# Patient Record
Sex: Female | Born: 1935
Health system: Southern US, Community
[De-identification: ages and names within clinical notes are randomized; demographics above are authoritative.]

## PROBLEM LIST (undated history)

## (undated) DIAGNOSIS — I739 Peripheral vascular disease, unspecified: Secondary | ICD-10-CM

## (undated) DIAGNOSIS — D869 Sarcoidosis, unspecified: Secondary | ICD-10-CM

## (undated) DIAGNOSIS — I700 Atherosclerosis of aorta: Secondary | ICD-10-CM

## (undated) DIAGNOSIS — M170 Bilateral primary osteoarthritis of knee: Secondary | ICD-10-CM

## (undated) DIAGNOSIS — K219 Gastro-esophageal reflux disease without esophagitis: Secondary | ICD-10-CM

## (undated) DIAGNOSIS — G4733 Obstructive sleep apnea (adult) (pediatric): Secondary | ICD-10-CM

## (undated) DIAGNOSIS — I10 Essential (primary) hypertension: Secondary | ICD-10-CM

## (undated) DIAGNOSIS — I509 Heart failure, unspecified: Secondary | ICD-10-CM

## (undated) DIAGNOSIS — F419 Anxiety disorder, unspecified: Secondary | ICD-10-CM

## (undated) DIAGNOSIS — H9190 Unspecified hearing loss, unspecified ear: Secondary | ICD-10-CM

## (undated) DIAGNOSIS — G629 Polyneuropathy, unspecified: Secondary | ICD-10-CM

## (undated) DIAGNOSIS — E041 Nontoxic single thyroid nodule: Secondary | ICD-10-CM

## (undated) DIAGNOSIS — J301 Allergic rhinitis due to pollen: Secondary | ICD-10-CM

## (undated) DIAGNOSIS — I2510 Atherosclerotic heart disease of native coronary artery without angina pectoris: Secondary | ICD-10-CM

## (undated) DIAGNOSIS — R0600 Dyspnea, unspecified: Secondary | ICD-10-CM

## (undated) HISTORY — DX: Allergic rhinitis due to pollen: J30.1

## (undated) HISTORY — DX: Sarcoidosis, unspecified: D86.9

## (undated) HISTORY — DX: Polyneuropathy, unspecified: G62.9

## (undated) HISTORY — DX: Heart failure, unspecified: I50.9

## (undated) HISTORY — DX: Nontoxic single thyroid nodule: E04.1

## (undated) HISTORY — PX: ABDOMINAL HYSTERECTOMY: SHX81

## (undated) HISTORY — DX: Atherosclerotic heart disease of native coronary artery without angina pectoris: I25.10

## (undated) HISTORY — DX: Gastro-esophageal reflux disease without esophagitis: K21.9

## (undated) HISTORY — DX: Atherosclerosis of aorta: I70.0

## (undated) HISTORY — DX: Bilateral primary osteoarthritis of knee: M17.0

## (undated) HISTORY — DX: Obstructive sleep apnea (adult) (pediatric): G47.33

## (undated) HISTORY — DX: Unspecified hearing loss, unspecified ear: H91.90

## (undated) HISTORY — DX: Essential (primary) hypertension: I10

## (undated) HISTORY — PX: BREAST EXCISIONAL BIOPSY: SUR124

## (undated) HISTORY — DX: Peripheral vascular disease, unspecified: I73.9

---

## 1998-12-30 HISTORY — PX: JOINT REPLACEMENT: SHX530

## 2004-12-30 HISTORY — PX: CORONARY ANGIOPLASTY: SHX604

## 2005-01-27 ENCOUNTER — Emergency Department: Payer: Self-pay | Admitting: Emergency Medicine

## 2005-01-31 ENCOUNTER — Ambulatory Visit: Payer: Self-pay

## 2005-05-02 ENCOUNTER — Ambulatory Visit: Payer: Self-pay

## 2005-06-26 ENCOUNTER — Ambulatory Visit: Payer: Self-pay | Admitting: Gastroenterology

## 2005-12-30 HISTORY — PX: JOINT REPLACEMENT: SHX530

## 2006-03-13 ENCOUNTER — Ambulatory Visit: Payer: Self-pay | Admitting: Ophthalmology

## 2006-03-19 ENCOUNTER — Ambulatory Visit: Payer: Self-pay | Admitting: Ophthalmology

## 2006-05-29 ENCOUNTER — Ambulatory Visit: Payer: Self-pay | Admitting: Ophthalmology

## 2006-06-04 ENCOUNTER — Ambulatory Visit: Payer: Self-pay | Admitting: Ophthalmology

## 2006-06-13 ENCOUNTER — Ambulatory Visit: Payer: Self-pay | Admitting: Internal Medicine

## 2006-08-07 ENCOUNTER — Ambulatory Visit: Payer: Self-pay | Admitting: Specialist

## 2007-02-13 ENCOUNTER — Ambulatory Visit: Payer: Self-pay | Admitting: Ophthalmology

## 2007-06-10 ENCOUNTER — Ambulatory Visit: Payer: Self-pay | Admitting: Gastroenterology

## 2008-03-22 ENCOUNTER — Ambulatory Visit: Payer: Self-pay | Admitting: Internal Medicine

## 2008-05-04 ENCOUNTER — Ambulatory Visit: Payer: Self-pay | Admitting: Internal Medicine

## 2008-10-11 ENCOUNTER — Ambulatory Visit: Payer: Self-pay | Admitting: Specialist

## 2008-11-16 ENCOUNTER — Ambulatory Visit: Payer: Self-pay | Admitting: Specialist

## 2008-11-23 ENCOUNTER — Inpatient Hospital Stay: Payer: Self-pay | Admitting: Specialist

## 2009-01-06 ENCOUNTER — Ambulatory Visit: Payer: Self-pay | Admitting: Internal Medicine

## 2009-01-25 ENCOUNTER — Ambulatory Visit: Payer: Self-pay | Admitting: Specialist

## 2009-04-26 ENCOUNTER — Ambulatory Visit: Payer: Self-pay | Admitting: Internal Medicine

## 2009-12-07 ENCOUNTER — Emergency Department: Payer: Self-pay | Admitting: Emergency Medicine

## 2010-03-01 ENCOUNTER — Ambulatory Visit: Payer: Self-pay | Admitting: Internal Medicine

## 2010-07-12 ENCOUNTER — Ambulatory Visit: Payer: Self-pay | Admitting: Internal Medicine

## 2011-04-17 LAB — PULMONARY FUNCTION TEST

## 2011-11-05 ENCOUNTER — Ambulatory Visit: Payer: Self-pay | Admitting: Internal Medicine

## 2011-11-08 ENCOUNTER — Ambulatory Visit: Payer: Self-pay | Admitting: Otolaryngology

## 2011-11-12 ENCOUNTER — Ambulatory Visit: Payer: Self-pay | Admitting: Internal Medicine

## 2011-12-10 ENCOUNTER — Ambulatory Visit: Payer: Self-pay | Admitting: Otolaryngology

## 2011-12-22 ENCOUNTER — Emergency Department: Payer: Self-pay | Admitting: Internal Medicine

## 2012-02-13 ENCOUNTER — Ambulatory Visit: Payer: Self-pay | Admitting: Gastroenterology

## 2012-02-14 LAB — PATHOLOGY REPORT

## 2012-03-03 LAB — PULMONARY FUNCTION TEST

## 2012-05-12 HISTORY — PX: CARDIOVASCULAR STRESS TEST: SHX262

## 2012-07-21 ENCOUNTER — Emergency Department: Payer: Self-pay | Admitting: Emergency Medicine

## 2012-12-17 ENCOUNTER — Emergency Department: Payer: Self-pay | Admitting: Emergency Medicine

## 2012-12-17 LAB — COMPREHENSIVE METABOLIC PANEL
Albumin: 3.7 g/dL (ref 3.4–5.0)
Alkaline Phosphatase: 93 U/L (ref 50–136)
Anion Gap: 6 — ABNORMAL LOW (ref 7–16)
BUN: 10 mg/dL (ref 7–18)
Bilirubin,Total: 0.5 mg/dL (ref 0.2–1.0)
Calcium, Total: 8.8 mg/dL (ref 8.5–10.1)
Chloride: 110 mmol/L — ABNORMAL HIGH (ref 98–107)
Co2: 25 mmol/L (ref 21–32)
Creatinine: 0.91 mg/dL (ref 0.60–1.30)
EGFR (African American): >60
EGFR (Non-African Amer.): 57 — ABNORMAL LOW
Glucose: 122 mg/dL — ABNORMAL HIGH (ref 65–99)
Osmolality: 282 (ref 275–301)
Potassium: 3.8 mmol/L (ref 3.5–5.1)
SGOT(AST): 20 U/L (ref 15–37)
SGPT (ALT): 16 U/L (ref 12–78)
Sodium: 141 mmol/L (ref 136–145)
Total Protein: 7.2 g/dL (ref 6.4–8.2)

## 2012-12-17 LAB — CBC
HCT: 42.9 % (ref 35.0–47.0)
HGB: 14.5 g/dL (ref 12.0–16.0)
MCH: 33.1 pg (ref 26.0–34.0)
MCHC: 33.9 g/dL (ref 32.0–36.0)
MCV: 98 fL (ref 80–100)
Platelet: 188 10*3/uL (ref 150–440)
RBC: 4.4 10*6/uL (ref 3.80–5.20)
RDW: 13.9 % (ref 11.5–14.5)
WBC: 6.3 10*3/uL (ref 3.6–11.0)

## 2012-12-17 LAB — APTT: Activated PTT: 26.4 secs (ref 23.6–35.9)

## 2012-12-17 LAB — TROPONIN I: Troponin-I: <0.02 ng/mL

## 2012-12-30 HISTORY — PX: BIOPSY THYROID: PRO38

## 2013-03-09 ENCOUNTER — Ambulatory Visit: Payer: Self-pay | Admitting: Internal Medicine

## 2013-05-05 ENCOUNTER — Ambulatory Visit: Payer: Self-pay | Admitting: Gastroenterology

## 2013-05-05 LAB — HM COLONOSCOPY

## 2013-05-06 LAB — PATHOLOGY REPORT

## 2013-07-07 ENCOUNTER — Ambulatory Visit: Payer: Self-pay | Admitting: General Practice

## 2013-10-13 ENCOUNTER — Encounter: Payer: Self-pay | Admitting: *Deleted

## 2013-10-27 ENCOUNTER — Ambulatory Visit (INDEPENDENT_AMBULATORY_CARE_PROVIDER_SITE_OTHER): Payer: Medicare Other | Admitting: General Surgery

## 2013-10-27 ENCOUNTER — Ambulatory Visit: Payer: Self-pay | Admitting: General Surgery

## 2013-10-27 ENCOUNTER — Encounter: Payer: Self-pay | Admitting: General Surgery

## 2013-10-27 VITALS — BP 150/80 | HR 70 | Resp 16 | Ht 64.0 in | Wt 216.0 lb

## 2013-10-27 DIAGNOSIS — L723 Sebaceous cyst: Secondary | ICD-10-CM

## 2013-10-27 DIAGNOSIS — L729 Follicular cyst of the skin and subcutaneous tissue, unspecified: Secondary | ICD-10-CM

## 2013-10-27 NOTE — Progress Notes (Signed)
Patient ID: Sheila Long, female   DOB: 1924-03-01, 79 y.o.   MRN: [id number]  Chief Complaint  Patient presents with  . Cyst    right breast/axilla    HPI Sheila Long is a 79 y.o. female.  who presents for an evaluation of a cyst under her right breast/axilla area referred by Dr. Beverely Risen. States she has noticed it for at least 6 months.  It has drained some about one month ago. States it is smaller than what it was one month ago.  The most recent mammogram was done on April 2014.  Patient does perform regular self breast checks and gets regular mammograms done.    HPI  Past Medical History  Diagnosis Date  . Hypertension   . Hard of hearing     Past Surgical History  Procedure Laterality Date  . Joint replacement Left 2000    hip  . Joint replacement Right 2007    hip  . Biopsy thyroid  2014    Dr Andee Poles    History reviewed. No pertinent family history.  Social History History  Substance Use Topics  . Smoking status: Never Smoker   . Smokeless tobacco: Not on file  . Alcohol Use: No    Allergies  Allergen Reactions  . Sulfa Antibiotics     Current Outpatient Prescriptions  Medication Sig Dispense Refill  . aspirin 81 MG tablet Take 81 mg by mouth daily.      . bisoprolol-hydrochlorothiazide (ZIAC) 2.5-6.25 MG per tablet Take 1 tablet by mouth daily.      . cetirizine (ZYRTEC) 10 MG tablet Take 10 mg by mouth daily.      . cholecalciferol (VITAMIN D) 400 UNITS TABS tablet Take 1,000 Units by mouth daily.      . montelukast (SINGULAIR) 10 MG tablet Take 10 mg by mouth at bedtime.      . naproxen sodium (ANAPROX) 220 MG tablet Take 220 mg by mouth as needed.      . ranitidine (ZANTAC) 150 MG tablet Take 150 mg by mouth as needed for heartburn.      . simvastatin (ZOCOR) 20 MG tablet Take 20 mg by mouth at bedtime.      Marland Kitchen ipratropium (ATROVENT) 0.03 % nasal spray        No current facility-administered medications for this visit.    Review  of Systems Review of Systems  Constitutional: Negative.   Respiratory: Negative.   Cardiovascular: Negative.     Blood pressure 150/80, pulse 70, resp. rate 16, height 5\' 4"  (1.626 m), weight 216 lb (97.977 kg).  Physical Exam Physical Exam  Constitutional: She is oriented to person, place, and time. She appears well-developed and well-nourished.  Eyes: Conjunctivae are normal. No scleral icterus.  Neck: Neck supple. Mass (right lobe) present.  Pulmonary/Chest: Right breast exhibits no inverted nipple, no mass, no nipple discharge, no skin change and no tenderness. Left breast exhibits no inverted nipple, no mass, no nipple discharge, no skin change and no tenderness.  1 cm skin cyst right axilla  Lymphadenopathy:    She has no cervical adenopathy.    She has no axillary adenopathy.  Neurological: She is alert and oriented to person, place, and time.  Skin: Skin is warm and dry.    Data Reviewed none  Assessment    Skin cyst right axilla that is not inflamed or infected. Known thyroid nodule that has recently been biopsied by Dr. Andee Poles.    Plan    Monitor  area of concern and no treatment necessary at this time.       Saber Dickerman G 10/28/2013, 6:02 AM

## 2013-10-27 NOTE — Patient Instructions (Addendum)
The patient is aware to call back for any questions or concerns. To call if the cyst becomes painful and red.

## 2013-10-28 ENCOUNTER — Encounter: Payer: Self-pay | Admitting: General Surgery

## 2013-10-28 DIAGNOSIS — L729 Follicular cyst of the skin and subcutaneous tissue, unspecified: Secondary | ICD-10-CM | POA: Insufficient documentation

## 2013-11-19 DIAGNOSIS — E785 Hyperlipidemia, unspecified: Secondary | ICD-10-CM | POA: Insufficient documentation

## 2013-11-19 DIAGNOSIS — E041 Nontoxic single thyroid nodule: Secondary | ICD-10-CM | POA: Insufficient documentation

## 2014-04-26 ENCOUNTER — Ambulatory Visit: Payer: Self-pay | Admitting: Internal Medicine

## 2014-07-05 ENCOUNTER — Ambulatory Visit: Payer: Self-pay

## 2014-10-21 ENCOUNTER — Ambulatory Visit: Payer: Self-pay | Admitting: Internal Medicine

## 2014-10-31 ENCOUNTER — Encounter: Payer: Self-pay | Admitting: General Surgery

## 2015-03-23 ENCOUNTER — Emergency Department: Payer: Self-pay | Admitting: Emergency Medicine

## 2015-03-23 LAB — CBC
HCT: 46 % (ref 35.0–47.0)
HGB: 15.1 g/dL (ref 12.0–16.0)
MCH: 32.7 pg (ref 26.0–34.0)
MCHC: 32.9 g/dL (ref 32.0–36.0)
MCV: 100 fL (ref 80–100)
Platelet: 157 10*3/uL (ref 150–440)
RBC: 4.62 10*6/uL (ref 3.80–5.20)
RDW: 13.8 % (ref 11.5–14.5)
WBC: 5.8 10*3/uL (ref 3.6–11.0)

## 2015-03-23 LAB — PROTIME-INR
INR: 1
Prothrombin Time: 12.9 secs

## 2015-03-23 LAB — BASIC METABOLIC PANEL
Anion Gap: 8 (ref 7–16)
BUN: 17 mg/dL
Calcium, Total: 9.2 mg/dL
Chloride: 107 mmol/L
Co2: 26 mmol/L
Creatinine: 1.05 mg/dL — ABNORMAL HIGH
EGFR (African American): 55 — ABNORMAL LOW
EGFR (Non-African Amer.): 47 — ABNORMAL LOW
Glucose: 126 mg/dL — ABNORMAL HIGH
Potassium: 4 mmol/L
Sodium: 141 mmol/L

## 2015-03-23 LAB — TROPONIN I
Troponin-I: <0.03 ng/mL
Troponin-I: <0.03 ng/mL

## 2015-03-23 LAB — HEPATIC FUNCTION PANEL A (ARMC)
Albumin: 4.2 g/dL
Alkaline Phosphatase: 78 U/L
Bilirubin, Direct: 0.2 mg/dL
Bilirubin,Total: 1.1 mg/dL
Indirect Bilirubin: 0.9
SGOT(AST): 26 U/L
SGPT (ALT): 18 U/L
Total Protein: 7.1 g/dL

## 2015-03-23 LAB — LIPASE, BLOOD: Lipase: 44 U/L

## 2015-03-28 ENCOUNTER — Encounter: Payer: Self-pay | Admitting: Cardiothoracic Surgery

## 2015-04-04 ENCOUNTER — Institutional Professional Consult (permissible substitution) (INDEPENDENT_AMBULATORY_CARE_PROVIDER_SITE_OTHER): Payer: Medicare Other | Admitting: Surgery

## 2015-04-04 ENCOUNTER — Encounter: Payer: Self-pay | Admitting: Surgery

## 2015-04-04 VITALS — BP 151/87 | HR 82 | Resp 16 | Ht 64.0 in | Wt 210.0 lb

## 2015-04-04 DIAGNOSIS — I712 Thoracic aortic aneurysm, without rupture, unspecified: Secondary | ICD-10-CM

## 2015-04-04 NOTE — Progress Notes (Signed)
Cardiothoracic Surgery Consultation  PCP is Welton FlakesKHAN, Sheila HarperFOZIA M, MD Referring Provider is Governor RooksLord, Rebecca, MD,  [facility] Cardiologist: Jamse MeadAlex Paraschos, MD  Chief Complaint  Patient presents with  . TAA    eval..Marland Kitchen.CTA CHEST [facility] 03/23/15    HPI:  The patient is an [age] year old woman with hypertension and coronary disease s/p PCI with stent about 12 years ago in [place] while she was living there. [place] years ago to care for her mother who has since died at age [id number]. The patient reports waking on 03/23/2015 with burning substernal chest discomfort that she thought was heart burn. She took an antacid without relief and then [facility]. Her work up there included normal cardiac enzymes, a nonspecific ECG, and otherwise normal laboratory studies. Her pain resolved in the ER and she has had no recurrence. She had a CTA of the chest that showed  incidental 4.2 cm fusiform aneurysmal enlargement of the ascending aorta.  Past Medical History  Diagnosis Date  . Hypertension   . Hard of hearing     Past Surgical History  Procedure Laterality Date  . Joint replacement Left 2000    hip  . Joint replacement Right 2007    hip  . Biopsy thyroid  2014    Dr Andee PolesVaught    History reviewed. No pertinent family history.  Social History History  Substance Use Topics  . Smoking status: Never Smoker   . Smokeless tobacco: Not on file  . Alcohol Use: No    Current Outpatient Prescriptions  Medication Sig Dispense Refill  . amLODipine (NORVASC) 2.5 MG tablet Take 2.5 mg by mouth daily.    Marland Kitchen. aspirin 81 MG tablet Take 81 mg by mouth daily.    . cholecalciferol (VITAMIN D) 400 UNITS TABS tablet Take 1,000 Units by mouth daily.    Marland Kitchen. ipratropium (ATROVENT) 0.03 % nasal spray     . naproxen sodium (ANAPROX) 220 MG tablet Take 220 mg by mouth as needed.    . pantoprazole (PROTONIX) 20 MG tablet Take 20 mg by mouth 2 (two) times daily.    . ranitidine (ZANTAC) 150  MG tablet Take 150 mg by mouth 2 (two) times daily.     . montelukast (SINGULAIR) 10 MG tablet Take 10 mg by mouth at bedtime.     No current facility-administered medications for this visit.    Allergies  Allergen Reactions  . Sulfa Antibiotics   . Zocor [Simvastatin] Other (See Comments)    INTOLERANCE-MYALGIAS     Review of Systems  Constitutional: Positive for fatigue.  HENT: Negative.   Eyes:       Frequent blurred vision. Has seen her eye doctor and no abnormality found.  Respiratory: Positive for shortness of breath.        Brings up a lot of mucous  Cardiovascular: Negative for palpitations and leg swelling.       Occasional episodes of mild chest discomfort.  Gastrointestinal: Negative.   Endocrine: Negative.   Genitourinary: Negative.   Musculoskeletal:       Pain in both thighs related to her prior hip replacements.  Skin: Negative.   Allergic/Immunologic: Negative.   Neurological: Positive for dizziness.       Numbness and pain in right calf with ambulation and at rest  Hematological: Negative.   Psychiatric/Behavioral: Negative.     BP 151/87 mmHg  Pulse 82  Resp 16  Ht 5'  4" (1.626 Long)  Wt 210 lb (95.255 kg)  BMI 36.03 kg/m2  SpO2 97% Physical Exam  Constitutional: She is oriented to person, place, and time. She appears well-developed and well-nourished. No distress.  HENT:  Head: Normocephalic and atraumatic.  Mouth/Throat: Oropharynx is clear and moist.  Eyes: EOM are normal. Pupils are equal, round, and reactive to light.  Neck: Normal range of motion. Neck supple. No JVD present. No thyromegaly present.  Cardiovascular: Normal rate, regular rhythm, normal heart sounds and intact distal pulses.   No murmur heard. dp pulse normal bilat  Pulmonary/Chest: Effort normal and breath sounds normal. No respiratory distress. She has no rales. She exhibits no tenderness.  Abdominal: Soft. Bowel sounds are normal. She exhibits no distension. There is no  tenderness.  Musculoskeletal: Normal range of motion. She exhibits no edema.  Lymphadenopathy:    She has no cervical adenopathy.  Neurological: She is alert and oriented to person, place, and time. She has normal strength. No sensory deficit.  Skin: Skin is warm and dry.  Psychiatric: She has a normal mood and affect.     Diagnostic Tests:  CTA of the chest from [facility] dated 03/23/2015 was personally reviewed by me.    Impression:  She has an incidental 4.2 cm fusiform ascending aortic aneurysm of uncertain duration. I doubt that this is responsible for the chest pain that she had on presentation. It does not require any treatment at this time other than good blood pressure control. The generally accepted criteria for recommending surgical treatment is for aneurysms of 5.5 cm or greater or if there is a period of rapid enlargement considered to be 5 mm over a one year period. Given her advanced age I would be very conservative and I doubt that she will ever need anything done for this. I have recommended repeating the scan in one year to determine if this is stable and if so then she may not require any further follow up.   Plan:  I will see her back in one year with a CTA of the chest.   Alleen Borne, MD [facility] [phone]

## 2015-08-15 ENCOUNTER — Encounter: Payer: Self-pay | Admitting: Emergency Medicine

## 2015-08-15 ENCOUNTER — Other Ambulatory Visit: Payer: Self-pay

## 2015-08-15 ENCOUNTER — Emergency Department
Admission: EM | Admit: 2015-08-15 | Discharge: 2015-08-15 | Disposition: A | Discharge status: Home / Self Care | Payer: Medicare Other | Attending: Emergency Medicine | Admitting: Emergency Medicine

## 2015-08-15 DIAGNOSIS — M79661 Pain in right lower leg: Secondary | ICD-10-CM | POA: Insufficient documentation

## 2015-08-15 DIAGNOSIS — Z79899 Other long term (current) drug therapy: Secondary | ICD-10-CM | POA: Diagnosis not present

## 2015-08-15 DIAGNOSIS — I10 Essential (primary) hypertension: Secondary | ICD-10-CM | POA: Diagnosis not present

## 2015-08-15 DIAGNOSIS — M79662 Pain in left lower leg: Secondary | ICD-10-CM | POA: Insufficient documentation

## 2015-08-15 DIAGNOSIS — G8929 Other chronic pain: Secondary | ICD-10-CM | POA: Insufficient documentation

## 2015-08-15 DIAGNOSIS — Z7982 Long term (current) use of aspirin: Secondary | ICD-10-CM | POA: Insufficient documentation

## 2015-08-15 LAB — BASIC METABOLIC PANEL
Anion gap: 7 (ref 5–15)
BUN: 15 mg/dL (ref 6–20)
CO2: 28 mmol/L (ref 22–32)
Calcium: 9.2 mg/dL (ref 8.9–10.3)
Chloride: 106 mmol/L (ref 101–111)
Creatinine, Ser: 0.98 mg/dL (ref 0.44–1.00)
GFR calc Af Amer: 58 mL/min — ABNORMAL LOW (ref >60)
GFR calc non Af Amer: 50 mL/min — ABNORMAL LOW (ref >60)
Glucose, Bld: 104 mg/dL — ABNORMAL HIGH (ref 65–99)
Potassium: 4.3 mmol/L (ref 3.5–5.1)
Sodium: 141 mmol/L (ref 135–145)

## 2015-08-15 LAB — CBC
HCT: 43.1 % (ref 35.0–47.0)
Hemoglobin: 14.3 g/dL (ref 12.0–16.0)
MCH: 32.6 pg (ref 26.0–34.0)
MCHC: 33.2 g/dL (ref 32.0–36.0)
MCV: 98.2 fL (ref 80.0–100.0)
Platelets: 179 10*3/uL (ref 150–440)
RBC: 4.39 MIL/uL (ref 3.80–5.20)
RDW: 14.1 % (ref 11.5–14.5)
WBC: 7.1 10*3/uL (ref 3.6–11.0)

## 2015-08-15 LAB — TROPONIN I: Troponin I: <0.03 ng/mL (ref <0.031)

## 2015-08-15 NOTE — Discharge Instructions (Signed)
As we discussed, though you do have high blood pressure (hypertension), fortunately it is not immediately dangerous at this time and does not need emergency intervention or admission to the hospital.  If we add to or change your regular medications, we may cause more harm than good - it is more appropriate for your primary care doctor to evaluate you in clinic and decide if any medication changes are needed.  Please follow up in clinic as recommended in these papers.  Return to the Emergency Department (ED) if you experience any chest pain/pressure/tightness, difficulty breathing, or sudden sweating, or other symptoms that concern you.   Hypertension Hypertension is another name for high blood pressure. High blood pressure forces your heart to work harder to pump blood. A blood pressure reading has two numbers, which includes a higher number over a lower number (example: 110/72). HOME CARE   Have your blood pressure rechecked by your doctor.  Only take medicine as told by your doctor. Follow the directions carefully. The medicine does not work as well if you skip doses. Skipping doses also puts you at risk for problems.  Do not smoke.  Monitor your blood pressure at home as told by your doctor. GET HELP IF:  You think you are having a reaction to the medicine you are taking.  You have repeat headaches or feel dizzy.  You have puffiness (swelling) in your ankles.  You have trouble with your vision. GET HELP RIGHT AWAY IF:   You get a very bad headache and are confused.  You feel weak, numb, or faint.  You get chest or belly (abdominal) pain.  You throw up (vomit).  You cannot breathe very well. MAKE SURE YOU:   Understand these instructions.  Will watch your condition.  Will get help right away if you are not doing well or get worse. Document Released: 06/03/2008 Document Revised: 12/21/2013 Document Reviewed: 10/08/2013 [facility] Patient Information 2015 [place], [place].  This information is not intended to replace advice given to you by your health care provider. Make sure you discuss any questions you have with your health care provider.  Managing Your High Blood Pressure Blood pressure is a measurement of how forceful your blood is pressing against the walls of the arteries. Arteries are muscular tubes within the circulatory system. Blood pressure does not stay the same. Blood pressure rises when you are active, excited, or nervous; and it lowers during sleep and relaxation. If the numbers measuring your blood pressure stay above normal most of the time, you are at risk for health problems. High blood pressure (hypertension) is a long-term (chronic) condition in which blood pressure is elevated. A blood pressure reading is recorded as two numbers, such as 120 over 80 (or 120/80). The first, higher number is called the systolic pressure. It is a measure of the pressure in your arteries as the heart beats. The second, lower number is called the diastolic pressure. It is a measure of the pressure in your arteries as the heart relaxes between beats.  Keeping your blood pressure in a normal range is important to your overall health and prevention of health problems, such as heart disease and stroke. When your blood pressure is uncontrolled, your heart has to work harder than normal. High blood pressure is a very common condition in adults because blood pressure tends to rise with age. Men and women are equally likely to have hypertension but at different times in life. Before age 22, men are more likely to  have hypertension. After 79 years of age, women are more likely to have it. Hypertension is especially common in African Americans. This condition often has no signs or symptoms. The cause of the condition is usually not known. Your caregiver can help you come up with a plan to keep your blood pressure in a normal, healthy range. BLOOD PRESSURE STAGES Blood pressure is  classified into four stages: normal, prehypertension, stage 1, and stage 2. Your blood pressure reading will be used to determine what type of treatment, if any, is necessary. Appropriate treatment options are tied to these four stages:  Normal  Systolic pressure (mm Hg): below 120.  Diastolic pressure (mm Hg): below 80. Prehypertension  Systolic pressure (mm Hg): 120 to 139.  Diastolic pressure (mm Hg): 80 to 89. Stage1  Systolic pressure (mm Hg): 140 to 159.  Diastolic pressure (mm Hg): 90 to 99. Stage2  Systolic pressure (mm Hg): 160 or above.  Diastolic pressure (mm Hg): 100 or above. RISKS RELATED TO HIGH BLOOD PRESSURE Managing your blood pressure is an important responsibility. Uncontrolled high blood pressure can lead to:  A heart attack.  A stroke.  A weakened blood vessel (aneurysm).  Heart failure.  Kidney damage.  Eye damage.  Metabolic syndrome.  Memory and concentration problems. HOW TO MANAGE YOUR BLOOD PRESSURE Blood pressure can be managed effectively with lifestyle changes and medicines (if needed). Your caregiver will help you come up with a plan to bring your blood pressure within a normal range. Your plan should include the following: Education  Read all information provided by your caregivers about how to control blood pressure.  Educate yourself on the latest guidelines and treatment recommendations. New research is always being done to further define the risks and treatments for high blood pressure. Lifestylechanges  Control your weight.  Avoid smoking.  Stay physically active.  Reduce the amount of salt in your diet.  Reduce stress.  Control any chronic conditions, such as high cholesterol or diabetes.  Reduce your alcohol intake. Medicines  Several medicines (antihypertensive medicines) are available, if needed, to bring blood pressure within a normal range. Communication  Review all the medicines you take with your  caregiver because there may be side effects or interactions.  Talk with your caregiver about your diet, exercise habits, and other lifestyle factors that may be contributing to high blood pressure.  See your caregiver regularly. Your caregiver can help you create and adjust your plan for managing high blood pressure. RECOMMENDATIONS FOR TREATMENT AND FOLLOW-UP  The following recommendations are based on current guidelines for managing high blood pressure in nonpregnant adults. Use these recommendations to identify the proper follow-up period or treatment option based on your blood pressure reading. You can discuss these options with your caregiver.  Systolic pressure of 120 to 139 or diastolic pressure of 80 to 89: Follow up with your caregiver as directed.  Systolic pressure of 140 to 160 or diastolic pressure of 90 to 100: Follow up with your caregiver within 2 months.  Systolic pressure above 160 or diastolic pressure above 100: Follow up with your caregiver within 1 month.  Systolic pressure above 180 or diastolic pressure above 110: Consider antihypertensive therapy; follow up with your caregiver within 1 week.  Systolic pressure above 200 or diastolic pressure above 120: Begin antihypertensive therapy; follow up with your caregiver within 1 week. Document Released: 09/09/2012 Document Reviewed: 09/09/2012 [facility] Patient Information 2015 [place], [place]. This information is not intended to replace advice given to you  by your health care provider. Make sure you discuss any questions you have with your health care provider. ° °

## 2015-08-15 NOTE — ED Notes (Signed)
MD at bedside to talk to pt about importance of primary care follow-up and treatment. of HTN. Pt informed of dangers of dropping BP too quickly.

## 2015-08-15 NOTE — ED Provider Notes (Signed)
[facility] Emergency Department Provider Note  ____________________________________________  Time seen: Approximately 8:30 PM  I have reviewed the triage vital signs and the nursing notes.   HISTORY  Chief Complaint Hypertension    HPI Sheila Long is a 81 y.o. female with a history of vascular disease and hypertension who presents from vascular clinic where she says that Dr. Wyn Quaker sent her to the Emergency Department because of her high blood pressure.  She has been working with Dr. Juel Burrow on her medications and recently increased her metoprolol.  She states that Dr. Wyn Quaker ordered an ultrasound for her legs because he is worried that she has a blood clot in her leg and that he sent her to the emergency department because of her high blood pressure today.  She denies any complaints associated with a high blood pressure.  Reportedly she told the triage nurse about blurred vision headaches but she mentioned none of this to me.  She is not having any pain in her legs at this time.  This history was confusing so I called Dr. dew and spoke with him by phone about the situation.  He knows the patient well and did see her today in clinic.  However, he reports that the patient was quite concerned about her blood pressure and he encouraged her to follow up with her primary care doctor and explained that her blood pressure is not dangerous at this time.  He explained to her that this is a long-term medical management issue and that her primary care doctor's the best one to do this.  He advised against her going to the emergency department when she brought this up but did point out that she can certainly do that she feels is necessary.  Reportedly she then went to the urgent care who sent her to the emergency department for further evaluation of her hypertension.   Past Medical History  Diagnosis Date  . Hypertension   . Hard of hearing     Patient Active Problem List    Diagnosis Date Noted  . Skin cyst right axilla 10/28/2013    Past Surgical History  Procedure Laterality Date  . Joint replacement Left 2000    hip  . Joint replacement Right 2007    hip  . Biopsy thyroid  2014    Dr Andee Poles    Current Outpatient Rx  Name  Route  Sig  Dispense  Refill  . amLODipine (NORVASC) 2.5 MG tablet   Oral   Take 2.5 mg by mouth daily.         Marland Kitchen aspirin 81 MG tablet   Oral   Take 81 mg by mouth daily.         . cholecalciferol (VITAMIN D) 400 UNITS TABS tablet   Oral   Take 1,000 Units by mouth daily.         Marland Kitchen ipratropium (ATROVENT) 0.03 % nasal spray               . montelukast (SINGULAIR) 10 MG tablet   Oral   Take 10 mg by mouth at bedtime.         . naproxen sodium (ANAPROX) 220 MG tablet   Oral   Take 220 mg by mouth as needed.         . pantoprazole (PROTONIX) 20 MG tablet   Oral   Take 20 mg by mouth 2 (two) times daily.         . ranitidine (ZANTAC)  150 MG tablet   Oral   Take 150 mg by mouth 2 (two) times daily.            Allergies Sulfa antibiotics and Zocor  No family history on file.  Social History Social History  Substance Use Topics  . Smoking status: Never Smoker   . Smokeless tobacco: None  . Alcohol Use: No    Review of Systems Constitutional: No fever/chills Eyes: No visual changes. ENT: No sore throat. Cardiovascular: Denies chest pain. Respiratory: Denies shortness of breath. Gastrointestinal: No abdominal pain.  No nausea, no vomiting.  No diarrhea.  No constipation. Genitourinary: Negative for dysuria. Musculoskeletal: Negative for back pain.  Chronic pain in bilateral lower extremities, worse on the right.  No edema. Skin: Negative for rash. Neurological: Negative for headaches, focal weakness or numbness.  10-point ROS otherwise negative.  ____________________________________________   PHYSICAL EXAM:  VITAL SIGNS: ED Triage Vitals  Enc Vitals Group     BP 08/15/15  1718 162/70 mmHg     Pulse Rate 08/15/15 1718 63     Resp 08/15/15 1718 18     Temp 08/15/15 1718 97.8 F (36.6 C)     Temp Source 08/15/15 1718 Oral     SpO2 08/15/15 1718 98 %     Weight 08/15/15 1718 219 lb (99.338 kg)     Height 08/15/15 1718 5\' 4"  (1.626 m)     Head Cir --      Peak Flow --      Pain Score --      Pain Loc --      Pain Edu? --      Excl. in GC? --     Constitutional: Alert and oriented. Well appearing and in no acute distress. Eyes: Conjunctivae are normal. PERRL. EOMI. Head: Atraumatic. Nose: No congestion/rhinnorhea. Mouth/Throat: Mucous membranes are moist.  Oropharynx non-erythematous. Neck: No stridor.   Cardiovascular: Normal rate, regular rhythm. Grossly normal heart sounds.  Good peripheral circulation. Respiratory: Normal respiratory effort.  No retractions. Lungs CTAB. Gastrointestinal: Soft and nontender. No distention. No abdominal bruits. No CVA tenderness. Musculoskeletal: No lower extremity tenderness nor edema.  She has no popliteal tenderness on the right and normal capillary refill in both of her lower extremities.  Both her lower extremities are warm down to the foot.  No joint effusions. Neurologic:  Normal speech and language. No gross focal neurologic deficits are appreciated.  Skin:  Skin is warm, dry and intact. No rash noted. Psychiatric: Mood and affect are normal. Speech and behavior are normal.  ____________________________________________   LABS (all labs ordered are listed, but only abnormal results are displayed)  Labs Reviewed  BASIC METABOLIC PANEL - Abnormal; Notable for the following:    Glucose, Bld 104 (*)    GFR calc non Af Amer 50 (*)    GFR calc Af Amer 58 (*)    All other components within normal limits  CBC  TROPONIN I   ____________________________________________  EKG  ED ECG REPORT I, Lincy Belles, the attending physician, personally viewed and interpreted this ECG.  Date: 08/15/2015 EKG Time:  17:33 Rate: 62 Rhythm: normal sinus rhythm QRS Axis: normal Intervals: normal ST/T Wave abnormalities: Inverted T-wave in lead 3, otherwise no concerning EKG findings or evidence of acute ischemia Conduction Disutrbances: none Narrative Interpretation: unremarkable  ____________________________________________  RADIOLOGY Not indicated ____________________________________________   PROCEDURES  Procedure(s) performed: None  Critical Care performed: No ____________________________________________   INITIAL IMPRESSION / ASSESSMENT AND PLAN / ED  COURSE  Pertinent labs & imaging results that were available during my care of the patient were reviewed by me and considered in my medical decision making (see chart for details).  Prior to speaking with Dr. Wyn Quaker, I had already explained to the patient that her lab work was unremarkable with no evidence of end organ dysfunction.  I had further explained that I would be worried about dramatically and rapidly lowering asymptomatic high blood pressure because I could actually cause a CVA.  When I spoke with Dr. dew by phone and he agreed with my concerns and reiterated his recommendation that she follow up as an outpatient with her primary care doctor.  I returned to the room and went over all of these expirations and recommendations with the patient again.  Her blood pressures consistently elevated but not dangerously so and I believe it is most appropriate that this be managed as an outpatient.  She is confused about her medications at this time so I am reluctant to make any changes and I explained why her doctor should be "quarterback" that manages all of her changes.  She understands and agrees and knows that she should follow-up with Dr. Juel Burrow at the next available opportunity and with Dr. dew as scheduled.  ____________________________________________  FINAL CLINICAL IMPRESSION(S) / ED DIAGNOSES  Final diagnoses:  Chronic hypertension       NEW MEDICATIONS STARTED DURING THIS VISIT:  New Prescriptions   No medications on file     Loleta Rose, MD 08/15/15 2105

## 2015-08-15 NOTE — ED Notes (Signed)
Pt presents to ed with c/o blurred vision and headaches. Pt with hx of HTN and takes meds for same. Pt denies any chest pain.

## 2015-08-23 ENCOUNTER — Ambulatory Visit
Admission: RE | Admit: 2015-08-23 | Discharge: 2015-08-23 | Disposition: A | Discharge status: Home / Self Care | Payer: Medicare Other | Source: Ambulatory Visit | Attending: Internal Medicine | Admitting: Internal Medicine

## 2015-08-23 ENCOUNTER — Other Ambulatory Visit: Payer: Self-pay | Admitting: Internal Medicine

## 2015-08-23 DIAGNOSIS — M545 Low back pain: Secondary | ICD-10-CM | POA: Diagnosis present

## 2015-08-23 DIAGNOSIS — D869 Sarcoidosis, unspecified: Secondary | ICD-10-CM

## 2015-08-23 DIAGNOSIS — M5136 Other intervertebral disc degeneration, lumbar region: Secondary | ICD-10-CM | POA: Insufficient documentation

## 2015-08-23 DIAGNOSIS — M5440 Lumbago with sciatica, unspecified side: Secondary | ICD-10-CM

## 2015-08-23 DIAGNOSIS — M8580 Other specified disorders of bone density and structure, unspecified site: Secondary | ICD-10-CM | POA: Diagnosis not present

## 2015-09-19 ENCOUNTER — Ambulatory Visit: Payer: Medicare Other | Attending: Internal Medicine

## 2015-09-19 DIAGNOSIS — R2681 Unsteadiness on feet: Secondary | ICD-10-CM | POA: Insufficient documentation

## 2015-09-19 DIAGNOSIS — R531 Weakness: Secondary | ICD-10-CM | POA: Insufficient documentation

## 2015-09-20 ENCOUNTER — Other Ambulatory Visit: Payer: Self-pay | Admitting: Internal Medicine

## 2015-09-20 DIAGNOSIS — I712 Thoracic aortic aneurysm, without rupture, unspecified: Secondary | ICD-10-CM | POA: Insufficient documentation

## 2015-09-20 DIAGNOSIS — Z1231 Encounter for screening mammogram for malignant neoplasm of breast: Secondary | ICD-10-CM

## 2015-09-20 NOTE — Therapy (Signed)
[facility] MAIN [facility] SERVICES [address] [place], [place], [address] Phone: [phone]   Fax:  [phone]  Physical Therapy Evaluation  Patient Details  Name: Sheila Long MRN: [id number] Date of Birth: [birth year] Referring Provider:  Lyndon Code, MD  Encounter Date: 09/19/2015    Past Medical History  Diagnosis Date  . Hypertension   . Hard of hearing     Past Surgical History  Procedure Laterality Date  . Joint replacement Left 2000    hip  . Joint replacement Right 2007    hip  . Biopsy thyroid  2014    Dr Andee Poles    There were no vitals filed for this visit.  Visit Diagnosis:  Unsteadiness on feet - Plan: PT plan of care cert/re-cert  Weakness - Plan: PT plan of care cert/re-cert      Subjective Assessment - 09/21/15 1834    Subjective Pt is here because her doctor sent to PT for her "legs."  pt also is concerned about her balance and shortness of breath.  She has pain in her mid-thigh and attributes this to the hardware from the bilateral THA she had in the past.  Pt only has pain in her thighs when she walks and sitting helps relieve the pain quickly.  She also has occasional lower leg pain and thinks it is due to her decreased blood flow to her extremities.  Pt denies any falls in the past 6 months and started using single point less than 1 year ago due to decreased balance.  On Monday 8/19 pt had imaging on her LEs to check for blood clots but came back negative and only has poor circulation   Limitations Walking   Patient Stated Goals pt wants to walk with improved balance            [facility] PT Assessment - 09/21/15 0001    Assessment   Medical Diagnosis OA/Difficulty walking   Hand Dominance Right   Prior Therapy none   Precautions   Precautions Fall   Precaution Comments thoracic aortic aneurysum   Restrictions   Weight Bearing Restrictions No   Home Environment   Living Environment Private  residence   Living Arrangements Alone   Type of Home House   Home Access Stairs to enter   Entrance Stairs-Number of Steps 3   Entrance Stairs-Rails Right   Home Layout Laundry or work area in basement   Home Equipment [place] - single point;Grab bars - toilet;Grab bars - tub/shower   Prior Function   Level of Independence Independent   Vocation Retired   Leisure dance   Cognition   Overall Cognitive Status Within Functional Limits for tasks assessed   Sensation   Light Touch Appears Intact   Coordination   Gross Motor Movements are Fluid and Coordinated Yes         AROM: Bilateral LEs WFL   STRENGTH:  Graded on a 0-5 scale Muscle Group Left Right  Hip Flex 4+ 4+  Hip Abd    Hip Add    Hip Ext    Knee ext 5 5  Knee Flex 4+ 4+  Ankle in/ev 5 5  Ankle DF 5 5  Ankle PF     SENSATION: LEs WNL  Special Test: Homan's= negative bilaterally   BALANCE: Pt displays decreased dynamic balance, especially during single leg phase    GAIT: Pt ambulates with single point cane in R UE, has short stride length, minimal trunk  rotation and arm swing  OUTCOME MEASURES: TEST Outcome Interpretation  5 times sit<>stand 18.5 sec without UE assist  >79 yo, >15 sec indicates increased risk for falls  10 meter walk test              0.7   m/s <1.0 m/s indicates increased risk for falls; limited community ambulator  Timed up and Go  22. Sec with cane 21.82 sec without cane <14 sec indicates increased risk for falls  6 minute walk test                Feet 1000 feet is community Financial controller               44/56 <36/56 (100% risk for falls), 37-45 (80% risk for falls); 46-51 (>50% risk for falls); 52-55 (lower risk <25% of falls)                              PT Long Term Goals - 09/20/15 0824    PT LONG TERM GOAL #1   Title pt's Berg balance score will improve by at least 8 points indicating decreased fall risk    Baseline 44/56 on eval     Time 4   Period Weeks   Status New   PT LONG TERM GOAL #2   Title pt's 5x sit to stand will be less than 11 sec for average time for her age indicating improved functional LE strength    Baseline 18.5 sec on eval    Time 4   Period Weeks   Status New   PT LONG TERM GOAL #3   Title pt's gait speed will be greater than 0.8 m/s with LRAD for safe community ambulation    Baseline 0.7 m/s on eval    Time 4   Period Weeks   Status New               Plan - 07-Oct-2015 1832    Clinical Impression Statement pt is a pleasant 81 year old female with chief complaint of pain in bilateral mid-thigh, decreased balance and endurance.  Based on her evaluation today, she is at risk for falls and has decreased LE functional strength.  At the end of the session, pt noted decreased pain in her LEs compared to when she first arrived.  Pt displays decreased dynamic balance versus static especially during single leg stance.  Pt would benefit from skilled PT services to improve her impairments, decrease her risk of falls and improve functional activity tolerance.        Pt will benefit from skilled therapeutic intervention in order to improve on the following deficits Decreased strength;Decreased balance;Pain;Decreased activity tolerance;Decreased endurance;Difficulty walking   Rehab Potential Good   Clinical Impairments Affecting Rehab Potential thoracic aortic aneurysum, HTN   PT Frequency 2x / week   PT Duration 4 weeks   PT Treatment/Interventions Aquatic Therapy;Moist Heat;Therapeutic exercise;Cryotherapy;Therapeutic activities;Functional mobility training;Stair training;Gait training;Balance training;Neuromuscular re-education;Patient/family education;Manual techniques   PT Next Visit Plan 6 min walk test           G-Codes - Oct 07, 2015 1828    Functional Limitation Mobility: Walking and moving around   Mobility: Walking and Moving Around Current Status (J1914) At least 20 percent but less than 40  percent impaired, limited or restricted   Mobility: Walking and Moving Around Goal Status (N8295) At least 1 percent but less than 20 percent impaired,  limited or restricted       Problem List Patient Active Problem List   Diagnosis Date Noted  . TAA (Thoracic aortic aneurysm) without rupture 09/20/2015  . Skin cyst right axilla 10/28/2013   Janus Molder, SPT This entire session was performed under direct supervision and direction of a licensed therapist/therapist assistant . I have personally read, edited and approve of the note as written. Carlyon Shadow. Tortorici, PT, DPT [phone]  Tortorici,Ashley 09/21/2015, 6:34 PM  [place] [facility] MAIN [facility] SERVICES [address] [place], [place], [address] Phone: [phone]   Fax:  [phone]

## 2015-09-26 ENCOUNTER — Ambulatory Visit: Payer: Medicare Other

## 2015-09-26 DIAGNOSIS — R531 Weakness: Secondary | ICD-10-CM

## 2015-09-26 DIAGNOSIS — R2681 Unsteadiness on feet: Secondary | ICD-10-CM

## 2015-09-26 NOTE — Therapy (Signed)
[facility] MAIN [facility] SERVICES [address] [place], [place], [address] Phone: [phone]   Fax:  [phone]  Physical Therapy Treatment  Patient Details  Name: Unity Luepke MRN: [id number] Date of Birth: 1924/05/20 Referring Provider:  Lyndon Code, MD  Encounter Date: 09/26/2015      PT End of Session - 09/27/15 0820    Visit Number 2   Number of Visits 9   Date for PT Re-Evaluation 10/17/15   Authorization Type 2/10 g codes   PT Start Time 1625   PT Stop Time 1710   PT Time Calculation (min) 45 min   Equipment Utilized During Treatment Gait belt   Activity Tolerance Patient tolerated treatment well   Behavior During Therapy [facility] for tasks assessed/performed      Past Medical History  Diagnosis Date  . Hypertension   . Hard of hearing     Past Surgical History  Procedure Laterality Date  . Joint replacement Left 2000    hip  . Joint replacement Right 2007    hip  . Biopsy thyroid  2014    Dr Andee Poles    There were no vitals filed for this visit.  Visit Diagnosis:  Unsteadiness on feet  Weakness      Subjective Assessment - 09/27/15 0820    Subjective pt says she felt "pretty good" for the first two days after her evaluation. Pt denies any pain currently.     Limitations Walking   Patient Stated Goals pt wants to walk with improved balance   Currently in Pain? No/denies         there ex: nustep x 4 min (no charge) level 1 Bilateral leg press with 60# 2x10 Heel raises on leg press machine with 60# x10 Sit to stand x10 without UE assist Bridges x10, pt required verbal cueing to push through heels for gluteal activation  Supine clamshells with red band x10  Side stepping with yellow band above knees x 4 laps in //bars Standing hip extension with yellow band x10 each LE Squats in //bars x10 Squats on AIREX in //bars x10 Side step up on 4 inch step x10 each LE Heel raises in //bars 2x10   Neuro  re-ed: Marching in place with occasional UE assist in //bars Static standing with one foot on 6 inch step 3x 10 sec each LE Marching in //bars in instruction to maintain SLS as long as possible, pt required min UE assist to maintain balance x 1 lap  Pt required verbal and visual cues for correct exercise technique.   Pt required close supervision-CGA throughout session                         PT Education - 09/27/15 0820    Education provided Yes   Education Details plan of care   Person(s) Educated Patient   Methods Explanation   Comprehension Verbalized understanding             PT Long Term Goals - 09/20/15 0824    PT LONG TERM GOAL #1   Title pt's Berg balance score will improve by at least 8 points indicating decreased fall risk    Baseline 44/56 on eval    Time 4   Period Weeks   Status New   PT LONG TERM GOAL #2   Title pt's 5x sit to stand will be less than 11 sec for average time for her age indicating improved  functional LE strength    Baseline 18.5 sec on eval    Time 4   Period Weeks   Status New   PT LONG TERM GOAL #3   Title pt's gait speed will be greater than 0.8 m/s with LRAD for safe community ambulation    Baseline 0.7 m/s on eval    Time 4   Period Weeks   Status New               Plan - 09/27/15 1610    Clinical Impression Statement pt did really well today and did not experience an increase in pain in her mid-thighs throughout the session.  pt was able to ambulate in the gym without use of an assistive device and without any LOB.  pt noted relief in her legs at the end of the session compared to when she first arrived to the facility   Pt will benefit from skilled therapeutic intervention in order to improve on the following deficits Decreased strength;Decreased balance;Pain;Decreased activity tolerance;Decreased endurance;Difficulty walking   Rehab Potential Good   Clinical Impairments Affecting Rehab Potential thoracic  aortic aneurysum, HTN   PT Frequency 2x / week   PT Duration 4 weeks   PT Treatment/Interventions Aquatic Therapy;Moist Heat;Therapeutic exercise;Cryotherapy;Therapeutic activities;Functional mobility training;Stair training;Gait training;Balance training;Neuromuscular re-education;Patient/family education;Manual techniques   PT Next Visit Plan 6 min walk test         Problem List Patient Active Problem List   Diagnosis Date Noted  . TAA (Thoracic aortic aneurysm) without rupture 09/20/2015  . Skin cyst right axilla 10/28/2013   Janus Molder, SPT This entire session was performed under direct supervision and direction of a licensed therapist/therapist assistant . I have personally read, edited and approve of the note as written. Carlyon Shadow. Tortorici, PT, DPT [phone] Marland Kitchen Tortorici,Ashley 09/27/2015, 2:08 PM  [place] [facility] MAIN [facility] SERVICES [address] [place], [place], [address] Phone: [phone]   Fax:  [phone]

## 2015-09-28 ENCOUNTER — Ambulatory Visit: Payer: Medicare Other

## 2015-09-28 DIAGNOSIS — R2681 Unsteadiness on feet: Secondary | ICD-10-CM

## 2015-09-28 DIAGNOSIS — R531 Weakness: Secondary | ICD-10-CM

## 2015-09-28 NOTE — Therapy (Signed)
[facility] MAIN [facility] SERVICES [address] [place], [place], [address] Phone: [phone]   Fax:  [phone]  Physical Therapy Treatment  Patient Details  Name: Sheila Long MRN: [id number] Date of Birth: 1924/11/15 Referring Provider:  Lyndon Code, MD  Encounter Date: 09/28/2015      PT End of Session - 09/28/15 1756    Visit Number 3   Number of Visits 9   Date for PT Re-Evaluation 10/17/15   Authorization Type 3/10 g codes   PT Start Time 1630   PT Stop Time 1715   PT Time Calculation (min) 45 min   Equipment Utilized During Treatment Gait belt   Activity Tolerance Patient tolerated treatment well   Behavior During Therapy [facility] for tasks assessed/performed      Past Medical History  Diagnosis Date  . Hypertension   . Hard of hearing     Past Surgical History  Procedure Laterality Date  . Joint replacement Left 2000    hip  . Joint replacement Right 2007    hip  . Biopsy thyroid  2014    Dr Andee Poles    There were no vitals filed for this visit.  Visit Diagnosis:  Unsteadiness on feet  Weakness      Subjective Assessment - 09/28/15 1755    Subjective pt relates she has been feeling well since her last visit and notes less pain in her mid-thighs when walking.  Pt currently has 1-2/10 pain in her LEs.    Limitations Walking   Patient Stated Goals pt wants to walk with improved balance   Currently in Pain? Yes   Pain Score 2    Pain Location Leg   Pain Orientation Left;Right      there ex: nustep x 4 min (no charge) level 1 Bilateral leg press with 75# 2x10 Bilateral leg press with 90# x10 Sit to stand x10 without UE assist Side stepping with red band above knees x 4 laps lengths of //bars Squats on AIREX in //bars 2 x10 Heel raises in //bars 2x10   Neuro re-ed: Marching in place with occasional UE assist in //bars Marching in //bars with instruction to maintain SLS as long as possible, pt required  min UE assist to maintain balance x 1 lap Side stepping on AIREX beam x 3 laps in //bars, pt required close supervision and no UE assist One leg in front on AIREX and the other leg on AIREX with head movements and UE movement x2 min  step on AIREX followed by 4 inch step and then step down onto AIREX x2, pt was unsteady and required mod UE assist  Pt required verbal and visual cues for correct exercise technique.  Pt required close supervision-CGA throughout session                                  PT Education - 09/28/15 1755    Education provided Yes   Education Details plan of care and new exercises for HEP   Person(s) Educated Patient   Methods Explanation   Comprehension Verbalized understanding             PT Long Term Goals - 09/20/15 0824    PT LONG TERM GOAL #1   Title pt's Berg balance score will improve by at least 8 points indicating decreased fall risk    Baseline 44/56 on eval    Time  4   Period Weeks   Status New   PT LONG TERM GOAL #2   Title pt's 5x sit to stand will be less than 11 sec for average time for her age indicating improved functional LE strength    Baseline 18.5 sec on eval    Time 4   Period Weeks   Status New   PT LONG TERM GOAL #3   Title pt's gait speed will be greater than 0.8 m/s with LRAD for safe community ambulation    Baseline 0.7 m/s on eval    Time 4   Period Weeks   Status New               Plan - 09/28/15 1759    Clinical Impression Statement pt did well with strength and balance progression today and noted no pain at the end of the session.  pt required 2-3 rest breaks during strength exercises and did not experience any LOB while ambulating around the gym   Pt will benefit from skilled therapeutic intervention in order to improve on the following deficits Decreased strength;Decreased balance;Pain;Decreased activity tolerance;Decreased endurance;Difficulty walking   Rehab Potential Good    Clinical Impairments Affecting Rehab Potential thoracic aortic aneurysum, HTN   PT Frequency 2x / week   PT Duration 4 weeks   PT Treatment/Interventions Aquatic Therapy;Moist Heat;Therapeutic exercise;Cryotherapy;Therapeutic activities;Functional mobility training;Stair training;Gait training;Balance training;Neuromuscular re-education;Patient/family education;Manual techniques   PT Next Visit Plan 6 min walk test         Problem List Patient Active Problem List   Diagnosis Date Noted  . TAA (Thoracic aortic aneurysm) without rupture 09/20/2015  . Skin cyst right axilla 10/28/2013   Janus Molder, SPT This entire session was performed under direct supervision and direction of a licensed therapist/therapist assistant . I have personally read, edited and approve of the note as written. Carlyon Shadow. Tortorici, PT, DPT [phone]  Tortorici,Ashley 09/29/2015, 3:28 PM  [facility] MAIN [facility] SERVICES [address] [place], [place], [address] Phone: [phone]   Fax:  [phone]

## 2015-10-03 ENCOUNTER — Ambulatory Visit: Payer: Medicare Other | Attending: Internal Medicine

## 2015-10-03 DIAGNOSIS — R2681 Unsteadiness on feet: Secondary | ICD-10-CM | POA: Insufficient documentation

## 2015-10-03 DIAGNOSIS — R531 Weakness: Secondary | ICD-10-CM | POA: Diagnosis present

## 2015-10-04 ENCOUNTER — Ambulatory Visit
Admission: RE | Admit: 2015-10-04 | Discharge: 2015-10-04 | Disposition: A | Discharge status: Home / Self Care | Payer: Medicare Other | Source: Ambulatory Visit | Attending: Internal Medicine | Admitting: Internal Medicine

## 2015-10-04 DIAGNOSIS — Z1231 Encounter for screening mammogram for malignant neoplasm of breast: Secondary | ICD-10-CM | POA: Diagnosis present

## 2015-10-04 NOTE — Therapy (Signed)
[facility] MAIN [facility] SERVICES [address] [place], [place], [address] Phone: [phone]   Fax:  [phone]  Physical Therapy Treatment  Patient Details  Name: Sheila Long MRN: [id number] Date of Birth: 1924-10-11 Referring Provider:  Lyndon Code, MD  Encounter Date: 10/03/2015    Past Medical History  Diagnosis Date  . Hypertension   . Hard of hearing     Past Surgical History  Procedure Laterality Date  . Joint replacement Left 2000    hip  . Joint replacement Right 2007    hip  . Biopsy thyroid  2014    Dr Andee Poles  . Breast biopsy Left     surgical bx neg    There were no vitals filed for this visit.  Visit Diagnosis:  Unsteadiness on feet  Weakness      Subjective Assessment - 10/04/15 0924    Subjective Pt relates she has been doing well and denies any pain currently.  Pt started experiencing lower leg pain with 3/10 on Sunday which prevented her from going to church.  Pt has started using her NewStep at home for exercise.     Limitations Walking   Patient Stated Goals pt wants to walk with improved balance   Currently in Pain? No/denies   Pain Score 0-No pain        Gait training: Pt ambulated from ~300 ft from gym to outside chairs before requiring a rest break.   Pt was able to ascend and descend incline x 2 each with no noted unsteadiness or LOB.  pt does note increased left lower pain when ascending  incline.   Pt was able to ambulate safely from hard surface (sidewalk) to soft surface (grass) without any LOB.  pt's cadence decreases when ambulating on grass surface. Pt ambulated on grass while performing horizontal and vertical head turns for ~20 ft without any LOB.  Pt ambulated with single point cane on R Pt required ~4 min rest break after gait training  Pt requited CGA for safety and min cues for foot clearance and to attend to enviornment  There ex: Nustep level 4 x 4 min no charge Squats  on AIREX 2X10 in //bars Side walking with red band above knees in //bars x 4 laps Hip extension x10 in //bars each LE Left calf stretch 3x30 sec Pt required verbal, visual, and tactile cues for correct exercise technique                          PT Education - 10/04/15 0924    Education provided Yes   Education Details plan of care and new exercises for HEP   Person(s) Educated Patient   Methods Explanation   Comprehension Verbalized understanding             PT Long Term Goals - 09/20/15 0824    PT LONG TERM GOAL #1   Title pt's Berg balance score will improve by at least 8 points indicating decreased fall risk    Baseline 44/56 on eval    Time 4   Period Weeks   Status New   PT LONG TERM GOAL #2   Title pt's 5x sit to stand will be less than 11 sec for average time for her age indicating improved functional LE strength    Baseline 18.5 sec on eval    Time 4   Period Weeks   Status New   PT LONG  TERM GOAL #3   Title pt's gait speed will be greater than 0.8 m/s with LRAD for safe community ambulation    Baseline 0.7 m/s on eval    Time 4   Period Weeks   Status New               Plan - 10/04/15 0925    Clinical Impression Statement Pt really well today and was able to ambulate in the gym and outside without any LOB.  pt did complain of left lower leg pain (6/`10) after gait training and decreased pain at the end of the session (0/10) and noted calf stretch helped decrease her pain.     Pt will benefit from skilled therapeutic intervention in order to improve on the following deficits Decreased strength;Decreased balance;Pain;Decreased activity tolerance;Decreased endurance;Difficulty walking   Rehab Potential Good   Clinical Impairments Affecting Rehab Potential thoracic aortic aneurysum, HTN   PT Frequency 2x / week   PT Duration 4 weeks   PT Treatment/Interventions Aquatic Therapy;Moist Heat;Therapeutic exercise;Cryotherapy;Therapeutic  activities;Functional mobility training;Stair training;Gait training;Balance training;Neuromuscular re-education;Patient/family education;Manual techniques   PT Next Visit Plan progress HEP        Problem List Patient Active Problem List   Diagnosis Date Noted  . TAA (Thoracic aortic aneurysm) without rupture 09/20/2015  . Skin cyst right axilla 10/28/2013   Janus Molder, SPT This entire session was performed under direct supervision and direction of a licensed therapist/therapist assistant . I have personally read, edited and approve of the note as written. Carlyon Shadow. Tortorici, PT, DPT [phone]  Tortorici,Ashley 10/05/2015, 1:32 PM  [facility] MAIN [facility] SERVICES [address] [place], [place], [address] Phone: [phone]   Fax:  [phone]

## 2015-10-05 ENCOUNTER — Ambulatory Visit: Payer: Medicare Other

## 2015-10-05 DIAGNOSIS — R2681 Unsteadiness on feet: Secondary | ICD-10-CM

## 2015-10-05 DIAGNOSIS — R531 Weakness: Secondary | ICD-10-CM

## 2015-10-05 NOTE — Therapy (Signed)
[place] [facility] MAIN [facility] SERVICES [address] [place], [place], [address] Phone: [phone]   Fax:  [phone]  Physical Therapy Treatment  Patient Details  Name: Sheila Long MRN: [id number] Date of Birth: 04-05-1924 Referring Provider:  Lyndon Code, MD  Encounter Date: 10/05/2015      PT End of Session - 10/05/15 1732    Visit Number 5   Number of Visits 9   Date for PT Re-Evaluation 10/17/15   Authorization Type 5/10 g codes   PT Start Time 1345   PT Stop Time 1430   PT Time Calculation (min) 45 min   Equipment Utilized During Treatment Gait belt   Activity Tolerance Patient tolerated treatment well   Behavior During Therapy [facility] for tasks assessed/performed      Past Medical History  Diagnosis Date  . Hypertension   . Hard of hearing     Past Surgical History  Procedure Laterality Date  . Joint replacement Left 2000    hip  . Joint replacement Right 2007    hip  . Biopsy thyroid  2014    Dr Andee Poles  . Breast biopsy Left     surgical bx neg    There were no vitals filed for this visit.  Visit Diagnosis:  Unsteadiness on feet  Weakness      Subjective Assessment - 10/05/15 1731    Subjective pt relates she is doing well and is ready to exercise today.     Limitations Walking   Patient Stated Goals pt wants to walk with improved balance   Currently in Pain? No/denies   Pain Score 0-No pain      Recumbent bike x 4 min no charge Squats on AIREX 2X10 in //bars Side walking with blue band above knees in //bars x 1 lap Side stepping with yellow band around toes 2x15 ft Left calf stretch 3x30 sec Bilateral leg press with 4, 5, 6 plates# V42 Heel raises in //bars 2x10  Pt required verbal, visual, and tactile cues for correct exercise technique  Neuro re-ed: Marching in place with occasional UE assist in //bars Side stepping on AIREX beam x 3 laps in //bars, pt required close supervision and no UE  assist Forward walking on AIREX beam x 3 laps in //bars , pt required close supervision and occasional min UE assist.                             PT Education - 10/05/15 1732    Education provided Yes   Education Details plan of care and reviewed gastroc stretch    Person(s) Educated Patient   Methods Explanation   Comprehension Verbalized understanding             PT Long Term Goals - 09/20/15 0824    PT LONG TERM GOAL #1   Title pt's Berg balance score will improve by at least 8 points indicating decreased fall risk    Baseline 44/56 on eval    Time 4   Period Weeks   Status New   PT LONG TERM GOAL #2   Title pt's 5x sit to stand will be less than 11 sec for average time for her age indicating improved functional LE strength    Baseline 18.5 sec on eval    Time 4   Period Weeks   Status New   PT LONG TERM GOAL #3   Title pt's gait  speed will be greater than 0.8 m/s with LRAD for safe community ambulation    Baseline 0.7 m/s on eval    Time 4   Period Weeks   Status New               Plan - 10/05/15 1732    Clinical Impression Statement pt did well with strength progression and did not report an increase in mid thigh pain.  pt did note left lower leg pain which decresaed with gastroc stretch.  pt was able to ambualte safely in the gym without an assistive device.   Pt will benefit from skilled therapeutic intervention in order to improve on the following deficits Decreased strength;Decreased balance;Pain;Decreased activity tolerance;Decreased endurance;Difficulty walking   Rehab Potential Good   Clinical Impairments Affecting Rehab Potential thoracic aortic aneurysum, HTN   PT Frequency 2x / week   PT Duration 4 weeks   PT Treatment/Interventions Aquatic Therapy;Moist Heat;Therapeutic exercise;Cryotherapy;Therapeutic activities;Functional mobility training;Stair training;Gait training;Balance training;Neuromuscular  re-education;Patient/family education;Manual techniques   PT Next Visit Plan progress HEP        Problem List Patient Active Problem List   Diagnosis Date Noted  . TAA (Thoracic aortic aneurysm) without rupture 09/20/2015  . Skin cyst right axilla 10/28/2013   Luanna Salk Donald Pore Anthany Thornhill 10/05/2015, 5:39 PM This entire session was performed under direct supervision and direction of a licensed therapist/therapist assistant . I have personally read, edited and approve of the note as written. Carlyon Shadow. Tortorici, PT, [place] #[id number] [phone] 10/07/15  [place] [facility] MAIN [facility] SERVICES [address] [place], [place], [address] Phone: [phone]   Fax:  [phone]

## 2015-10-09 ENCOUNTER — Ambulatory Visit: Payer: Medicare Other

## 2015-10-09 DIAGNOSIS — R2681 Unsteadiness on feet: Secondary | ICD-10-CM

## 2015-10-09 DIAGNOSIS — R531 Weakness: Secondary | ICD-10-CM

## 2015-10-10 NOTE — Therapy (Addendum)
[place] [facility] [facility] MAIN [facility] SERVICES [address] [place], [place], [address] Phone: [phone]   Fax:  [phone]  Physical Therapy Treatment  Patient Details  Name: Sheila Long MRN: [id number] Date of Birth: 1924/11/05 Referring Provider:  Lyndon Code, MD  Encounter Date: 10/09/2015      PT End of Session - 10/10/15 1108    Visit Number 6   Number of Visits 9   Date for PT Re-Evaluation 10/17/15   Authorization Type 6/10 g codes   PT Start Time 1645   PT Stop Time 1730   PT Time Calculation (min) 45 min   Equipment Utilized During Treatment Gait belt   Activity Tolerance Patient tolerated treatment well   Behavior During Therapy [facility] for tasks assessed/performed      Past Medical History  Diagnosis Date  . Hypertension   . Hard of hearing     Past Surgical History  Procedure Laterality Date  . Joint replacement Left 2000    hip  . Joint replacement Right 2007    hip  . Biopsy thyroid  2014    Dr Andee Poles  . Breast biopsy Left     surgical bx neg    There were no vitals filed for this visit.  Visit Diagnosis:  Unsteadiness on feet  Weakness      Subjective Assessment - 10/10/15 1107    Subjective Pt denies any pain currently.  pt relates she has been doing well since her last session and has been performing her HEP.     Limitations Walking   Patient Stated Goals pt wants to walk with improved balance   Currently in Pain? No/denies   Pain Score 0-No pain        Recumbent bike x 4 min no charge Side walking red band around ankles 2x 3 laps in //bars Hip extension with red band around ankles x5 Forward lunge on BOSU x10 each LE in //bars and required min UE assist Forward lunge on 6 inch step 2x10 each LE in //bars and required occasional UE assist Side lunge on 6 inch step x10 each LE Pt required verbal, visual, and tactile cues for correct exercise technique  Neuro re-ed: Side stepping with squat on  AIREX beam 2x 3 laps in //bars, pt required close supervision and occasional UE assist  Pt required 3 rest breaks each 2-3 minutes                           PT Education - 10/10/15 1108    Education provided Yes   Education Details plan of care and strength progression    Person(s) Educated Patient   Methods Explanation   Comprehension Verbalized understanding             PT Long Term Goals - 09/20/15 0824    PT LONG TERM GOAL #1   Title pt's Berg balance score will improve by at least 8 points indicating decreased fall risk    Baseline 44/56 on eval    Time 4   Period Weeks   Status New   PT LONG TERM GOAL #2   Title pt's 5x sit to stand will be less than 11 sec for average time for her age indicating improved functional LE strength    Baseline 18.5 sec on eval    Time 4   Period Weeks   Status New   PT LONG TERM GOAL #3  Title pt's gait speed will be greater than 0.8 m/s with LRAD for safe community ambulation    Baseline 0.7 m/s on eval    Time 4   Period Weeks   Status New               Plan - 10/10/15 1110    Clinical Impression Statement Pt is progressing well with PT and did not experience an increase in pain in her mid-thigh with progression of strength and balance exercises.  Pt required increased rest breaks with exercise progression and reported being fatigued at the end of the session. pt's dynamic and static balance performance improved throughout out the session with each repetition.     Pt will benefit from skilled therapeutic intervention in order to improve on the following deficits Decreased strength;Decreased balance;Pain;Decreased activity tolerance;Decreased endurance;Difficulty walking   Rehab Potential Good   Clinical Impairments Affecting Rehab Potential thoracic aortic aneurysum, HTN   PT Frequency 2x / week   PT Duration 4 weeks   PT Treatment/Interventions Aquatic Therapy;Moist Heat;Therapeutic  exercise;Cryotherapy;Therapeutic activities;Functional mobility training;Stair training;Gait training;Balance training;Neuromuscular re-education;Patient/family education;Manual techniques   PT Next Visit Plan progress HEP        Problem List Patient Active Problem List   Diagnosis Date Noted  . TAA (Thoracic aortic aneurysm) without rupture 09/20/2015  . Skin cyst right axilla 10/28/2013   Janus Molder, SPT  This entire session was performed under direct supervision and direction of a licensed therapist/therapist assistant . I have personally read, edited and approve of the note as written.  Carlyon Shadow. Tortorici, PT, DPT [phone]  Tortorici,Ashley 10/10/2015, 1:27 PM  [place] [facility] MAIN [facility] SERVICES [address] [place], [place], [address] Phone: [phone]   Fax:  [phone]

## 2015-10-16 ENCOUNTER — Ambulatory Visit: Payer: Medicare Other

## 2015-10-16 DIAGNOSIS — R2681 Unsteadiness on feet: Secondary | ICD-10-CM | POA: Diagnosis not present

## 2015-10-16 DIAGNOSIS — R531 Weakness: Secondary | ICD-10-CM

## 2015-10-17 NOTE — Therapy (Signed)
[place] MAIN [facility] SERVICES [address] [place], [place], [address] Phone: [phone]   Fax:  [phone]  Physical Therapy Treatment/Discharge Note  9/21-10/17/16  Patient Details  Name: Sheila Long MRN: [id number] Date of Birth: July 18, 1924 Referring Provider: Clayborn Bigness   Encounter Date: 10/16/2015      PT End of Session - 10/17/15 0842    Visit Number 7   Number of Visits 9   Date for PT Re-Evaluation 10/17/15   Authorization Type 7/10 g codes   PT Start Time 9532   PT Stop Time 1430   PT Time Calculation (min) 45 min   Equipment Utilized During Treatment Gait belt   Activity Tolerance Patient tolerated treatment well   Behavior During Therapy [facility] for tasks assessed/performed      Past Medical History  Diagnosis Date  . Hypertension   . Hard of hearing     Past Surgical History  Procedure Laterality Date  . Joint replacement Left 2000    hip  . Joint replacement Right 2007    hip  . Biopsy thyroid  2014    Dr Pryor Ochoa  . Breast biopsy Left     surgical bx neg    There were no vitals filed for this visit.  Visit Diagnosis:  Unsteadiness on feet  Weakness      Subjective Assessment - 10/17/15 0841    Subjective Pt experienced two episodes when her left hand felt numb which resolved within a few minutes.  Pt denies any pain and numbness currently.  Pt feels she has improved since the start of PT and is now able to ambulate short distances without using her cane.     Limitations Walking   Patient Stated Goals pt wants to walk with improved balance   Currently in Pain? No/denies   Pain Score 0-No pain            [facility] PT Assessment - 10/17/15 0843    Assessment   Referring Provider Clayborn Bigness        There ex: Outcome measures were reassessed to assess progress towards PT gaols. TUG without cane: 10 sec TUG with cane: 11 sec Gait speed without cane: 0.9 m/s Gait speed with cane: 0.92 m/s 5x  sit to stand: 10.7 sec Berg balance test: 53/56 DGI: 17/24                       PT Education - 10/17/15 0841    Education provided Yes   Education Details plan of care, outcome measure values and meaning   Person(s) Educated Patient   Methods Explanation   Comprehension Verbalized understanding             PT Long Term Goals - 10/17/15 0846    PT LONG TERM GOAL #1   Title pt's Berg balance score will improve by at least 8 points indicating decreased fall risk    Baseline 44/56 on eval; 53/56 on 10/17   Time 4   Period Weeks   Status Achieved   PT LONG TERM GOAL #2   Title pt's 5x sit to stand will be less than 11 sec for average time for her age indicating improved functional LE strength    Baseline 18.5 sec on eval'; 10.7 sec on 10/17   Time 4   Period Weeks   Status Achieved   PT LONG TERM GOAL #3   Title pt's gait speed will be greater than  0.8 m/s with LRAD for safe community ambulation    Baseline 0.7 m/s on eval; 0.9 m/s on 10/17   Time 4   Period Weeks   Status Achieved               Plan - Nov 05, 2015 0844    Clinical Impression Statement Pt has progressed well with PT, has made significant improvements and has met her goals for PT.  pt notes she has minimal to no pain now in her mid-thigh when she ambulates and feels steadier.  Pt demonstrates improved LE functional strength, has achieved gait speed to be full community ambulator, and has decreased risk of falls.  Pt will be discharged from PT at this time with an HEP to maintain and improve gains made in PT and referred to [employer] to continue exercising.     Pt will benefit from skilled therapeutic intervention in order to improve on the following deficits Decreased strength;Decreased balance;Pain;Decreased activity tolerance;Decreased endurance;Difficulty walking   Rehab Potential Good   Clinical Impairments Affecting Rehab Potential thoracic aortic aneurysum, HTN   PT  Frequency 2x / week   PT Duration 4 weeks   PT Treatment/Interventions Aquatic Therapy;Moist Heat;Therapeutic exercise;Cryotherapy;Therapeutic activities;Functional mobility training;Stair training;Gait training;Balance training;Neuromuscular re-education;Patient/family education;Manual techniques          G-Codes - 05-Nov-2015 0847    Functional Limitation Mobility: Walking and moving around   Mobility: Walking and Moving Around Current Status [phone]) At least 1 percent but less than 20 percent impaired, limited or restricted   Mobility: Walking and Moving Around Goal Status [phone]) At least 1 percent but less than 20 percent impaired, limited or restricted   Mobility: Walking and Moving Around Discharge Status [phone]) At least 1 percent but less than 20 percent impaired, limited or restricted      Problem List Patient Active Problem List   Diagnosis Date Noted  . TAA (Thoracic aortic aneurysm) without rupture 09/20/2015  . Skin cyst right axilla 10/28/2013   Renford Dills, SPT This entire session was performed under direct supervision and direction of a licensed therapist/therapist assistant . I have personally read, edited and approve of the note as written. Gorden Harms. Tortorici, PT, DPT [phone]  Tortorici,Ashley 05-Nov-2015, 1:27 PM  [place] MAIN [facility] SERVICES [address] [place], [place], [address] Phone: [phone]   Fax:  [phone]  Name: Sheila Long MRN: [id number] Date of Birth: 04-19-1924

## 2016-01-16 DIAGNOSIS — I10 Essential (primary) hypertension: Secondary | ICD-10-CM | POA: Diagnosis not present

## 2016-01-16 DIAGNOSIS — G980 Neurogenic arthritis, not elsewhere classified: Secondary | ICD-10-CM | POA: Diagnosis not present

## 2016-01-16 DIAGNOSIS — J45991 Cough variant asthma: Secondary | ICD-10-CM | POA: Diagnosis not present

## 2016-01-16 DIAGNOSIS — M150 Primary generalized (osteo)arthritis: Secondary | ICD-10-CM | POA: Diagnosis not present

## 2016-01-16 DIAGNOSIS — R7301 Impaired fasting glucose: Secondary | ICD-10-CM | POA: Diagnosis not present

## 2016-01-16 DIAGNOSIS — E785 Hyperlipidemia, unspecified: Secondary | ICD-10-CM | POA: Diagnosis not present

## 2016-01-16 DIAGNOSIS — N393 Stress incontinence (female) (male): Secondary | ICD-10-CM | POA: Diagnosis not present

## 2016-01-23 ENCOUNTER — Ambulatory Visit
Admission: RE | Admit: 2016-01-23 | Discharge: 2016-01-23 | Disposition: A | Discharge status: Home / Self Care | Payer: PPO | Source: Ambulatory Visit | Attending: Orthopedic Surgery | Admitting: Orthopedic Surgery

## 2016-01-23 ENCOUNTER — Other Ambulatory Visit: Payer: Self-pay | Admitting: Orthopedic Surgery

## 2016-01-23 DIAGNOSIS — M25561 Pain in right knee: Secondary | ICD-10-CM | POA: Insufficient documentation

## 2016-01-23 DIAGNOSIS — M172 Bilateral post-traumatic osteoarthritis of knee: Secondary | ICD-10-CM | POA: Diagnosis not present

## 2016-01-23 DIAGNOSIS — M25562 Pain in left knee: Secondary | ICD-10-CM | POA: Diagnosis not present

## 2016-01-23 DIAGNOSIS — M170 Bilateral primary osteoarthritis of knee: Secondary | ICD-10-CM | POA: Diagnosis not present

## 2016-01-23 DIAGNOSIS — M179 Osteoarthritis of knee, unspecified: Secondary | ICD-10-CM | POA: Diagnosis not present

## 2016-01-24 DIAGNOSIS — M1711 Unilateral primary osteoarthritis, right knee: Secondary | ICD-10-CM | POA: Diagnosis not present

## 2016-01-26 DIAGNOSIS — M25461 Effusion, right knee: Secondary | ICD-10-CM | POA: Diagnosis not present

## 2016-01-26 DIAGNOSIS — E8881 Metabolic syndrome: Secondary | ICD-10-CM | POA: Diagnosis not present

## 2016-01-26 DIAGNOSIS — M179 Osteoarthritis of knee, unspecified: Secondary | ICD-10-CM | POA: Diagnosis not present

## 2016-01-26 DIAGNOSIS — E119 Type 2 diabetes mellitus without complications: Secondary | ICD-10-CM | POA: Diagnosis not present

## 2016-02-02 DIAGNOSIS — H40003 Preglaucoma, unspecified, bilateral: Secondary | ICD-10-CM | POA: Diagnosis not present

## 2016-03-12 DIAGNOSIS — H40003 Preglaucoma, unspecified, bilateral: Secondary | ICD-10-CM | POA: Diagnosis not present

## 2016-03-25 ENCOUNTER — Other Ambulatory Visit: Payer: Self-pay | Admitting: *Deleted

## 2016-03-25 DIAGNOSIS — I712 Thoracic aortic aneurysm, without rupture, unspecified: Secondary | ICD-10-CM

## 2016-03-28 DIAGNOSIS — M25569 Pain in unspecified knee: Secondary | ICD-10-CM | POA: Diagnosis not present

## 2016-03-28 DIAGNOSIS — D869 Sarcoidosis, unspecified: Secondary | ICD-10-CM | POA: Diagnosis not present

## 2016-03-28 DIAGNOSIS — M179 Osteoarthritis of knee, unspecified: Secondary | ICD-10-CM | POA: Diagnosis not present

## 2016-03-28 DIAGNOSIS — M130 Polyarthritis, unspecified: Secondary | ICD-10-CM | POA: Diagnosis not present

## 2016-04-16 DIAGNOSIS — I70213 Atherosclerosis of native arteries of extremities with intermittent claudication, bilateral legs: Secondary | ICD-10-CM | POA: Diagnosis not present

## 2016-04-16 DIAGNOSIS — E785 Hyperlipidemia, unspecified: Secondary | ICD-10-CM | POA: Diagnosis not present

## 2016-04-16 DIAGNOSIS — I739 Peripheral vascular disease, unspecified: Secondary | ICD-10-CM | POA: Diagnosis not present

## 2016-04-16 DIAGNOSIS — I10 Essential (primary) hypertension: Secondary | ICD-10-CM | POA: Diagnosis not present

## 2016-04-16 DIAGNOSIS — M7989 Other specified soft tissue disorders: Secondary | ICD-10-CM | POA: Diagnosis not present

## 2016-04-16 DIAGNOSIS — M79609 Pain in unspecified limb: Secondary | ICD-10-CM | POA: Diagnosis not present

## 2016-04-24 ENCOUNTER — Ambulatory Visit
Admission: RE | Admit: 2016-04-24 | Discharge: 2016-04-24 | Disposition: A | Discharge status: Home / Self Care | Payer: PPO | Source: Ambulatory Visit | Attending: Surgery | Admitting: Surgery

## 2016-04-24 ENCOUNTER — Ambulatory Visit (INDEPENDENT_AMBULATORY_CARE_PROVIDER_SITE_OTHER): Payer: PPO | Admitting: Surgery

## 2016-04-24 ENCOUNTER — Encounter: Payer: Self-pay | Admitting: Surgery

## 2016-04-24 VITALS — BP 137/80 | HR 74 | Resp 16 | Ht 64.0 in | Wt 213.0 lb

## 2016-04-24 DIAGNOSIS — I712 Thoracic aortic aneurysm, without rupture, unspecified: Secondary | ICD-10-CM

## 2016-04-24 MED ORDER — IOPAMIDOL (ISOVUE-370) INJECTION 76%
75.0000 mL | Freq: Once | INTRAVENOUS | Status: AC | PRN
  Administered 2016-04-24: 75 mL via INTRAVENOUS

## 2016-04-24 NOTE — Progress Notes (Signed)
HPI:  The patient returns for follow up of a 4.2 cm fusiform ascending aortic aneurysm noted on a chest CTA done in 02/2015 when she presented to [facility] with chest pain. Since I last saw her she says that she has developed progressive exertional shortness of breath and fatigue as well as some substernal chest tightness. She seemed particularly concerned by that today although she says it has been going on for many months.   Current Outpatient Prescriptions  Medication Sig Dispense Refill  . acetaminophen (TYLENOL) 325 MG tablet Take 650 mg by mouth every 6 (six) hours as needed.    Marland Kitchen. amLODipine (NORVASC) 2.5 MG tablet Take 2.5 mg by mouth daily.    Marland Kitchen. aspirin 81 MG tablet Take 81 mg by mouth daily.    Marland Kitchen. BISOPROLOL FUMARATE PO Take 5 mg by mouth.    . cholecalciferol (VITAMIN D) 400 UNITS TABS tablet Take 1,000 Units by mouth daily.    Marland Kitchen. ipratropium (ATROVENT) 0.03 % nasal spray     . metoprolol succinate (TOPROL-XL) 50 MG 24 hr tablet Take 50 mg by mouth daily. Take with or immediately following a meal.    . ranitidine (ZANTAC) 150 MG tablet Take 150 mg by mouth 2 (two) times daily.     . montelukast (SINGULAIR) 10 MG tablet Take 10 mg by mouth daily as needed.      No current facility-administered medications for this visit.     Physical Exam: BP 137/80 mmHg  Pulse 74  Resp 16  Ht 5\' 4"  (1.626 m)  Wt 213 lb (96.616 kg)  BMI 36.54 kg/m2  SpO2 97% She looks well Cardiac exam shows a regular rate and rhythm with normal heart sounds and no murmur. Lungs are clear There is no peripheral edema  Diagnostic Tests:  CLINICAL DATA: Follow up thoracic aortic aneurysm. History of coronary artery stent. Creat[facility] at [address]Results: Creatinine 1.0 mg/dL.  EXAM: CT ANGIOGRAPHY CHEST WITH CONTRAST  TECHNIQUE: Multidetector CT imaging of the chest was performed using the standard protocol during bolus administration of  intravenous contrast. Multiplanar CT image reconstructions and MIPs were obtained to evaluate the vascular anatomy.  CONTRAST: 75 ml Isovue 370.  COMPARISON: Chest CTA 03/23/2015  FINDINGS: Mediastinum: The pulmonary arteries are well opacified with contrast. There is no evidence of acute pulmonary embolism. Diffuse atherosclerosis of the aortic arch and descending aorta is again noted with irregular mural thrombus and ulcerated plaque. The ascending aorta has a diameter of 4.0 cm on image 58 of series 4. This diameter is 3.5 cm as measured on coronal image 55. No focal aneurysm or large vessel occlusion. There are no enlarged mediastinal, hilar or axillary lymph nodes. There is a stable moderate size hiatal hernia.  Lungs/Pleura: There are trace bilateral pleural effusions. The lungs appear stable without suspicious findings. There is scattered minimal subpleural nodularity in the right lung which is unchanged.  Upper abdomen: The visualized upper abdomen has a stable appearance without suspicious findings.  Musculoskeletal/Chest wall: No chest wall lesion or acute osseous findings.  Review of the MIP images confirms the above findings.  IMPRESSION: 1. Stable appearance of the thoracic aorta with mild dilatation of the ascending aorta and diffuse ulcerated plaque and irregular mural thrombus in the descending aorta. No evidence of focal aneurysm or dissection. 2. No acute chest findings. 3. Moderate size hiatal hernia.   Electronically Signed  By: Carey BullocksWilliam Veazey M.D.  On:  04/24/2016 11:54   Impression:  She has a stable small fusiform ascending aortic aneurysm that measures 4 cm on today's CT scan. This is slightly smaller than the 4.2 cm measured a year ago so I think it is probably stable. Given her age of 28 I don't think it is necessary to continue doing yearly CT scans to follow this small stable aneurysm. I think the likelihood that this will  enlarge to surgical size in her lifetime is very low and by that time she will not be an operative candidate. Blood pressure control is the most important thing for her. Her exertional dyspnea and fatigue with chest pressure sound significant and she is going to discuss that with Dr. Darrold Junker.  Plan:  She will continue to follow up with her PCP and cardiologist. I will be happy to see her back if the need arises.   Alleen Borne, MD [facility] [phone]

## 2016-05-03 DIAGNOSIS — R05 Cough: Secondary | ICD-10-CM | POA: Diagnosis not present

## 2016-05-03 DIAGNOSIS — R0602 Shortness of breath: Secondary | ICD-10-CM | POA: Diagnosis not present

## 2016-05-03 DIAGNOSIS — J310 Chronic rhinitis: Secondary | ICD-10-CM | POA: Diagnosis not present

## 2016-05-03 DIAGNOSIS — D860 Sarcoidosis of lung: Secondary | ICD-10-CM | POA: Diagnosis not present

## 2016-06-03 DIAGNOSIS — M25561 Pain in right knee: Secondary | ICD-10-CM | POA: Diagnosis not present

## 2016-06-03 DIAGNOSIS — M1711 Unilateral primary osteoarthritis, right knee: Secondary | ICD-10-CM | POA: Diagnosis not present

## 2016-06-03 DIAGNOSIS — D860 Sarcoidosis of lung: Secondary | ICD-10-CM | POA: Diagnosis not present

## 2016-06-03 DIAGNOSIS — R05 Cough: Secondary | ICD-10-CM | POA: Diagnosis not present

## 2016-06-03 DIAGNOSIS — R0602 Shortness of breath: Secondary | ICD-10-CM | POA: Diagnosis not present

## 2016-06-11 DIAGNOSIS — J45991 Cough variant asthma: Secondary | ICD-10-CM | POA: Diagnosis not present

## 2016-06-11 DIAGNOSIS — M150 Primary generalized (osteo)arthritis: Secondary | ICD-10-CM | POA: Diagnosis not present

## 2016-06-11 DIAGNOSIS — R7301 Impaired fasting glucose: Secondary | ICD-10-CM | POA: Diagnosis not present

## 2016-06-11 DIAGNOSIS — F411 Generalized anxiety disorder: Secondary | ICD-10-CM | POA: Diagnosis not present

## 2016-06-11 DIAGNOSIS — I10 Essential (primary) hypertension: Secondary | ICD-10-CM | POA: Diagnosis not present

## 2016-06-11 DIAGNOSIS — D860 Sarcoidosis of lung: Secondary | ICD-10-CM | POA: Diagnosis not present

## 2016-07-31 DIAGNOSIS — E119 Type 2 diabetes mellitus without complications: Secondary | ICD-10-CM | POA: Diagnosis not present

## 2016-07-31 DIAGNOSIS — R4589 Other symptoms and signs involving emotional state: Secondary | ICD-10-CM | POA: Diagnosis not present

## 2016-08-02 DIAGNOSIS — I10 Essential (primary) hypertension: Secondary | ICD-10-CM | POA: Diagnosis not present

## 2016-08-02 DIAGNOSIS — R5381 Other malaise: Secondary | ICD-10-CM | POA: Diagnosis not present

## 2016-08-02 DIAGNOSIS — E784 Other hyperlipidemia: Secondary | ICD-10-CM | POA: Diagnosis not present

## 2016-08-08 DIAGNOSIS — N3281 Overactive bladder: Secondary | ICD-10-CM | POA: Diagnosis not present

## 2016-08-08 DIAGNOSIS — N3941 Urge incontinence: Secondary | ICD-10-CM | POA: Diagnosis not present

## 2016-08-14 DIAGNOSIS — E8881 Metabolic syndrome: Secondary | ICD-10-CM | POA: Diagnosis not present

## 2016-08-14 DIAGNOSIS — M179 Osteoarthritis of knee, unspecified: Secondary | ICD-10-CM | POA: Diagnosis not present

## 2016-08-14 DIAGNOSIS — M25461 Effusion, right knee: Secondary | ICD-10-CM | POA: Diagnosis not present

## 2016-08-14 DIAGNOSIS — S76119A Strain of unspecified quadriceps muscle, fascia and tendon, initial encounter: Secondary | ICD-10-CM | POA: Diagnosis not present

## 2016-08-28 DIAGNOSIS — E8881 Metabolic syndrome: Secondary | ICD-10-CM | POA: Diagnosis not present

## 2016-08-28 DIAGNOSIS — I739 Peripheral vascular disease, unspecified: Secondary | ICD-10-CM | POA: Diagnosis not present

## 2016-08-28 DIAGNOSIS — M25461 Effusion, right knee: Secondary | ICD-10-CM | POA: Diagnosis not present

## 2016-09-06 ENCOUNTER — Emergency Department
Admission: EM | Admit: 2016-09-06 | Discharge: 2016-09-06 | Disposition: A | Discharge status: Home / Self Care | Payer: PPO | Attending: Emergency Medicine | Admitting: Emergency Medicine

## 2016-09-06 ENCOUNTER — Emergency Department: Payer: PPO

## 2016-09-06 DIAGNOSIS — J209 Acute bronchitis, unspecified: Secondary | ICD-10-CM

## 2016-09-06 DIAGNOSIS — J069 Acute upper respiratory infection, unspecified: Secondary | ICD-10-CM

## 2016-09-06 DIAGNOSIS — I10 Essential (primary) hypertension: Secondary | ICD-10-CM | POA: Insufficient documentation

## 2016-09-06 DIAGNOSIS — R079 Chest pain, unspecified: Secondary | ICD-10-CM | POA: Diagnosis not present

## 2016-09-06 DIAGNOSIS — Z96643 Presence of artificial hip joint, bilateral: Secondary | ICD-10-CM | POA: Diagnosis not present

## 2016-09-06 DIAGNOSIS — R05 Cough: Secondary | ICD-10-CM | POA: Diagnosis not present

## 2016-09-06 DIAGNOSIS — Z79899 Other long term (current) drug therapy: Secondary | ICD-10-CM | POA: Insufficient documentation

## 2016-09-06 DIAGNOSIS — Z7982 Long term (current) use of aspirin: Secondary | ICD-10-CM | POA: Insufficient documentation

## 2016-09-06 LAB — URINALYSIS COMPLETE WITH MICROSCOPIC (ARMC ONLY)
Bilirubin Urine: NEGATIVE
Glucose, UA: NEGATIVE mg/dL
Hgb urine dipstick: NEGATIVE
Ketones, ur: NEGATIVE mg/dL
Leukocytes, UA: NEGATIVE
Nitrite: NEGATIVE
Protein, ur: NEGATIVE mg/dL
Specific Gravity, Urine: 1.005 (ref 1.005–1.030)
pH: 6 (ref 5.0–8.0)

## 2016-09-06 LAB — BASIC METABOLIC PANEL
Anion gap: 7 (ref 5–15)
BUN: 13 mg/dL (ref 6–20)
CO2: 24 mmol/L (ref 22–32)
Calcium: 8.6 mg/dL — ABNORMAL LOW (ref 8.9–10.3)
Chloride: 105 mmol/L (ref 101–111)
Creatinine, Ser: 1.01 mg/dL — ABNORMAL HIGH (ref 0.44–1.00)
GFR calc Af Amer: 55 mL/min — ABNORMAL LOW (ref >60)
GFR calc non Af Amer: 48 mL/min — ABNORMAL LOW (ref >60)
Glucose, Bld: 227 mg/dL — ABNORMAL HIGH (ref 65–99)
Potassium: 4.4 mmol/L (ref 3.5–5.1)
Sodium: 136 mmol/L (ref 135–145)

## 2016-09-06 LAB — CBC
HCT: 42.6 % (ref 35.0–47.0)
Hemoglobin: 14.5 g/dL (ref 12.0–16.0)
MCH: 33.9 pg (ref 26.0–34.0)
MCHC: 34.1 g/dL (ref 32.0–36.0)
MCV: 99.5 fL (ref 80.0–100.0)
Platelets: 165 10*3/uL (ref 150–440)
RBC: 4.29 MIL/uL (ref 3.80–5.20)
RDW: 14.2 % (ref 11.5–14.5)
WBC: 7.8 10*3/uL (ref 3.6–11.0)

## 2016-09-06 LAB — TROPONIN I: Troponin I: <0.03 ng/mL (ref <0.03)

## 2016-09-06 MED ORDER — ALBUTEROL SULFATE HFA 108 (90 BASE) MCG/ACT IN AERS: 2.0000 | INHALATION_SPRAY | Freq: Four times a day (QID) | RESPIRATORY_TRACT | 2 refills | Status: DC | PRN

## 2016-09-06 MED ORDER — PREDNISONE 20 MG PO TABS
60.0000 mg | ORAL_TABLET | Freq: Every day | ORAL | 0 refills | Status: AC
Start: 2016-09-06 — End: 2016-09-10

## 2016-09-06 MED ORDER — PREDNISONE 20 MG PO TABS
60.0000 mg | ORAL_TABLET | Freq: Once | ORAL | Status: AC
  Administered 2016-09-06: 60 mg via ORAL
  Filled 2016-09-06: qty 3

## 2016-09-06 MED ORDER — AZITHROMYCIN 500 MG PO TABS
500.0000 mg | ORAL_TABLET | Freq: Once | ORAL | Status: AC
  Administered 2016-09-06: 500 mg via ORAL
  Filled 2016-09-06: qty 1

## 2016-09-06 MED ORDER — SODIUM CHLORIDE 0.9 % IV BOLUS (SEPSIS)
1000.0000 mL | Freq: Once | INTRAVENOUS | Status: AC
Start: 2016-09-06 — End: 2016-09-06
  Administered 2016-09-06: 1000 mL via INTRAVENOUS

## 2016-09-06 MED ORDER — IPRATROPIUM-ALBUTEROL 0.5-2.5 (3) MG/3ML IN SOLN
3.0000 mL | Freq: Once | RESPIRATORY_TRACT | Status: AC
Start: 2016-09-06 — End: 2016-09-06
  Administered 2016-09-06: 3 mL via RESPIRATORY_TRACT

## 2016-09-06 MED ORDER — AZITHROMYCIN 250 MG PO TABS: 250.0000 mg | ORAL_TABLET | Freq: Every day | ORAL | 0 refills | Status: AC

## 2016-09-06 MED ORDER — IPRATROPIUM-ALBUTEROL 0.5-2.5 (3) MG/3ML IN SOLN
RESPIRATORY_TRACT | Status: AC
  Filled 2016-09-06: qty 3

## 2016-09-06 MED ORDER — IPRATROPIUM-ALBUTEROL 0.5-2.5 (3) MG/3ML IN SOLN
3.0000 mL | Freq: Once | RESPIRATORY_TRACT | Status: AC
  Administered 2016-09-06: 3 mL via RESPIRATORY_TRACT
  Filled 2016-09-06: qty 3

## 2016-09-06 NOTE — ED Notes (Signed)
Pt transported to xray 

## 2016-09-06 NOTE — ED Triage Notes (Signed)
Pt arrives to ER via POV c/o cough, congestion and body aches since last Sunday. Pt reports that her sx began with sore throat. Pt alert and oriented X4, active, cooperative, pt in NAD. RR even and unlabored, color WNL.

## 2016-09-06 NOTE — Discharge Instructions (Signed)
Take antibiotics once a day starting tomorrow. Take prednisone once a day starting tomorrow. Use your albuterol inhaler every 4-6 hours to cough as needed for shortness of breath. Follow up with her primary care doctor or your pulmonologist on Monday. Return to the emergency room if you have chest pain, worsening shortness of breath, or any other symptoms concerning to you.

## 2016-09-06 NOTE — ED Provider Notes (Signed)
[facility] Emergency Department Provider Note  ____________________________________________  Time seen: Approximately 12:44 PM  I have reviewed the triage vital signs and the nursing notes.   HISTORY  Chief Complaint Cough and Generalized Body Aches   HPI Sheila Long is a 82 y.o. female history of CAD, hypertension, thoracic aortic aneurysm, sarcoidosis, hyperlipidemia who presents for evaluation of sore throat, cough and body aches. Patient reports that she's been having sore throat for 5 days and 4 days of congestion, dry cough, body aches and shortness of breath. She has no history of COPD, asthma, or wheezing the past. No fever or chills, no chest pain, no nausea, no vomiting, no diarrhea, no dysuria, no hematuria, no rash. She reports that her shortness of breath is worse with exertion. She lives at home. No recent hospitalizations. No known sick contact exposures.  Past Medical History:  Diagnosis Date  . Hard of hearing   . Hypertension     Patient Active Problem List   Diagnosis Date Noted  . TAA (Thoracic aortic aneurysm) without rupture 09/20/2015  . Skin cyst right axilla 10/28/2013    Past Surgical History:  Procedure Laterality Date  . BIOPSY THYROID  2014   Dr Andee Poles  . BREAST BIOPSY Left    surgical bx neg  . JOINT REPLACEMENT Left 2000   hip  . JOINT REPLACEMENT Right 2007   hip    Prior to Admission medications   Medication Sig Start Date End Date Taking? Authorizing Provider  acetaminophen (TYLENOL) 325 MG tablet Take 650 mg by mouth every 6 (six) hours as needed.   Yes Historical Provider, MD  amLODipine (NORVASC) 2.5 MG tablet Take 2.5 mg by mouth daily.   Yes Historical Provider, MD  aspirin 81 MG tablet Take 81 mg by mouth daily.   Yes Historical Provider, MD  metoprolol succinate (TOPROL-XL) 50 MG 24 hr tablet Take 50 mg by mouth 2 (two) times daily. Take with or immediately following a meal.    Yes Historical  Provider, MD  pantoprazole (PROTONIX) 40 MG tablet Take 40 mg by mouth daily.   Yes Historical Provider, MD  albuterol (PROVENTIL HFA;VENTOLIN HFA) 108 (90 Base) MCG/ACT inhaler Inhale 2 puffs into the lungs every 6 (six) hours as needed for wheezing or shortness of breath. 09/06/16   Nita Sickle, MD  azithromycin (ZITHROMAX) 250 MG tablet Take 1 tablet (250 mg total) by mouth daily. Take 1 tablet daily for 3 days. 09/06/16 09/10/16  Nita Sickle, MD  BISOPROLOL FUMARATE PO Take 5 mg by mouth.    Historical Provider, MD  cholecalciferol (VITAMIN D) 400 UNITS TABS tablet Take 1,000 Units by mouth daily.    Historical Provider, MD  ipratropium (ATROVENT) 0.03 % nasal spray  08/19/13   Historical Provider, MD  montelukast (SINGULAIR) 10 MG tablet Take 10 mg by mouth daily as needed.     Historical Provider, MD  predniSONE (DELTASONE) 20 MG tablet Take 3 tablets (60 mg total) by mouth daily. 09/06/16 09/10/16  Nita Sickle, MD  ranitidine (ZANTAC) 150 MG tablet Take 150 mg by mouth 2 (two) times daily.     Historical Provider, MD    Allergies Tramadol; Sulfa antibiotics; and Zocor [simvastatin]  No family history on file.  Social History Social History  Substance Use Topics  . Smoking status: Never Smoker  . Smokeless tobacco: Not on file  . Alcohol use No    Review of Systems  Constitutional: Negative for fever. + body aches  Eyes: Negative for visual changes. ENT: + sore throat. Cardiovascular: Negative for chest pain. Respiratory: + shortness of breath, cough and congestion Gastrointestinal: Negative for abdominal pain, vomiting or diarrhea. Genitourinary: Negative for dysuria. Musculoskeletal: Negative for back pain. Skin: Negative for rash. Neurological: Negative for headaches, weakness or numbness.  ____________________________________________   PHYSICAL EXAM:  VITAL SIGNS: ED Triage Vitals  Enc Vitals Group     BP 09/06/16 1135 138/64     Pulse Rate 09/06/16  1135 87     Resp 09/06/16 1135 16     Temp 09/06/16 1135 98.7 F (37.1 C)     Temp Source 09/06/16 1158 Oral     SpO2 09/06/16 1135 98 %     Weight 09/06/16 1136 218 lb (98.9 kg)     Height 09/06/16 1136 5\' 4"  (1.626 m)     Head Circumference --      Peak Flow --      Pain Score 09/06/16 1159 8     Pain Loc --      Pain Edu? --      Excl. in GC? --     Constitutional: Alert and oriented. Well appearing and in no apparent distress. HEENT:      Head: Normocephalic and atraumatic.         Eyes: Conjunctivae are normal. Sclera is non-icteric. EOMI. PERRL      Mouth/Throat: Mucous membranes are moist.       Neck: Supple with no signs of meningismus. Cardiovascular: Regular rate and rhythm. No murmurs, gallops, or rubs. 2+ symmetrical distal pulses are present in all extremities. No JVD. Respiratory: Normal respiratory effort. Moving good air, diffuse wheezing bilaterally. No crakles Gastrointestinal: Soft, non tender, and non distended with positive bowel sounds. No rebound or guarding. Genitourinary: No CVA tenderness. Musculoskeletal: Nontender with normal range of motion in all extremities. No edema, cyanosis, or erythema of extremities. Neurologic: Normal speech and language. Face is symmetric. Moving all extremities. No gross focal neurologic deficits are appreciated. Skin: Skin is warm, dry and intact. No rash noted. Psychiatric: Mood and affect are normal. Speech and behavior are normal.  ____________________________________________   LABS (all labs ordered are listed, but only abnormal results are displayed)  Labs Reviewed  BASIC METABOLIC PANEL - Abnormal; Notable for the following:       Result Value   Glucose, Bld 227 (*)    Creatinine, Ser 1.01 (*)    Calcium 8.6 (*)    GFR calc non Af Amer 48 (*)    GFR calc Af Amer 55 (*)    All other components within normal limits  URINALYSIS COMPLETEWITH MICROSCOPIC ([facility] ONLY) - Abnormal; Notable for the following:     Color, Urine STRAW (*)    APPearance CLEAR (*)    Bacteria, UA RARE (*)    Squamous Epithelial / LPF 0-5 (*)    All other components within normal limits  CBC  TROPONIN I   ____________________________________________  EKG  ED ECG REPORT I, Nita Sickle, the attending physician, personally viewed and interpreted this ECG. Normal sinus rhythm, rate of 87, normal intervals, normal axis, no ST elevations or depressions. Unchanged from prior. ____________________________________________  RADIOLOGY  CXR: Negative  ____________________________________________   PROCEDURES  Procedure(s) performed: None Procedures Critical Care performed:  None ____________________________________________   INITIAL IMPRESSION / ASSESSMENT AND PLAN / ED COURSE  82 y.o. female history of CAD, hypertension, thoracic aortic aneurysm, sarcoidosis, hyperlipidemia who presents for evaluation of sore throat, cough congestion and  body aches. Patient is well-appearing, in no distress, has normal vital signs, she has diffuse expiratory wheezes throughout, no crackles, normal work of breathing and normal sats on room air. Presentation concerning for community acquired pneumonia. We'll give a DuoNeb treatment, start patient on azithromycin. We'll check labs, UA, EKG. Oropharynx with no evidence of infection.  Clinical Course  Comment By Time  Patient feels markedly improved after 2 duoneb treatments. Started on azithromycin and prednisone for bronchitis/community-acquired pneumonia. Patient received IV fluids. UA negative for urinary tract infection. Troponin is negative. We'll discharge home on 4 days of azithromycin, 4 days of prednisone, albuterol as needed every 4-6 hours and close follow-up with her pulmonologist on Monday. Patient is comfortable with this plan. Nita Sicklearolina Evelene Roussin, MD 09/08 1601    Pertinent labs & imaging results that were available during my care of the patient were reviewed by me and  considered in my medical decision making (see chart for details).    ____________________________________________   FINAL CLINICAL IMPRESSION(S) / ED DIAGNOSES  Final diagnoses:  URI (upper respiratory infection)  Acute bronchitis, unspecified organism      NEW MEDICATIONS STARTED DURING THIS VISIT:  New Prescriptions   ALBUTEROL (PROVENTIL HFA;VENTOLIN HFA) 108 (90 BASE) MCG/ACT INHALER    Inhale 2 puffs into the lungs every 6 (six) hours as needed for wheezing or shortness of breath.   AZITHROMYCIN (ZITHROMAX) 250 MG TABLET    Take 1 tablet (250 mg total) by mouth daily. Take 1 tablet daily for 3 days.   PREDNISONE (DELTASONE) 20 MG TABLET    Take 3 tablets (60 mg total) by mouth daily.     Note:  This document was prepared using Dragon voice recognition software and may include unintentional dictation errors.    Nita Sicklearolina Gabriell Casimir, MD 09/06/16 [phone]

## 2016-09-09 ENCOUNTER — Telehealth: Payer: Self-pay | Admitting: Internal Medicine

## 2016-09-09 LAB — URINE CULTURE: Culture: >=100000 — AB

## 2016-09-09 NOTE — Telephone Encounter (Signed)
Patient had a new patient appointment with Dr.Letvak on 09/10/16.  Patient went [facility] ER on 09/06/16 for pneumonia. Patient cancelled appointment for tomorrow because she can't leave the house. Can patient be rescheduled sooner than the next new patient appointment.  She's having a hard time breathing and needs to be referred to pulmonary?

## 2016-09-09 NOTE — Telephone Encounter (Signed)
Yes---see what you can do

## 2016-09-09 NOTE — Telephone Encounter (Signed)
I spoke with patient and offered her 09/12/16 or 09/24/16.  Patient chose 09/24/16 at 12:00.

## 2016-09-10 ENCOUNTER — Ambulatory Visit (INDEPENDENT_AMBULATORY_CARE_PROVIDER_SITE_OTHER): Payer: PPO | Admitting: Internal Medicine

## 2016-09-10 DIAGNOSIS — G4733 Obstructive sleep apnea (adult) (pediatric): Secondary | ICD-10-CM

## 2016-09-10 NOTE — Progress Notes (Addendum)
ED CONSULT NOTE - INITIAL   Pharmacy Consult for ED Culture Results   Assessment: 82 yo female seen in ED on 09/06/16 for sore throat, cough, congestion, and SOB. Patient diagnosed with URI and acute bronchitis and D/cd home on azithromycin. Urine culture results from visit showing >100,000 colonies E.coli.   Plan:  9/12:Discussed with Dr. Pershing ProudSchaevitz about patients asymptomatic bacteruria results. He wishes to treat patient since >100,000 colonies.Culture shows pansensitive E.Coli. Plan to prescribe Amoxicillin 500mg  BID x 7 days.   Attempted to call patient 2 times on 9/12 at 1430 and 1500. Message was left of patients voice mail instructed her to call pharmacy back concerning resent ED visit. No call back from patient as of 1918 on 9/12 . Will attempt to contact patient again on 9/13.    9/13: Spoke with patient regarding urine culture results. Explained that prescription for Amoxicillin 500mg  BID x7 days would be called in and answered questions. Patient expressed understanding. [place] [facility] [place] [phone]), patient's pharmacy of choice.   Cher NakaiSheema Jasneet Schobert, PharmD Clinical Pharmacist 09/10/2016 7:18 PM   Allergies  Allergen Reactions  . Tramadol Other (See Comments)    CONFUSION  . Sulfa Antibiotics   . Zocor [Simvastatin] Other (See Comments)    INTOLERANCE-MYALGIAS     Microbiology: Recent Results (from the past 720 hour(s))  Urine culture     Status: Abnormal   Collection Time: 09/06/16  3:00 PM  Result Value Ref Range Status   Specimen Description URINE, RANDOM  Final   Special Requests NONE  Final   Culture >=100,000 COLONIES/mL ESCHERICHIA COLI (A)  Final   Report Status 09/09/2016 FINAL  Final   Organism ID, Bacteria ESCHERICHIA COLI (A)  Final      Susceptibility   Escherichia coli - MIC*    AMPICILLIN 8 SENSITIVE Sensitive     CEFAZOLIN <=4 SENSITIVE Sensitive     CEFTRIAXONE <=1 SENSITIVE Sensitive     CIPROFLOXACIN <=0.25 SENSITIVE Sensitive      GENTAMICIN <=1 SENSITIVE Sensitive     IMIPENEM <=0.25 SENSITIVE Sensitive     NITROFURANTOIN <=16 SENSITIVE Sensitive     TRIMETH/SULFA <=20 SENSITIVE Sensitive     AMPICILLIN/SULBACTAM 4 SENSITIVE Sensitive     PIP/TAZO <=4 SENSITIVE Sensitive     Extended ESBL NEGATIVE Sensitive     * >=100,000 COLONIES/mL ESCHERICHIA COLI

## 2016-09-14 ENCOUNTER — Emergency Department: Payer: PPO

## 2016-09-14 ENCOUNTER — Emergency Department
Admission: EM | Admit: 2016-09-14 | Discharge: 2016-09-14 | Disposition: A | Discharge status: Home / Self Care | Payer: PPO | Attending: Emergency Medicine | Admitting: Emergency Medicine

## 2016-09-14 DIAGNOSIS — I10 Essential (primary) hypertension: Secondary | ICD-10-CM | POA: Diagnosis not present

## 2016-09-14 DIAGNOSIS — R0602 Shortness of breath: Secondary | ICD-10-CM

## 2016-09-14 DIAGNOSIS — J40 Bronchitis, not specified as acute or chronic: Secondary | ICD-10-CM | POA: Diagnosis not present

## 2016-09-14 DIAGNOSIS — Z7982 Long term (current) use of aspirin: Secondary | ICD-10-CM | POA: Diagnosis not present

## 2016-09-14 DIAGNOSIS — Z79899 Other long term (current) drug therapy: Secondary | ICD-10-CM | POA: Insufficient documentation

## 2016-09-14 DIAGNOSIS — R05 Cough: Secondary | ICD-10-CM | POA: Diagnosis not present

## 2016-09-14 LAB — COMPREHENSIVE METABOLIC PANEL
ALT: 14 U/L (ref 14–54)
AST: 20 U/L (ref 15–41)
Albumin: 3.9 g/dL (ref 3.5–5.0)
Alkaline Phosphatase: 78 U/L (ref 38–126)
Anion gap: 6 (ref 5–15)
BUN: 14 mg/dL (ref 6–20)
CO2: 26 mmol/L (ref 22–32)
Calcium: 8.8 mg/dL — ABNORMAL LOW (ref 8.9–10.3)
Chloride: 107 mmol/L (ref 101–111)
Creatinine, Ser: 1.13 mg/dL — ABNORMAL HIGH (ref 0.44–1.00)
GFR calc Af Amer: 48 mL/min — ABNORMAL LOW (ref >60)
GFR calc non Af Amer: 41 mL/min — ABNORMAL LOW (ref >60)
Glucose, Bld: 201 mg/dL — ABNORMAL HIGH (ref 65–99)
Potassium: 3.6 mmol/L (ref 3.5–5.1)
Sodium: 139 mmol/L (ref 135–145)
Total Bilirubin: 1.3 mg/dL — ABNORMAL HIGH (ref 0.3–1.2)
Total Protein: 6.8 g/dL (ref 6.5–8.1)

## 2016-09-14 LAB — CBC
HCT: 44.7 % (ref 35.0–47.0)
Hemoglobin: 15.4 g/dL (ref 12.0–16.0)
MCH: 34 pg (ref 26.0–34.0)
MCHC: 34.5 g/dL (ref 32.0–36.0)
MCV: 98.6 fL (ref 80.0–100.0)
Platelets: 191 10*3/uL (ref 150–440)
RBC: 4.53 MIL/uL (ref 3.80–5.20)
RDW: 14.1 % (ref 11.5–14.5)
WBC: 8.1 10*3/uL (ref 3.6–11.0)

## 2016-09-14 LAB — TROPONIN I: Troponin I: <0.03 ng/mL (ref <0.03)

## 2016-09-14 MED ORDER — PREDNISONE 20 MG PO TABS: 40.0000 mg | ORAL_TABLET | Freq: Every day | ORAL | 0 refills | Status: DC

## 2016-09-14 MED ORDER — PREDNISONE 20 MG PO TABS
40.0000 mg | ORAL_TABLET | ORAL | Status: AC
  Administered 2016-09-14: 40 mg via ORAL
  Filled 2016-09-14: qty 2

## 2016-09-14 MED ORDER — IPRATROPIUM-ALBUTEROL 0.5-2.5 (3) MG/3ML IN SOLN
3.0000 mL | Freq: Once | RESPIRATORY_TRACT | Status: AC
  Administered 2016-09-14: 3 mL via RESPIRATORY_TRACT
  Filled 2016-09-14: qty 3

## 2016-09-14 MED ORDER — ALBUTEROL SULFATE HFA 108 (90 BASE) MCG/ACT IN AERS: 2.0000 | INHALATION_SPRAY | RESPIRATORY_TRACT | 0 refills | Status: DC | PRN

## 2016-09-14 MED ORDER — ALBUTEROL SULFATE (2.5 MG/3ML) 0.083% IN NEBU
5.0000 mg | INHALATION_SOLUTION | Freq: Once | RESPIRATORY_TRACT | Status: AC
Start: 2016-09-14 — End: 2016-09-14
  Administered 2016-09-14: 5 mg via RESPIRATORY_TRACT
  Filled 2016-09-14: qty 6

## 2016-09-14 NOTE — ED Provider Notes (Signed)
[facility] Emergency Department Provider Note  ____________________________________________  Time seen: Approximately 2:52 PM  I have reviewed the triage vital signs and the nursing notes.   HISTORY  Chief Complaint [name]    HPI Sheila Long is a 82 y.o. female who complains of shortness of breath and nonproductive cough for the past 2 days. No chest pain. No fevers chills or sweats. She was recently in the ED with some URI symptoms, given azithromycin, prednisone, and albuterol, and discharged home. She completed her course of antibiotics and actually then was started on ampicillin forUTI on urine culture follow-up.  She felt better, except for ongoing intermittent shortness of breath. Not exertional, not pleuritic. No dizziness or syncope. Tolerating oral intake just fine     Past Medical History:  Diagnosis Date  . Hard of hearing   . Hypertension      Patient Active Problem List   Diagnosis Date Noted  . TAA (Thoracic aortic aneurysm) without rupture 09/20/2015  . Skin cyst right axilla 10/28/2013     Past Surgical History:  Procedure Laterality Date  . BIOPSY THYROID  2014   Dr Andee PolesVaught  . BREAST BIOPSY Left    surgical bx neg  . JOINT REPLACEMENT Left 2000   hip  . JOINT REPLACEMENT Right 2007   hip     Prior to Admission medications   Medication Sig Start Date End Date Taking? Authorizing Provider  acetaminophen (TYLENOL) 325 MG tablet Take 650 mg by mouth every 6 (six) hours as needed.    Historical Provider, MD  albuterol (PROVENTIL HFA) 108 (90 Base) MCG/ACT inhaler Inhale 2 puffs into the lungs every 4 (four) hours as needed for wheezing or shortness of breath. 09/14/16   Sharman CheekPhillip Gricel Copen, MD  amLODipine (NORVASC) 2.5 MG tablet Take 2.5 mg by mouth daily.    Historical Provider, MD  aspirin 81 MG tablet Take 81 mg by mouth daily.    Historical Provider, MD  BISOPROLOL FUMARATE PO Take 5 mg by mouth.     Historical Provider, MD  cholecalciferol (VITAMIN D) 400 UNITS TABS tablet Take 1,000 Units by mouth daily.    Historical Provider, MD  ipratropium (ATROVENT) 0.03 % nasal spray  08/19/13   Historical Provider, MD  metoprolol succinate (TOPROL-XL) 50 MG 24 hr tablet Take 50 mg by mouth 2 (two) times daily. Take with or immediately following a meal.     Historical Provider, MD  montelukast (SINGULAIR) 10 MG tablet Take 10 mg by mouth daily as needed.     Historical Provider, MD  pantoprazole (PROTONIX) 40 MG tablet Take 40 mg by mouth daily.    Historical Provider, MD  predniSONE (DELTASONE) 20 MG tablet Take 2 tablets (40 mg total) by mouth daily. 09/14/16   Sharman CheekPhillip Kendrea Cerritos, MD  ranitidine (ZANTAC) 150 MG tablet Take 150 mg by mouth 2 (two) times daily.     Historical Provider, MD     Allergies Tramadol; Sulfa antibiotics; and Zocor [simvastatin]   No family history on file.  Social History Social History  Substance Use Topics  . Smoking status: Never Smoker  . Smokeless tobacco: Not on file  . Alcohol use No    Review of Systems  Constitutional:   No fever or chills.  ENT:   Recent sore throat which is now resolved. Cardiovascular:   No chest pain. Respiratory:   Positive shortness of breath and nonproductive cough. Genitourinary:   Negative for dysuria or difficulty urinating. Musculoskeletal:  Negative for focal pain or swelling  10-point ROS otherwise negative.  ____________________________________________   PHYSICAL EXAM:  VITAL SIGNS: ED Triage Vitals  Enc Vitals Group     BP 09/14/16 1239 127/68     Pulse Rate 09/14/16 1239 78     Resp 09/14/16 1239 20     Temp 09/14/16 1239 97.9 F (36.6 C)     Temp Source 09/14/16 1239 Oral     SpO2 09/14/16 1239 96 %     Weight 09/14/16 1239 218 lb (98.9 kg)     Height 09/14/16 1239 5\' 4"  (1.626 m)     Head Circumference --      Peak Flow --      Pain Score 09/14/16 1242 8     Pain Loc --      Pain Edu? --       Excl. in GC? --     Vital signs reviewed, nursing assessments reviewed.   Constitutional:   Alert and oriented. Well appearing and in no distress. Eyes:   No scleral icterus. No conjunctival pallor. PERRL. EOMI.  No nystagmus. ENT   Head:   Normocephalic and atraumatic.   Nose:   No congestion/rhinnorhea. No septal hematoma   Mouth/Throat:   MMM, no pharyngeal erythema. No peritonsillar mass.    Neck:   No stridor. No SubQ emphysema. No meningismus. Hematological/Lymphatic/Immunilogical:   No cervical lymphadenopathy. Cardiovascular:   RRR. Symmetric bilateral radial and DP pulses.  No murmurs.  Respiratory:   Normal respiratory effort without tachypnea nor retractions. Mild respiratory wheezing, coughing provoked with forceful expirations and deep breathing. No focal crackles Gastrointestinal:   Soft and nontender. Non distended. There is no CVA tenderness.  No rebound, rigidity, or guarding. Genitourinary:   deferred Musculoskeletal:   Nontender with normal range of motion in all extremities. No joint effusions.  No lower extremity tenderness.  No edema. Neurologic:   Normal speech and language.  CN 2-10 normal. Motor grossly intact. No gross focal neurologic deficits are appreciated.  Skin:    Skin is warm, dry and intact. No rash noted.  No petechiae, purpura, or bullae.  ____________________________________________    LABS (pertinent positives/negatives) (all labs ordered are listed, but only abnormal results are displayed) Labs Reviewed  COMPREHENSIVE METABOLIC PANEL - Abnormal; Notable for the following:       Result Value   Glucose, Bld 201 (*)    Creatinine, Ser 1.13 (*)    Calcium 8.8 (*)    Total Bilirubin 1.3 (*)    GFR calc non Af Amer 41 (*)    GFR calc Af Amer 48 (*)    All other components within normal limits  CBC  TROPONIN I   ____________________________________________   EKG  Interpreted by me Normal sinus rhythm rate of 76, normal  axis and intervals. Normal QRS ST segments and T waves.  ____________________________________________    RADIOLOGY  Chest x-ray unremarkable  ____________________________________________   PROCEDURES Procedures  ____________________________________________   INITIAL IMPRESSION / ASSESSMENT AND PLAN / ED COURSE  Pertinent labs & imaging results that were available during my care of the patient were reviewed by me and considered in my medical decision making (see chart for details).  Patient well appearing no acute distress. Presents with wheezing and evidence of reactive airway disease and bronchitis. No evidence of pneumonia, vital signs unremarkable, labs at baseline. We'll continue on albuterol and prednisone, follow up with primary care. Has already completed 2 courses of antibiotics and would not  benefit from further antibiotics at this point. No evidence of healthcare associated pneumonia or sepsis.Considering the patient's symptoms, medical history, and physical examination today, I have low suspicion for ACS, PE, TAD, pneumothorax, carditis, mediastinitis, pneumonia, CHF, or sepsis.       Clinical Course   ____________________________________________   FINAL CLINICAL IMPRESSION(S) / ED DIAGNOSES  Final diagnoses:  Bronchitis  SOB (shortness of breath)       Portions of this note were generated with dragon dictation software. Dictation errors may occur despite best attempts at proofreading.    Sharman Cheek, MD 09/14/16 [phone]

## 2016-09-14 NOTE — ED Notes (Addendum)
Pt back from xr - states she was dx with pneumonia last week - still coughing and "broke out into a sweat" and was worried that it isn't getting better. Otherwise healthy per family with no complaints of pain.

## 2016-09-14 NOTE — ED Triage Notes (Addendum)
Pt reports diagnosed with Pnuemonia last Friday. Completed Abx treatment. Pt reports worsening symptoms SOB, wheezing and pain in lower back pain worse with taking a deep breathe. Pt also states she also has pain in lower back worsen with movement. Reports productive cough clear thick sputum.

## 2016-09-24 ENCOUNTER — Ambulatory Visit (INDEPENDENT_AMBULATORY_CARE_PROVIDER_SITE_OTHER): Payer: PPO | Admitting: Internal Medicine

## 2016-09-24 ENCOUNTER — Encounter: Payer: Self-pay | Admitting: Internal Medicine

## 2016-09-24 VITALS — BP 140/74 | HR 66 | Temp 97.6°F | Resp 20 | Ht 64.0 in | Wt 219.0 lb

## 2016-09-24 DIAGNOSIS — J301 Allergic rhinitis due to pollen: Secondary | ICD-10-CM

## 2016-09-24 DIAGNOSIS — Z23 Encounter for immunization: Secondary | ICD-10-CM | POA: Diagnosis not present

## 2016-09-24 DIAGNOSIS — I739 Peripheral vascular disease, unspecified: Secondary | ICD-10-CM

## 2016-09-24 DIAGNOSIS — I2510 Atherosclerotic heart disease of native coronary artery without angina pectoris: Secondary | ICD-10-CM

## 2016-09-24 DIAGNOSIS — I712 Thoracic aortic aneurysm, without rupture, unspecified: Secondary | ICD-10-CM

## 2016-09-24 DIAGNOSIS — M170 Bilateral primary osteoarthritis of knee: Secondary | ICD-10-CM

## 2016-09-24 DIAGNOSIS — K219 Gastro-esophageal reflux disease without esophagitis: Secondary | ICD-10-CM

## 2016-09-24 DIAGNOSIS — G629 Polyneuropathy, unspecified: Secondary | ICD-10-CM

## 2016-09-24 DIAGNOSIS — D869 Sarcoidosis, unspecified: Secondary | ICD-10-CM

## 2016-09-24 DIAGNOSIS — G250 Essential tremor: Secondary | ICD-10-CM

## 2016-09-24 DIAGNOSIS — I10 Essential (primary) hypertension: Secondary | ICD-10-CM | POA: Diagnosis not present

## 2016-09-24 DIAGNOSIS — G4733 Obstructive sleep apnea (adult) (pediatric): Secondary | ICD-10-CM

## 2016-09-24 DIAGNOSIS — I700 Atherosclerosis of aorta: Secondary | ICD-10-CM

## 2016-09-24 DIAGNOSIS — G4730 Sleep apnea, unspecified: Secondary | ICD-10-CM | POA: Insufficient documentation

## 2016-09-24 MED ORDER — MONTELUKAST SODIUM 10 MG PO TABS: 10.0000 mg | ORAL_TABLET | Freq: Every day | ORAL | 11 refills | Status: DC | PRN

## 2016-09-24 NOTE — Assessment & Plan Note (Signed)
BP Readings from Last 3 Encounters:  09/24/16 140/74  09/14/16 136/72  09/06/16 113/72   Okay now No change for now

## 2016-09-24 NOTE — Addendum Note (Signed)
Addended by: Eual FinesBRIDGES, SHANNON P on: 09/24/2016 03:18 PM   Modules accepted: Orders

## 2016-09-24 NOTE — Assessment & Plan Note (Signed)
Seen on x-ray No other action for now Would not rush to give statin at her age ASA 81mg  daily

## 2016-09-24 NOTE — Assessment & Plan Note (Signed)
Vague sensory changes in legs Will check B12 with labs next time

## 2016-09-24 NOTE — Progress Notes (Signed)
Pre visit review using our clinic review tool, if applicable. No additional management support is needed unless otherwise documented below in the visit note. 

## 2016-09-24 NOTE — Patient Instructions (Signed)
Please restart the singulair (montelukast) Also start the flonase (fluticasone) every day --hopefully over time these 2 should reduce all the mucus you have.

## 2016-09-24 NOTE — Assessment & Plan Note (Signed)
Diagnosis from years ago May be related to her chronic mucus--- but no CXR abnormalities Not sure this is an issue now

## 2016-09-24 NOTE — Assessment & Plan Note (Signed)
Could be related to her mucus Will continue meds for now

## 2016-09-24 NOTE — Assessment & Plan Note (Signed)
Just rechecked in April Reviewed CT angio

## 2016-09-24 NOTE — Assessment & Plan Note (Signed)
Has stable claudication--especially right leg

## 2016-09-24 NOTE — Progress Notes (Signed)
Subjective:    Patient ID: Sheila Long, female    DOB: [birth year], 82 y.o.   MRN: [id number]  HPI Here to establish and ER follow up Had been going to Dr Tora Kindred Khan--but having some issues Then back to Dr Juel Burrow Then was discharged from Dr Milta Deiters practice  She thought she was diagnosed with pneumonia Reviewed that CXR negative--other than aortic atherosclerosis Has had prednisone and inhalers No history of asthma  Never was a smoker Has acute cough with sputum-- goes back the past few weeks Mostly at  Diagnosed with sarcoidosis in [place]---but not clear cut Now feels better--still with mucus but not really coughing  Has GERD  protonix in AM and zantac at night Mostly controls symptoms--- no dysphagia but will have enough mucus that it can affect her  Does have some allergies--- ?PND  Has tried nasal sprays and antihistamines--Neti pot and saline most help  Long standing HTN Variable meds No heart trouble  Has CAD--stent in past  Has some tremor--mostly with eating Not clear that the sinemet helps at all No balance issues Handwriting is normal Mom did have tremor  Has circulation problems with feet Was seen by Dr Wyn Quaker in the past--but no intervention planned Has pain with walking  Arthritis in right knee Both hips are replaced Sees Dr Ernest Pine  Chronic OSA Sleeps with CPAP  Current Outpatient Prescriptions on File Prior to Visit  Medication Sig Dispense Refill  . acetaminophen (TYLENOL) 325 MG tablet Take 650 mg by mouth every 6 (six) hours as needed.    Marland Kitchen albuterol (PROVENTIL HFA) 108 (90 Base) MCG/ACT inhaler Inhale 2 puffs into the lungs every 4 (four) hours as needed for wheezing or shortness of breath. 1 Inhaler 0  . amLODipine (NORVASC) 2.5 MG tablet Take 2.5 mg by mouth daily.    Marland Kitchen aspirin 81 MG tablet Take 81 mg by mouth daily.    . metoprolol succinate (TOPROL-XL) 50 MG 24 hr tablet Take 50 mg by mouth 2 (two) times daily. Take with or immediately  following a meal.     . montelukast (SINGULAIR) 10 MG tablet Take 10 mg by mouth daily as needed.     . pantoprazole (PROTONIX) 40 MG tablet Take 40 mg by mouth daily.    . ranitidine (ZANTAC) 150 MG tablet Take 150 mg by mouth 2 (two) times daily.      No current facility-administered medications on file prior to visit.     Allergies  Allergen Reactions  . Tramadol Other (See Comments)    CONFUSION  . Sulfa Antibiotics   . Zocor [Simvastatin] Other (See Comments)    INTOLERANCE-MYALGIAS     Past Medical History:  Diagnosis Date  . Allergic rhinitis due to pollen   . Aortic atherosclerosis (HCC)   . Coronary artery disease    Stent in 2000's ([place])  . GERD (gastroesophageal reflux disease)   . Hard of hearing   . Hypertension   . Obstructive sleep apnea   . Osteoarthritis of both knees   . Peripheral vascular disease (HCC)   . Sarcoidosis [facility])     Past Surgical History:  Procedure Laterality Date  . BIOPSY THYROID  2014   Dr Andee Poles  . BREAST BIOPSY Left    surgical bx neg  . JOINT REPLACEMENT Left 2000   hip  . JOINT REPLACEMENT Right 2007   hip    No family history on file.  Social History   Social History  . Marital status:  Widowed    Spouse name: N/A  . Number of children: 0  . Years of education: N/A   Occupational History  . 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 Smoking status: Never Smoker  . Smokeless tobacco: Never Used  . Alcohol use No  . Drug use: No  . Sexual activity: Not on file   Other Topics Concern  . Not on file   Social History Narrative   Past living will   Needs to redo health care POA   Requests sister [employer] as decision makers   Would accept resuscitation   Not sure about tube feeds   Review of Systems  Constitutional: Negative for fatigue and unexpected weight change.       Wears seat belt Still drives and does all instrumental ADLs  HENT: Positive for hearing loss. Negative  for trouble swallowing.        Needs new hearing aides Own teeth--keeps up with dentist  Eyes: Negative for visual disturbance.       No vision loss  Respiratory: Positive for cough and shortness of breath.   Cardiovascular: Negative for chest pain, palpitations and leg swelling.  Gastrointestinal: Positive for constipation. Negative for blood in stool.       Uses tea or prune juice to help bowels  Endocrine: Negative for polydipsia and polyuria.  Genitourinary: Positive for urgency. Negative for hematuria.       Bladder weak at night--- urgency and mild incontinence at night  Musculoskeletal: Positive for arthralgias. Negative for joint swelling.  Skin: Negative for rash.  Allergic/Immunologic: Positive for environmental allergies. Negative for immunocompromised state.  Neurological: Positive for tremors. Negative for dizziness, syncope and light-headedness.       Rare dull headache Numbness in feet at times  Hematological: Negative for adenopathy. Does not bruise/bleed easily.  Psychiatric/Behavioral: Negative for sleep disturbance. The patient is not nervous/anxious.        No major mood issues---just affected by recent mood problems Has had some lorazepam in past       Objective:   Physical Exam  Constitutional: She appears well-developed and well-nourished. No distress.  HENT:  Mouth/Throat: Oropharynx is clear and moist. No oropharyngeal exudate.  Mild nasal congestion   Neck: Normal range of motion. Neck supple. No thyromegaly present.  Cardiovascular: Normal rate, regular rhythm and normal heart sounds.  Exam reveals no gallop.   No murmur heard. Faint pulse on right, absent on left  Pulmonary/Chest: Effort normal and breath sounds normal. No respiratory distress. She has no wheezes. She has no rales.  Abdominal: Soft. There is no tenderness.  Musculoskeletal: She exhibits no tenderness.  At most trace edema  Lymphadenopathy:    She has no cervical adenopathy.  Skin:  No rash noted.  No ulcers  Psychiatric: She has a normal mood and affect. Her behavior is normal.          Assessment & Plan:

## 2016-09-24 NOTE — Assessment & Plan Note (Signed)
I think this may be the cause of the secretions Will have her restart the flonase Trial montelukast

## 2016-09-24 NOTE — Assessment & Plan Note (Signed)
Mild Told her to stop the sinemet

## 2016-10-03 DIAGNOSIS — D869 Sarcoidosis, unspecified: Secondary | ICD-10-CM | POA: Diagnosis not present

## 2016-10-03 DIAGNOSIS — M25461 Effusion, right knee: Secondary | ICD-10-CM | POA: Diagnosis not present

## 2016-10-03 DIAGNOSIS — M179 Osteoarthritis of knee, unspecified: Secondary | ICD-10-CM | POA: Diagnosis not present

## 2016-10-03 DIAGNOSIS — E8881 Metabolic syndrome: Secondary | ICD-10-CM | POA: Diagnosis not present

## 2016-10-04 ENCOUNTER — Encounter: Payer: Self-pay | Admitting: Internal Medicine

## 2016-10-04 NOTE — Progress Notes (Signed)
   Subjective:    Patient ID: Sheila Long, female    DOB: [birth year], 82 y.o.   MRN: [id number]  HPI Added info from prior chart review   Review of Systems     Objective:   Physical Exam        Assessment & Plan:

## 2016-10-04 NOTE — Assessment & Plan Note (Signed)
See added values

## 2016-10-14 ENCOUNTER — Other Ambulatory Visit: Payer: Self-pay | Admitting: Internal Medicine

## 2016-10-14 ENCOUNTER — Other Ambulatory Visit: Payer: Self-pay | Admitting: Family Medicine

## 2016-10-14 DIAGNOSIS — H40003 Preglaucoma, unspecified, bilateral: Secondary | ICD-10-CM | POA: Diagnosis not present

## 2016-10-15 ENCOUNTER — Encounter: Payer: Self-pay | Admitting: Internal Medicine

## 2016-10-15 ENCOUNTER — Ambulatory Visit (INDEPENDENT_AMBULATORY_CARE_PROVIDER_SITE_OTHER): Payer: PPO | Admitting: Internal Medicine

## 2016-10-15 VITALS — BP 126/78 | HR 73 | Temp 97.4°F | Wt 217.0 lb

## 2016-10-15 DIAGNOSIS — M79652 Pain in left thigh: Secondary | ICD-10-CM

## 2016-10-15 DIAGNOSIS — M79659 Pain in unspecified thigh: Secondary | ICD-10-CM | POA: Insufficient documentation

## 2016-10-15 DIAGNOSIS — N644 Mastodynia: Secondary | ICD-10-CM | POA: Insufficient documentation

## 2016-10-15 DIAGNOSIS — M79651 Pain in right thigh: Secondary | ICD-10-CM

## 2016-10-15 NOTE — Progress Notes (Signed)
Subjective:    Patient ID: Sheila Long, female    DOB: 1924-03-26, 82 y.o.   MRN: [id number]  HPI Here due to breast pain Got a recall letter for her mammogram--- told them she had 1 day of sharp pain in left breast 3 days ago. Then again last night Lasts a few seconds---fleeting sharp pain (several of a few seconds)  No falls No injury Chronic issues with that breast--- past negative biopsy No nipple discharge  Current Outpatient Prescriptions on File Prior to Visit  Medication Sig Dispense Refill  . acetaminophen (TYLENOL) 325 MG tablet Take 650 mg by mouth every 6 (six) hours as needed.    Marland Kitchen albuterol (PROVENTIL HFA) 108 (90 Base) MCG/ACT inhaler Inhale 2 puffs into the lungs every 4 (four) hours as needed for wheezing or shortness of breath. 1 Inhaler 0  . amLODipine (NORVASC) 2.5 MG tablet Take 2.5 mg by mouth daily.    Marland Kitchen aspirin 81 MG tablet Take 81 mg by mouth daily.    . metoprolol succinate (TOPROL-XL) 50 MG 24 hr tablet Take 50 mg by mouth 2 (two) times daily. Take with or immediately following a meal.     . montelukast (SINGULAIR) 10 MG tablet Take 1 tablet (10 mg total) by mouth daily as needed. 30 tablet 11  . pantoprazole (PROTONIX) 40 MG tablet Take 40 mg by mouth daily.    . ranitidine (ZANTAC) 150 MG tablet Take 150 mg by mouth 2 (two) times daily.      No current facility-administered medications on file prior to visit.     Allergies  Allergen Reactions  . Tramadol Other (See Comments)    CONFUSION  . Sulfa Antibiotics   . Zocor [Simvastatin] Other (See Comments)    INTOLERANCE-MYALGIAS     Past Medical History:  Diagnosis Date  . Allergic rhinitis due to pollen   . Aortic atherosclerosis (HCC)   . Coronary artery disease    Stent in 2000's ([place])  . GERD (gastroesophageal reflux disease)   . Hard of hearing   . Hypertension   . Neuropathy (HCC)   . Obstructive sleep apnea   . Osteoarthritis of both knees   . Peripheral vascular disease  (HCC)   . Sarcoidosis (HCC)   . Thyroid nodule     Past Surgical History:  Procedure Laterality Date  . BIOPSY THYROID  2014   Dr Andee Poles  . BREAST BIOPSY Left    surgical bx neg  . CARDIOVASCULAR STRESS TEST  05/12/2012   with dobutamine---negative. Normal LV function  . CORONARY ANGIOPLASTY  2006  . JOINT REPLACEMENT Left 2000   hip  . JOINT REPLACEMENT Right 2007   hip    No family history on file.  Social History   Social History  . Marital status: Widowed    Spouse name: N/A  . Number of children: 0  . Years of education: N/A   Occupational History  . 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 Smoking status: Never Smoker  . Smokeless tobacco: Never Used  . Alcohol use No  . Drug use: No  . Sexual activity: Not on file   Other Topics Concern  . Not on file   Social History Narrative   Past living will   Needs to redo health care POA   Requests sister [employer] as decision makers   Would accept resuscitation   Not sure about tube feeds  Review of Systems Still gets some pain in thighs when walking or just getting up Has had follow up with ortho---all good with the THR Tylenol does not seem to help Steps are the worst thing Aleve helps--but doesn't take it now    Objective:   Physical Exam  Genitourinary:  Genitourinary Comments: Cystic changes bilateral breasts---more noticeable on left No worrisome lesions  Musculoskeletal:  Fair ROM in both hips Some upper thigh pain with full internal rotation (but not necessarily the same pain she feels)  Lymphadenopathy:    She has no axillary adenopathy.          Assessment & Plan:

## 2016-10-15 NOTE — Progress Notes (Signed)
Pre visit review using our clinic review tool, if applicable. No additional management support is needed unless otherwise documented below in the visit note. 

## 2016-10-15 NOTE — Assessment & Plan Note (Addendum)
I suspect this is mostly arthritic--- ?knees vs hips THRs seem okay still Didn't do well with tramadol and used codeine in past (but needs it most when she is going out making narcotics a not very good choice) Not clearly vascular--but does have vascular evaluation upcoming with Dr Wyn Quakerew  Discussed OTC analgesics

## 2016-10-15 NOTE — Assessment & Plan Note (Signed)
No worrisome findings Strongly advised against mammograms

## 2016-10-15 NOTE — Patient Instructions (Signed)
Please don't get mammograms anymore. Start tylenol arthritis 650mg  three times every day. If you are going out, like shopping, you can try 1 aleve (naproxen 220mg ) before you go. You should limit aleve to no more than 3-4 per week though. If you need something stronger for pain, we can consider hydrocodone (norco).

## 2016-10-18 ENCOUNTER — Encounter (INDEPENDENT_AMBULATORY_CARE_PROVIDER_SITE_OTHER): Payer: Self-pay | Admitting: Vascular Surgery

## 2016-10-18 ENCOUNTER — Ambulatory Visit (INDEPENDENT_AMBULATORY_CARE_PROVIDER_SITE_OTHER): Payer: PPO | Admitting: Vascular Surgery

## 2016-10-18 ENCOUNTER — Ambulatory Visit (INDEPENDENT_AMBULATORY_CARE_PROVIDER_SITE_OTHER): Payer: PPO

## 2016-10-18 ENCOUNTER — Other Ambulatory Visit (INDEPENDENT_AMBULATORY_CARE_PROVIDER_SITE_OTHER): Payer: Self-pay | Admitting: Vascular Surgery

## 2016-10-18 VITALS — BP 165/83 | HR 65 | Resp 16 | Ht 62.0 in | Wt 215.0 lb

## 2016-10-18 DIAGNOSIS — I10 Essential (primary) hypertension: Secondary | ICD-10-CM | POA: Diagnosis not present

## 2016-10-18 DIAGNOSIS — M79652 Pain in left thigh: Secondary | ICD-10-CM

## 2016-10-18 DIAGNOSIS — I739 Peripheral vascular disease, unspecified: Secondary | ICD-10-CM

## 2016-10-18 DIAGNOSIS — I70213 Atherosclerosis of native arteries of extremities with intermittent claudication, bilateral legs: Secondary | ICD-10-CM

## 2016-10-18 DIAGNOSIS — M79609 Pain in unspecified limb: Secondary | ICD-10-CM | POA: Diagnosis not present

## 2016-10-18 DIAGNOSIS — M79651 Pain in right thigh: Secondary | ICD-10-CM | POA: Diagnosis not present

## 2016-10-18 DIAGNOSIS — M79605 Pain in left leg: Secondary | ICD-10-CM

## 2016-10-18 DIAGNOSIS — I2510 Atherosclerotic heart disease of native coronary artery without angina pectoris: Secondary | ICD-10-CM | POA: Diagnosis not present

## 2016-10-18 DIAGNOSIS — M79604 Pain in right leg: Secondary | ICD-10-CM

## 2016-10-18 NOTE — Assessment & Plan Note (Signed)
The patient complains and the pain is worse on the right than the left. Her right leg has normal perfusion in her left leg is only mild to moderately reduced. I do not think her symptoms are from poor circulation and with much more strongly suspect arthritis and neuropathy.

## 2016-10-18 NOTE — Assessment & Plan Note (Signed)
blood pressure control important in reducing the progression of atherosclerotic disease. On appropriate oral medications.  

## 2016-10-18 NOTE — Assessment & Plan Note (Signed)
Patient was studied with ABIs today which are normal on the right at 1.06 with great waveforms and mild to moderately reduced on the left 0.71 with decent waveforms. Her right leg is fairly painful leg, and this lady has normal blood flow so I do not think that the pain is coming from poor circulation. I suspect this is arthritis and may be some neuropathy. We will recheck her perfusion in about one year since she is somewhat reduced on the left this should be followed. Continue taking aspirin daily. Contact our office with any problems in the interim.

## 2016-10-18 NOTE — Patient Instructions (Signed)
Peripheral Vascular Disease Peripheral vascular disease (PVD) is a disease of the blood vessels that are not part of your heart and brain. A simple term for PVD is poor circulation. In most cases, PVD narrows the blood vessels that carry blood from your heart to the rest of your body. This can result in a decreased supply of blood to your arms, legs, and internal organs, like your stomach or kidneys. However, it most often affects a person's lower legs and feet. There are two types of PVD.  Organic PVD. This is the more common type. It is caused by damage to the structure of blood vessels.  Functional PVD. This is caused by conditions that make blood vessels contract and tighten (spasm). Without treatment, PVD tends to get worse over time. PVD can also lead to acute ischemic limb. This is when an arm or limb suddenly has trouble getting enough blood. This is a medical emergency. CAUSES Each type of PVD has many different causes. The most common cause of PVD is buildup of a fatty material (plaque) inside of your arteries (atherosclerosis). Small amounts of plaque can break off from the walls of the blood vessels and become lodged in a smaller artery. This blocks blood flow and can cause acute ischemic limb. Other common causes of PVD include:  Blood clots that form inside of blood vessels.  Injuries to blood vessels.  Diseases that cause inflammation of blood vessels or cause blood vessel spasms.  Health behaviors and health history that increase your risk of developing PVD. RISK FACTORS  You may have a greater risk of PVD if you:  Have a family history of PVD.  Have certain medical conditions, including:  High cholesterol.  Diabetes.  High blood pressure (hypertension).  Coronary heart disease.  Past problems with blood clots.  Past injury, such as burns or a broken bone. These may have damaged blood vessels in your limbs.  Buerger disease. This is caused by inflamed blood  vessels in your hands and feet.  Some forms of arthritis.  Rare birth defects that affect the arteries in your legs.  Use tobacco.  Do not get enough exercise.  Are obese.  Are age 50 or older. SIGNS AND SYMPTOMS  PVD may cause many different symptoms. Your symptoms depend on what part of your body is not getting enough blood. Some common signs and symptoms include:  Cramps in your lower legs. This may be a symptom of poor leg circulation (claudication).  Pain and weakness in your legs while you are physically active that goes away when you rest (intermittent claudication).  Leg pain when at rest.  Leg numbness, tingling, or weakness.  Coldness in a leg or foot, especially when compared with the other leg.  Skin or hair changes. These can include:  Hair loss.  Shiny skin.  Pale or bluish skin.  Thick toenails.  Inability to get or maintain an erection (erectile dysfunction). People with PVD are more prone to developing ulcers and sores on their toes, feet, or legs. These may take longer than normal to heal. DIAGNOSIS Your health care provider may diagnose PVD from your signs and symptoms. The health care provider will also do a physical exam. You may have tests to find out what is causing your PVD and determine its severity. Tests may include:  Blood pressure recordings from your arms and legs and measurements of the strength of your pulses (pulse volume recordings).  Imaging studies using sound waves to take pictures of   the blood flow through your blood vessels (Doppler ultrasound).  Injecting a dye into your blood vessels before having imaging studies using:  X-rays (angiogram or arteriogram).  Computer-generated X-rays (CT angiogram).  A powerful electromagnetic field and a computer (magnetic resonance angiogram or MRA). TREATMENT Treatment for PVD depends on the cause of your condition and the severity of your symptoms. It also depends on your age. Underlying  causes need to be treated and controlled. These include long-lasting (chronic) conditions, such as diabetes, high cholesterol, and high blood pressure. You may need to first try making lifestyle changes and taking medicines. Surgery may be needed if these do not work. Lifestyle changes may include:  Quitting smoking.  Exercising regularly.  Following a low-fat, low-cholesterol diet. Medicines may include:  Blood thinners to prevent blood clots.  Medicines to improve blood flow.  Medicines to improve your blood cholesterol levels. Surgical procedures may include:  A procedure that uses an inflated balloon to open a blocked artery and improve blood flow (angioplasty).  A procedure to put in a tube (stent) to keep a blocked artery open (stent implant).  Surgery to reroute blood flow around a blocked artery (peripheral bypass surgery).  Surgery to remove dead tissue from an infected wound on the affected limb.  Amputation. This is surgical removal of the affected limb. This may be necessary in cases of acute ischemic limb that are not improved through medical or surgical treatments. HOME CARE INSTRUCTIONS  Take medicines only as directed by your health care provider.  Do not use any tobacco products, including cigarettes, chewing tobacco, or electronic cigarettes. If you need help quitting, ask your health care provider.  Lose weight if you are overweight, and maintain a healthy weight as directed by your health care provider.  Eat a diet that is low in fat and cholesterol. If you need help, ask your health care provider.  Exercise regularly. Ask your health care provider to suggest some good activities for you.  Use compression stockings or other mechanical devices as directed by your health care provider.  Take good care of your feet.  Wear comfortable shoes that fit well.  Check your feet often for any cuts or sores. SEEK MEDICAL CARE IF:  You have cramps in your legs  while walking.  You have leg pain when you are at rest.  You have coldness in a leg or foot.  Your skin changes.  You have erectile dysfunction.  You have cuts or sores on your feet that are not healing. SEEK IMMEDIATE MEDICAL CARE IF:  Your arm or leg turns cold and blue.  Your arms or legs become red, warm, swollen, painful, or numb.  You have chest pain or trouble breathing.  You suddenly have weakness in your face, arm, or leg.  You become very confused or lose the ability to speak.  You suddenly have a very bad headache or lose your vision.   This information is not intended to replace advice given to you by your health care provider. Make sure you discuss any questions you have with your health care provider.   Document Released: 01/23/2005 Document Revised: 01/06/2015 Document Reviewed: 05/26/2014 [employer] Interactive Patient Education 2016 [employer]  

## 2016-10-18 NOTE — Progress Notes (Signed)
MRN : [id number]  Sheila Long is a 82 y.o. (January 12, 1924) female who presents with chief complaint of  Chief Complaint  Patient presents with  . Follow-up  .  History of Present Illness: Patient returns today in follow up of Her peripheral vascular disease. She continues to complain of right knee and calf pain as well as pain in her thighs. This is about the same as it was when she was seen earlier this year. She does not have ulceration or infection. Patient was studied with ABIs today which are normal on the right at 1.06 with great waveforms and mild to moderately reduced on the left 0.71 with decent waveforms.   Current Outpatient Prescriptions  Medication Sig Dispense Refill  . acetaminophen (TYLENOL) 650 MG CR tablet Take 650 mg by mouth 3 (three) times daily.    Marland Kitchen albuterol (PROVENTIL HFA) 108 (90 Base) MCG/ACT inhaler Inhale 2 puffs into the lungs every 4 (four) hours as needed for wheezing or shortness of breath. 1 Inhaler 0  . amLODipine (NORVASC) 2.5 MG tablet Take 2.5 mg by mouth daily.    Marland Kitchen aspirin 81 MG tablet Take 81 mg by mouth daily.    Marland Kitchen loratadine (CLARITIN) 10 MG tablet Take 10 mg by mouth daily.    . metoprolol succinate (TOPROL-XL) 50 MG 24 hr tablet Take 50 mg by mouth 2 (two) times daily. Take with or immediately following a meal.     . montelukast (SINGULAIR) 10 MG tablet Take 1 tablet (10 mg total) by mouth daily as needed. 30 tablet 11  . pantoprazole (PROTONIX) 40 MG tablet Take 40 mg by mouth daily.    . ranitidine (ZANTAC) 150 MG tablet Take 150 mg by mouth 2 (two) times daily.      No current facility-administered medications for this visit.     Past Medical History:  Diagnosis Date  . Allergic rhinitis due to pollen   . Aortic atherosclerosis ([place])   . Coronary artery disease    Stent in 2000's ([place])  . GERD (gastroesophageal reflux disease)   . Hard of hearing   . Hypertension   . Neuropathy ([place])   . Obstructive sleep apnea   .  Osteoarthritis of both knees   . Peripheral vascular disease ([place])   . Sarcoidosis ([place])   . Thyroid nodule     Past Surgical History:  Procedure Laterality Date  . BIOPSY THYROID  2014   Dr Pryor Ochoa  . BREAST BIOPSY Left    surgical bx neg  . CARDIOVASCULAR STRESS TEST  05/12/2012   with dobutamine---negative. Normal LV function  . CORONARY ANGIOPLASTY  2006  . JOINT REPLACEMENT Left 2000   hip  . JOINT REPLACEMENT Right 2007   hip    Social History Social History  Substance Use Topics  . Smoking status: Never Smoker  . Smokeless tobacco: Never Used  . Alcohol use No      Family History No bleeding or clotting disorders  Allergies  Allergen Reactions  . Tramadol Other (See Comments)    CONFUSION  . Sulfa Antibiotics   . Zocor [Simvastatin] Other (See Comments)    INTOLERANCE-MYALGIAS      REVIEW OF SYSTEMS (Negative unless checked)  Constitutional: '[]' Weight loss  '[]' Fever  '[]' Chills Cardiac: '[]' Chest pain   '[]' Chest pressure   '[]' Palpitations   '[]' [name] when laying flat   '[]' [name] at rest   '[]' [name] with exertion. Vascular:  '[x]' Pain in legs with walking   [  x]Pain in legs at rest   '[]' Pain in legs when laying flat   '[]' Claudication   '[]' Pain in feet when walking  '[]' Pain in feet at rest  '[]' Pain in feet when laying flat   '[]' History of DVT   '[]' Phlebitis   '[]' Swelling in legs   '[]' Varicose veins   '[]' Non-healing ulcers Pulmonary:   '[]' Uses home oxygen   '[]' Productive cough   '[]' Hemoptysis   '[]' Wheeze  '[]' COPD   '[]' Asthma Neurologic:  '[]' Dizziness  '[]' Blackouts   '[]' Seizures   '[]' History of stroke   '[]' History of TIA  '[]' Aphasia   '[]' Temporary blindness   '[]' Dysphagia   '[]' Weakness or numbness in arms   '[]' Weakness or numbness in legs Musculoskeletal:  '[x]' Arthritis   '[x]' Joint swelling   '[x]' Joint pain   '[]' Low back pain Hematologic:  '[]' Easy bruising  '[]' Easy bleeding   '[]' Hypercoagulable state   '[]' Anemic   Gastrointestinal:  '[]' Blood in stool   '[]' Vomiting blood   '[]' Gastroesophageal reflux/heartburn   '[]' Abdominal pain Genitourinary:  '[]' Chronic kidney disease   '[]' Difficult urination  '[]' Frequent urination  '[]' Burning with urination   '[]' Hematuria Skin:  '[]' Rashes   '[]' Ulcers   '[]' Wounds Psychological:  '[]' History of anxiety   '[]'  History of major depression.  Physical Examination  BP (!) 165/83   Pulse 65   Resp 16   Ht '5\' 2"'  (1.575 m)   Wt 215 lb (97.5 kg)   BMI 39.32 kg/m  Gen:  WD/WN, NAD. Appears younger than stated age  Head: [place]/AT, No temporalis wasting. Ear/Nose/Throat: Hearing grossly intact, trachea midline Eyes: PERRLA. Sclera non-icteric Neck: Supple, no nuchal rigidity.  No JVD.  Pulmonary:  Good air movement, no use of accessory muscles.  Cardiac: RRR, normal S1, S2 Vascular:  Vessel Right Left  Radial Palpable Palpable  Ulnar Palpable Palpable  Brachial Palpable Palpable  Carotid Palpable, without bruit Palpable, without bruit  Aorta Not palpable N/A  Femoral Palpable Palpable  Popliteal Palpable Palpable  PT Palpable 1+ Palpable  DP 1+ Palpable 1+ Palpable   Gastrointestinal: soft, non-tender/non-distended. No guarding/reflex.  Musculoskeletal: M/S 5/5 throughout.  No deformity or atrophy. Trace bilateral lower extremity edema. Walks with a cane Neurologic: CN 2-12 intact. Pain and light touch intact in extremities.  Symmetrical.  Speech is fluent.  Psychiatric: Judgment intact, Mood & affect appropriate for pt's clinical situation. Dermatologic: No rashes or ulcers noted.  No cellulitis or open wounds. Lymph : No Cervical, Axillary, or Inguinal lymphadenopathy.      Labs Recent Results (from the past 2160 hour(s))  Basic metabolic panel     Status: Abnormal   Collection Time: 09/06/16 11:43 AM  Result Value Ref Range   Sodium 136 135 - 145 mmol/L   Potassium 4.4 3.5 - 5.1 mmol/L   Chloride 105 101 - 111 mmol/L   CO2 24 22 - 32 mmol/L   Glucose, Bld 227 (H) 65 - 99 mg/dL   BUN 13 6 - 20 mg/dL   Creatinine, Ser 1.01  (H) 0.44 - 1.00 mg/dL   Calcium 8.6 (L) 8.9 - 10.3 mg/dL   GFR calc non Af Amer 48 (L) >60 mL/min   GFR calc Af Amer 55 (L) >60 mL/min    Comment: (NOTE) The eGFR has been calculated using the CKD EPI equation. This calculation has not been validated in all clinical situations. eGFR's persistently <60 mL/min signify possible Chronic Kidney Disease.    Anion gap 7 5 - 15  CBC     Status: None   Collection Time: 09/06/16 11:43  AM  Result Value Ref Range   WBC 7.8 3.6 - 11.0 K/uL   RBC 4.29 3.80 - 5.20 MIL/uL   Hemoglobin 14.5 12.0 - 16.0 g/dL   HCT 42.6 35.0 - 47.0 %   MCV 99.5 80.0 - 100.0 fL   MCH 33.9 26.0 - 34.0 pg   MCHC 34.1 32.0 - 36.0 g/dL   RDW 14.2 11.5 - 14.5 %   Platelets 165 150 - 440 K/uL  Troponin I     Status: None   Collection Time: 09/06/16 11:43 AM  Result Value Ref Range   Troponin I <0.03 <0.03 ng/mL  Urinalysis complete, with microscopic ([facility] only)     Status: Abnormal   Collection Time: 09/06/16  3:00 PM  Result Value Ref Range   Color, Urine STRAW (A) YELLOW   APPearance CLEAR (A) CLEAR   Glucose, UA NEGATIVE NEGATIVE mg/dL   Bilirubin Urine NEGATIVE NEGATIVE   Ketones, ur NEGATIVE NEGATIVE mg/dL   Specific Gravity, Urine 1.005 1.005 - 1.030   Hgb urine dipstick NEGATIVE NEGATIVE   pH 6.0 5.0 - 8.0   Protein, ur NEGATIVE NEGATIVE mg/dL   Nitrite NEGATIVE NEGATIVE   Leukocytes, UA NEGATIVE NEGATIVE   RBC / HPF 0-5 0 - 5 RBC/hpf   WBC, UA 0-5 0 - 5 WBC/hpf   Bacteria, UA RARE (A) NONE SEEN   Squamous Epithelial / LPF 0-5 (A) NONE SEEN  Urine culture     Status: Abnormal   Collection Time: 09/06/16  3:00 PM  Result Value Ref Range   Specimen Description URINE, RANDOM    Special Requests NONE    Culture >=100,000 COLONIES/mL ESCHERICHIA COLI (A)    Report Status 09/09/2016 FINAL    Organism ID, Bacteria ESCHERICHIA COLI (A)       Susceptibility   Escherichia coli - MIC*    AMPICILLIN 8 SENSITIVE Sensitive     CEFAZOLIN <=4 SENSITIVE Sensitive      CEFTRIAXONE <=1 SENSITIVE Sensitive     CIPROFLOXACIN <=0.25 SENSITIVE Sensitive     GENTAMICIN <=1 SENSITIVE Sensitive     IMIPENEM <=0.25 SENSITIVE Sensitive     NITROFURANTOIN <=16 SENSITIVE Sensitive     TRIMETH/SULFA <=20 SENSITIVE Sensitive     AMPICILLIN/SULBACTAM 4 SENSITIVE Sensitive     PIP/TAZO <=4 SENSITIVE Sensitive     Extended ESBL NEGATIVE Sensitive     * >=100,000 COLONIES/mL ESCHERICHIA COLI  Comprehensive metabolic panel     Status: Abnormal   Collection Time: 09/14/16 12:59 PM  Result Value Ref Range   Sodium 139 135 - 145 mmol/L   Potassium 3.6 3.5 - 5.1 mmol/L   Chloride 107 101 - 111 mmol/L   CO2 26 22 - 32 mmol/L   Glucose, Bld 201 (H) 65 - 99 mg/dL   BUN 14 6 - 20 mg/dL   Creatinine, Ser 1.13 (H) 0.44 - 1.00 mg/dL   Calcium 8.8 (L) 8.9 - 10.3 mg/dL   Total Protein 6.8 6.5 - 8.1 g/dL   Albumin 3.9 3.5 - 5.0 g/dL   AST 20 15 - 41 U/L   ALT 14 14 - 54 U/L   Alkaline Phosphatase 78 38 - 126 U/L   Total Bilirubin 1.3 (H) 0.3 - 1.2 mg/dL   GFR calc non Af Amer 41 (L) >60 mL/min   GFR calc Af Amer 48 (L) >60 mL/min    Comment: (NOTE) The eGFR has been calculated using the CKD EPI equation. This calculation has not been validated in all clinical  situations. eGFR's persistently <60 mL/min signify possible Chronic Kidney Disease.    Anion gap 6 5 - 15  CBC     Status: None   Collection Time: 09/14/16 12:59 PM  Result Value Ref Range   WBC 8.1 3.6 - 11.0 K/uL   RBC 4.53 3.80 - 5.20 MIL/uL   Hemoglobin 15.4 12.0 - 16.0 g/dL   HCT 44.7 35.0 - 47.0 %   MCV 98.6 80.0 - 100.0 fL   MCH 34.0 26.0 - 34.0 pg   MCHC 34.5 32.0 - 36.0 g/dL   RDW 14.1 11.5 - 14.5 %   Platelets 191 150 - 440 K/uL  Troponin I     Status: None   Collection Time: 09/14/16 12:59 PM  Result Value Ref Range   Troponin I <0.03 <0.03 ng/mL    Radiology No results found.   Assessment/Plan  Peripheral vascular disease ([place]) Patient was studied with ABIs today which are normal  on the right at 1.06 with great waveforms and mild to moderately reduced on the left 0.71 with decent waveforms. Her right leg is fairly painful leg, and this lady has normal blood flow so I do not think that the pain is coming from poor circulation. I suspect this is arthritis and may be some neuropathy. We will recheck her perfusion in about one year since she is somewhat reduced on the left this should be followed. Continue taking aspirin daily. Contact our office with any problems in the interim.  Hypertension blood pressure control important in reducing the progression of atherosclerotic disease. On appropriate oral medications.   Thigh pain The patient complains and the pain is worse on the right than the left. Her right leg has normal perfusion in her left leg is only mild to moderately reduced. I do not think her symptoms are from poor circulation and with much more strongly suspect arthritis and neuropathy.    Leotis Pain, MD  10/18/2016 3:31 PM    This note was created with Dragon medical transcription system.  Any errors from dictation are purely unintentional

## 2016-10-23 ENCOUNTER — Ambulatory Visit: Payer: PPO | Admitting: Internal Medicine

## 2016-10-30 ENCOUNTER — Telehealth: Payer: Self-pay

## 2016-10-30 NOTE — Telephone Encounter (Signed)
Pt left v/m; pt was seen on 10/15/16; pt cannot find her mucinex; pt said meds were checked at 10/15/16 appt and pt wants to know if her mucinex was found at Dr Karle StarchLetvak's office.

## 2016-10-31 NOTE — Telephone Encounter (Signed)
Spoke to pt. Advised her I did not find her medication. She will get some OTC

## 2016-11-07 DIAGNOSIS — Z96643 Presence of artificial hip joint, bilateral: Secondary | ICD-10-CM | POA: Diagnosis not present

## 2016-11-07 DIAGNOSIS — M4696 Unspecified inflammatory spondylopathy, lumbar region: Secondary | ICD-10-CM | POA: Diagnosis not present

## 2016-11-27 ENCOUNTER — Encounter: Payer: Self-pay | Admitting: Primary Care

## 2016-11-27 ENCOUNTER — Ambulatory Visit (INDEPENDENT_AMBULATORY_CARE_PROVIDER_SITE_OTHER): Payer: PPO | Admitting: Primary Care

## 2016-11-27 ENCOUNTER — Other Ambulatory Visit: Payer: Self-pay | Admitting: Internal Medicine

## 2016-11-27 VITALS — BP 146/76 | HR 79 | Temp 97.3°F | Wt 221.0 lb

## 2016-11-27 DIAGNOSIS — R0609 Other forms of dyspnea: Secondary | ICD-10-CM | POA: Diagnosis not present

## 2016-11-27 DIAGNOSIS — R0600 Dyspnea, unspecified: Secondary | ICD-10-CM

## 2016-11-27 DIAGNOSIS — R600 Localized edema: Secondary | ICD-10-CM

## 2016-11-27 DIAGNOSIS — Z1231 Encounter for screening mammogram for malignant neoplasm of breast: Secondary | ICD-10-CM

## 2016-11-27 LAB — BASIC METABOLIC PANEL
BUN: 13 mg/dL (ref 6–23)
CO2: 28 mEq/L (ref 19–32)
Calcium: 9 mg/dL (ref 8.4–10.5)
Chloride: 107 mEq/L (ref 96–112)
Creatinine, Ser: 1.02 mg/dL (ref 0.40–1.20)
GFR: 65.37 mL/min (ref >60.00)
Glucose, Bld: 164 mg/dL — ABNORMAL HIGH (ref 70–99)
Potassium: 3.8 mEq/L (ref 3.5–5.1)
Sodium: 141 mEq/L (ref 135–145)

## 2016-11-27 LAB — BRAIN NATRIURETIC PEPTIDE: Pro B Natriuretic peptide (BNP): 187 pg/mL — ABNORMAL HIGH (ref 0.0–100.0)

## 2016-11-27 NOTE — Progress Notes (Signed)
Subjective:    Patient ID: Sheila Long, female    DOB: [birth year], 82 y.o.   MRN: [id number]  HPI  Sheila Long is a 82 year old female with a history of obesity, PVD, neuropathy, GERD, CAD, Hypertension, Thoracic aortic aneurysm who presents today with multiple complaints.  1) Lower Extremity Swelling: History of bilateral lower extremity edema. Her recent episode began to the left lower extremity on Friday last week. She denies calf swelling and pain, long travel, fevers, chills, erythema. She's been elevated her feet every evening with improvement. She was riding in a car for 5.5 hours yesterday, but stopped frequently to walk. She does also report shortness of breath with chest discomfort  2) Upper Extremity Pain: Located to left upper extremity that starts around the mid deltoid with pain down to her left wrist. She denies injury, weakness, neck pain, shoulder pain. She did notice tingling in her fingers Friday last week. She's not taken anything for her pain.   3) Chest Discomfort: Cannot describe her discomfort. She denies her pain as sharp/shooting, pressure, and also denies abdominal pain, nausea, diaphroesis. Also with shortness of breath. The shortness of breath has been present for the past several months, but worse over the past several days. She will be out of breath when walking short distances. She denies cough, fevers. She's had several ECG's in 2017, all of which in NSR with t-wave abnormality,. LVH. She underwent echocardiogram in 2014 with normal LV function with mild LVH, valvular regurgitation which was trivial to mild. She was never a smoker and denies history of asthma. She denies cough, fevers, chills, esophageal reflux.  Review of Systems  Constitutional: Negative for chills, fatigue and fever.  HENT: Negative for congestion.   Respiratory: Positive for shortness of breath. Negative for chest tightness and wheezing.   Cardiovascular: Negative for chest pain.    Musculoskeletal: Positive for myalgias.       Past Medical History:  Diagnosis Date  . Allergic rhinitis due to pollen   . Aortic atherosclerosis ([facility])   . Coronary artery disease    Stent in 2000's ([place])  . GERD (gastroesophageal reflux disease)   . Hard of hearing   . Hypertension   . Neuropathy ([facility])   . Obstructive sleep apnea   . Osteoarthritis of both knees   . Peripheral vascular disease ([facility])   . Sarcoidosis ([facility])   . Thyroid nodule      Social History   Social History  . Marital status: Widowed    Spouse name: N/A  . Number of children: 0  . Years of education: N/A   Occupational History  . 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 Smoking status: Never Smoker  . Smokeless tobacco: Never Used  . Alcohol use No  . Drug use: No  . Sexual activity: Not on file   Other Topics Concern  . Not on file   Social History Narrative   Past living will   Needs to redo health care POA   Requests sister [employer] as decision makers   Would accept resuscitation   Not sure about tube feeds    Past Surgical History:  Procedure Laterality Date  . BIOPSY THYROID  2014   Dr Andee PolesVaught  . BREAST BIOPSY Left    surgical bx neg  . CARDIOVASCULAR STRESS TEST  05/12/2012   with dobutamine---negative. Normal LV function  . CORONARY ANGIOPLASTY  2006  .  JOINT REPLACEMENT Left 2000   hip  . JOINT REPLACEMENT Right 2007   hip    No family history on file.  Allergies  Allergen Reactions  . Tramadol Other (See Comments)    CONFUSION  . Sulfa Antibiotics   . Zocor [Simvastatin] Other (See Comments)    INTOLERANCE-MYALGIAS     Current Outpatient Prescriptions on File Prior to Visit  Medication Sig Dispense Refill  . acetaminophen (TYLENOL) 650 MG CR tablet Take 650 mg by mouth 3 (three) times daily.    Marland Kitchen. albuterol (PROVENTIL HFA) 108 (90 Base) MCG/ACT inhaler Inhale 2 puffs into the lungs every 4 (four) hours as needed for  wheezing or shortness of breath. 1 Inhaler 0  . amLODipine (NORVASC) 2.5 MG tablet Take 2.5 mg by mouth daily.    Marland Kitchen. aspirin 81 MG tablet Take 81 mg by mouth daily.    Marland Kitchen. loratadine (CLARITIN) 10 MG tablet Take 10 mg by mouth daily.    . metoprolol succinate (TOPROL-XL) 50 MG 24 hr tablet Take 50 mg by mouth 2 (two) times daily. Take with or immediately following a meal.     . montelukast (SINGULAIR) 10 MG tablet Take 1 tablet (10 mg total) by mouth daily as needed. 30 tablet 11  . pantoprazole (PROTONIX) 40 MG tablet Take 40 mg by mouth daily.    . ranitidine (ZANTAC) 150 MG tablet Take 150 mg by mouth 2 (two) times daily.      No current facility-administered medications on file prior to visit.     BP (!) 146/76   Pulse 79   Temp 97.3 F (36.3 C) (Oral)   Wt 221 lb (100.2 kg)   SpO2 98%   BMI 40.42 kg/m    Objective:   Physical Exam  Constitutional: She appears well-nourished. She does not appear ill.  Neck: Neck supple.  Cardiovascular: Normal rate and regular rhythm.   Pulses:      Dorsalis pedis pulses are 2+ on the right side, and 2+ on the left side.       Posterior tibial pulses are 2+ on the right side, and 2+ on the left side.  Mild left lower extremity edema, trace pitting.  Pulmonary/Chest: Effort normal and breath sounds normal. She has no wheezes.  No crackles  Musculoskeletal:       Left shoulder: She exhibits normal range of motion, no tenderness, no swelling, no deformity and no pain.       Cervical back: She exhibits normal range of motion, no tenderness and no pain.  Skin: Skin is warm and dry.          Assessment & Plan:  Chest Discomfort:  Denies pain, but does feel "sensation". Unsure of etiology. Numerous ECG's within 2017 stable. Lungs clear. No GERD symptoms.  Will await labs and echo results. Do not suspect ACS, PE.  Upper extremity pain:  History of neuropathy. Pain doesn't seem to be originating from c-spine.  Could be neuropathy given  the numbness. Good strength bilaterally, good ROM. Non tender. She's more concerned about her swelling and will notify if pain persists.  Morrie Sheldonlark,Katherine Kendal, NP

## 2016-11-27 NOTE — Progress Notes (Signed)
Pre visit review using our clinic review tool, if applicable. No additional management support is needed unless otherwise documented below in the visit note. 

## 2016-11-27 NOTE — Patient Instructions (Signed)
Complete lab work prior to leaving today. I will notify you of your results once received.   Stop by the front desk and speak with either Shirlee LimerickMarion or Revonda StandardAllison regarding your Echocardiogram.  Remember to elevate your legs at home. Consider wearing compression hose to help pull fluid upward.  It was a pleasure meeting you!

## 2016-11-28 ENCOUNTER — Ambulatory Visit (INDEPENDENT_AMBULATORY_CARE_PROVIDER_SITE_OTHER): Payer: PPO

## 2016-11-28 ENCOUNTER — Other Ambulatory Visit: Payer: Self-pay

## 2016-11-28 DIAGNOSIS — R0609 Other forms of dyspnea: Secondary | ICD-10-CM | POA: Diagnosis not present

## 2016-11-28 DIAGNOSIS — R0600 Dyspnea, unspecified: Secondary | ICD-10-CM | POA: Insufficient documentation

## 2016-11-28 DIAGNOSIS — R600 Localized edema: Secondary | ICD-10-CM | POA: Insufficient documentation

## 2016-11-28 NOTE — Assessment & Plan Note (Signed)
Long history of edema. Does have PVD. Trace pitting to left lower extremity. Given DOE coupled with lower extremity edema, will need to repeat echocardiogram. Check BNP and renal function. May require low dose lasix.

## 2016-11-28 NOTE — Assessment & Plan Note (Signed)
Present for the past several months, progressive since. Mild lower extremity edema to bilateral lower extremities, trace pitting to left. Now having problems with walking short distances. Reviewed echocardiogram from 2014, will need to repeat given symptoms with presence of edema. Ordered. Also check renal function today. Pulmonary exam unremarkable. May require low dose lasix.

## 2016-12-03 ENCOUNTER — Telehealth: Payer: Self-pay | Admitting: Internal Medicine

## 2016-12-03 NOTE — Telephone Encounter (Signed)
Spoken and notified patient of Kate's comments. Patient verbalized understanding. 

## 2016-12-03 NOTE — Telephone Encounter (Signed)
Message left for patient to return my call.  Please see result note.

## 2016-12-03 NOTE — Telephone Encounter (Signed)
Pt called back regarding echo  cb number is [phone]

## 2016-12-03 NOTE — Telephone Encounter (Signed)
Error

## 2016-12-03 NOTE — Telephone Encounter (Signed)
Pt returned call

## 2016-12-06 ENCOUNTER — Ambulatory Visit: Payer: PPO | Admitting: Internal Medicine

## 2016-12-20 ENCOUNTER — Emergency Department: Payer: PPO

## 2016-12-20 ENCOUNTER — Encounter: Payer: Self-pay | Admitting: Emergency Medicine

## 2016-12-20 ENCOUNTER — Emergency Department
Admission: EM | Admit: 2016-12-20 | Discharge: 2016-12-20 | Disposition: A | Discharge status: Home / Self Care | Payer: PPO | Attending: Student in an Organized Health Care Education/Training Program | Admitting: Student in an Organized Health Care Education/Training Program

## 2016-12-20 DIAGNOSIS — I2510 Atherosclerotic heart disease of native coronary artery without angina pectoris: Secondary | ICD-10-CM | POA: Diagnosis not present

## 2016-12-20 DIAGNOSIS — R0600 Dyspnea, unspecified: Secondary | ICD-10-CM

## 2016-12-20 DIAGNOSIS — M549 Dorsalgia, unspecified: Secondary | ICD-10-CM | POA: Insufficient documentation

## 2016-12-20 DIAGNOSIS — R0789 Other chest pain: Secondary | ICD-10-CM | POA: Insufficient documentation

## 2016-12-20 DIAGNOSIS — R0601 Orthopnea: Secondary | ICD-10-CM | POA: Insufficient documentation

## 2016-12-20 DIAGNOSIS — R059 Cough, unspecified: Secondary | ICD-10-CM

## 2016-12-20 DIAGNOSIS — I10 Essential (primary) hypertension: Secondary | ICD-10-CM | POA: Diagnosis not present

## 2016-12-20 DIAGNOSIS — R05 Cough: Secondary | ICD-10-CM

## 2016-12-20 DIAGNOSIS — R0609 Other forms of dyspnea: Secondary | ICD-10-CM | POA: Diagnosis not present

## 2016-12-20 LAB — BASIC METABOLIC PANEL
Anion gap: 4 — ABNORMAL LOW (ref 5–15)
BUN: 13 mg/dL (ref 6–20)
CO2: 26 mmol/L (ref 22–32)
Calcium: 9 mg/dL (ref 8.9–10.3)
Chloride: 110 mmol/L (ref 101–111)
Creatinine, Ser: 1.1 mg/dL — ABNORMAL HIGH (ref 0.44–1.00)
GFR calc Af Amer: 50 mL/min — ABNORMAL LOW (ref >60)
GFR calc non Af Amer: 43 mL/min — ABNORMAL LOW (ref >60)
Glucose, Bld: 129 mg/dL — ABNORMAL HIGH (ref 65–99)
Potassium: 3.9 mmol/L (ref 3.5–5.1)
Sodium: 140 mmol/L (ref 135–145)

## 2016-12-20 LAB — CBC
HCT: 42.1 % (ref 35.0–47.0)
Hemoglobin: 14.5 g/dL (ref 12.0–16.0)
MCH: 33.8 pg (ref 26.0–34.0)
MCHC: 34.4 g/dL (ref 32.0–36.0)
MCV: 98.1 fL (ref 80.0–100.0)
Platelets: 198 10*3/uL (ref 150–440)
RBC: 4.29 MIL/uL (ref 3.80–5.20)
RDW: 14.1 % (ref 11.5–14.5)
WBC: 8.9 10*3/uL (ref 3.6–11.0)

## 2016-12-20 LAB — INFLUENZA PANEL BY PCR (TYPE A & B)
Influenza A By PCR: NEGATIVE
Influenza B By PCR: NEGATIVE

## 2016-12-20 LAB — BRAIN NATRIURETIC PEPTIDE: B Natriuretic Peptide: 236 pg/mL — ABNORMAL HIGH (ref 0.0–100.0)

## 2016-12-20 LAB — TROPONIN I: Troponin I: <0.03 ng/mL (ref <0.03)

## 2016-12-20 MED ORDER — IPRATROPIUM-ALBUTEROL 0.5-2.5 (3) MG/3ML IN SOLN
3.0000 mL | Freq: Once | RESPIRATORY_TRACT | Status: AC
  Administered 2016-12-20: 3 mL via RESPIRATORY_TRACT

## 2016-12-20 MED ORDER — BENZONATATE 100 MG PO CAPS
100.0000 mg | ORAL_CAPSULE | Freq: Three times a day (TID) | ORAL | 0 refills | Status: DC | PRN
Start: 2016-12-20 — End: 2017-02-03

## 2016-12-20 MED ORDER — IPRATROPIUM-ALBUTEROL 0.5-2.5 (3) MG/3ML IN SOLN
3.0000 mL | Freq: Once | RESPIRATORY_TRACT | Status: DC
  Filled 2016-12-20: qty 3

## 2016-12-20 MED ORDER — FUROSEMIDE 40 MG PO TABS: 40.0000 mg | ORAL_TABLET | Freq: Every day | ORAL | 0 refills | Status: DC

## 2016-12-20 MED ORDER — FUROSEMIDE 10 MG/ML IJ SOLN
40.0000 mg | Freq: Once | INTRAMUSCULAR | Status: AC
  Administered 2016-12-20: 40 mg via INTRAVENOUS
  Filled 2016-12-20: qty 4

## 2016-12-20 MED ORDER — IPRATROPIUM-ALBUTEROL 0.5-2.5 (3) MG/3ML IN SOLN
3.0000 mL | Freq: Once | RESPIRATORY_TRACT | Status: AC
  Administered 2016-12-20: 3 mL via RESPIRATORY_TRACT
  Filled 2016-12-20: qty 3

## 2016-12-20 NOTE — ED Notes (Addendum)
Pt reports feeling "SOB", but O2 sats maintained between 97-100% while ambulating.

## 2016-12-20 NOTE — ED Provider Notes (Signed)
[facility] Emergency Department Provider Note    First MD Initiated Contact with Patient 12/20/16 [phone]     (approximate)  I have reviewed the triage vital signs and the nursing notes.   HISTORY  Chief Complaint Cough and Chest Pain    HPI Sheila Long is a 82 y.o. female  with a history of CAD as well as OSA presents with complaint of chest discomfort back pain shortness of breath and cough. Patient states that she is having flulike illness for the past week after visiting family in [place]. Was watching local news this morning and so reports of influenza therefore she became concerned that she had the flu. States that she has been having worsening orthopnea and lower extremity swelling. Has been taking her medications as directed. No recent fevers but does admit to chills. States she has had a nonproductive cough. Does have a history of bronchitis. Denies any abdominal pain nausea or vomiting.   Past Medical History:  Diagnosis Date  . Allergic rhinitis due to pollen   . Aortic atherosclerosis (HCC)   . Coronary artery disease    Stent in 2000's (DC)  . GERD (gastroesophageal reflux disease)   . Hard of hearing   . Hypertension   . Neuropathy (HCC)   . Obstructive sleep apnea   . Osteoarthritis of both knees   . Peripheral vascular disease (HCC)   . Sarcoidosis (HCC)   . Thyroid nodule    No family history on file. Past Surgical History:  Procedure Laterality Date  . BIOPSY THYROID  2014   Dr Andee PolesVaught  . BREAST BIOPSY Left    surgical bx neg  . CARDIOVASCULAR STRESS TEST  05/12/2012   with dobutamine---negative. Normal LV function  . CORONARY ANGIOPLASTY  2006  . JOINT REPLACEMENT Left 2000   hip  . JOINT REPLACEMENT Right 2007   hip   Patient Active Problem List   Diagnosis Date Noted  . Dyspnea on exertion 11/28/2016  . Lower extremity edema 11/28/2016  . Breast pain, left 10/15/2016  . Thigh pain 10/15/2016  . Familial  tremor 09/24/2016  . Sarcoidosis (HCC)   . Hypertension   . Allergic rhinitis due to pollen   . Coronary artery disease   . Aortic atherosclerosis (HCC)   . GERD (gastroesophageal reflux disease)   . Peripheral vascular disease (HCC)   . Osteoarthritis of both knees   . Obstructive sleep apnea   . Neuropathy (HCC)   . Aneurysm of thoracic aorta (HCC) 09/20/2015      Prior to Admission medications   Medication Sig Start Date End Date Taking? Authorizing Provider  acetaminophen (TYLENOL) 650 MG CR tablet Take 650 mg by mouth 3 (three) times daily.   Yes Historical Provider, MD  albuterol (PROVENTIL HFA) 108 (90 Base) MCG/ACT inhaler Inhale 2 puffs into the lungs every 4 (four) hours as needed for wheezing or shortness of breath. 09/14/16  Yes Sharman CheekPhillip Stafford, MD  amLODipine (NORVASC) 2.5 MG tablet Take 2.5 mg by mouth daily.   Yes Historical Provider, MD  aspirin 81 MG tablet Take 81 mg by mouth daily.   Yes Historical Provider, MD  loratadine (CLARITIN) 10 MG tablet Take 10 mg by mouth daily.   Yes Historical Provider, MD  metoprolol succinate (TOPROL-XL) 50 MG 24 hr tablet Take 50 mg by mouth 2 (two) times daily. Take with or immediately following a meal.    Yes Historical Provider, MD  montelukast (SINGULAIR) 10 MG tablet Take  1 tablet (10 mg total) by mouth daily as needed. 09/24/16  Yes Karie Schwalbeichard I Letvak, MD  pantoprazole (PROTONIX) 40 MG tablet Take 40 mg by mouth daily.   Yes Historical Provider, MD  ranitidine (ZANTAC) 150 MG tablet Take 150 mg by mouth 2 (two) times daily as needed for heartburn.    Yes Historical Provider, MD  benzonatate (TESSALON PERLES) 100 MG capsule Take 1 capsule (100 mg total) by mouth 3 (three) times daily as needed for cough. 12/20/16   Willy EddyPatrick Lua Feng, MD  furosemide (LASIX) 40 MG tablet Take 1 tablet (40 mg total) by mouth daily. 12/20/16 12/20/17  Willy EddyPatrick Sybella Harnish, MD    Allergies Tramadol; Sulfa antibiotics; and Zocor [simvastatin]    Social  History Social History  Substance Use Topics  . Smoking status: Never Smoker  . Smokeless tobacco: Never Used  . Alcohol use No    Review of Systems Patient denies headaches, rhinorrhea, blurry vision, numbness, shortness of breath, chest pain, edema, cough, abdominal pain, nausea, vomiting, diarrhea, dysuria, fevers, rashes or hallucinations unless otherwise stated above in HPI. ____________________________________________   PHYSICAL EXAM:  VITAL SIGNS: Vitals:   12/20/16 1325 12/20/16 1326  BP:  133/73  Pulse:  79  Resp:    Temp: 97.5 F (36.4 C)     Constitutional: Alert and oriented. Well appearing and in no acute distress. Eyes: Conjunctivae are normal. PERRL. EOMI. Head: Atraumatic. Nose: No congestion/rhinnorhea. Mouth/Throat: Mucous membranes are moist.  Oropharynx non-erythematous. Neck: No stridor. Painless ROM. No cervical spine tenderness to palpation Hematological/Lymphatic/Immunilogical: No cervical lymphadenopathy. Cardiovascular: Normal rate, regular rhythm. Soft holosystolic murmur.  Good peripheral circulation. Respiratory: Normal respiratory effort.  No retractions. Lungs with coarse bibasilar breath sounds with good air movement throughout.   Gastrointestinal: Soft and nontender. No distention. No abdominal bruits. No CVA tenderness. Genitourinary:  Musculoskeletal: No lower extremity tenderness nor edema.  No joint effusions. Neurologic:  Normal speech and language. No gross focal neurologic deficits are appreciated. No gait instability. Skin:  Skin is warm, dry and intact. No rash noted. Psychiatric: Mood and affect are normal. Speech and behavior are normal.  ____________________________________________   LABS (all labs ordered are listed, but only abnormal results are displayed)  Results for orders placed or performed during the hospital encounter of 12/20/16 (from the past 24 hour(s))  Basic metabolic panel     Status: Abnormal   Collection  Time: 12/20/16  9:27 AM  Result Value Ref Range   Sodium 140 135 - 145 mmol/L   Potassium 3.9 3.5 - 5.1 mmol/L   Chloride 110 101 - 111 mmol/L   CO2 26 22 - 32 mmol/L   Glucose, Bld 129 (H) 65 - 99 mg/dL   BUN 13 6 - 20 mg/dL   Creatinine, Ser 4.091.10 (H) 0.44 - 1.00 mg/dL   Calcium 9.0 8.9 - 81.110.3 mg/dL   GFR calc non Af Amer 43 (L) >60 mL/min   GFR calc Af Amer 50 (L) >60 mL/min   Anion gap 4 (L) 5 - 15  CBC     Status: None   Collection Time: 12/20/16  9:27 AM  Result Value Ref Range   WBC 8.9 3.6 - 11.0 K/uL   RBC 4.29 3.80 - 5.20 MIL/uL   Hemoglobin 14.5 12.0 - 16.0 g/dL   HCT 91.442.1 78.235.0 - 95.647.0 %   MCV 98.1 80.0 - 100.0 fL   MCH 33.8 26.0 - 34.0 pg   MCHC 34.4 32.0 - 36.0 g/dL   RDW 21.314.1  11.5 - 14.5 %   Platelets 198 150 - 440 K/uL  Troponin I     Status: None   Collection Time: 12/20/16  9:27 AM  Result Value Ref Range   Troponin I <0.03 <0.03 ng/mL  Brain natriuretic peptide     Status: Abnormal   Collection Time: 12/20/16  9:27 AM  Result Value Ref Range   B Natriuretic Peptide 236.0 (H) 0.0 - 100.0 pg/mL  Influenza panel by PCR (type A & B, H1N1)     Status: None   Collection Time: 12/20/16 11:26 AM  Result Value Ref Range   Influenza A By PCR NEGATIVE NEGATIVE   Influenza B By PCR NEGATIVE NEGATIVE   ____________________________________________  EKG My review and personal interpretation at Time: 9:12   Indication: sob  Rate: 75  Rhythm: nsr Axis: normal Other: normal intervals, no acute ischemia ____________________________________________  RADIOLOGY  I personally reviewed all radiographic images ordered to evaluate for the above acute complaints and reviewed radiology reports and findings.  These findings were personally discussed with the patient.  Please see medical record for radiology report. ____________________________________________   PROCEDURES  Procedure(s) performed:  Procedures    Critical Care performed:  no ____________________________________________   INITIAL IMPRESSION / ASSESSMENT AND PLAN / ED COURSE  Pertinent labs & imaging results that were available during my care of the patient were reviewed by me and considered in my medical decision making (see chart for details).  DDX: Asthma, copd, CHF, pna, ptx, malignancy, Pe, anemia   Sheila Long is a 82 y.o. who presents to the ED with concern for flu with c/o sob and orthopnea.  Patient is AFVSS in ED. Exam as above. Given current presentation have considered the above differential.  EKG without evidence of acute ischemia. CXR with evidence of mild chf.  Will dose IV lasix.  Will provide nebulizer for cough.  No evidence of pna.  Will check BNP and reassess.  The patient will be placed on continuous pulse oximetry and telemetry for monitoring.  Laboratory evaluation will be sent to evaluate for the above complaints.     Clinical Course as of Dec 20 1946  Fri Dec 20, 2016  1232 Flu negative. Has mild elevation of BNP. She remains he mechanically stable. Will ambulate with pulse ox symmetry to evaluate for any acute hypoxia.  [PR]  1252 I spoke with Dr. Gwen Pounds who agrees with dose of IV lasix.  Recommends starting patient on 40 mg lasix daily with close follow up in their clinic.  [PR]  1326 Patient reassessed.  BP stable.  Has urinated several times and states she is feeling much better.  Requesting DC home.  Will DC with lasix and antitussive.  [PR]    Clinical Course User Index [PR] [name], MD     ____________________________________________   FINAL CLINICAL IMPRESSION(S) / ED DIAGNOSES  Final diagnoses:  Cough  Dyspnea on exertion  Orthopnea      NEW MEDICATIONS STARTED DURING THIS VISIT:  Discharge Medication List as of 12/20/2016  1:30 PM    START taking these medications   Details  benzonatate (TESSALON PERLES) 100 MG capsule Take 1 capsule (100 mg total) by mouth 3 (three) times daily as  needed for cough., Starting Fri 12/20/2016, Print    furosemide (LASIX) 40 MG tablet Take 1 tablet (40 mg total) by mouth daily., Starting Fri 12/20/2016, Until Sat 12/20/2017, Print         Note:  This document was prepared  using Conservation officer, historic buildings and may include unintentional dictation errors.    [name], MD 12/20/16 [phone]

## 2016-12-20 NOTE — ED Triage Notes (Signed)
Pt to ed with c/o sharp chest pain and back pain, sob, cough, this am.

## 2016-12-24 ENCOUNTER — Ambulatory Visit (INDEPENDENT_AMBULATORY_CARE_PROVIDER_SITE_OTHER): Payer: PPO | Admitting: Family Medicine

## 2016-12-24 ENCOUNTER — Encounter: Payer: Self-pay | Admitting: Family Medicine

## 2016-12-24 ENCOUNTER — Telehealth: Payer: Self-pay | Admitting: Internal Medicine

## 2016-12-24 VITALS — BP 140/70 | HR 83 | Temp 98.2°F | Ht 62.0 in | Wt 215.5 lb

## 2016-12-24 DIAGNOSIS — I10 Essential (primary) hypertension: Secondary | ICD-10-CM

## 2016-12-24 DIAGNOSIS — R600 Localized edema: Secondary | ICD-10-CM

## 2016-12-24 MED ORDER — AMLODIPINE BESYLATE 5 MG PO TABS: 5.0000 mg | ORAL_TABLET | Freq: Every day | ORAL | 3 refills | Status: DC

## 2016-12-24 NOTE — Assessment & Plan Note (Signed)
In pt with hx of vascular dz and aortic aneurysm  Improved with inc of amlodipine today to 5 mg  Will continue 5 mg -see AVS -pt voiced understanding Disc imp of low sodium diet Newly on lasix since ED visit -will check bmp today  Has f/u with cardiology in a week for her sob/edema (has had echo) Enc her also to f/u with PCP  inst to alert us of any side effects with new dose of medication

## 2016-12-24 NOTE — Assessment & Plan Note (Addendum)
Pt states this is improved since her ED visit recently when she was started on lasix 40 mg  Feels better and less sob  For cardiology f/u in 1 wk  Lab today for bmp  We are inc her amlodipine today -hopefully will not inc her edema again

## 2016-12-24 NOTE — Telephone Encounter (Signed)
At the time of the call it sounds like she had not taken her bp med - please call her and see how her bp is now that she has taken it  Please let me know Will cc to her PCP as well

## 2016-12-24 NOTE — Telephone Encounter (Signed)
Spoke with pt's niece and pt took med about 45 min ago and then laid down she has taken pt's bp and it's gone up some it's 197/78, pt and niece are concerned about waiting until tomorrow at 6:00pm to be seen, please advise   Routed to PCP too

## 2016-12-24 NOTE — Telephone Encounter (Signed)
Please instruct her to take an extra 2.5 mg of amlodipine now  Put her in my 4:15 slot if it is not taken thanks

## 2016-12-24 NOTE — Progress Notes (Signed)
Pre visit review using our clinic review tool, if applicable. No additional management support is needed unless otherwise documented below in the visit note. 

## 2016-12-24 NOTE — Telephone Encounter (Signed)
Niece will take pt's BP and call us back and let us know the results

## 2016-12-24 NOTE — Telephone Encounter (Signed)
[facility] - Client TELEPHONE ADVICE RECORD [facility] Patient Name: Thereasa DistanceKATHLEEN WILLIAMS ON DOB: 06-27-1924 Initial Comment Caller has HBP 192/90 and wants to to seen by dr Nurse Assessment Nurse: Debera Latalston, RN, Tinnie GensJeffrey Date/Time (Eastern Time): 12/24/2016 11:15:19 AM Confirm and document reason for call. If symptomatic, describe symptoms. ---Caller has high B/P 192/90 and wants to to seen by dr. B/P reading from this morning. Does the patient have any new or worsening symptoms? ---Yes Will a triage be completed? ---Yes Related visit to physician within the last 2 weeks? ---Yes Does the PT have any chronic conditions? (i.e. diabetes, asthma, etc.) ---Yes List chronic conditions. ---HTN Is this a behavioral health or substance abuse call? ---No Guidelines Guideline Title Affirmed Question Affirmed Notes High Blood Pressure BP # 180/110 Final Disposition User See Physician within 24 Hours EphesusRalston, RN, [employer] CorporationJeffrey Comments Caller had not taken B/P meds yet this morning. Referrals REFERRED TO PCP OFFICE Disagree/Comply: Comply

## 2016-12-24 NOTE — Patient Instructions (Addendum)
Increase your amlodipine from 2.5 mg daily to 5 mg daily  The pills you have left (amlodipine 2.5) take 2 pills once daily  When you run out - get the px from the pharmacy for 5 mg pills and then you will just take one a day  Continue other medicines the same  Labs today for the lasix to make sure potassium is not low   Follow up with your cardiologist as planned next week   Try to avoid salt / sodium - it makes you swell and makes your blood pressure go up   Take care of yourself  If you have any problems or side effects please let us know   We will schedule a follow up visit with Dr Alphonsus SiasLetvak in 2-3 weeks

## 2016-12-24 NOTE — Telephone Encounter (Signed)
Pt notified to take an extra dose of amlodipine and appt changed to 4:15 today

## 2016-12-24 NOTE — Progress Notes (Signed)
Subjective:    Patient ID: Sheila Long, female    DOB: 03-12-1924, 82 y.o.   MRN: [id number]  HPI Here for elevated bp   Per pt bp was 192/90 before medication this am  Then 197/78 after medication  Started yesterday - felt funny / and vision seemed a bit blurry  Had not missed any medication   Wt Readings from Last 3 Encounters:  12/24/16 215 lb 8 oz (97.8 kg)  12/20/16 222 lb (100.7 kg)  11/27/16 221 lb (100.2 kg)   Lasix took off some fluid    Was in ED recently - for edema and sob  Is always sob on exertion  Was given lasix  Had echo- stable   Will see cardiologist next week    She usually takes amlodipine 2.5 mg daily  Metoprolol xl 50 mg bid   She was inst to take an extra 2.5 of amlodipine mid day today  BP Readings from Last 3 Encounters:  12/24/16 (!) 154/78  12/20/16 133/73  11/27/16 (!) 146/76   Improved now  Re check BP: 140/70  Even better   Pulse Readings from Last 3 Encounters:  12/24/16 83  12/20/16 79  11/27/16 79    Of note-per chart pt has hx of CAD and thoracic aneurysm - being watched  Also PVD She takes a daily asa and sees Sheila Long     Chemistry      Component Value Date/Time   NA 140 12/20/2016 0927   NA 141 03/23/2015 0842   K 3.9 12/20/2016 0927   K 4.0 03/23/2015 0842   CL 110 12/20/2016 0927   CL 107 03/23/2015 0842   CO2 26 12/20/2016 0927   CO2 26 03/23/2015 0842   BUN 13 12/20/2016 0927   BUN 17 03/23/2015 0842   CREATININE 1.10 (H) 12/20/2016 0927   CREATININE 1.05 (H) 03/23/2015 0842      Component Value Date/Time   CALCIUM 9.0 12/20/2016 0927   CALCIUM 9.2 03/23/2015 0842   ALKPHOS 78 09/14/2016 1259   ALKPHOS 78 03/23/2015 0842   AST 20 09/14/2016 1259   AST 26 03/23/2015 0842   ALT 14 09/14/2016 1259   ALT 18 03/23/2015 0842   BILITOT 1.3 (H) 09/14/2016 1259   BILITOT 1.1 03/23/2015 0842      No longer takes any nsaids   Patient Active Problem List   Diagnosis Date Noted  . Dyspnea on  exertion 11/28/2016  . Lower extremity edema 11/28/2016  . Breast pain, left 10/15/2016  . Thigh pain 10/15/2016  . Familial tremor 09/24/2016  . Sarcoidosis (HCC)   . Hypertension   . Allergic rhinitis due to pollen   . Coronary artery disease   . Aortic atherosclerosis (HCC)   . GERD (gastroesophageal reflux disease)   . Peripheral vascular disease (HCC)   . Osteoarthritis of both knees   . Obstructive sleep apnea   . Neuropathy (HCC)   . Aneurysm of thoracic aorta (HCC) 09/20/2015   Past Medical History:  Diagnosis Date  . Allergic rhinitis due to pollen   . Aortic atherosclerosis (HCC)   . Coronary artery disease    Stent in 2000's (DC)  . GERD (gastroesophageal reflux disease)   . Hard of hearing   . Hypertension   . Neuropathy (HCC)   . Obstructive sleep apnea   . Osteoarthritis of both knees   . Peripheral vascular disease (HCC)   . Sarcoidosis (HCC)   . Thyroid nodule  Past Surgical History:  Procedure Laterality Date  . BIOPSY THYROID  2014   Sheila Andee Poles  . BREAST BIOPSY Left    surgical bx neg  . CARDIOVASCULAR STRESS TEST  05/12/2012   with dobutamine---negative. Normal LV function  . CORONARY ANGIOPLASTY  2006  . JOINT REPLACEMENT Left 2000   hip  . JOINT REPLACEMENT Right 2007   hip   Social History  Substance Use Topics  . Smoking status: Never Smoker  . Smokeless tobacco: Never Used  . Alcohol use No   No family history on file. Allergies  Allergen Reactions  . Tramadol Other (See Comments)    CONFUSION  . Sulfa Antibiotics     Pt dont remember reaction  . Zocor [Simvastatin] Other (See Comments)    INTOLERANCE-MYALGIAS    Current Outpatient Prescriptions on File Prior to Visit  Medication Sig Dispense Refill  . acetaminophen (TYLENOL) 650 MG CR tablet Take 650 mg by mouth 3 (three) times daily.    Marland Kitchen albuterol (PROVENTIL HFA) 108 (90 Base) MCG/ACT inhaler Inhale 2 puffs into the lungs every 4 (four) hours as needed for wheezing or  shortness of breath. 1 Inhaler 0  . aspirin 81 MG tablet Take 81 mg by mouth daily.    . benzonatate (TESSALON PERLES) 100 MG capsule Take 1 capsule (100 mg total) by mouth 3 (three) times daily as needed for cough. 20 capsule 0  . furosemide (LASIX) 40 MG tablet Take 1 tablet (40 mg total) by mouth daily. 14 tablet 0  . loratadine (CLARITIN) 10 MG tablet Take 10 mg by mouth daily.    . metoprolol succinate (TOPROL-XL) 50 MG 24 hr tablet Take 50 mg by mouth 2 (two) times daily. Take with or immediately following a meal.     . montelukast (SINGULAIR) 10 MG tablet Take 1 tablet (10 mg total) by mouth daily as needed. 30 tablet 11  . pantoprazole (PROTONIX) 40 MG tablet Take 40 mg by mouth daily.    . ranitidine (ZANTAC) 150 MG tablet Take 150 mg by mouth 2 (two) times daily as needed for heartburn.      No current facility-administered medications on file prior to visit.     Review of Systems    Review of Systems  Constitutional: Negative for fever, appetite change,  and unexpected weight change.  Eyes: Negative for pain and visual disturbance. (improved) Respiratory: Negative for cough and pos for baseline exertional shortness of breath.   Cardiovascular: Negative for cp or palpitations   neg for pnd or orthopnea, pos for recent pedal edema that is resolved  Gastrointestinal: Negative for nausea, diarrhea and constipation.  Genitourinary: Negative for urgency and frequency.  Skin: Negative for pallor or rash   Neurological: Negative for weakness, light-headedness, numbness and headaches. (headache has improved) Hematological: Negative for adenopathy. Does not bruise/bleed easily.  Psychiatric/Behavioral: Negative for dysphoric mood. The patient is not nervous/anxious.     m Objective:   Physical Exam  Constitutional: She appears well-developed and well-nourished. No distress.  Well appearing elderly female  HENT:  Head: Normocephalic and atraumatic.  Mouth/Throat: Oropharynx is clear  and moist.  Eyes: Conjunctivae and EOM are normal. Pupils are equal, round, and reactive to light.  Neck: Normal range of motion. Neck supple. No JVD present. Carotid bruit is not present. No thyromegaly present.  Cardiovascular: Normal rate, regular rhythm, normal heart sounds and intact distal pulses.  Exam reveals no gallop.   Pulmonary/Chest: Effort normal and breath sounds normal. No  respiratory distress. She has no wheezes. She has no rales.  No crackles Good air exch  Abdominal: Soft. Bowel sounds are normal. She exhibits no distension, no abdominal bruit and no mass. There is no tenderness.  Musculoskeletal: She exhibits no edema or tenderness.  Poor rom of hips  Lymphadenopathy:    She has no cervical adenopathy.  Neurological: She is alert. She has normal reflexes. No cranial nerve deficit. She exhibits normal muscle tone. Coordination normal.  Skin: Skin is warm and dry. No rash noted. No pallor.  Psychiatric: Her speech is normal and behavior is normal. Her mood appears anxious. Her affect is not blunt, not labile and not inappropriate.  Mildly anxious Pleasant   Instructions were repeated several times and printed out for her , she seems to have some cognitive slowing -states she will review everything with a family member as well             Assessment & Plan:   Problem List Items Addressed This Visit      Cardiovascular and Mediastinum   Hypertension - Primary    In pt with hx of vascular dz and aortic aneurysm  Improved with inc of amlodipine today to 5 mg  Will continue 5 mg -see AVS -pt voiced understanding Disc imp of low sodium diet Newly on lasix since ED visit -will check bmp today  Has f/u with cardiology in a week for her sob/edema (has had echo) Enc her also to f/u with PCP  inst to alert us of any side effects with new dose of medication       Relevant Medications   amLODipine (NORVASC) 5 MG tablet   Other Relevant Orders   Basic metabolic panel      Other   Lower extremity edema    Pt states this is improved since her ED visit recently when she was started on lasix 40 mg  Feels better and less sob  For cardiology f/u in 1 wk  Lab today for bmp  We are inc her amlodipine today -hopefully will not inc her edema again

## 2016-12-24 NOTE — Telephone Encounter (Signed)
Pt has appt 12/25/16 at 6 pm with Dr Milinda Antisower.

## 2016-12-25 ENCOUNTER — Encounter: Payer: Self-pay | Admitting: *Deleted

## 2016-12-25 ENCOUNTER — Ambulatory Visit: Payer: Self-pay | Admitting: Family Medicine

## 2016-12-25 LAB — BASIC METABOLIC PANEL
BUN: 14 mg/dL (ref 6–23)
CO2: 29 mEq/L (ref 19–32)
Calcium: 9.4 mg/dL (ref 8.4–10.5)
Chloride: 105 mEq/L (ref 96–112)
Creatinine, Ser: 1.07 mg/dL (ref 0.40–1.20)
GFR: 61.85 mL/min (ref >60.00)
Glucose, Bld: 102 mg/dL — ABNORMAL HIGH (ref 70–99)
Potassium: 4 mEq/L (ref 3.5–5.1)
Sodium: 143 mEq/L (ref 135–145)

## 2016-12-26 ENCOUNTER — Telehealth: Payer: Self-pay | Admitting: Internal Medicine

## 2016-12-26 NOTE — Telephone Encounter (Signed)
No, it may take some time to respond completely to the med changes that were just done Tell her to come in earlier if she is consistently over 170/100

## 2016-12-26 NOTE — Telephone Encounter (Signed)
I spoke to patient and she scheduled appointment on 02/06/17.  Patient said her blood pressure has been up.  She said yesterday it was 167/84.  Do you want to see her sooner?

## 2016-12-26 NOTE — Telephone Encounter (Signed)
I spoke to patient and she said she would watch her blood pressure and let us know if it's consistently over 170/100.

## 2016-12-27 ENCOUNTER — Ambulatory Visit: Payer: PPO

## 2016-12-31 DIAGNOSIS — I10 Essential (primary) hypertension: Secondary | ICD-10-CM | POA: Diagnosis not present

## 2016-12-31 DIAGNOSIS — R0609 Other forms of dyspnea: Secondary | ICD-10-CM | POA: Diagnosis not present

## 2016-12-31 DIAGNOSIS — E785 Hyperlipidemia, unspecified: Secondary | ICD-10-CM | POA: Diagnosis not present

## 2016-12-31 DIAGNOSIS — I2510 Atherosclerotic heart disease of native coronary artery without angina pectoris: Secondary | ICD-10-CM | POA: Diagnosis not present

## 2016-12-31 DIAGNOSIS — R0602 Shortness of breath: Secondary | ICD-10-CM | POA: Diagnosis not present

## 2016-12-31 DIAGNOSIS — R600 Localized edema: Secondary | ICD-10-CM | POA: Diagnosis not present

## 2017-01-03 ENCOUNTER — Ambulatory Visit: Payer: PPO

## 2017-01-27 ENCOUNTER — Ambulatory Visit: Payer: PPO | Admitting: Internal Medicine

## 2017-01-29 DIAGNOSIS — R0609 Other forms of dyspnea: Secondary | ICD-10-CM | POA: Diagnosis not present

## 2017-01-29 DIAGNOSIS — E785 Hyperlipidemia, unspecified: Secondary | ICD-10-CM | POA: Diagnosis not present

## 2017-01-29 DIAGNOSIS — R600 Localized edema: Secondary | ICD-10-CM | POA: Diagnosis not present

## 2017-01-29 DIAGNOSIS — I10 Essential (primary) hypertension: Secondary | ICD-10-CM | POA: Diagnosis not present

## 2017-01-29 DIAGNOSIS — D860 Sarcoidosis of lung: Secondary | ICD-10-CM | POA: Diagnosis not present

## 2017-01-29 DIAGNOSIS — Z79899 Other long term (current) drug therapy: Secondary | ICD-10-CM | POA: Diagnosis not present

## 2017-01-29 DIAGNOSIS — I2510 Atherosclerotic heart disease of native coronary artery without angina pectoris: Secondary | ICD-10-CM | POA: Diagnosis not present

## 2017-01-29 DIAGNOSIS — R0602 Shortness of breath: Secondary | ICD-10-CM | POA: Diagnosis not present

## 2017-02-03 ENCOUNTER — Ambulatory Visit (INDEPENDENT_AMBULATORY_CARE_PROVIDER_SITE_OTHER): Payer: PPO | Admitting: Internal Medicine

## 2017-02-03 ENCOUNTER — Encounter: Payer: Self-pay | Admitting: Internal Medicine

## 2017-02-03 VITALS — BP 130/72 | HR 71 | Temp 97.4°F | Wt 219.0 lb

## 2017-02-03 DIAGNOSIS — K219 Gastro-esophageal reflux disease without esophagitis: Secondary | ICD-10-CM | POA: Diagnosis not present

## 2017-02-03 DIAGNOSIS — D869 Sarcoidosis, unspecified: Secondary | ICD-10-CM

## 2017-02-03 DIAGNOSIS — I739 Peripheral vascular disease, unspecified: Secondary | ICD-10-CM

## 2017-02-03 DIAGNOSIS — I10 Essential (primary) hypertension: Secondary | ICD-10-CM

## 2017-02-03 DIAGNOSIS — I2729 Other secondary pulmonary hypertension: Secondary | ICD-10-CM

## 2017-02-03 DIAGNOSIS — M170 Bilateral primary osteoarthritis of knee: Secondary | ICD-10-CM

## 2017-02-03 DIAGNOSIS — I700 Atherosclerosis of aorta: Secondary | ICD-10-CM

## 2017-02-03 MED ORDER — ACETAMINOPHEN-CODEINE #3 300-30 MG PO TABS: 1.0000 | ORAL_TABLET | Freq: Three times a day (TID) | ORAL | 0 refills | Status: DC | PRN

## 2017-02-03 MED ORDER — LORAZEPAM 0.5 MG PO TABS: 0.5000 mg | ORAL_TABLET | Freq: Every evening | ORAL | 0 refills | Status: DC | PRN

## 2017-02-03 NOTE — Patient Instructions (Signed)
Please try the tylenol #3 when your knee pain is very bad. You can call for an appointment for a cortisone shot if it is not getting any better--or it worsens.

## 2017-02-03 NOTE — Assessment & Plan Note (Signed)
Will give her tylenol #3--- consider repeat joint injection if worse

## 2017-02-03 NOTE — Progress Notes (Signed)
Subjective:    Patient ID: Sheila Long, female    DOB: 10-02-1924, 83 y.o.   MRN: [id number]  HPI Here for follow up of chronic health conditions  She feels she is doing okay Has her usual aches and pains--especially in hips and knees Right knee is the worst--wonders about cortisone shots (last about 5 months ago) Ortho wasn't doing this Has tylenol #3 and it helps--but has run out  Was put on furosemide 40mg  daily by Dr Darrold JunkerParaschos Was then told to cut it in half due to renal function change She stopped it altogether Edema not a problem--- stable DOE with walking, etc  BP had been elevated for a week or so Hard to control May have been related to worsened pain at that time  No regular heartburn Just has to be careful with what she eats No significant dysphagia  Leg pain persist May be mostly in knee but elsewhere in legs as well--with walking  Some cough and mucus Stable DOE but not really worse  Current Outpatient Prescriptions on File Prior to Visit  Medication Sig Dispense Refill  . acetaminophen (TYLENOL) 650 MG CR tablet Take 650 mg by mouth 3 (three) times daily.    Marland Kitchen. albuterol (PROVENTIL HFA) 108 (90 Base) MCG/ACT inhaler Inhale 2 puffs into the lungs every 4 (four) hours as needed for wheezing or shortness of breath. 1 Inhaler 0  . amLODipine (NORVASC) 5 MG tablet Take 1 tablet (5 mg total) by mouth daily. 30 tablet 3  . aspirin 81 MG tablet Take 81 mg by mouth daily.    . metoprolol succinate (TOPROL-XL) 50 MG 24 hr tablet Take 50 mg by mouth 2 (two) times daily. Take with or immediately following a meal.     . pantoprazole (PROTONIX) 40 MG tablet Take 40 mg by mouth daily.    . ranitidine (ZANTAC) 150 MG tablet Take 150 mg by mouth 2 (two) times daily as needed for heartburn.      No current facility-administered medications on file prior to visit.     Allergies  Allergen Reactions  . Tramadol Other (See Comments)    CONFUSION  . Sulfa Antibiotics      Pt dont remember reaction  . Zocor [Simvastatin] Other (See Comments)    INTOLERANCE-MYALGIAS     Past Medical History:  Diagnosis Date  . Allergic rhinitis due to pollen   . Aortic atherosclerosis (HCC)   . Coronary artery disease    Stent in 2000's (DC)  . GERD (gastroesophageal reflux disease)   . Hard of hearing   . Hypertension   . Neuropathy (HCC)   . Obstructive sleep apnea   . Osteoarthritis of both knees   . Peripheral vascular disease (HCC)   . Sarcoidosis (HCC)   . Thyroid nodule     Past Surgical History:  Procedure Laterality Date  . BIOPSY THYROID  2014   Dr Andee PolesVaught  . BREAST BIOPSY Left    surgical bx neg  . CARDIOVASCULAR STRESS TEST  05/12/2012   with dobutamine---negative. Normal LV function  . CORONARY ANGIOPLASTY  2006  . JOINT REPLACEMENT Left 2000   hip  . JOINT REPLACEMENT Right 2007   hip    No family history on file.  Social History   Social History  . Marital status: Widowed    Spouse name: N/A  . Number of children: 0  . Years of education: N/A   Occupational History  . 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 Smoking status: Never Smoker  . Smokeless tobacco: Never Used  . Alcohol use No  . Drug use: No  . Sexual activity: Not on file   Other Topics Concern  . Not on file   Social History Narrative   Past living will   Needs to redo health care POA   Requests sister [employer] as decision makers   Would accept resuscitation   Not sure about tube feeds   Review of Systems Has some constipation--does well with Smooth move tea Usually sleeps okay but not good sometimes. Uses the lorazepam rarely--- not for mood but for sleep (several times a year)    Objective:   Physical Exam  Constitutional: She appears well-nourished. No distress.  Neck: No thyromegaly present.  Cardiovascular: Normal rate and regular rhythm.  Exam reveals no gallop.   No murmur heard. Feet warm Faint  pedal pulses  Pulmonary/Chest: Effort normal. No respiratory distress. She has no wheezes. She has no rales.  Musculoskeletal: She exhibits no edema.  Lymphadenopathy:    She has no cervical adenopathy.  Psychiatric: She has a normal mood and affect. Her behavior is normal.          Assessment & Plan:

## 2017-02-03 NOTE — Assessment & Plan Note (Signed)
Seems to be doing okay with Rx

## 2017-02-03 NOTE — Assessment & Plan Note (Signed)
Has exertional leg pain---hard to tell if arthritic or vascular

## 2017-02-03 NOTE — Assessment & Plan Note (Signed)
Has disease and thrombus Just on ASA daily BP control is better

## 2017-02-03 NOTE — Progress Notes (Signed)
Pre visit review using our clinic review tool, if applicable. No additional management support is needed unless otherwise documented below in the visit note. 

## 2017-02-03 NOTE — Assessment & Plan Note (Signed)
Mild Had furosemide but stopped due to slight bump in creatinine Edema is not bad Will have her keep it only for prn use if edema worsens

## 2017-02-03 NOTE — Assessment & Plan Note (Signed)
Was high briefly--but better now ?related to pain  no changes needed now  BP Readings from Last 3 Encounters:  02/03/17 130/72  12/24/16 140/70  12/20/16 133/73

## 2017-02-03 NOTE — Assessment & Plan Note (Signed)
Some cough but otherwise seems to be okay without Rx

## 2017-02-06 ENCOUNTER — Ambulatory Visit: Payer: PPO | Admitting: Internal Medicine

## 2017-02-14 DIAGNOSIS — M179 Osteoarthritis of knee, unspecified: Secondary | ICD-10-CM | POA: Diagnosis not present

## 2017-02-14 DIAGNOSIS — M130 Polyarthritis, unspecified: Secondary | ICD-10-CM | POA: Diagnosis not present

## 2017-02-14 DIAGNOSIS — E8881 Metabolic syndrome: Secondary | ICD-10-CM | POA: Diagnosis not present

## 2017-02-14 DIAGNOSIS — D869 Sarcoidosis, unspecified: Secondary | ICD-10-CM | POA: Diagnosis not present

## 2017-02-14 DIAGNOSIS — M25569 Pain in unspecified knee: Secondary | ICD-10-CM | POA: Diagnosis not present

## 2017-03-17 ENCOUNTER — Ambulatory Visit
Admission: RE | Admit: 2017-03-17 | Discharge: 2017-03-17 | Disposition: A | Discharge status: Home / Self Care | Payer: PPO | Source: Ambulatory Visit | Attending: Internal Medicine | Admitting: Internal Medicine

## 2017-03-17 DIAGNOSIS — Z1231 Encounter for screening mammogram for malignant neoplasm of breast: Secondary | ICD-10-CM

## 2017-03-17 DIAGNOSIS — Z79899 Other long term (current) drug therapy: Secondary | ICD-10-CM | POA: Diagnosis not present

## 2017-03-24 DIAGNOSIS — H04123 Dry eye syndrome of bilateral lacrimal glands: Secondary | ICD-10-CM | POA: Diagnosis not present

## 2017-04-03 ENCOUNTER — Emergency Department: Payer: PPO

## 2017-04-03 ENCOUNTER — Encounter: Payer: Self-pay | Admitting: *Deleted

## 2017-04-03 ENCOUNTER — Emergency Department
Admission: EM | Admit: 2017-04-03 | Discharge: 2017-04-03 | Disposition: A | Discharge status: Home / Self Care | Payer: PPO | Attending: Emergency Medicine | Admitting: Emergency Medicine

## 2017-04-03 DIAGNOSIS — I10 Essential (primary) hypertension: Secondary | ICD-10-CM | POA: Insufficient documentation

## 2017-04-03 DIAGNOSIS — Z7982 Long term (current) use of aspirin: Secondary | ICD-10-CM | POA: Insufficient documentation

## 2017-04-03 DIAGNOSIS — Z79899 Other long term (current) drug therapy: Secondary | ICD-10-CM | POA: Diagnosis not present

## 2017-04-03 DIAGNOSIS — I2510 Atherosclerotic heart disease of native coronary artery without angina pectoris: Secondary | ICD-10-CM | POA: Insufficient documentation

## 2017-04-03 DIAGNOSIS — H538 Other visual disturbances: Secondary | ICD-10-CM | POA: Diagnosis not present

## 2017-04-03 DIAGNOSIS — R42 Dizziness and giddiness: Secondary | ICD-10-CM

## 2017-04-03 DIAGNOSIS — M7989 Other specified soft tissue disorders: Secondary | ICD-10-CM

## 2017-04-03 DIAGNOSIS — R079 Chest pain, unspecified: Secondary | ICD-10-CM | POA: Diagnosis not present

## 2017-04-03 LAB — BASIC METABOLIC PANEL
Anion gap: 5 (ref 5–15)
BUN: 17 mg/dL (ref 6–20)
CO2: 27 mmol/L (ref 22–32)
Calcium: 9 mg/dL (ref 8.9–10.3)
Chloride: 109 mmol/L (ref 101–111)
Creatinine, Ser: 1.04 mg/dL — ABNORMAL HIGH (ref 0.44–1.00)
GFR calc Af Amer: 53 mL/min — ABNORMAL LOW (ref >60)
GFR calc non Af Amer: 46 mL/min — ABNORMAL LOW (ref >60)
Glucose, Bld: 90 mg/dL (ref 65–99)
Potassium: 4 mmol/L (ref 3.5–5.1)
Sodium: 141 mmol/L (ref 135–145)

## 2017-04-03 LAB — TROPONIN I: Troponin I: <0.03 ng/mL (ref <0.03)

## 2017-04-03 LAB — CBC
HCT: 43.3 % (ref 35.0–47.0)
Hemoglobin: 14.9 g/dL (ref 12.0–16.0)
MCH: 34.1 pg — ABNORMAL HIGH (ref 26.0–34.0)
MCHC: 34.3 g/dL (ref 32.0–36.0)
MCV: 99.2 fL (ref 80.0–100.0)
Platelets: 189 10*3/uL (ref 150–440)
RBC: 4.36 MIL/uL (ref 3.80–5.20)
RDW: 14 % (ref 11.5–14.5)
WBC: 8.2 10*3/uL (ref 3.6–11.0)

## 2017-04-03 LAB — BRAIN NATRIURETIC PEPTIDE: B Natriuretic Peptide: 137 pg/mL — ABNORMAL HIGH (ref 0.0–100.0)

## 2017-04-03 NOTE — ED Triage Notes (Signed)
States blurred vision and dizziness for 1 week, states today she noticed burning and swelling in her feet today, awake and alert, denies any use of blood thinners or any falls, face symmetrical, grips equal, no neuro deficits noticed

## 2017-04-03 NOTE — ED Provider Notes (Signed)
[facility] Emergency Department Provider Note  ____________________________________________  Time seen: Approximately 6:48 PM  I have reviewed the triage vital signs and the nursing notes.   HISTORY  Chief Complaint Dizziness and Blurred Vision   HPI Sanjuana Mruk is a 83 y.o. female with history of coronary artery disease, hypertension, OSA, peripheral vascular disease, sarcoidosis who presents for evaluation of blurry vision and dizziness. Patient reports for the last 3 weeks she has had episodes of intermittent blurry vision. She was seen by her ophthalmologist last week and she was told that her prescription glasses were up to date and no acute findings were seen. Patient has also been having episodes of lightheadedness. She has experience the sensation of feeling like she is going to faint and also room spinning intermittently over the course of the last 3 weeks. Today she went to the bank when she started having blurry vision which has persisted throughout the day.She also noticed that both her soles were burning so she went to [facility] and they sent her here for evaluation. Patient denies slurred speech, dysphasia, dysarthria, difficulty finding words, facial droop, unilateral weakness or numbness. No prior history of stroke. No fall, no blood thinners. Patient is being evaluated by her primary care doctor and cardiology for bilateral lower extremity swelling which she said for a few months. She denies chest pain or shortness of breath, HA, dizziness, abdominal pain, palpitations.  Past Medical History:  Diagnosis Date  . Allergic rhinitis due to pollen   . Aortic atherosclerosis (HCC)   . Coronary artery disease    Stent in 2000's ([place])  . GERD (gastroesophageal reflux disease)   . Hard of hearing   . Hypertension   . Neuropathy (HCC)   . Obstructive sleep apnea   . Osteoarthritis of both knees   . Peripheral vascular disease (HCC)   . Sarcoidosis  (HCC)   . Thyroid nodule     Patient Active Problem List   Diagnosis Date Noted  . Pulmonary hypertension associated with sarcoidosis (HCC) 02/03/2017  . Dyspnea on exertion 11/28/2016  . Lower extremity edema 11/28/2016  . Breast pain, left 10/15/2016  . Thigh pain 10/15/2016  . Familial tremor 09/24/2016  . Sarcoidosis (HCC)   . Hypertension   . Allergic rhinitis due to pollen   . Coronary artery disease   . Aortic atherosclerosis (HCC)   . GERD (gastroesophageal reflux disease)   . Peripheral vascular disease (HCC)   . Osteoarthritis of both knees   . Obstructive sleep apnea   . Neuropathy [facility])     Past Surgical History:  Procedure Laterality Date  . BIOPSY THYROID  2014   Dr Andee Poles  . BREAST BIOPSY Left    surgical bx neg  . CARDIOVASCULAR STRESS TEST  05/12/2012   with dobutamine---negative. Normal LV function  . CORONARY ANGIOPLASTY  2006  . JOINT REPLACEMENT Left 2000   hip  . JOINT REPLACEMENT Right 2007   hip    Prior to Admission medications   Medication Sig Start Date End Date Taking? Authorizing Provider  acetaminophen (TYLENOL) 650 MG CR tablet Take 650 mg by mouth 3 (three) times daily.    Historical Provider, MD  acetaminophen-codeine (TYLENOL #3) 300-30 MG tablet Take 1 tablet by mouth 3 (three) times daily as needed for moderate pain. 02/03/17   Karie Schwalbe, MD  albuterol (PROVENTIL HFA) 108 (90 Base) MCG/ACT inhaler Inhale 2 puffs into the lungs every 4 (four) hours as needed for wheezing  or shortness of breath. 09/14/16   Sharman Cheek, MD  amLODipine (NORVASC) 5 MG tablet Take 1 tablet (5 mg total) by mouth daily. 12/24/16   Judy Pimple, MD  aspirin 81 MG tablet Take 81 mg by mouth daily.    Historical Provider, MD  LORazepam (ATIVAN) 0.5 MG tablet Take 1 tablet (0.5 mg total) by mouth at bedtime as needed for anxiety. 02/03/17   Karie Schwalbe, MD  metoprolol succinate (TOPROL-XL) 50 MG 24 hr tablet Take 50 mg by mouth 2 (two) times daily.  Take with or immediately following a meal.     Historical Provider, MD  pantoprazole (PROTONIX) 40 MG tablet Take 40 mg by mouth daily.    Historical Provider, MD  ranitidine (ZANTAC) 150 MG tablet Take 150 mg by mouth 2 (two) times daily as needed for heartburn.     Historical Provider, MD    Allergies Tramadol; Sulfa antibiotics; and Zocor [simvastatin]  History reviewed. No pertinent family history.  Social History Social History  Substance Use Topics  . Smoking status: Never Smoker  . Smokeless tobacco: Never Used  . Alcohol use No    Review of Systems  Constitutional: Negative for fever. + lightheadedness Eyes: Negative for visual changes. ENT: Negative for sore throat. Neck: No neck pain  Cardiovascular: Negative for chest pain. Respiratory: Negative for shortness of breath. Gastrointestinal: Negative for abdominal pain, vomiting or diarrhea. Genitourinary: Negative for dysuria. Musculoskeletal: Negative for back pain. Skin: Negative for rash. Neurological: Negative for headaches, weakness or numbness. + blurry vision and vertigo Psych: No SI or HI  ____________________________________________   PHYSICAL EXAM:  VITAL SIGNS: ED Triage Vitals  Enc Vitals Group     BP 04/03/17 1617 (!) 155/72     Pulse Rate 04/03/17 1617 77     Resp 04/03/17 1617 18     Temp 04/03/17 1617 97.6 F (36.4 C)     Temp Source 04/03/17 1617 Oral     SpO2 04/03/17 1617 97 %     Weight 04/03/17 1614 220 lb (99.8 kg)     Height 04/03/17 1614  (1.626 m)     Head Circumference --      Peak Flow --      Pain Score --      Pain Loc --      Pain Edu? --      Excl. in GC? --     Constitutional: Alert and oriented. Well appearing and in no apparent distress. HEENT:      Head: Normocephalic and atraumatic.         Eyes: Conjunctivae are normal. Sclera is non-icteric. EOMI. PERRL. Intact visual fields.      Mouth/Throat: Mucous membranes are moist.       Neck: Supple with no signs  of meningismus. Cardiovascular: Regular rate and rhythm. No murmurs, gallops, or rubs. 2+ symmetrical distal pulses are present in all extremities. No JVD. Respiratory: Normal respiratory effort. Lungs are clear to auscultation bilaterally. No wheezes, crackles, or rhonchi.  Gastrointestinal: Soft, non tender, and non distended with positive bowel sounds. No rebound or guarding. Musculoskeletal: 1+ pitting edema of b/l LE Neurologic: Normal speech and language. A & O x3, PERRL, no nystagmus, CN II-XII intact, motor testing reveals good tone and bulk throughout. There is no evidence of pronator drift or dysmetria. Muscle strength is 5/5 throughout. Deep tendon reflexes are 2+ throughout with downgoing toes. Sensory examination is intact. Gait is normal. Skin: Skin is warm, dry  and intact. No rash noted. Psychiatric: Mood and affect are normal. Speech and behavior are normal.  ____________________________________________   LABS (all labs ordered are listed, but only abnormal results are displayed)  Labs Reviewed  BASIC METABOLIC PANEL - Abnormal; Notable for the following:       Result Value   Creatinine, Ser 1.04 (*)    GFR calc non Af Amer 46 (*)    GFR calc Af Amer 53 (*)    All other components within normal limits  CBC - Abnormal; Notable for the following:    MCH 34.1 (*)    All other components within normal limits  BRAIN NATRIURETIC PEPTIDE - Abnormal; Notable for the following:    B Natriuretic Peptide 137.0 (*)    All other components wNita Sickleimits  TROPONIN I   ____________________________________________  EKG  ED ECG REPORT I, [place] Tyrrell Stephens, the attending physician, personally viewed and interpreted this ECG.  Sinus rhythm, rate of 72, normal intervals, normal axis, no ST elevations or depressions, T-wave inversions in leads 3, V2 and V3. No significant changes when compared to prior from 2017 ____________________________________________  RADIOLOGY  Head  CT; Negative  MRI brain: Mild chronic microvascular ischemia without acute intracranial abnormality. ____________________________________________   PROCEDURES  Procedure(s) performed: None Procedures Critical Care performed:  None ____________________________________________   INITIAL IMPRESSION / ASSESSMENT AND PLAN / ED COURSE  83 y.o. female with history of coronary artery disease, hypertension, OSA, peripheral vascular disease, sarcoidosis who presents for evaluation of intermittent episodes of blurry vision and lightheadedness/ vertigo. Patient is well-appearing at this time, neurologically intact. Her head CT shows no acute stroke. I'm concerned the patient could be having posterior fossa TIA/strokes. Since her symptoms have been persistent since earlier today and present at this time will pursue an MRI to rule out an acute stroke. Will give ASA. She is also complaining of burning and the soles of her both of her feet. Her feet are warm and well perfused with strong distal pulses. She has a history of peripheral vascular disease and neuropathy.  Clinical Course as of Apr 03 2014  Thu Apr 03, 2017  1931 BNP slightly elevated. Patient has been off of lasix per PCP due to concerns to worsening kidney function. Creatinine is at baseline at this time. CXR with no evidence of cardiomegaly or edema. Will recommend that patient restart lasix for her leg edema and f/u with Cardiology for reeval. Brain MRI pending.  [CV]  2010 MRI with no evidence of acute stroke in the setting of active blurry vision. Will dc home. Recommended starting lower dose of lasix  daily and f/u with PCP and cards. Patient has appointment with PCP next week.  [CV]    Clinical Course User Index [CV] Nita Sickle, MD    Pertinent labs & imaging results that were available during my care of the patient were reviewed by me and considered in my medical decision making (see chart for  details).    ____________________________________________   FINAL CLINICAL IMPRESSION(S) / ED DIAGNOSES  Final diagnoses:  Dizzy  Dizziness  Blurry vision, bilateral  Leg swelling      NEW MEDICATIONS STARTED DURING THIS VISIT:  New Prescriptions   No medications on file     Note:  This document was prepared using Dragon voice recognition software and may include unintentional dictation errors.    Nita Sickle, MD 04/03/17 2015

## 2017-04-03 NOTE — ED Notes (Signed)
Pt alert.  Speech clear.  Pt watching tv.

## 2017-04-03 NOTE — ED Notes (Addendum)
Pt reports dizziness and blurred vision for 2 weeks.  Pt saw dr Sharman Crate, eye doctor, and states everything was ok.  Pt reports a dull headache for 3-4 days.  No n/v.  Pt alert.  Speech clear.  Ambulates with a cane without diff.  Pt lives alone and drove self to hospital.  Pt has swelling of feet for 1 month.  Pt saw dr Darrold Junker and now not taking fluid pill.   Pt has intermittent sob.  No cough or fever.

## 2017-04-03 NOTE — Discharge Instructions (Signed)
Leg swelling: Re-start lasix 20 mg daily. Follow up with your cardiologist for further management.  Follow-up with her primary care doctor for further evaluation of the dizzy spells.

## 2017-04-03 NOTE — ED Notes (Signed)
Patient transported to MRI 

## 2017-04-03 NOTE — ED Notes (Signed)
Pt signed esignature.  D/c  inst to pt.  

## 2017-04-03 NOTE — ED Notes (Signed)
Patient transported to X-ray 

## 2017-04-07 DIAGNOSIS — R600 Localized edema: Secondary | ICD-10-CM | POA: Diagnosis not present

## 2017-04-07 DIAGNOSIS — I10 Essential (primary) hypertension: Secondary | ICD-10-CM | POA: Diagnosis not present

## 2017-04-07 DIAGNOSIS — I2510 Atherosclerotic heart disease of native coronary artery without angina pectoris: Secondary | ICD-10-CM | POA: Diagnosis not present

## 2017-04-07 DIAGNOSIS — R42 Dizziness and giddiness: Secondary | ICD-10-CM | POA: Diagnosis not present

## 2017-04-08 ENCOUNTER — Telehealth: Payer: Self-pay

## 2017-04-08 NOTE — Telephone Encounter (Signed)
Spoke to pt. She went to cardiology yesterday. Having some other testing done.

## 2017-04-11 DIAGNOSIS — M25569 Pain in unspecified knee: Secondary | ICD-10-CM | POA: Diagnosis not present

## 2017-04-11 DIAGNOSIS — M179 Osteoarthritis of knee, unspecified: Secondary | ICD-10-CM | POA: Diagnosis not present

## 2017-04-11 DIAGNOSIS — M130 Polyarthritis, unspecified: Secondary | ICD-10-CM | POA: Diagnosis not present

## 2017-04-11 DIAGNOSIS — E8881 Metabolic syndrome: Secondary | ICD-10-CM | POA: Diagnosis not present

## 2017-04-14 DIAGNOSIS — H40003 Preglaucoma, unspecified, bilateral: Secondary | ICD-10-CM | POA: Diagnosis not present

## 2017-04-17 ENCOUNTER — Other Ambulatory Visit: Payer: Self-pay | Admitting: Family Medicine

## 2017-04-21 DIAGNOSIS — H401131 Primary open-angle glaucoma, bilateral, mild stage: Secondary | ICD-10-CM | POA: Diagnosis not present

## 2017-04-29 DIAGNOSIS — Z79899 Other long term (current) drug therapy: Secondary | ICD-10-CM | POA: Diagnosis not present

## 2017-04-29 DIAGNOSIS — M1711 Unilateral primary osteoarthritis, right knee: Secondary | ICD-10-CM | POA: Diagnosis not present

## 2017-04-29 DIAGNOSIS — I6523 Occlusion and stenosis of bilateral carotid arteries: Secondary | ICD-10-CM | POA: Diagnosis not present

## 2017-04-29 DIAGNOSIS — R42 Dizziness and giddiness: Secondary | ICD-10-CM | POA: Diagnosis not present

## 2017-05-06 DIAGNOSIS — I10 Essential (primary) hypertension: Secondary | ICD-10-CM | POA: Diagnosis not present

## 2017-05-06 DIAGNOSIS — I2729 Other secondary pulmonary hypertension: Secondary | ICD-10-CM | POA: Diagnosis not present

## 2017-05-06 DIAGNOSIS — D869 Sarcoidosis, unspecified: Secondary | ICD-10-CM | POA: Diagnosis not present

## 2017-05-06 DIAGNOSIS — I2510 Atherosclerotic heart disease of native coronary artery without angina pectoris: Secondary | ICD-10-CM | POA: Diagnosis not present

## 2017-05-06 DIAGNOSIS — R42 Dizziness and giddiness: Secondary | ICD-10-CM | POA: Diagnosis not present

## 2017-05-06 DIAGNOSIS — Z01810 Encounter for preprocedural cardiovascular examination: Secondary | ICD-10-CM | POA: Diagnosis not present

## 2017-05-06 DIAGNOSIS — E785 Hyperlipidemia, unspecified: Secondary | ICD-10-CM | POA: Diagnosis not present

## 2017-05-06 DIAGNOSIS — R0602 Shortness of breath: Secondary | ICD-10-CM | POA: Diagnosis not present

## 2017-05-12 DIAGNOSIS — I493 Ventricular premature depolarization: Secondary | ICD-10-CM | POA: Diagnosis not present

## 2017-05-13 DIAGNOSIS — I2510 Atherosclerotic heart disease of native coronary artery without angina pectoris: Secondary | ICD-10-CM | POA: Diagnosis not present

## 2017-05-22 DIAGNOSIS — H401131 Primary open-angle glaucoma, bilateral, mild stage: Secondary | ICD-10-CM | POA: Diagnosis not present

## 2017-06-05 ENCOUNTER — Ambulatory Visit: Payer: PPO | Admitting: Internal Medicine

## 2017-06-10 DIAGNOSIS — I10 Essential (primary) hypertension: Secondary | ICD-10-CM | POA: Diagnosis not present

## 2017-06-10 DIAGNOSIS — D869 Sarcoidosis, unspecified: Secondary | ICD-10-CM | POA: Diagnosis not present

## 2017-06-10 DIAGNOSIS — I739 Peripheral vascular disease, unspecified: Secondary | ICD-10-CM | POA: Diagnosis not present

## 2017-06-10 DIAGNOSIS — I2729 Other secondary pulmonary hypertension: Secondary | ICD-10-CM | POA: Diagnosis not present

## 2017-06-10 DIAGNOSIS — E785 Hyperlipidemia, unspecified: Secondary | ICD-10-CM | POA: Diagnosis not present

## 2017-06-10 DIAGNOSIS — R0609 Other forms of dyspnea: Secondary | ICD-10-CM | POA: Diagnosis not present

## 2017-06-10 DIAGNOSIS — I2510 Atherosclerotic heart disease of native coronary artery without angina pectoris: Secondary | ICD-10-CM | POA: Diagnosis not present

## 2017-06-11 ENCOUNTER — Encounter
Admission: RE | Admit: 2017-06-11 | Discharge: 2017-06-11 | Disposition: A | Discharge status: Home / Self Care | Payer: PPO | Source: Ambulatory Visit | Attending: Orthopedic Surgery | Admitting: Orthopedic Surgery

## 2017-06-11 DIAGNOSIS — Z01818 Encounter for other preprocedural examination: Secondary | ICD-10-CM | POA: Diagnosis not present

## 2017-06-11 HISTORY — DX: Dyspnea, unspecified: R06.00

## 2017-06-11 HISTORY — DX: Anxiety disorder, unspecified: F41.9

## 2017-06-11 LAB — PROTIME-INR
INR: 0.95
Prothrombin Time: 12.7 seconds (ref 11.4–15.2)

## 2017-06-11 LAB — COMPREHENSIVE METABOLIC PANEL
ALT: 10 U/L — ABNORMAL LOW (ref 14–54)
AST: 20 U/L (ref 15–41)
Albumin: 4.3 g/dL (ref 3.5–5.0)
Alkaline Phosphatase: 94 U/L (ref 38–126)
Anion gap: 7 (ref 5–15)
BUN: 12 mg/dL (ref 6–20)
CO2: 27 mmol/L (ref 22–32)
Calcium: 9 mg/dL (ref 8.9–10.3)
Chloride: 102 mmol/L (ref 101–111)
Creatinine, Ser: 1 mg/dL (ref 0.44–1.00)
GFR calc Af Amer: 55 mL/min — ABNORMAL LOW (ref >60)
GFR calc non Af Amer: 48 mL/min — ABNORMAL LOW (ref >60)
Glucose, Bld: 115 mg/dL — ABNORMAL HIGH (ref 65–99)
Potassium: 3.9 mmol/L (ref 3.5–5.1)
Sodium: 136 mmol/L (ref 135–145)
Total Bilirubin: 1.1 mg/dL (ref 0.3–1.2)
Total Protein: 7.5 g/dL (ref 6.5–8.1)

## 2017-06-11 LAB — URINALYSIS, ROUTINE W REFLEX MICROSCOPIC
Bilirubin Urine: NEGATIVE
Glucose, UA: NEGATIVE mg/dL
Hgb urine dipstick: NEGATIVE
Ketones, ur: NEGATIVE mg/dL
Leukocytes, UA: NEGATIVE
Nitrite: NEGATIVE
Protein, ur: NEGATIVE mg/dL
Specific Gravity, Urine: 1.014 (ref 1.005–1.030)
pH: 7 (ref 5.0–8.0)

## 2017-06-11 LAB — TYPE AND SCREEN
ABO/RH(D): O POS
Antibody Screen: NEGATIVE

## 2017-06-11 LAB — CBC
HCT: 42.4 % (ref 35.0–47.0)
Hemoglobin: 14.3 g/dL (ref 12.0–16.0)
MCH: 33.3 pg (ref 26.0–34.0)
MCHC: 33.6 g/dL (ref 32.0–36.0)
MCV: 99 fL (ref 80.0–100.0)
Platelets: 178 10*3/uL (ref 150–440)
RBC: 4.28 MIL/uL (ref 3.80–5.20)
RDW: 14.6 % — ABNORMAL HIGH (ref 11.5–14.5)
WBC: 7 10*3/uL (ref 3.6–11.0)

## 2017-06-11 LAB — SEDIMENTATION RATE: Sed Rate: 15 mm/hr (ref 0–30)

## 2017-06-11 LAB — C-REACTIVE PROTEIN: CRP: <0.8 mg/dL (ref <1.0)

## 2017-06-11 LAB — SURGICAL PCR SCREEN
MRSA, PCR: NEGATIVE
Staphylococcus aureus: NEGATIVE

## 2017-06-11 LAB — APTT: aPTT: 29 seconds (ref 24–36)

## 2017-06-11 NOTE — Pre-Procedure Instructions (Signed)
EKG  ED ECG REPORT I, Nita Sicklearolina Veronese, the attending physician, personally viewed and interpreted this ECG.  Sinus rhythm, rate of 72, normal intervals, normal axis, no ST elevations or depressions, T-wave inversions in leads 3, V2 and V3. No significant changes when compared to prior from 2017

## 2017-06-11 NOTE — Patient Instructions (Signed)
Your procedure is scheduled on: Monday 06/23/17 Report to DAY SURGERY. 2ND FLOOR MEDICAL MALL ENTRANCE. To find out your arrival time please call [phone] between 1PM - 3PM on Friday 06/20/17.  Remember: Instructions that are not followed completely may result in serious medical risk, up to and including death, or upon the discretion of your surgeon and anesthesiologist your surgery may need to be rescheduled.    __X__ 1. Do not eat food or drink liquids after midnight. No gum chewing or hard candies.     __X__ 2. No Alcohol for 24 hours before or after surgery.   ____ 3. Bring all medications with you on the day of surgery if instructed.    __X__ 4. Notify your doctor if there is any change in your medical condition     (cold, fever, infections).             ___X__5. No smoking within 24 hours of your surgery.     Do not wear jewelry, make-up, hairpins, clips or nail polish.  Do not wear lotions, powders, or perfumes.   Do not shave 48 hours prior to surgery. Men may shave face and neck.  Do not bring valuables to the hospital.    [facility] any belongings or valuables.               Contacts, dentures or bridgework may not be worn into surgery.  Leave your suitcase in the car. After surgery it may be brought to your room.  For patients admitted to the hospital, discharge time is determined by your                treatment team.   Patients discharged the day of surgery will not be allowed to drive home.   Please read over the following fact sheets that you were given:   Pain Booklet and MRSA Information   __X__ Take these medicines the morning of surgery with A SIP OF WATER:    1. AMLODIPINE  2. METOPROLOL  3. PANTOPRAZOLE  4.  5.  6.  ____ Fleet Enema (as directed)   __X__ Use CHG Soap as directed  __X__ Use inhalers on the day of surgery  ____ Stop metformin 2 days prior to surgery    ____ Take 1/2 of usual insulin dose the night before  surgery and none on the morning of surgery.   __X__ Stop Coumadin/Plavix/aspirin on 06/16/17  __X__ Stop Anti-inflammatories such as Advil, Aleve, Ibuprofen, Motrin, Naproxen, Naprosyn, Goodies,powder, or aspirin products.  OK to take Tylenol.   ____ Stop supplements until after surgery.    __X__ Bring C-Pap to the hospital.

## 2017-06-12 LAB — URINE CULTURE
Culture: <10000 — AB
Special Requests: NORMAL

## 2017-06-12 LAB — HEMOGLOBIN A1C
Hgb A1c MFr Bld: 6.1 % — ABNORMAL HIGH (ref 4.8–5.6)
Mean Plasma Glucose: 128 mg/dL

## 2017-06-22 MED ORDER — TRANEXAMIC ACID 1000 MG/10ML IV SOLN
1000.0000 mg | INTRAVENOUS | Status: DC
  Filled 2017-06-22: qty 10

## 2017-06-22 MED ORDER — CEFAZOLIN SODIUM-DEXTROSE 2-4 GM/100ML-% IV SOLN: 2.0000 g | INTRAVENOUS | Status: DC

## 2017-06-23 ENCOUNTER — Inpatient Hospital Stay
Admission: RE | Admit: 2017-06-23 | Discharge: 2017-06-25 | DRG: 470 | Disposition: A | Discharge status: Skilled Nursing Facility | Payer: PPO | Source: Ambulatory Visit | Attending: Orthopedic Surgery | Admitting: Orthopedic Surgery

## 2017-06-23 ENCOUNTER — Encounter: Payer: Self-pay | Admitting: Orthopedic Surgery

## 2017-06-23 ENCOUNTER — Inpatient Hospital Stay: Payer: PPO | Admitting: Anesthesiology

## 2017-06-23 ENCOUNTER — Encounter
Admission: RE | Disposition: A | Discharge status: Skilled Nursing Facility | Payer: Self-pay | Source: Ambulatory Visit | Attending: Orthopedic Surgery

## 2017-06-23 ENCOUNTER — Inpatient Hospital Stay: Payer: PPO

## 2017-06-23 DIAGNOSIS — I2510 Atherosclerotic heart disease of native coronary artery without angina pectoris: Secondary | ICD-10-CM | POA: Diagnosis present

## 2017-06-23 DIAGNOSIS — Z96643 Presence of artificial hip joint, bilateral: Secondary | ICD-10-CM | POA: Diagnosis not present

## 2017-06-23 DIAGNOSIS — F419 Anxiety disorder, unspecified: Secondary | ICD-10-CM | POA: Diagnosis not present

## 2017-06-23 DIAGNOSIS — I700 Atherosclerosis of aorta: Secondary | ICD-10-CM | POA: Diagnosis not present

## 2017-06-23 DIAGNOSIS — I2729 Other secondary pulmonary hypertension: Secondary | ICD-10-CM | POA: Diagnosis not present

## 2017-06-23 DIAGNOSIS — Z7982 Long term (current) use of aspirin: Secondary | ICD-10-CM | POA: Diagnosis not present

## 2017-06-23 DIAGNOSIS — M25561 Pain in right knee: Secondary | ICD-10-CM | POA: Diagnosis not present

## 2017-06-23 DIAGNOSIS — Z833 Family history of diabetes mellitus: Secondary | ICD-10-CM | POA: Diagnosis not present

## 2017-06-23 DIAGNOSIS — Z96659 Presence of unspecified artificial knee joint: Secondary | ICD-10-CM

## 2017-06-23 DIAGNOSIS — R262 Difficulty in walking, not elsewhere classified: Secondary | ICD-10-CM | POA: Diagnosis not present

## 2017-06-23 DIAGNOSIS — M170 Bilateral primary osteoarthritis of knee: Secondary | ICD-10-CM | POA: Diagnosis not present

## 2017-06-23 DIAGNOSIS — Z791 Long term (current) use of non-steroidal anti-inflammatories (NSAID): Secondary | ICD-10-CM

## 2017-06-23 DIAGNOSIS — G4730 Sleep apnea, unspecified: Secondary | ICD-10-CM | POA: Diagnosis not present

## 2017-06-23 DIAGNOSIS — I739 Peripheral vascular disease, unspecified: Secondary | ICD-10-CM | POA: Diagnosis not present

## 2017-06-23 DIAGNOSIS — Z471 Aftercare following joint replacement surgery: Secondary | ICD-10-CM | POA: Diagnosis not present

## 2017-06-23 DIAGNOSIS — M6281 Muscle weakness (generalized): Secondary | ICD-10-CM | POA: Diagnosis not present

## 2017-06-23 DIAGNOSIS — Z8249 Family history of ischemic heart disease and other diseases of the circulatory system: Secondary | ICD-10-CM | POA: Diagnosis not present

## 2017-06-23 DIAGNOSIS — G4733 Obstructive sleep apnea (adult) (pediatric): Secondary | ICD-10-CM | POA: Diagnosis not present

## 2017-06-23 DIAGNOSIS — H409 Unspecified glaucoma: Secondary | ICD-10-CM | POA: Diagnosis not present

## 2017-06-23 DIAGNOSIS — Z955 Presence of coronary angioplasty implant and graft: Secondary | ICD-10-CM

## 2017-06-23 DIAGNOSIS — Z7401 Bed confinement status: Secondary | ICD-10-CM | POA: Diagnosis not present

## 2017-06-23 DIAGNOSIS — Z79899 Other long term (current) drug therapy: Secondary | ICD-10-CM

## 2017-06-23 DIAGNOSIS — Z96651 Presence of right artificial knee joint: Secondary | ICD-10-CM | POA: Diagnosis not present

## 2017-06-23 DIAGNOSIS — D869 Sarcoidosis, unspecified: Secondary | ICD-10-CM | POA: Diagnosis not present

## 2017-06-23 DIAGNOSIS — K219 Gastro-esophageal reflux disease without esophagitis: Secondary | ICD-10-CM | POA: Diagnosis not present

## 2017-06-23 DIAGNOSIS — I10 Essential (primary) hypertension: Secondary | ICD-10-CM | POA: Diagnosis not present

## 2017-06-23 DIAGNOSIS — M1711 Unilateral primary osteoarthritis, right knee: Secondary | ICD-10-CM | POA: Diagnosis not present

## 2017-06-23 HISTORY — PX: KNEE ARTHROPLASTY: SHX992

## 2017-06-23 LAB — ABO/RH: ABO/RH(D): O POS

## 2017-06-23 LAB — GLUCOSE, CAPILLARY
Glucose-Capillary: 127 mg/dL — ABNORMAL HIGH (ref 65–99)
Glucose-Capillary: 102 mg/dL — ABNORMAL HIGH (ref 65–99)

## 2017-06-23 SURGERY — ARTHROPLASTY, KNEE, TOTAL, USING IMAGELESS COMPUTER-ASSISTED NAVIGATION
Anesthesia: Spinal | Site: Knee | Laterality: Right | Wound class: Clean

## 2017-06-23 MED ORDER — OXYCODONE HCL 5 MG/5ML PO SOLN: 5.0000 mg | Freq: Once | ORAL | Status: DC | PRN

## 2017-06-23 MED ORDER — FUROSEMIDE 20 MG PO TABS: 20.0000 mg | ORAL_TABLET | ORAL | Status: DC

## 2017-06-23 MED ORDER — LORAZEPAM 0.5 MG PO TABS
0.5000 mg | ORAL_TABLET | Freq: Every evening | ORAL | Status: DC | PRN
  Administered 2017-06-23: 0.5 mg via ORAL
  Filled 2017-06-23: qty 1

## 2017-06-23 MED ORDER — CHLORHEXIDINE GLUCONATE 4 % EX LIQD: 60.0000 mL | Freq: Once | CUTANEOUS | Status: DC

## 2017-06-23 MED ORDER — FENTANYL CITRATE (PF) 100 MCG/2ML IJ SOLN
25.0000 ug | INTRAMUSCULAR | Status: DC | PRN
  Administered 2017-06-23: 25 ug via INTRAVENOUS

## 2017-06-23 MED ORDER — PHENOL 1.4 % MT LIQD
1.0000 | OROMUCOSAL | Status: DC | PRN
  Filled 2017-06-23: qty 177

## 2017-06-23 MED ORDER — PROPOFOL 500 MG/50ML IV EMUL
INTRAVENOUS | Status: DC | PRN
  Administered 2017-06-23: 45 ug/kg/min via INTRAVENOUS

## 2017-06-23 MED ORDER — PROPOFOL 10 MG/ML IV BOLUS
INTRAVENOUS | Status: AC
  Filled 2017-06-23: qty 20

## 2017-06-23 MED ORDER — SERTRALINE HCL 50 MG PO TABS: 25.0000 mg | ORAL_TABLET | Freq: Every day | ORAL | Status: DC | PRN

## 2017-06-23 MED ORDER — OXYCODONE HCL 5 MG PO TABS: 5.0000 mg | ORAL_TABLET | Freq: Once | ORAL | Status: DC | PRN

## 2017-06-23 MED ORDER — METOCLOPRAMIDE HCL 10 MG PO TABS
10.0000 mg | ORAL_TABLET | Freq: Three times a day (TID) | ORAL | Status: AC
  Administered 2017-06-23 – 2017-06-25 (×8): 10 mg via ORAL
  Filled 2017-06-23 (×7): qty 1

## 2017-06-23 MED ORDER — BISACODYL 10 MG RE SUPP
10.0000 mg | Freq: Every day | RECTAL | Status: DC | PRN
  Administered 2017-06-25: 10 mg via RECTAL
  Filled 2017-06-23: qty 1

## 2017-06-23 MED ORDER — BUPIVACAINE LIPOSOME 1.3 % IJ SUSP
INTRAMUSCULAR | Status: DC | PRN
  Administered 2017-06-23: 60 mL

## 2017-06-23 MED ORDER — ENOXAPARIN SODIUM 30 MG/0.3ML ~~LOC~~ SOLN
30.0000 mg | Freq: Two times a day (BID) | SUBCUTANEOUS | Status: DC
  Administered 2017-06-24 – 2017-06-25 (×3): 30 mg via SUBCUTANEOUS
  Filled 2017-06-23 (×3): qty 0.3

## 2017-06-23 MED ORDER — SODIUM CHLORIDE 0.9 % IV SOLN
INTRAVENOUS | Status: DC | PRN
  Administered 2017-06-23: 1000 mg via INTRAVENOUS

## 2017-06-23 MED ORDER — ACETAMINOPHEN 325 MG PO TABS
650.0000 mg | ORAL_TABLET | Freq: Four times a day (QID) | ORAL | Status: DC | PRN
  Administered 2017-06-24: 650 mg via ORAL
  Filled 2017-06-23: qty 2

## 2017-06-23 MED ORDER — LATANOPROST 0.005 % OP SOLN
1.0000 [drp] | Freq: Every day | OPHTHALMIC | Status: DC
  Administered 2017-06-23 – 2017-06-24 (×2): 1 [drp] via OPHTHALMIC
  Filled 2017-06-23: qty 2.5

## 2017-06-23 MED ORDER — NEOMYCIN-POLYMYXIN B GU 40-200000 IR SOLN
Status: DC | PRN
  Administered 2017-06-23: 14 mL

## 2017-06-23 MED ORDER — TRANEXAMIC ACID 1000 MG/10ML IV SOLN
1000.0000 mg | Freq: Once | INTRAVENOUS | Status: AC
  Administered 2017-06-23: 1000 mg via INTRAVENOUS
  Filled 2017-06-23: qty 10

## 2017-06-23 MED ORDER — MORPHINE SULFATE (PF) 2 MG/ML IV SOLN: 2.0000 mg | INTRAVENOUS | Status: DC | PRN

## 2017-06-23 MED ORDER — SODIUM CHLORIDE 0.9 % IJ SOLN
INTRAMUSCULAR | Status: AC
  Filled 2017-06-23: qty 50

## 2017-06-23 MED ORDER — BUPIVACAINE HCL (PF) 0.5 % IJ SOLN
INTRAMUSCULAR | Status: DC | PRN
  Administered 2017-06-23: 2 mL

## 2017-06-23 MED ORDER — BUPIVACAINE HCL (PF) 0.25 % IJ SOLN
INTRAMUSCULAR | Status: DC | PRN
  Administered 2017-06-23: 60 mL

## 2017-06-23 MED ORDER — FERROUS SULFATE 325 (65 FE) MG PO TABS
325.0000 mg | ORAL_TABLET | Freq: Two times a day (BID) | ORAL | Status: DC
  Administered 2017-06-23 – 2017-06-25 (×5): 325 mg via ORAL
  Filled 2017-06-23 (×4): qty 1

## 2017-06-23 MED ORDER — AMLODIPINE BESYLATE 5 MG PO TABS
5.0000 mg | ORAL_TABLET | Freq: Every day | ORAL | Status: DC
  Administered 2017-06-24 – 2017-06-25 (×2): 5 mg via ORAL
  Filled 2017-06-23 (×2): qty 1

## 2017-06-23 MED ORDER — NEOMYCIN-POLYMYXIN B GU 40-200000 IR SOLN
Status: AC
  Filled 2017-06-23: qty 20

## 2017-06-23 MED ORDER — MAGNESIUM HYDROXIDE 400 MG/5ML PO SUSP
30.0000 mL | Freq: Every day | ORAL | Status: DC | PRN
  Administered 2017-06-24: 30 mL via ORAL
  Filled 2017-06-23: qty 30

## 2017-06-23 MED ORDER — FENTANYL CITRATE (PF) 100 MCG/2ML IJ SOLN
INTRAMUSCULAR | Status: AC
  Administered 2017-06-23: 25 ug via INTRAVENOUS
  Filled 2017-06-23: qty 2

## 2017-06-23 MED ORDER — LIDOCAINE HCL (CARDIAC) 20 MG/ML IV SOLN
INTRAVENOUS | Status: DC | PRN
  Administered 2017-06-23: 50 mg via INTRAVENOUS

## 2017-06-23 MED ORDER — TETRACAINE HCL 1 % IJ SOLN
INTRAMUSCULAR | Status: AC
  Filled 2017-06-23: qty 2

## 2017-06-23 MED ORDER — PANTOPRAZOLE SODIUM 40 MG PO TBEC
40.0000 mg | DELAYED_RELEASE_TABLET | Freq: Two times a day (BID) | ORAL | Status: DC
  Administered 2017-06-23 – 2017-06-25 (×4): 40 mg via ORAL
  Filled 2017-06-23 (×4): qty 1

## 2017-06-23 MED ORDER — SENNOSIDES-DOCUSATE SODIUM 8.6-50 MG PO TABS
1.0000 | ORAL_TABLET | Freq: Two times a day (BID) | ORAL | Status: DC
  Administered 2017-06-23 – 2017-06-25 (×4): 1 via ORAL
  Filled 2017-06-23 (×4): qty 1

## 2017-06-23 MED ORDER — TETRACAINE HCL 1 % IJ SOLN
INTRAMUSCULAR | Status: DC | PRN
  Administered 2017-06-23: 5 mg via INTRASPINAL

## 2017-06-23 MED ORDER — CEFAZOLIN SODIUM-DEXTROSE 2-4 GM/100ML-% IV SOLN
2.0000 g | Freq: Four times a day (QID) | INTRAVENOUS | Status: AC
  Administered 2017-06-23 – 2017-06-24 (×3): 2 g via INTRAVENOUS
  Filled 2017-06-23 (×4): qty 100

## 2017-06-23 MED ORDER — BUPIVACAINE LIPOSOME 1.3 % IJ SUSP
INTRAMUSCULAR | Status: AC
  Filled 2017-06-23: qty 20

## 2017-06-23 MED ORDER — ACETAMINOPHEN 10 MG/ML IV SOLN
1000.0000 mg | Freq: Four times a day (QID) | INTRAVENOUS | Status: AC
  Administered 2017-06-23 – 2017-06-24 (×4): 1000 mg via INTRAVENOUS
  Filled 2017-06-23 (×4): qty 100

## 2017-06-23 MED ORDER — ACETAMINOPHEN 650 MG RE SUPP: 650.0000 mg | Freq: Four times a day (QID) | RECTAL | Status: DC | PRN

## 2017-06-23 MED ORDER — LIDOCAINE HCL (PF) 2 % IJ SOLN
INTRAMUSCULAR | Status: AC
  Filled 2017-06-23: qty 2

## 2017-06-23 MED ORDER — FENTANYL CITRATE (PF) 100 MCG/2ML IJ SOLN
INTRAMUSCULAR | Status: AC
  Filled 2017-06-23: qty 2

## 2017-06-23 MED ORDER — ALBUTEROL SULFATE HFA 108 (90 BASE) MCG/ACT IN AERS: 2.0000 | INHALATION_SPRAY | RESPIRATORY_TRACT | Status: DC | PRN

## 2017-06-23 MED ORDER — SODIUM CHLORIDE 0.9 % IV SOLN
INTRAVENOUS | Status: DC
  Administered 2017-06-23: 16:00:00 via INTRAVENOUS
  Administered 2017-06-23: 100 mL/h via INTRAVENOUS

## 2017-06-23 MED ORDER — FLEET ENEMA 7-19 GM/118ML RE ENEM
1.0000 | ENEMA | Freq: Once | RECTAL | Status: AC | PRN
  Administered 2017-06-25: 1 via RECTAL

## 2017-06-23 MED ORDER — SODIUM CHLORIDE 0.9 % IV SOLN
INTRAVENOUS | Status: DC
  Administered 2017-06-23 (×2): via INTRAVENOUS

## 2017-06-23 MED ORDER — ACETAMINOPHEN 10 MG/ML IV SOLN
INTRAVENOUS | Status: AC
  Filled 2017-06-23: qty 100

## 2017-06-23 MED ORDER — ONDANSETRON HCL 4 MG/2ML IJ SOLN: 4.0000 mg | Freq: Four times a day (QID) | INTRAMUSCULAR | Status: DC | PRN

## 2017-06-23 MED ORDER — DIPHENHYDRAMINE HCL 12.5 MG/5ML PO ELIX: 12.5000 mg | ORAL_SOLUTION | ORAL | Status: DC | PRN

## 2017-06-23 MED ORDER — CEFAZOLIN SODIUM-DEXTROSE 2-3 GM-% IV SOLR
INTRAVENOUS | Status: DC | PRN
  Administered 2017-06-23: 2 g via INTRAVENOUS

## 2017-06-23 MED ORDER — FENTANYL CITRATE (PF) 100 MCG/2ML IJ SOLN
INTRAMUSCULAR | Status: DC | PRN
  Administered 2017-06-23: 50 ug via INTRAVENOUS

## 2017-06-23 MED ORDER — METOPROLOL SUCCINATE ER 50 MG PO TB24
100.0000 mg | ORAL_TABLET | Freq: Every day | ORAL | Status: DC
  Administered 2017-06-24 – 2017-06-25 (×2): 100 mg via ORAL
  Filled 2017-06-23 (×2): qty 2

## 2017-06-23 MED ORDER — ALUM & MAG HYDROXIDE-SIMETH 200-200-20 MG/5ML PO SUSP: 30.0000 mL | ORAL | Status: DC | PRN

## 2017-06-23 MED ORDER — ALBUTEROL SULFATE (2.5 MG/3ML) 0.083% IN NEBU: 2.5000 mg | INHALATION_SOLUTION | RESPIRATORY_TRACT | Status: DC | PRN

## 2017-06-23 MED ORDER — OXYCODONE HCL 5 MG PO TABS
5.0000 mg | ORAL_TABLET | ORAL | Status: DC | PRN
  Administered 2017-06-23: 5 mg via ORAL
  Filled 2017-06-23: qty 1

## 2017-06-23 MED ORDER — SODIUM CHLORIDE 0.9 % IV SOLN
INTRAVENOUS | Status: DC | PRN
  Administered 2017-06-23: 15 ug/min via INTRAVENOUS

## 2017-06-23 MED ORDER — PROPOFOL 500 MG/50ML IV EMUL
INTRAVENOUS | Status: AC
  Filled 2017-06-23: qty 50

## 2017-06-23 MED ORDER — BUPIVACAINE HCL (PF) 0.25 % IJ SOLN
INTRAMUSCULAR | Status: AC
  Filled 2017-06-23: qty 30

## 2017-06-23 MED ORDER — ONDANSETRON HCL 4 MG PO TABS: 4.0000 mg | ORAL_TABLET | Freq: Four times a day (QID) | ORAL | Status: DC | PRN

## 2017-06-23 MED ORDER — MENTHOL 3 MG MT LOZG
1.0000 | LOZENGE | OROMUCOSAL | Status: DC | PRN
  Filled 2017-06-23: qty 9

## 2017-06-23 MED ORDER — CEFAZOLIN SODIUM-DEXTROSE 2-4 GM/100ML-% IV SOLN
INTRAVENOUS | Status: AC
  Filled 2017-06-23: qty 100

## 2017-06-23 MED ORDER — ACETAMINOPHEN 10 MG/ML IV SOLN
INTRAVENOUS | Status: DC | PRN
  Administered 2017-06-23: 1000 mg via INTRAVENOUS

## 2017-06-23 SURGICAL SUPPLY — 68 items
BATTERY INSTRU NAVIGATION (MISCELLANEOUS) ×8 IMPLANT
BLADE SAW 1 (BLADE) ×2 IMPLANT
BLADE SAW 1/2 (BLADE) ×2 IMPLANT
BLADE SAW 70X12.5 (BLADE) IMPLANT
BONE CEMENT GENTAMICIN (Cement) ×4 IMPLANT
CANISTER SUCT 1200ML W/VALVE (MISCELLANEOUS) ×2 IMPLANT
CANISTER SUCT 3000ML PPV (MISCELLANEOUS) ×4 IMPLANT
CAPT KNEE TOTAL 3 ATTUNE ×2 IMPLANT
CATH TRAY METER 16FR LF (MISCELLANEOUS) ×2 IMPLANT
CEMENT BONE GENTAMICIN 40 (Cement) ×2 IMPLANT
COOLER POLAR GLACIER W/PUMP (MISCELLANEOUS) ×2 IMPLANT
CUFF TOURN 24 STER (MISCELLANEOUS) IMPLANT
CUFF TOURN 30 STER DUAL PORT (MISCELLANEOUS) ×2 IMPLANT
DRAPE SHEET LG 3/4 BI-LAMINATE (DRAPES) ×2 IMPLANT
DRSG DERMACEA 8X12 NADH (GAUZE/BANDAGES/DRESSINGS) ×2 IMPLANT
DRSG OPSITE POSTOP 4X14 (GAUZE/BANDAGES/DRESSINGS) ×2 IMPLANT
DRSG TEGADERM 4X4.75 (GAUZE/BANDAGES/DRESSINGS) ×2 IMPLANT
DURAPREP 26ML APPLICATOR (WOUND CARE) ×4 IMPLANT
ELECT CAUTERY BLADE 6.4 (BLADE) ×2 IMPLANT
ELECT REM PT RETURN 9FT ADLT (ELECTROSURGICAL) ×2
ELECTRODE REM PT RTRN 9FT ADLT (ELECTROSURGICAL) ×1 IMPLANT
EX-PIN ORTHOLOCK NAV 4X150 (PIN) ×4 IMPLANT
GLOVE BIO SURGEON STRL SZ7.5 (GLOVE) ×2 IMPLANT
GLOVE BIOGEL M STRL SZ7.5 (GLOVE) ×6 IMPLANT
GLOVE BIOGEL PI IND STRL 7.0 (GLOVE) ×2 IMPLANT
GLOVE BIOGEL PI IND STRL 7.5 (GLOVE) ×1 IMPLANT
GLOVE BIOGEL PI IND STRL 9 (GLOVE) ×1 IMPLANT
GLOVE BIOGEL PI INDICATOR 7.0 (GLOVE) ×2
GLOVE BIOGEL PI INDICATOR 7.5 (GLOVE) ×1
GLOVE BIOGEL PI INDICATOR 9 (GLOVE) ×1
GLOVE INDICATOR 8.0 STRL GRN (GLOVE) ×6 IMPLANT
GLOVE SURG SYN 9.0  PF PI (GLOVE) ×2
GLOVE SURG SYN 9.0 PF PI (GLOVE) ×2 IMPLANT
GOWN STRL REUS W/ TWL LRG LVL3 (GOWN DISPOSABLE) ×2 IMPLANT
GOWN STRL REUS W/TWL 2XL LVL3 (GOWN DISPOSABLE) ×2 IMPLANT
GOWN STRL REUS W/TWL LRG LVL3 (GOWN DISPOSABLE) ×2
HEMOVAC 400CC 10FR (MISCELLANEOUS) ×2 IMPLANT
HOLDER FOLEY CATH W/STRAP (MISCELLANEOUS) ×2 IMPLANT
HOOD PEEL AWAY FLYTE STAYCOOL (MISCELLANEOUS) ×4 IMPLANT
KIT RM TURNOVER STRD PROC AR (KITS) ×2 IMPLANT
KNIFE SCULPS 14X20 (INSTRUMENTS) ×2 IMPLANT
LABEL OR SOLS (LABEL) ×2 IMPLANT
NDL SAFETY 18GX1.5 (NEEDLE) ×2 IMPLANT
NEEDLE SPNL 20GX3.5 QUINCKE YW (NEEDLE) ×2 IMPLANT
NS IRRIG 500ML POUR BTL (IV SOLUTION) ×2 IMPLANT
PACK TOTAL KNEE (MISCELLANEOUS) ×2 IMPLANT
PAD WRAPON POLAR KNEE (MISCELLANEOUS) ×1 IMPLANT
PIN DRILL QUICK PACK ×2 IMPLANT
PIN FIXATION 1/8DIA X 3INL (PIN) ×2 IMPLANT
PULSAVAC PLUS IRRIG FAN TIP (DISPOSABLE) ×2
SOL .9 NS 3000ML IRR  AL (IV SOLUTION) ×1
SOL .9 NS 3000ML IRR UROMATIC (IV SOLUTION) ×1 IMPLANT
SOL PREP PVP 2OZ (MISCELLANEOUS) ×2
SOLUTION PREP PVP 2OZ (MISCELLANEOUS) ×1 IMPLANT
SPONGE DRAIN TRACH 4X4 STRL 2S (GAUZE/BANDAGES/DRESSINGS) ×2 IMPLANT
STAPLER SKIN PROX 35W (STAPLE) ×2 IMPLANT
STRAP TIBIA SHORT (MISCELLANEOUS) ×2 IMPLANT
SUCTION FRAZIER HANDLE 10FR (MISCELLANEOUS) ×1
SUCTION TUBE FRAZIER 10FR DISP (MISCELLANEOUS) ×1 IMPLANT
SUT VIC AB 0 CT1 36 (SUTURE) ×2 IMPLANT
SUT VIC AB 1 CT1 36 (SUTURE) ×4 IMPLANT
SUT VIC AB 2-0 CT2 27 (SUTURE) ×2 IMPLANT
SYR 20CC LL (SYRINGE) ×2 IMPLANT
SYR 30ML LL (SYRINGE) ×4 IMPLANT
TIP FAN IRRIG PULSAVAC PLUS (DISPOSABLE) ×1 IMPLANT
TOWEL OR 17X26 4PK STRL BLUE (TOWEL DISPOSABLE) ×2 IMPLANT
TOWER CARTRIDGE SMART MIX (DISPOSABLE) ×2 IMPLANT
WRAPON POLAR PAD KNEE (MISCELLANEOUS) ×2

## 2017-06-23 NOTE — Op Note (Signed)
OPERATIVE NOTE  DATE OF SURGERY:  06/23/2017  PATIENT NAME:  Sheila Long   DOB: Jun 20, 1924  MRN: [id number]  PRE-OPERATIVE DIAGNOSIS: Degenerative arthrosis of the right knee, primary  POST-OPERATIVE DIAGNOSIS:  Same  PROCEDURE:  Right total knee arthroplasty using computer-assisted navigation  SURGEON:  Jena Gauss. M.D.  ASSISTANT:  Van Clines, PA (present and scrubbed throughout the case, critical for assistance with exposure, retraction, instrumentation, and closure)  ANESTHESIA: spinal  ESTIMATED BLOOD LOSS: 50 mL  FLUIDS REPLACED: 1400 mL of crystalloid  TOURNIQUET TIME: 84 minutes  DRAINS: 2 medium Hemovac drains  SOFT TISSUE RELEASES: Anterior cruciate ligament, posterior cruciate ligament, deep and superficial medial collateral ligament, patellofemoral ligament  IMPLANTS UTILIZED: DePuy Attune size 6 posterior stabilized femoral component (cemented), size 6 rotating platform tibial component (cemented), 35 mm medialized dome patella (cemented), and a 5 mm stabilized rotating platform polyethylene insert.  INDICATIONS FOR SURGERY: Sheila Long is a 83 y.o. year old female with a long history of progressive knee pain. X-rays demonstrated severe degenerative changes in tricompartmental fashion. The patient had not seen any significant improvement despite conservative nonsurgical intervention. After discussion of the risks and benefits of surgical intervention, the patient expressed understanding of the risks benefits and agree with plans for total knee arthroplasty.   The risks, benefits, and alternatives were discussed at length including but not limited to the risks of infection, bleeding, nerve injury, stiffness, blood clots, the need for revision surgery, cardiopulmonary complications, among others, and they were willing to proceed.  PROCEDURE IN DETAIL: The patient was brought into the operating room and, after adequate spinal anesthesia was  achieved, a tourniquet was placed on the patient's upper thigh. The patient's knee and leg were cleaned and prepped with alcohol and DuraPrep and draped in the usual sterile fashion. A "timeout" was performed as per usual protocol. The lower extremity was exsanguinated using an Esmarch, and the tourniquet was inflated to 300 mmHg. An anterior longitudinal incision was made followed by a standard mid vastus approach. The deep fibers of the medial collateral ligament were elevated in a subperiosteal fashion off of the medial flare of the tibia so as to maintain a continuous soft tissue sleeve. The patella was subluxed laterally and the patellofemoral ligament was incised. Inspection of the knee demonstrated severe degenerative changes with full-thickness loss of articular cartilage. Osteophytes were debrided using a rongeur. Anterior and posterior cruciate ligaments were excised. Two 4.0 mm Schanz pins were inserted in the femur and into the tibia for attachment of the array of trackers used for computer-assisted navigation. Hip center was identified using a circumduction technique. Distal landmarks were mapped using the computer. The distal femur and proximal tibia were mapped using the computer. The distal femoral cutting guide was positioned using computer-assisted navigation so as to achieve a 5 distal valgus cut. The femur was sized and it was felt that a size 6 femoral component was appropriate. A size 6 femoral cutting guide was positioned and the anterior cut was performed and verified using the computer. This was followed by completion of the posterior and chamfer cuts. Femoral cutting guide for the central box was then positioned in the center box cut was performed.  Attention was then directed to the proximal tibia. Medial and lateral menisci were excised. The extramedullary tibial cutting guide was positioned using computer-assisted navigation so as to achieve a 0 varus-valgus alignment and 3  posterior slope. The cut was performed and verified using the computer. The proximal tibia  was sized and it was felt that a size 6 tibial tray was appropriate. Tibial and femoral trials were inserted followed by insertion of a 5 mm polyethylene insert. .The knee was felt to be tight medially. A Cobb elevator was used to elevate the superficial fibers of the medial collateral ligament. This allowed for excellent mediolateral soft tissue balancing both in flexion and in full extension. Finally, the patella was cut and prepared so as to accommodate a 35 mm medialized dome patella. A patella trial was placed and the knee was placed through a range of motion with excellent patellar tracking appreciated. The femoral trial was removed after debridement of posterior osteophytes. The central post-hole for the tibial component was reamed followed by insertion of a keel punch. Tibial trials were then removed. Cut surfaces of bone were irrigated with copious amounts of normal saline with antibiotic solution using pulsatile lavage and then suctioned dry. Polymethylmethacrylate cement with gentamicin was prepared in the usual fashion using a vacuum mixer. Cement was applied to the cut surface of the proximal tibia as well as along the undersurface of a size 6 rotating platform tibial component. Tibial component was positioned and impacted into place. Excess cement was removed using Personal assistantreer elevators. Cement was then applied to the cut surfaces of the femur as well as along the posterior flanges of the size 6 femoral component. The femoral component was positioned and impacted into place. Excess cement was removed using Personal assistantreer elevators. A 5 mm polyethylene trial was inserted and the knee was brought into full extension with steady axial compression applied. Finally, cement was applied to the backside of a 35 mm medialized dome patella and the patellar component was positioned and patellar clamp applied. Excess cement was removed  using Personal assistantreer elevators. After adequate curing of the cement, the tourniquet was deflated after a total tourniquet time of 84 minutes. Hemostasis was achieved using electrocautery. The knee was irrigated with copious amounts of normal saline with antibiotic solution using pulsatile lavage and then suctioned dry. 20 mL of 1.3% Exparel and 60 mL of 0.25% Marcaine in 40 mL of normal saline was injected along the posterior capsule, medial and lateral gutters, and along the arthrotomy site. A 5 mm stabilized rotating platform polyethylene insert was inserted and the knee was placed through a range of motion with excellent mediolateral soft tissue balancing appreciated and excellent patellar tracking noted. 2 medium drains were placed in the wound bed and brought out through separate stab incisions. The medial parapatellar portion of the incision was reapproximated using interrupted sutures of #1 Vicryl. Subcutaneous tissue was approximated in layers using first #0 Vicryl followed #2-0 Vicryl. The skin was approximated with skin staples. A sterile dressing was applied.  The patient tolerated the procedure well and was transported to the recovery room in stable condition.    Tula Schryver P. Angie FavaHooten, Jr., M.D.

## 2017-06-23 NOTE — Anesthesia Procedure Notes (Addendum)
Spinal  Patient location during procedure: OR Start time: 06/23/2017 10:46 AM End time: 06/23/2017 11:01 AM Staffing Anesthesiologist: Rosaria FerriesPISCITELLO, JOSEPH K Resident/CRNA: Casey BurkittHOANG, Charman Blasco Performed: anesthesiologist and resident/CRNA  Preanesthetic Checklist Completed: patient identified, site marked, surgical consent, pre-op evaluation, timeout performed, IV checked, risks and benefits discussed and monitors and equipment checked Spinal Block Patient position: sitting Prep: Betadine Patient monitoring: heart rate, continuous pulse ox and blood pressure Approach: midline Location: L3-4 Injection technique: single-shot Needle Needle type: Whitacre  Needle gauge: 24 G Needle length: 9 cm Assessment Sensory level: T4 Additional Notes One atttempt by CRNA, unsuccessful. Second attempt by MDA in different interspace with +CSF return. Patient tolerated well.

## 2017-06-23 NOTE — Plan of Care (Signed)
Problem: Safety: Goal: Ability to remain free from injury will improve Outcome: Progressing Pt verbalized understanding of calling for assistance when needed and demonstrated the proper use of call bell and room phone.

## 2017-06-23 NOTE — H&P (Signed)
The patient has been re-examined, and the chart reviewed, and there have been no interval changes to the documented history and physical.    The risks, benefits, and alternatives have been discussed at length. The patient expressed understanding of the risks benefits and agreed with plans for surgical intervention.  Sephira Zellman P. Denym Rahimi, Jr. M.D.    

## 2017-06-23 NOTE — Transfer of Care (Signed)
Immediate Anesthesia Transfer of Care Note  [name]  Procedure(s) Performed: Procedure(s): COMPUTER ASSISTED TOTAL KNEE ARTHROPLASTY (Right)  Patient Location: PACU  Anesthesia Type:Spinal  Level of Consciousness: sedated and responds to stimulation  Airway & Oxygen Therapy: Patient Spontanous Breathing and Patient connected to face mask oxygen  Post-op Assessment: Report given to RN and Post -op Vital signs reviewed and stable  Post vital signs: Reviewed and stable  Last Vitals:  Vitals:   06/23/17 0939 06/23/17 1426  BP: (!) 163/81 93/61  Pulse: 79 66  Resp: 18 19  Temp: 36.4 C     Last Pain:  Vitals:   06/23/17 0939  TempSrc: Oral         Complications: No apparent anesthesia complications

## 2017-06-23 NOTE — Anesthesia Preprocedure Evaluation (Signed)
Anesthesia Evaluation  Patient identified by MRN, date of birth, ID band Patient awake    Reviewed: Allergy & Precautions, H&P , NPO status , Patient's Chart, lab work & pertinent test results  History of Anesthesia Complications Negative for: history of anesthetic complications  Airway Mallampati: III  TM Distance: >3 FB Neck ROM: limited    Dental  (+) Poor Dentition, Chipped, Caps   Pulmonary neg shortness of breath, sleep apnea and Continuous Positive Airway Pressure Ventilation ,           Cardiovascular Exercise Tolerance: Good hypertension, (-) angina+ CAD, + Cardiac Stents and + Peripheral Vascular Disease  (-) Past MI      Neuro/Psych PSYCHIATRIC DISORDERS Anxiety negative neurological ROS     GI/Hepatic Neg liver ROS, GERD  Controlled,  Endo/Other  negative endocrine ROS  Renal/GU      Musculoskeletal  (+) Arthritis ,   Abdominal   Peds  Hematology negative hematology ROS (+)   Anesthesia Other Findings Patient endorses baseline backpain  Past Medical History: No date: Allergic rhinitis due to pollen No date: Anxiety No date: Aortic atherosclerosis (HCC) No date: Coronary artery disease     Comment: Stent in 2000's (DC) No date: Dyspnea No date: GERD (gastroesophageal reflux disease) No date: Hard of hearing No date: Hypertension No date: Neuropathy No date: Obstructive sleep apnea No date: Osteoarthritis of both knees No date: Peripheral vascular disease (HCC) No date: Sarcoidosis No date: Thyroid nodule  Past Surgical History: 2014: BIOPSY THYROID     Comment: Dr Andee PolesVaught No date: BREAST BIOPSY Left     Comment: surgical bx neg 05/12/2012: CARDIOVASCULAR STRESS TEST     Comment: with dobutamine---negative. Normal LV function 2006: CORONARY ANGIOPLASTY 2000: JOINT REPLACEMENT Left     Comment: hip 2007: JOINT REPLACEMENT Right     Comment: hip  BMI    Body Mass Index:  37.08 kg/m       Reproductive/Obstetrics negative OB ROS                             Anesthesia Physical Anesthesia Plan  ASA: III  Anesthesia Plan: Spinal   Post-op Pain Management:    Induction:   PONV Risk Score and Plan: 2 and Ondansetron  Airway Management Planned: Natural Airway and Nasal Cannula  Additional Equipment:   Intra-op Plan:   Post-operative Plan:   Informed Consent: I have reviewed the patients History and Physical, chart, labs and discussed the procedure including the risks, benefits and alternatives for the proposed anesthesia with the patient or authorized representative who has indicated his/her understanding and acceptance.   Dental Advisory Given  Plan Discussed with: Anesthesiologist, CRNA and Surgeon  Anesthesia Plan Comments: (Patient had many questions about spinal that were answered.  Patient reports no bleeding problems and no anticoagulant use.  Plan for spinal with backup GA  Patient consented for risks of anesthesia including but not limited to:  - adverse reactions to medications - risk of bleeding, infection, nerve damage and headache - risk of failed spinal - damage to teeth, lips or other oral mucosa - sore throat or hoarseness - Damage to heart, brain, lungs or loss of life  Patient voiced understanding.)        Anesthesia Quick Evaluation

## 2017-06-23 NOTE — Progress Notes (Signed)
Pt arrived via bed from PACU at approx 1500. Pt awake, alert and oriented, on room air, lungs clear bilat, HR is regular, abdomen is soft, bs hypoactive. Pt denied pain, denied nausea on arrival. Foley draining yellow urine, r knee has stockinette and polar care in place, teds and foot pumps in place, ppp. piv #20 intact to L arm with iv ns infusing per md order. Family members at bedside with pt. Pt is oriented to room and call bell, and initial plan of care. Pt tolerating clear liquids and diet ordered.  md orders were reviewed with pt as well. Since arrival, pt has tolerated dinner, has acknowledged pain to r knee but refused pain medication consistently when offered, stating that her pain was not bad enough yet to take anything for it. Bone foam also explained to pt. Pt only complains of feeling cold, was given another blanket and room temperature was adjusted for comfort.

## 2017-06-24 ENCOUNTER — Encounter
Admission: RE | Admit: 2017-06-24 | Discharge: 2017-06-24 | Disposition: A | Discharge status: Home / Self Care | Payer: PPO | Source: Ambulatory Visit | Attending: Internal Medicine | Admitting: Internal Medicine

## 2017-06-24 LAB — BASIC METABOLIC PANEL
Anion gap: 3 — ABNORMAL LOW (ref 5–15)
BUN: 15 mg/dL (ref 6–20)
CO2: 23 mmol/L (ref 22–32)
Calcium: 8.2 mg/dL — ABNORMAL LOW (ref 8.9–10.3)
Chloride: 110 mmol/L (ref 101–111)
Creatinine, Ser: 1.04 mg/dL — ABNORMAL HIGH (ref 0.44–1.00)
GFR calc Af Amer: 53 mL/min — ABNORMAL LOW (ref >60)
GFR calc non Af Amer: 46 mL/min — ABNORMAL LOW (ref >60)
Glucose, Bld: 145 mg/dL — ABNORMAL HIGH (ref 65–99)
Potassium: 4.6 mmol/L (ref 3.5–5.1)
Sodium: 136 mmol/L (ref 135–145)

## 2017-06-24 LAB — CBC
HCT: 33.5 % — ABNORMAL LOW (ref 35.0–47.0)
Hemoglobin: 11.6 g/dL — ABNORMAL LOW (ref 12.0–16.0)
MCH: 33.9 pg (ref 26.0–34.0)
MCHC: 34.6 g/dL (ref 32.0–36.0)
MCV: 97.9 fL (ref 80.0–100.0)
Platelets: 155 10*3/uL (ref 150–440)
RBC: 3.42 MIL/uL — ABNORMAL LOW (ref 3.80–5.20)
RDW: 14.2 % (ref 11.5–14.5)
WBC: 9.1 10*3/uL (ref 3.6–11.0)

## 2017-06-24 MED ORDER — TRAMADOL HCL 50 MG PO TABS
50.0000 mg | ORAL_TABLET | ORAL | Status: DC | PRN
  Administered 2017-06-24: 100 mg via ORAL
  Administered 2017-06-25: 50 mg via ORAL
  Administered 2017-06-25 (×2): 100 mg via ORAL
  Filled 2017-06-24: qty 2
  Filled 2017-06-24: qty 1
  Filled 2017-06-24 (×2): qty 2

## 2017-06-24 NOTE — Progress Notes (Signed)
Clinical Child psychotherapistocial Worker (CSW) presented bed offers to patient and [facility]. Health Team case manager aware of above. CSW will continue to follow and assist as needed.   Baker Hughes IncorporatedBailey Conall Vangorder, LCSW [phone]

## 2017-06-24 NOTE — NC FL2 (Signed)
[place] MEDICAID FL2 LEVEL OF CARE SCREENING TOOL     IDENTIFICATION  Patient Name: Sheila Long Birthdate: 07/12/1924 Sex: female Admission Date (Current Location): 06/23/2017  [place] Number:  Chiropodist and Address:  [facility], [address], [place], [place] [address]      Provider Number: [id number]  Attending Physician Name and Address:  Donato Heinz, MD  Relative Name and Phone Number:       Current Level of Care: Hospital Recommended Level of Care: Skilled Nursing Facility Prior Approval Number:    Date Approved/Denied:   PASRR Number:  [id number] )  Discharge Plan: SNF    Current Diagnoses: Patient Active Problem List   Diagnosis Date Noted  . S/P total knee arthroplasty 06/23/2017  . Pulmonary hypertension associated with sarcoidosis (HCC) 02/03/2017  . Dyspnea on exertion 11/28/2016  . Lower extremity edema 11/28/2016  . Breast pain, left 10/15/2016  . Thigh pain 10/15/2016  . Familial tremor 09/24/2016  . Sarcoidosis   . Hypertension   . Allergic rhinitis due to pollen   . Coronary artery disease   . Aortic atherosclerosis (HCC)   . GERD (gastroesophageal reflux disease)   . Peripheral vascular disease (HCC)   . Osteoarthritis of both knees   . Obstructive sleep apnea   . Neuropathy     Orientation RESPIRATION BLADDER Height & Weight     Self, Time, Situation, Place  Normal Continent Weight: 216 lb (98 kg) Height:  5\' 4"  (162.6 cm)  BEHAVIORAL SYMPTOMS/MOOD NEUROLOGICAL BOWEL NUTRITION STATUS   (none)  (none) Continent Diet (Diet: Carb Modified )  AMBULATORY STATUS COMMUNICATION OF NEEDS Skin   Extensive Assist Verbally Surgical wounds (Incision: Right Knee )                       Personal Care Assistance Level of Assistance  Bathing, Feeding, Dressing Bathing Assistance: Limited assistance Feeding assistance: Independent Dressing Assistance: Limited assistance      Functional Limitations Info  Sight, Hearing, Speech Sight Info: Adequate Hearing Info: Impaired Speech Info: Adequate    SPECIAL CARE FACTORS FREQUENCY  PT (By licensed PT), OT (By licensed OT)     PT Frequency:  (5) OT Frequency:  (5)            Contractures      Additional Factors Info  Code Status, Allergies Code Status Info:  (Full Code. ) Allergies Info:  (Tramadol, Sulfa Antibiotics, Zocor Simvastatin)           Current Medications (06/24/2017):  This is the current hospital active medication list Current Facility-Administered Medications  Medication Dose Route Frequency Provider Last Rate Last Dose  . 0.9 %  sodium chloride infusion   Intravenous Continuous Donato Heinz, MD 100 mL/hr at 06/23/17 2054 100 mL/hr at 06/23/17 2054  . acetaminophen (TYLENOL) tablet 650 mg  650 mg Oral Q6H PRN Donato Heinz, MD   650 mg at 06/24/17 0805   Or  . acetaminophen (TYLENOL) suppository 650 mg  650 mg Rectal Q6H PRN Hooten, Illene Labrador, MD      . albuterol (PROVENTIL) (2.5 MG/3ML) 0.083% nebulizer solution 2.5 mg  2.5 mg Nebulization Q4H PRN Hooten, Illene Labrador, MD      . alum & mag hydroxide-simeth (MAALOX/MYLANTA) 200-200-20 MG/5ML suspension 30 mL  30 mL Oral Q4H PRN Hooten, Illene Labrador, MD      . amLODipine (NORVASC) tablet 5 mg  5 mg Oral  Daily Hooten, Illene LabradorJames P, MD   5 mg at 06/24/17 0950  . bisacodyl (DULCOLAX) suppository 10 mg  10 mg Rectal Daily PRN Hooten, Illene LabradorJames P, MD      . ceFAZolin (ANCEF) IVPB 2g/100 mL premix  2 g Intravenous Q6H Hooten, Illene LabradorJames P, MD   Stopped at 06/24/17 1134  . diphenhydrAMINE (BENADRYL) 12.5 MG/5ML elixir 12.5-25 mg  12.5-25 mg Oral Q4H PRN Hooten, Illene LabradorJames P, MD      . enoxaparin (LOVENOX) injection 30 mg  30 mg Subcutaneous Q12H Hooten, Illene LabradorJames P, MD   30 mg at 06/24/17 0806  . ferrous sulfate tablet 325 mg  325 mg Oral BID WC Hooten, Illene LabradorJames P, MD   325 mg at 06/24/17 0805  . furosemide (LASIX) tablet 20 mg  20 mg Oral Once per day on Mon Thu Hooten,  Illene LabradorJames P, MD      . latanoprost (XALATAN) 0.005 % ophthalmic solution 1 drop  1 drop Both Eyes QHS Hooten, Illene LabradorJames P, MD   1 drop at 06/23/17 2054  . LORazepam (ATIVAN) tablet 0.5 mg  0.5 mg Oral QHS PRN Donato HeinzHooten, James P, MD   0.5 mg at 06/23/17 2312  . magnesium hydroxide (MILK OF MAGNESIA) suspension 30 mL  30 mL Oral Daily PRN Hooten, Illene LabradorJames P, MD      . menthol-cetylpyridinium (CEPACOL) lozenge 3 mg  1 lozenge Oral PRN Hooten, Illene LabradorJames P, MD       Or  . phenol (CHLORASEPTIC) mouth spray 1 spray  1 spray Mouth/Throat PRN Hooten, Illene LabradorJames P, MD      . metoCLOPramide (REGLAN) tablet 10 mg  10 mg Oral TID AC & HS Hooten, Illene LabradorJames P, MD   10 mg at 06/24/17 1213  . metoprolol succinate (TOPROL-XL) 24 hr tablet 100 mg  100 mg Oral Daily Hooten, Illene LabradorJames P, MD   100 mg at 06/24/17 0950  . morphine 2 MG/ML injection 2 mg  2 mg Intravenous Q2H PRN Hooten, Illene LabradorJames P, MD      . ondansetron (ZOFRAN) tablet 4 mg  4 mg Oral Q6H PRN Hooten, Illene LabradorJames P, MD       Or  . ondansetron (ZOFRAN) injection 4 mg  4 mg Intravenous Q6H PRN Hooten, Illene LabradorJames P, MD      . pantoprazole (PROTONIX) EC tablet 40 mg  40 mg Oral BID Donato HeinzHooten, James P, MD   40 mg at 06/24/17 0950  . senna-docusate (Senokot-S) tablet 1 tablet  1 tablet Oral BID Donato HeinzHooten, James P, MD   1 tablet at 06/24/17 0950  . sertraline (ZOLOFT) tablet 25 mg  25 mg Oral Daily PRN Hooten, Illene LabradorJames P, MD      . sodium phosphate (FLEET) 7-19 GM/118ML enema 1 enema  1 enema Rectal Once PRN Hooten, Illene LabradorJames P, MD      . traMADol Janean Sark(ULTRAM) tablet 50-100 mg  50-100 mg Oral Q4H PRN Hooten, Illene LabradorJames P, MD         Discharge Medications: Please see discharge summary for a list of discharge medications.  Relevant Imaging Results:  Relevant Lab Results:   Additional Information  (SSN: [id number])  Tavonna Worthington, Darleen CrockerBailey M, LCSW

## 2017-06-24 NOTE — Evaluation (Signed)
Occupational Therapy Evaluation Patient Details Name: Sheila Long MRN: [id number] DOB: 10/23/1924 Today's Date: 06/24/2017    History of Present Illness 83 y/o female s/p R TKA 6/25.   Clinical Impression   Pt seen for OT evaluation this date. Pt was independent living alone, driving, and no falls reported in past 12 months prior to R TKA on 6/25. Pt reports having nephew who lives in [place] who can come stay with pt after some time in STR but not immediately following this hospitalization. Pt presents with deficits in strength/ROM in RLE, activity tolerance, and knowledge of AE/DME for LB dressing tasks given deficits in RLE. Pt will benefit from skilled OT services to maximize return to PLOF and minimize falls risk and increased need for caregiver burden. Recommend STR following hospitalization prior to return home.     Follow Up Recommendations  SNF    Equipment Recommendations  None recommended by OT    Recommendations for Other Services       Precautions / Restrictions Precautions Precautions: Knee;Fall Restrictions Weight Bearing Restrictions: No      Mobility Bed Mobility     General bed mobility comments: deferred d/t pt in recliner for session  Transfers    Balance                                           ADL either performed or assessed with clinical judgement   ADL Overall ADL's : Needs assistance/impaired Eating/Feeding: Sitting;Set up   Grooming: Sitting;Set up   Upper Body Bathing: Sitting;Set up;Supervision/ safety   Lower Body Bathing: Minimal assistance;Sitting/lateral leans;Sit to/from stand   Upper Body Dressing : Sitting;Supervision/safety;Set up   Lower Body Dressing: Sit to/from stand;Sitting/lateral leans;Minimal assistance Lower Body Dressing Details (indicate cue type and reason): pt educated in AE for LB dressing tasks, pt verbalized understanding, would benefit from additional training to ensure carryover of  good technique               General ADL Comments: pt generally min assist level for LB ADL     Vision Baseline Vision/History: Wears glasses Wears Glasses: At all times Patient Visual Report: No change from baseline Vision Assessment?: No apparent visual deficits     Perception     Praxis      Pertinent Vitals/Pain Pain Assessment: No/denies pain Pain Score: 4      Hand Dominance     Extremity/Trunk Assessment Upper Extremity Assessment Upper Extremity Assessment: Overall WFL for tasks assessed   Lower Extremity Assessment Lower Extremity Assessment: Generalized weakness   Cervical / Trunk Assessment Cervical / Trunk Assessment: Normal   Communication Communication Communication: HOH (has hearing aides but no batteries currently)   Cognition Arousal/Alertness: Awake/alert Behavior During Therapy: WFL for tasks assessed/performed Overall Cognitive Status: Within Functional Limits for tasks assessed                                     General Comments       Exercises Other Exercises Other Exercises: pt educated in home/routines modifications to minimize falls risk, maximize functional independence with ADL, pt verbalized understanding   Shoulder Instructions      Home Living Family/patient expects to be discharged to:: Skilled nursing facility Living Arrangements: Alone  Additional Comments: pt reports nephew planning to stay with her from [place] after STR, but unable to assist with bathing; 2 STE home w/ R hand rail, tub/shower converted to walk-in, has SPC, 2WW, BSC, crutches, reacher, sock aid, LH shoe horn, LH sponge, handheld showerhead      Prior Functioning/Environment Level of Independence: Independent                 OT Problem List: Decreased strength;Decreased range of motion;Decreased activity tolerance;Impaired balance (sitting and/or standing);Decreased knowledge of use of  DME or AE      OT Treatment/Interventions: Self-care/ADL training;Therapeutic exercise;Therapeutic activities;Energy conservation;DME and/or AE instruction;Patient/family education    OT Goals(Current goals can be found in the care plan section) Acute Rehab OT Goals Patient Stated Goal: get stronger OT Goal Formulation: With patient Time For Goal Achievement: 07/08/17 Potential to Achieve Goals: Good ADL Goals Pt Will Perform Lower Body Dressing: with set-up;with supervision;with adaptive equipment;sit to/from stand;sitting/lateral leans Pt Will Transfer to Toilet: with min guard assist;ambulating (comfort height toilet, RW for ambulation)  OT Frequency: Min 1X/week   Barriers to D/C: Decreased caregiver support;Inaccessible home environment          Co-evaluation              AM-PAC PT "6 Clicks" Daily Activity     Outcome Measure Help from another person eating meals?: None Help from another person taking care of personal grooming?: None Help from another person toileting, which includes using toliet, bedpan, or urinal?: A Little Help from another person bathing (including washing, rinsing, drying)?: A Little Help from another person to put on and taking off regular upper body clothing?: A Little Help from another person to put on and taking off regular lower body clothing?: A Little 6 Click Score: 20   End of Session    Activity Tolerance: Patient tolerated treatment well Patient left: in chair;with call bell/phone within reach;with chair alarm set;Other (comment) (polar care in place)  OT Visit Diagnosis: Other abnormalities of gait and mobility (R26.89);Muscle weakness (generalized) (M62.81)                Time: 1010-1027 OT Time Calculation (min): 17 min Charges:  OT General Charges $OT Visit: 1 Procedure OT Evaluation $OT Eval Low Complexity: 1 Procedure G-Codes:     Richrd Prime, MPH, MS, OTR/L ascom [phone] 06/24/17, 1:39 PM

## 2017-06-24 NOTE — Clinical Social Work Note (Signed)
Clinical Social Work Assessment  Patient Details  Name: Sheila Long MRN: [id number] Date of Birth: 23-Apr-1924  Date of referral:  06/24/17               Reason for consult:  Facility Placement                Permission sought to share information with:  Chartered certified accountant granted to share information::  Yes, Verbal Permission Granted  Name::      [name]::   [name]   Relationship::     Contact Information:     Housing/Transportation Living arrangements for the past 2 months:  [place] of Information:  Patient Patient Interpreter Needed:  None Criminal Activity/Legal Involvement Pertinent to Current Situation/Hospitalization:  No - Comment as needed Significant Relationships:  Siblings, Friend, Other Family Members Lives with:  Self Do you feel safe going back to the place where you live?  Yes Need for family participation in patient care:  Yes (Comment)  Care giving concerns:  Patient lives alone in [place].    Social Worker assessment / plan:  Holiday representative (CSW) received SNF consult. PT is recommending SNF. CSW met with patient alone at bedside to discuss D/C plan. Patient was alert and oriented X4 and was sitting up in the chair at bedside. CSW introduced self and explained role of CSW department. CSW is familiar with patient from joint class prior to surgery. Patient reported that she lives alone and has no HPOA. Per patient her sister [name] is her emergency contact. CSW explained SNF process and that Health Team will have to approve SNF. Patient verbalized her understanding and is agreeable to SNF search in [place]. Patient prefers [employer] FL2 complete and faxed out. CSW will continue to follow and assist as needed.   Employment status:  Retired Nurse, adult PT Recommendations:  [place] / Referral to  community resources:  [place]  Patient/Family's Response to care:  Patient is agreeable to [facility] in [place].   Patient/Family's Understanding of and Emotional Response to Diagnosis, Current Treatment, and Prognosis:  Patient was very pleasant and thanked CSW for assistance.   Emotional Assessment Appearance:  Appears stated age Attitude/Demeanor/Rapport:    Affect (typically observed):  Accepting, Adaptable, Pleasant Orientation:  Oriented to Self, Oriented to Place, Oriented to  Time, Oriented to Situation Alcohol / Substance use:  Not Applicable Psych involvement (Current and /or in the community):  No (Comment)  Discharge Needs  Concerns to be addressed:  Discharge Planning Concerns Readmission within the last 30 days:  No Current discharge risk:  Dependent with Mobility Barriers to Discharge:  Continued Medical Work up   [employer], Veronia Beets, LCSW 06/24/2017, 1:40 PM

## 2017-06-24 NOTE — Progress Notes (Signed)
Physical Therapy Treatment Patient Details Name: Sheila Long MRN: [id number] DOB: 08-07-1924 Today's Date: 06/24/2017    History of Present Illness 83 y/o female s/p R TKA 6/25.    PT Comments    Pt continues to be pleasant and eager to participate with PT.  She is having some pain but not more than expected post-op day 1, she showed increased independence with mobility and generally showed very good effort with all mobility, ambulation and exercises.  Pt struggled to do a lot of resisted exercises but ultimately is doing well post TKA.   Follow Up Recommendations  SNF     Equipment Recommendations       Recommendations for Other Services       Precautions / Restrictions Precautions Precautions: Knee;Fall Precaution Booklet Issued: Yes (comment) Restrictions Weight Bearing Restrictions: Yes RLE Weight Bearing: Weight bearing as tolerated    Mobility  Bed Mobility Overal bed mobility: Needs Assistance Bed Mobility: Sit to Supine       Sit to supine: Min guard   General bed mobility comments: Pt showed good effort with getting LEs back into bed, needed cuing and CGA but no direct assist  Transfers Overall transfer level: Needs assistance Equipment used: Rolling walker (2 wheeled) Transfers: Sit to/from Stand Sit to Stand: Min guard         General transfer comment: Pt able to rise to standing w/o direct assist, needed cues for set up and sequencing but generally did well.   Ambulation/Gait Ambulation/Gait assistance: Min assist Ambulation Distance (Feet): 50 Feet Assistive device: Rolling walker (2 wheeled)       General Gait Details: Pt again with slow, cautious ambulation but overall did well and did not have any LOBs or overt safety issues.    Stairs            Wheelchair Mobility    Modified Rankin (Stroke Patients Only)       Balance Overall balance assessment: Needs assistance   Sitting balance-Leahy Scale: Good        Standing balance-Leahy Scale: Fair                              Cognition Arousal/Alertness: Awake/alert Behavior During Therapy: WFL for tasks assessed/performed Overall Cognitive Status: Within Functional Limits for tasks assessed                                        Exercises Total Joint Exercises Ankle Circles/Pumps: Strengthening;10 reps Quad Sets: Strengthening;10 reps Gluteal Sets: Strengthening;10 reps Short Arc Quad: Strengthening;10 reps Heel Slides: AROM;10 reps Hip ABduction/ADduction: Strengthening;10 reps Straight Leg Raises: AROM;10 reps (light UE use to elevate R LE against gravity) Knee Flexion: PROM;5 reps Other Exercises Other Exercises: pt educated in home/routines modifications to minimize falls risk, maximize functional independence with ADL, pt verbalized understanding    General Comments        Pertinent Vitals/Pain Pain Assessment: 0-10 Pain Score: 5     Home Living Family/patient expects to be discharged to:: Skilled nursing facility Living Arrangements: Alone             Additional Comments: pt reports nephew planning to stay with her from [place] after STR, but unable to assist with bathing; 2 STE home w/ R hand rail, tub/shower converted to walk-in, has SPC, 2WW, BSC, crutches, reacher, sock aid, LH  shoe horn, LH sponge, handheld showerhead    Prior Function Level of Independence: Independent          PT Goals (current goals can now be found in the care plan section) Acute Rehab PT Goals Patient Stated Goal: get stronger Progress towards PT goals: Progressing toward goals    Frequency    BID      PT Plan Current plan remains appropriate    Co-evaluation              AM-PAC PT "6 Clicks" Daily Activity  Outcome Measure  Difficulty turning over in bed (including adjusting bedclothes, sheets and blankets)?: A Lot Difficulty moving from lying on back to sitting on the side of the bed? : A  Lot Difficulty sitting down on and standing up from a chair with arms (e.g., wheelchair, bedside commode, etc,.)?: A Little Help needed moving to and from a bed to chair (including a wheelchair)?: A Little Help needed walking in hospital room?: A Little Help needed climbing 3-5 steps with a railing? : A Lot 6 Click Score: 15    End of Session Equipment Utilized During Treatment: Gait belt Activity Tolerance: Patient limited by pain Patient left: with call bell/phone within reach;with bed alarm set   PT Visit Diagnosis: Muscle weakness (generalized) (M62.81);Difficulty in walking, not elsewhere classified (R26.2)     Time: 2440-1027 PT Time Calculation (min) (ACUTE ONLY): 32 min  Charges:  $Gait Training: 8-22 mins $Therapeutic Exercise: 8-22 mins                    G Codes:       Malachi Pro, DPT 06/24/2017, 3:27 PM

## 2017-06-24 NOTE — Progress Notes (Signed)
ORTHOPAEDICS PROGRESS NOTE  PATIENT NAME: Sheila Long DOB: 08/25/1924  MRN: [id number]  POD # 1: Right total knee arthroplasty  Subjective: The patient rested recently well last night. She denied any nausea. The patient states that she had some pain last night it was controlled with medications. The patient sat at the side of the bed and dangled with nursing assistance yesterday.  Objective: Vital signs in last 24 hours: Temp:  [97.4 F (36.3 C)-98.9 F (37.2 C)] 98.1 F (36.7 C) (06/26 0427) Pulse Rate:  [53-79] 68 (06/26 0427) Resp:  [12-25] 18 (06/26 0427) BP: (93-163)/(60-94) 129/61 (06/26 0427) SpO2:  [98 %-100 %] 100 % (06/26 0427) FiO2 (%):  [21 %] 21 % (06/25 1531) Weight:  [98 kg (216 lb)] 98 kg (216 lb) (06/25 0939)  Intake/Output from previous day: 06/25 0701 - 06/26 0700 In: 3491.7 [P.O.:360; I.V.:2831.7; IV Piggyback:300] Out: 1105 [Urine:775; Drains:280; Blood:50]   Recent Labs  06/24/17 0540  K 4.6  CL 110  CO2 23  BUN 15  CREATININE 1.04*  GLUCOSE 145*  CALCIUM 8.2*   CBC still pending.  EXAM General: Well-developed well-nourished female seen in no acute distress. Lungs: clear to auscultation Cardiac: normal rate, regular rhythm, normal S1, S2, no murmurs, rubs, clicks or gallops Right lower extremity: Knee dressing is dry and intact. Homans test is negative. The patient is able to perform a straight leg raise with minimal assistance. Bone foam is in place with the knee fully extended. Neurologic: Awake, alert, and oriented. Sensory and motor function are grossly intact.  Assessment: Right total knee arthroplasty  Secondary diagnoses: Sarcoidosis Peripheral vascular disease Obstructive sleep apnea Neuropathy Hypertension Gastroesophageal reflux disease Coronary artery disease Aortic atherosclerosis Anxiety  Plan: Today's goal were reviewed with the patient.  Begin physical therapy and occupational therapy as per total knee  arthroplasty rehabilitation protocol. Plan is to go Skilled nursing facility after hospital stay. DVT Prophylaxis - Lovenox, Foot Pumps and TED hose  James P. Angie FavaHooten, Jr. M.D.

## 2017-06-24 NOTE — Anesthesia Postprocedure Evaluation (Signed)
Anesthesia Post Note  [name]  Procedure(s) Performed: Procedure(s) (LRB): COMPUTER ASSISTED TOTAL KNEE ARTHROPLASTY (Right)  Patient location during evaluation: Nursing Unit Anesthesia Type: Spinal Level of consciousness: awake, awake and alert and oriented Pain management: pain level controlled Vital Signs Assessment: post-procedure vital signs reviewed and stable Respiratory status: spontaneous breathing, nonlabored ventilation and respiratory function stable Cardiovascular status: stable Anesthetic complications: no     Last Vitals:  Vitals:   06/23/17 2311 06/24/17 0427  BP: 109/73 129/61  Pulse: (!) 59 68  Resp: 16 18  Temp: 36.9 C 36.7 C    Last Pain:  Vitals:   06/24/17 0427  TempSrc: Oral  PainSc:                  Casey Burkitthuy Menachem Urbanek

## 2017-06-24 NOTE — Care Management Note (Signed)
Case Management Note  Patient Details  Name: Sheila Long MRN: [id number] Date of Birth: 03/24/1924  Subjective/Objective:  RNCM consult for discharge planning. PT recommending SNF. [facility]. RNCM to sign off at this time.                  Action/Plan:   Expected Discharge Date:                  Expected Discharge Plan:     In-House Referral:     Discharge planning Services  CM Consult  Post Acute Care Choice:    Choice offered to:     DME Arranged:    DME Agency:     HH Arranged:    HH Agency:     Status of Service:  In process, will continue to follow  If discussed at Long Length of Stay Meetings, dates discussed:    Additional Comments:  Marily MemosLisa M Timofey Carandang, RN 06/24/2017, 2:53 PM

## 2017-06-24 NOTE — Discharge Summary (Signed)
Physician Discharge Summary  Patient ID: Sheila Long MRN: [id number] DOB/AGE: 1924-04-20 83 y.o.  Admit date: 06/23/2017 Discharge date: 06/25/2017  Admission Diagnoses:  primary osteoarthritis of right knee   Discharge Diagnoses: Patient Active Problem List   Diagnosis Date Noted  . S/P total knee arthroplasty 06/23/2017  . Pulmonary hypertension associated with sarcoidosis (HCC) 02/03/2017  . Dyspnea on exertion 11/28/2016  . Lower extremity edema 11/28/2016  . Breast pain, left 10/15/2016  . Thigh pain 10/15/2016  . Familial tremor 09/24/2016  . Sarcoidosis   . Hypertension   . Allergic rhinitis due to pollen   . Coronary artery disease   . Aortic atherosclerosis (HCC)   . GERD (gastroesophageal reflux disease)   . Peripheral vascular disease (HCC)   . Osteoarthritis of both knees   . Obstructive sleep apnea   . Neuropathy     Past Medical History:  Diagnosis Date  . Allergic rhinitis due to pollen   . Anxiety   . Aortic atherosclerosis (HCC)   . Coronary artery disease    Stent in 2000's (DC)  . Dyspnea   . GERD (gastroesophageal reflux disease)   . Hard of hearing   . Hypertension   . Neuropathy   . Obstructive sleep apnea   . Osteoarthritis of both knees   . Peripheral vascular disease (HCC)   . Sarcoidosis   . Thyroid nodule      Transfusion: No transfusions during this admission   Consultants (if any):   Discharged Condition: Improved  Hospital Course: Sheila Long is an 82 y.o. female who was admitted 06/23/2017 with a diagnosis of degenerative arthrosis right knee and went to the operating room on 06/23/2017 and underwent the above named procedures.    Surgeries:Procedure(s): COMPUTER ASSISTED TOTAL KNEE ARTHROPLASTY on 06/23/2017  PRE-OPERATIVE DIAGNOSIS: Degenerative arthrosis of the right knee, primary  POST-OPERATIVE DIAGNOSIS:  Same  PROCEDURE:  Right total knee arthroplasty using computer-assisted  navigation  SURGEON:  Jena Gauss. M.D.  ASSISTANT:  Van Clines, PA (present and scrubbed throughout the case, critical for assistance with exposure, retraction, instrumentation, and closure)  ANESTHESIA: spinal  ESTIMATED BLOOD LOSS: 50 mL  FLUIDS REPLACED: 1400 mL of crystalloid  TOURNIQUET TIME: 84 minutes  DRAINS: 2 medium Hemovac drains  SOFT TISSUE RELEASES: Anterior cruciate ligament, posterior cruciate ligament, deep and superficial medial collateral ligament, patellofemoral ligament  IMPLANTS UTILIZED: DePuy Attune size 6 posterior stabilized femoral component (cemented), size 6 rotating platform tibial component (cemented), 35 mm medialized dome patella (cemented), and a 5 mm stabilized rotating platform polyethylene insert.  INDICATIONS FOR SURGERY: Sheila Long is a 83 y.o. year old female with a long history of progressive knee pain. X-rays demonstrated severe degenerative changes in tricompartmental fashion. The patient had not seen any significant improvement despite conservative nonsurgical intervention. After discussion of the risks and benefits of surgical intervention, the patient expressed understanding of the risks benefits and agree with plans for total knee arthroplasty.   The risks, benefits, and alternatives were discussed at length including but not limited to the risks of infection, bleeding, nerve injury, stiffness, blood clots, the need for revision surgery, cardiopulmonary complications, among others, and they were willing to proceed Patient tolerated the surgery well. No complications .Patient was taken to PACU where she was stabilized and then transferred to the orthopedic floor.  Patient started on Lovenox 30 mg q 12 hrs. Foot pumps applied bilaterally at 80 mm hgb. Heels elevated off bed with rolled towels. No evidence of DVT.  Calves non tender. Negative Homan. Physical therapy started on day #1 for gait training and transfer with  OT starting on  day #1 for ADL and assisted devices. Patient has done well with therapy. Ambulated 50 feet upon being discharged.  Patient's IV And Foley were discontinued on day #1 with Hemovac being discontinued on day #2. Dressing was changed on day 2 prior to patient being discharged   She was given perioperative antibiotics:  Anti-infectives    Start     Dose/Rate Route Frequency Ordered Stop   06/23/17 1700  ceFAZolin (ANCEF) IVPB 2g/100 mL premix     2 g 200 mL/hr over 30 Minutes Intravenous Every 6 hours 06/23/17 1530 06/24/17 1659   06/23/17 0930  ceFAZolin (ANCEF) 2-4 GM/100ML-% IVPB    Comments:  Hallaji, Violet Ann: cabinet override      06/23/17 0930 06/23/17 2129   06/23/17 0600  ceFAZolin (ANCEF) IVPB 2g/100 mL premix  Status:  Discontinued     2 g 200 mL/hr over 30 Minutes Intravenous On call to O.R. 06/22/17 2303 06/23/17 [id number]    .  She was fitted with AV 1 compression foot pump devices, instructed on heel pumps, early ambulation, and fitted with TED stockings bilaterally for DVT prophylaxis.  She benefited maximally from the hospital stay and there were no complications.    Recent vital signs:  Vitals:   06/23/17 2311 06/24/17 0427  BP: 109/73 129/61  Pulse: (!) 59 68  Resp: 16 18  Temp: 98.5 F (36.9 C) 98.1 F (36.7 C)    Recent laboratory studies:  Lab Results  Component Value Date   HGB 11.6 (L) 06/24/2017   HGB 14.3 06/11/2017   HGB 14.9 04/03/2017   Lab Results  Component Value Date   WBC 9.1 06/24/2017   PLT 155 06/24/2017   Lab Results  Component Value Date   INR 0.95 06/11/2017   Lab Results  Component Value Date   NA 136 06/24/2017   K 4.6 06/24/2017   CL 110 06/24/2017   CO2 23 06/24/2017   BUN 15 06/24/2017   CREATININE 1.04 (H) 06/24/2017   GLUCOSE 145 (H) 06/24/2017    Discharge Medications:   Allergies as of 06/25/2017      Reactions   Tramadol Other (See Comments)   CONFUSION   Sulfa Antibiotics    Pt dont remember  reaction   Zocor [simvastatin] Other (See Comments)   INTOLERANCE-MYALGIAS      Medication List    STOP taking these medications   aspirin 81 MG tablet     TAKE these medications   acetaminophen 650 MG CR tablet Commonly known as:  TYLENOL Take 650 mg by mouth every 8 (eight) hours as needed for pain.   acetaminophen-codeine 300-30 MG tablet Commonly known as:  TYLENOL #3 Take 1 tablet by mouth 3 (three) times daily as needed for moderate pain.   albuterol 108 (90 Base) MCG/ACT inhaler Commonly known as:  PROVENTIL HFA Inhale 2 puffs into the lungs every 4 (four) hours as needed for wheezing or shortness of breath.   amLODipine 5 MG tablet Commonly known as:  NORVASC TAKE 1 TABLET (5 MG TOTAL) BY MOUTH DAILY.   enoxaparin 30 MG/0.3ML injection Commonly known as:  LOVENOX Inject 0.3 mLs (30 mg total) into the skin every 12 (twelve) hours.   furosemide 20 MG tablet Commonly known as:  LASIX Take 20 mg by mouth 2 (two) times a week.   latanoprost 0.005 % ophthalmic solution  Commonly known as:  XALATAN Place 1 drop into both eyes at bedtime.   LORazepam 0.5 MG tablet Commonly known as:  ATIVAN Take 1 tablet (0.5 mg total) by mouth at bedtime as needed for anxiety.   metoprolol succinate 50 MG 24 hr tablet Commonly known as:  TOPROL-XL Take 100 mg by mouth daily. Take with or immediately following a meal.   pantoprazole 40 MG tablet Commonly known as:  PROTONIX Take 40 mg by mouth daily.   ranitidine 150 MG tablet Commonly known as:  ZANTAC Take 150 mg by mouth 2 (two) times daily as needed for heartburn.   sertraline 25 MG tablet Commonly known as:  ZOLOFT Take 25 mg by mouth daily as needed (stress).   traMADol 50 MG tablet Commonly known as:  ULTRAM Take 1-2 tablets (50-100 mg total) by mouth every 4 (four) hours as needed for moderate pain or severe pain.            Durable Medical Equipment        Start     Ordered   06/23/17 1531  DME Walker  rolling  Once    Question:  Patient needs a walker to treat with the following condition  Answer:  Total knee replacement status   06/23/17 1530   06/23/17 1531  DME Bedside commode  Once    Question:  Patient needs a bedside commode to treat with the following condition  Answer:  Total knee replacement status   06/23/17 1530      Diagnostic Studies: Dg Knee Right Port  Result Date: 06/23/2017 CLINICAL DATA:  Right total knee replacement EXAM: PORTABLE RIGHT KNEE - 1-2 VIEW COMPARISON:  None. FINDINGS: The right knee demonstrates a total knee arthroplasty without evidence of hardware failure complication. There is no significant joint effusion. There is no fracture or dislocation. The alignment is anatomic. Surgical drains are present. Post-surgical changes noted in the surrounding soft tissues. Peripheral vascular atherosclerotic disease. IMPRESSION: Right total knee arthroplasty without failure or complication. Electronically Signed   By: Elige Ko   On: 06/23/2017 14:50    Disposition: 01-Home or Self Care    Follow-up Information    Tera Partridge, PA On 07/08/2017.   Specialty:  Physician Assistant Why:  at 1:45pm Contact information: [address] [facility] [place] [address] [phone]        Donato Heinz, MD On 08/05/2017.   Specialty:  Orthopedic Surgery Why:  at 2:15pm Contact information: [address] [facility] [address] [facility] [place] [address] [phone]            Signed: Tera Partridge 06/24/2017, 7:27 AM

## 2017-06-24 NOTE — Evaluation (Signed)
Physical Therapy Evaluation Patient Details Name: Sheila LampKathelene Brittian MRN: [id number] DOB: 04-06-1924 Today's Date: 06/24/2017   History of Present Illness  83 y/o female s/p R TKA 6/25.  Clinical Impression  Pt was very pleasant and motivated t/o the session, she is [facility] making some communication difficult.  Overall pt did very well POD1 TKA.  She needed light assist to get up to sitting and standing and a lot of cuing to feel comfortable with ambulation but with gait training and encouragement she was able to walk ~35 ft with slow/hesistant but safe cadence.  She did well with ~15 minutes of supine bed exercises with focus on R LE strengthening and ROM and ultimately she did well.     Follow Up Recommendations SNF    Equipment Recommendations       Recommendations for Other Services       Precautions / Restrictions Precautions Precautions: Knee;Fall Restrictions Weight Bearing Restrictions: No      Mobility  Bed Mobility Overal bed mobility: Needs Assistance Bed Mobility: Supine to Sit     Supine to sit: Min assist     General bed mobility comments: Pt needed just light assist to get LEs to EOB and get torso upright in sitting  Transfers Overall transfer level: Needs assistance Equipment used: Rolling walker (2 wheeled) Transfers: Sit to/from Stand Sit to Stand: Min assist         General transfer comment: cuing for set up (hand/foot placement) and light assist to get to upright sitting but overall pt did relatively well   Ambulation/Gait Ambulation/Gait assistance: Min assist Ambulation Distance (Feet): 35 Feet Assistive device: Rolling walker (2 wheeled)       General Gait Details: Pt with slow, deliberate ambulation, but was safe and did not need more than CGA and cuing for safety.   Stairs            Wheelchair Mobility    Modified Rankin (Stroke Patients Only)       Balance                                              Pertinent Vitals/Pain Pain Assessment: 0-10 Pain Score: 4     Home Living Family/patient expects to be discharged to:: Skilled nursing facility Living Arrangements: Alone                    Prior Function Level of Independence: Independent               Hand Dominance        Extremity/Trunk Assessment   Upper Extremity Assessment Upper Extremity Assessment: Generalized weakness;Overall WFL for tasks assessed (age appropriate limitations)    Lower Extremity Assessment Lower Extremity Assessment: Generalized weakness (age appropriate limitations)       Communication   Communication: HOH  Cognition Arousal/Alertness: Awake/alert Behavior During Therapy: WFL for tasks assessed/performed Overall Cognitive Status: Within Functional Limits for tasks assessed                                        General Comments      Exercises Total Joint Exercises Ankle Circles/Pumps: Strengthening;10 reps Quad Sets: Strengthening;10 reps Gluteal Sets: Strengthening;10 reps Heel Slides: AROM;10 reps Hip ABduction/ADduction: Strengthening;10 reps;AROM Straight Leg Raises: AROM;AAROM;10  reps (after AAROM warm up pt was able to do 10 AROM SLRs) Knee Flexion: PROM;5 reps Goniometric ROM: 0-76   Assessment/Plan    PT Assessment Patient needs continued PT services  PT Problem List Decreased strength;Decreased balance;Decreased range of motion;Decreased activity tolerance;Decreased mobility;Decreased knowledge of use of DME;Decreased safety awareness;Decreased knowledge of precautions;Pain       PT Treatment Interventions DME instruction;Gait training;Stair training;Functional mobility training;Therapeutic activities;Therapeutic exercise;Balance training;Cognitive remediation;Patient/family education    PT Goals (Current goals can be found in the Care Plan section)  Acute Rehab PT Goals Patient Stated Goal: get strong enough to go back home alone PT  Goal Formulation: With patient Time For Goal Achievement: 07/08/17 Potential to Achieve Goals: Good    Frequency BID   Barriers to discharge        Co-evaluation               AM-PAC PT "6 Clicks" Daily Activity  Outcome Measure Difficulty turning over in bed (including adjusting bedclothes, sheets and blankets)?: Total Difficulty moving from lying on back to sitting on the side of the bed? : Total Difficulty sitting down on and standing up from a chair with arms (e.g., wheelchair, bedside commode, etc,.)?: Total Help needed moving to and from a bed to chair (including a wheelchair)?: A Little Help needed walking in hospital room?: A Little Help needed climbing 3-5 steps with a railing? : A Lot 6 Click Score: 11    End of Session Equipment Utilized During Treatment: Gait belt Activity Tolerance: Patient limited by pain Patient left: with chair alarm set;with call bell/phone within reach Nurse Communication: Mobility status PT Visit Diagnosis: Muscle weakness (generalized) (M62.81);Difficulty in walking, not elsewhere classified (R26.2)    Time: 1610-9604 PT Time Calculation (min) (ACUTE ONLY): 43 min   Charges:   PT Evaluation $PT Eval Low Complexity: 1 Procedure PT Treatments $Gait Training: 8-22 mins $Therapeutic Exercise: 8-22 mins   PT G Codes:        Malachi Pro, DPT 06/24/2017, 12:05 PM

## 2017-06-24 NOTE — Discharge Instructions (Signed)

## 2017-06-24 NOTE — Clinical Social Work Placement (Signed)
   CLINICAL SOCIAL WORK PLACEMENT  NOTE  Date:  06/24/2017  Patient Details  Name: Sheila Long MRN: [id number] Date of Birth: 1924/10/06  Clinical Social Work is seeking post-discharge placement for this patient at the Skilled  Nursing Facility level of care (*CSW will initial, date and re-position this form in  chart as items are completed):  Yes   Patient/family provided with [place] Clinical Social Work Department's list of facilities offering this level of care within the geographic area requested by the patient (or if unable, by the patient's family).  Yes   Patient/family informed of their freedom to choose among providers that offer the needed level of care, that participate in Medicare, Medicaid or managed care program needed by the patient, have an available bed and are willing to accept the patient.  Yes   Patient/family informed of [facility]'s ownership interest [facility] and [facility], as well as of the fact that they are under no obligation to receive care at these facilities.  PASRR submitted to EDS on       PASRR number received on       Existing PASRR number confirmed on 06/24/17     FL2 transmitted to all facilities in geographic area requested by pt/family on 06/24/17     FL2 transmitted to all facilities within larger geographic area on       Patient informed that his/her managed care company has contracts with or will negotiate with certain facilities, including the following:            Patient/family informed of bed offers received.  Patient chooses bed at       Physician recommends and patient chooses bed at      Patient to be transferred to   on  .  Patient to be transferred to facility by       Patient family notified on   of transfer.  Name of family member notified:        PHYSICIAN       Additional Comment:    _______________________________________________ [name], Darleen CrockerBailey M, LCSW 06/24/2017, 1:39 PM

## 2017-06-25 DIAGNOSIS — H409 Unspecified glaucoma: Secondary | ICD-10-CM | POA: Diagnosis not present

## 2017-06-25 DIAGNOSIS — K219 Gastro-esophageal reflux disease without esophagitis: Secondary | ICD-10-CM | POA: Diagnosis not present

## 2017-06-25 DIAGNOSIS — M6281 Muscle weakness (generalized): Secondary | ICD-10-CM | POA: Diagnosis not present

## 2017-06-25 DIAGNOSIS — M25561 Pain in right knee: Secondary | ICD-10-CM | POA: Diagnosis not present

## 2017-06-25 DIAGNOSIS — Z7401 Bed confinement status: Secondary | ICD-10-CM | POA: Diagnosis not present

## 2017-06-25 DIAGNOSIS — D869 Sarcoidosis, unspecified: Secondary | ICD-10-CM | POA: Diagnosis not present

## 2017-06-25 DIAGNOSIS — I10 Essential (primary) hypertension: Secondary | ICD-10-CM | POA: Diagnosis not present

## 2017-06-25 DIAGNOSIS — Z96651 Presence of right artificial knee joint: Secondary | ICD-10-CM | POA: Diagnosis not present

## 2017-06-25 DIAGNOSIS — I2510 Atherosclerotic heart disease of native coronary artery without angina pectoris: Secondary | ICD-10-CM | POA: Diagnosis not present

## 2017-06-25 DIAGNOSIS — I2729 Other secondary pulmonary hypertension: Secondary | ICD-10-CM | POA: Diagnosis not present

## 2017-06-25 DIAGNOSIS — G4733 Obstructive sleep apnea (adult) (pediatric): Secondary | ICD-10-CM | POA: Diagnosis not present

## 2017-06-25 DIAGNOSIS — R262 Difficulty in walking, not elsewhere classified: Secondary | ICD-10-CM | POA: Diagnosis not present

## 2017-06-25 DIAGNOSIS — Z471 Aftercare following joint replacement surgery: Secondary | ICD-10-CM | POA: Diagnosis not present

## 2017-06-25 LAB — BASIC METABOLIC PANEL
Anion gap: 2 — ABNORMAL LOW (ref 5–15)
BUN: 15 mg/dL (ref 6–20)
CO2: 27 mmol/L (ref 22–32)
Calcium: 8.4 mg/dL — ABNORMAL LOW (ref 8.9–10.3)
Chloride: 106 mmol/L (ref 101–111)
Creatinine, Ser: 0.84 mg/dL (ref 0.44–1.00)
GFR calc Af Amer: >60 mL/min (ref >60)
GFR calc non Af Amer: 59 mL/min — ABNORMAL LOW (ref >60)
Glucose, Bld: 141 mg/dL — ABNORMAL HIGH (ref 65–99)
Potassium: 4.3 mmol/L (ref 3.5–5.1)
Sodium: 135 mmol/L (ref 135–145)

## 2017-06-25 LAB — CBC
HCT: 34 % — ABNORMAL LOW (ref 35.0–47.0)
Hemoglobin: 11.6 g/dL — ABNORMAL LOW (ref 12.0–16.0)
MCH: 33.9 pg (ref 26.0–34.0)
MCHC: 34.2 g/dL (ref 32.0–36.0)
MCV: 98.9 fL (ref 80.0–100.0)
Platelets: 146 10*3/uL — ABNORMAL LOW (ref 150–440)
RBC: 3.44 MIL/uL — ABNORMAL LOW (ref 3.80–5.20)
RDW: 14.7 % — ABNORMAL HIGH (ref 11.5–14.5)
WBC: 11.1 10*3/uL — ABNORMAL HIGH (ref 3.6–11.0)

## 2017-06-25 MED ORDER — TRAMADOL HCL 50 MG PO TABS: 50.0000 mg | ORAL_TABLET | ORAL | 0 refills | Status: DC | PRN

## 2017-06-25 MED ORDER — LACTULOSE 10 GM/15ML PO SOLN
10.0000 g | Freq: Two times a day (BID) | ORAL | Status: DC | PRN
  Administered 2017-06-25: 10 g via ORAL
  Filled 2017-06-25: qty 30

## 2017-06-25 MED ORDER — ENOXAPARIN SODIUM 30 MG/0.3ML ~~LOC~~ SOLN: 30.0000 mg | Freq: Two times a day (BID) | SUBCUTANEOUS | 0 refills | Status: DC

## 2017-06-25 MED ORDER — MAGNESIUM CITRATE PO SOLN
1.0000 | Freq: Once | ORAL | Status: AC
  Administered 2017-06-25: 1 via ORAL
  Filled 2017-06-25: qty 296

## 2017-06-25 NOTE — Progress Notes (Signed)
   Subjective: 2 Days Post-Op Procedure(s) (LRB): COMPUTER ASSISTED TOTAL KNEE ARTHROPLASTY (Right) Patient reports pain as moderate.  However patient states that she had 10 out 10 last evening after therapy. States that she did not take any pain medicine prior to having therapy. Patient is well, and has had no acute complaints or problems Patient did well with therapy yesterday and walked 35 and 50 feet. Range of motion 0-76 Plan is to go Rehab after hospital stay. no nausea and no vomiting. Was noted to have some nausea yesterday Patient denies any chest pains or shortness of breath. Objective: Vital signs in last 24 hours: Temp:  [98.2 F (36.8 C)] 98.2 F (36.8 C) (06/26 2300) Pulse Rate:  [72-74] 72 (06/27 0017) Resp:  [18-20] 20 (06/27 0017) BP: (132-150)/(60-63) 150/63 (06/27 0017) SpO2:  [97 %-100 %] 97 % (06/27 0017) well approximated incision Heels are non tender and elevated off the bed using rolled towels Intake/Output from previous day: 06/26 0701 - 06/27 0700 In: 1480 [P.O.:480; I.V.:900; IV Piggyback:100] Out: 160 [Drains:160] Intake/Output this shift: No intake/output data recorded.   Recent Labs  06/24/17 0635 06/25/17 0621  HGB 11.6* 11.6*    Recent Labs  06/24/17 0635 06/25/17 0621  WBC 9.1 11.1*  RBC 3.42* 3.44*  HCT 33.5* 34.0*  PLT 155 146*    Recent Labs  06/24/17 0540 06/25/17 0621  NA 136 135  K 4.6 4.3  CL 110 106  CO2 23 27  BUN 15 15  CREATININE 1.04* 0.84  GLUCOSE 145* 141*  CALCIUM 8.2* 8.4*   No results for input(s): LABPT, INR in the last 72 hours.  EXAM General - Patient is Alert, Appropriate and Oriented Extremity - Neurologically intact Neurovascular intact Sensation intact distally Intact pulses distally Dorsiflexion/Plantar flexion intact No cellulitis present Compartment soft Dressing - dressing C/D/I Motor Function - intact, moving foot and toes well on exam.    Past Medical History:  Diagnosis Date  .  Allergic rhinitis due to pollen   . Anxiety   . Aortic atherosclerosis (HCC)   . Coronary artery disease    Stent in 2000's (DC)  . Dyspnea   . GERD (gastroesophageal reflux disease)   . Hard of hearing   . Hypertension   . Neuropathy   . Obstructive sleep apnea   . Osteoarthritis of both knees   . Peripheral vascular disease (HCC)   . Sarcoidosis   . Thyroid nodule     Assessment/Plan: 2 Days Post-Op Procedure(s) (LRB): COMPUTER ASSISTED TOTAL KNEE ARTHROPLASTY (Right) Active Problems:   S/P total knee arthroplasty  Estimated body mass index is 37.08 kg/m as calculated from the following:   Height as of this encounter: 5\' 4"  (1.626 m).   Weight as of this encounter: 98 kg (216 lb). Up with therapy Discharge to SNF  Labs: Were reviewed. DVT Prophylaxis - Lovenox, Foot Pumps and TED hose Weight-Bearing as tolerated to right leg Patient needs a bowel movement today. Lactulose ordered. Patient may be discharged to rehabilitation once she has a bowel movement.  Lynnda ShieldsJon R. John & Mary Kirby HospitalWolfe PA [facility] 06/25/2017, 7:02 AM

## 2017-06-25 NOTE — Progress Notes (Signed)
Pt alert and oriented. Surgical dressing dry and intact. Medicated for pain with good results. Pt able to sleep in between care. Tolerating bone foam without difficulty. Up to bedside commode to void.

## 2017-06-25 NOTE — Progress Notes (Signed)
Physical Therapy Treatment Patient Details Name: Sheila LampKathelene Long MRN: [id number] DOB: [birth year] Today's Date: 06/25/2017    History of Present Illness 83 y/o female s/p R TKA, WBAT 6/25.    PT Comments    Progressive increase in gait distance (120') with RW, cga; tolerating increased R knee ROM (2-90 degrees).  Consistent cuing for hand placement, safety awareness and overall speed of movement to maximize safety/indep.   Excellent effort and participation; anticipate steady progress towards all mobility goals with consistent post-acute rehab.   Follow Up Recommendations  SNF     Equipment Recommendations       Recommendations for Other Services       Precautions / Restrictions Precautions Precautions: Knee;Fall Restrictions Weight Bearing Restrictions: Yes RLE Weight Bearing: Weight bearing as tolerated    Mobility  Bed Mobility Overal bed mobility: Needs Assistance Bed Mobility: Supine to Sit     Supine to sit: Min assist     General bed mobility comments: assist for LE management over edge of bed  Transfers Overall transfer level: Needs assistance Equipment used: Rolling walker (2 wheeled) Transfers: Sit to/from Stand Sit to Stand: Min guard         General transfer comment: cuing for hand placement, excessive weight shift to L LE (R LE slightly anterior to BOS).  Increased time to complete  Ambulation/Gait Ambulation/Gait assistance: Min guard Ambulation Distance (Feet): 120 Feet Assistive device: Rolling walker (2 wheeled)       General Gait Details: reciprocal stepping pattern with cuing for increased cadence, gait speed throughout distance.  Fair stance time/weight acceptance R LE without buckling or LOB.   Stairs            Wheelchair Mobility    Modified Rankin (Stroke Patients Only)       Balance Overall balance assessment: Needs assistance Sitting-balance support: No upper extremity supported;Feet supported Sitting  balance-Leahy Scale: Good     Standing balance support: Bilateral upper extremity supported Standing balance-Leahy Scale: Fair                              Cognition Arousal/Alertness: Awake/alert Behavior During Therapy: WFL for tasks assessed/performed Overall Cognitive Status: Within Functional Limits for tasks assessed                                        Exercises Total Joint Exercises Goniometric ROM: 2-90 degrees Other Exercises Other Exercises: Seated LE therex, 1x15, AROM for muscular strength/endurance:  ankle pumps, LAQs with knee flexion, marching. Other Exercises: Toilet transfer, ambulatory with RW, cga; [facility] with [facility]W, cga.  Increased safety/ease of transfer wtih elevated surface heigth and armrests.  Standing balance for clothing management, hygiene, cga/min assist-cuing to maintain single UE support on RW for optimal safety/balance throughout.    General Comments        Pertinent Vitals/Pain Pain Assessment: 0-10 Pain Score: 8  Pain Location: with gait/WBing Pain Descriptors / Indicators: Aching;Grimacing;Operative site guarding Pain Intervention(s): Limited activity within patient's tolerance;Premedicated before session;Monitored during session;Repositioned    Home Living                      Prior Function            PT Goals (current goals can now be found in the care plan section) Acute Rehab PT  Goals Patient Stated Goal: get stronger PT Goal Formulation: With patient Time For Goal Achievement: 07/08/17 Potential to Achieve Goals: Good Progress towards PT goals: Progressing toward goals    Frequency    BID      PT Plan Current plan remains appropriate    Co-evaluation              AM-PAC PT "6 Clicks" Daily Activity  Outcome Measure  Difficulty turning over in bed (including adjusting bedclothes, sheets and blankets)?: Total Difficulty moving from lying on back to sitting on the  side of the bed? : Total Difficulty sitting down on and standing up from a chair with arms (e.g., wheelchair, bedside commode, etc,.)?: A Little Help needed moving to and from a bed to chair (including a wheelchair)?: A Little Help needed walking in hospital room?: A Little Help needed climbing 3-5 steps with a railing? : A Lot 6 Click Score: 13    End of Session Equipment Utilized During Treatment: Gait belt Activity Tolerance: Patient tolerated treatment well Patient left: in chair;with call bell/phone within reach;with chair alarm set Nurse Communication: Mobility status PT Visit Diagnosis: Muscle weakness (generalized) (M62.81);Difficulty in walking, not elsewhere classified (R26.2)     Time: 1610-9604 PT Time Calculation (min) (ACUTE ONLY): 40 min  Charges:  $Gait Training: 8-22 mins $Therapeutic Exercise: 8-22 mins $Therapeutic Activity: 8-22 mins                    G Codes:       Sheila Long, PT, DPT, NCS 06/25/17, 10:28 AM [phone]

## 2017-06-25 NOTE — Progress Notes (Signed)
Shift assessment completed. Pt is awake, alert and oriented, told this writer that she was a little uneasy about being discharged to snf today. This Clinical research associatewriter discussed care that pt could expect at the rehab facility and pt stated that she felt it would be ok. Pt made aware that she needed to have stool in order to be dc'd. Pt is alert and oriented, on room air, lungs are clear bilat. Pt is very hoh, hearing aids are placed bilat and hearing improved a little. Abdomen is soft, bs hypoactive. piv #20 intact to l fa, site is free of redness and swelling. r knee has dressing and polar care intact.ppp, no edema noted. Pt denied pain while lying in bed. Since assessment, pt has had all medication available for bm with no result. This Clinical research associatewriter called Leda GauzeJohn Wolfe,PA and received order for magnesium citrate, and then repeat lactulose if no result. Report given to amy, Rn at 1515 and care of pt was released. Pt did have a stool after this. This Clinical research associatewriter called [facility], [place] and ems was called as well,.

## 2017-06-25 NOTE — Progress Notes (Signed)
Patient is medically stable for [facility]. [facility] authorization has been received, auth # [id number]. [facility] today to room 203[facility]B. RN will call report at [phone] and arrange EMS for transport. Clinical Child psychotherapistocial Worker (CSW) sent [facility] orders [facility] via [employer]. Patient is aware of above. CSW contacted patient's sister Evonnie Patlgene Pool and made her aware of above. Please reconsult if future social work needs arise. CSW signing off.   Baker Hughes IncorporatedBailey Lior Cartelli, LCSW [phone]) [phone]

## 2017-06-25 NOTE — Clinical Social Work Placement (Signed)
   CLINICAL SOCIAL WORK PLACEMENT  NOTE  Date:  06/25/2017  Patient Details  Name: Sheila Long MRN: [id number] Date of Birth: 1924-03-05  Clinical Social Work is seeking post-discharge placement for this patient at the Skilled  Nursing Facility level of care (*CSW will initial, date and re-position this form in  chart as items are completed):  Yes   Patient/family provided with [place] Clinical Social Work Department's list of facilities offering this level of care within the geographic area requested by the patient (or if unable, by the patient's family).  Yes   Patient/family informed of their freedom to choose among providers that offer the needed level of care, that participate in Medicare, Medicaid or managed care program needed by the patient, have an available bed and are willing to accept the patient.  Yes   Patient/family informed of [place]'s ownership interest in [facility] and [facility], as well as of the fact that they are under no obligation to receive care at these facilities.  PASRR submitted to EDS on       PASRR number received on       Existing PASRR number confirmed on 06/24/17     FL2 transmitted to all facilities in geographic area requested by pt/family on 06/24/17     FL2 transmitted to all facilities within larger geographic area on       Patient informed that his/her managed care company has contracts with or will negotiate with certain facilities, including the following:        Yes   Patient/family informed of bed offers received.  Patient chooses bed at  [facility]     Physician recommends and patient chooses bed at      Patient to be transferred to  [facility] ) on 06/25/17.  Patient to be transferred to facility by  [facility] )     Patient family notified on 06/25/17 of transfer.  Name of family member notified:   (Patient's sister Evonnie Patlgene Pool is aware of D/C today. )     PHYSICIAN        Additional Comment:    _______________________________________________ Ercil Cassis, Darleen CrockerBailey M, LCSW 06/25/2017, 12:37 PM

## 2017-06-25 NOTE — Progress Notes (Signed)
Occupational Therapy Treatment Patient Details Name: Sheila Long MRN: [id number] DOB: [birth year] Today's Date: 06/25/2017    History of present illness 83 y/o female s/p R TKA, WBAT 6/25.   OT comments  Pt seen for treatment session this date. Pt able to demo technique for donning LB dressing with AE with min assist to adjust over feet. Pt performed toilet transfer to [facility] in room using RW for ambulation with min guard and verbal cues for hand placement to maximize safety. Pt progressing towards goals. Pt had small BM, RN notified in preparation for discharge to [facility].    Follow Up Recommendations  SNF    Equipment Recommendations  None recommended by OT    Recommendations for Other Services      Precautions / Restrictions Precautions Precautions: Knee;Fall Restrictions Weight Bearing Restrictions: Yes RLE Weight Bearing: Weight bearing as tolerated       Mobility Bed Mobility               General bed mobility comments: deferred d/t pt in recliner for session  Transfers Overall transfer level: Needs assistance Equipment used: Rolling walker (2 wheeled) Transfers: Sit to/from Stand Sit to Stand: Min guard;Min assist         General transfer comment: initial min guard to min assist for initial transfer, min guard and additional effort/time for rest of transfers    Balance Overall balance assessment: Needs assistance Sitting-balance support: No upper extremity supported;Feet supported Sitting balance-Leahy Scale: Good     Standing balance support: During functional activity;No upper extremity supported Standing balance-Leahy Scale: Fair Standing balance comment: while in standing, pt used both hands to don undergarments over hips                           ADL either performed or assessed with clinical judgement   ADL Overall ADL's : Needs assistance/impaired                     Lower Body Dressing: Sitting/lateral leans;Sit  to/from stand;Minimal assistance;Cueing for compensatory techniques;With adaptive equipment Lower Body Dressing Details (indicate cue type and reason): pt performed LB dressing with AE and review of education provided, able to demonstrate understanding and good technique with min assist and verbal cues for technique. Toilet Transfer: Ambulation;RW;BSC;Min Pension scheme manager Details (indicate cue type and reason): close min guard with additional time and effort to perform Toileting- Clothing Manipulation and Hygiene: Set up;Sitting/lateral lean;Min guard;Sit to/from stand Toileting - Clothing Manipulation Details (indicate cue type and reason): set up for hygiene with lateral lean to perform, min guard during sit to stand transfer before completing donning of undergarments over hips with verbal cues for hand placement             Vision Baseline Vision/History: Wears glasses Wears Glasses: At all times Patient Visual Report: No change from baseline Vision Assessment?: No apparent visual deficits   Perception     Praxis      Cognition Arousal/Alertness: Awake/alert Behavior During Therapy: WFL for tasks assessed/performed Overall Cognitive Status: Within Functional Limits for tasks assessed                                          Exercises     Shoulder Instructions       General Comments      Pertinent Vitals/ Pain  Pain Assessment: Faces Pain Descriptors / Indicators: Grimacing Pain Intervention(s): Limited activity within patient's tolerance;Monitored during session;Repositioned;Ice applied  Home Living                                          Prior Functioning/Environment              Frequency  Min 1X/week        Progress Toward Goals  OT Goals(current goals can now be found in the care plan section)  Progress towards OT goals: Progressing toward goals  Acute Rehab OT Goals Patient Stated Goal: get  stronger OT Goal Formulation: With patient Time For Goal Achievement: 07/08/17 Potential to Achieve Goals: Good  Plan Discharge plan remains appropriate;Frequency remains appropriate    Co-evaluation                 AM-PAC PT "6 Clicks" Daily Activity     Outcome Measure   Help from another person eating meals?: None Help from another person taking care of personal grooming?: None Help from another person toileting, which includes using toliet, bedpan, or urinal?: A Little Help from another person bathing (including washing, rinsing, drying)?: A Little Help from another person to put on and taking off regular upper body clothing?: None Help from another person to put on and taking off regular lower body clothing?: A Little 6 Click Score: 21    End of Session Equipment Utilized During Treatment: Gait belt;Rolling walker      Activity Tolerance Patient tolerated treatment well   Patient Left in chair;with call bell/phone within reach;with chair alarm set;with nursing/sitter in room;Other (comment) (polar care reapplied)   Nurse Communication          Time: [phone] OT Time Calculation (min): 27 min  Charges: OT General Charges $OT Visit: 1 Procedure OT Treatments $Self Care/Home Management : 23-37 mins  Richrd PrimeJamie Long, MPH, MS, OTR/L ascom [phone] 06/25/17, 4:04 PM

## 2017-06-25 NOTE — Progress Notes (Signed)
Patient discharged to facility with EMS transport. Pt is in no distress. All belongings sent with patient.

## 2017-06-26 DIAGNOSIS — K5900 Constipation, unspecified: Secondary | ICD-10-CM | POA: Insufficient documentation

## 2017-06-26 DIAGNOSIS — I2510 Atherosclerotic heart disease of native coronary artery without angina pectoris: Secondary | ICD-10-CM | POA: Diagnosis not present

## 2017-06-26 DIAGNOSIS — M1711 Unilateral primary osteoarthritis, right knee: Secondary | ICD-10-CM | POA: Diagnosis not present

## 2017-06-26 DIAGNOSIS — K5903 Drug induced constipation: Secondary | ICD-10-CM | POA: Insufficient documentation

## 2017-06-26 DIAGNOSIS — I10 Essential (primary) hypertension: Secondary | ICD-10-CM | POA: Diagnosis not present

## 2017-06-29 ENCOUNTER — Encounter
Admission: RE | Admit: 2017-06-29 | Discharge: 2017-06-29 | Disposition: A | Discharge status: Home / Self Care | Payer: PPO | Source: Ambulatory Visit | Attending: Internal Medicine | Admitting: Internal Medicine

## 2017-06-29 DIAGNOSIS — R262 Difficulty in walking, not elsewhere classified: Secondary | ICD-10-CM | POA: Diagnosis not present

## 2017-06-29 DIAGNOSIS — I10 Essential (primary) hypertension: Secondary | ICD-10-CM | POA: Diagnosis not present

## 2017-06-29 DIAGNOSIS — Z96651 Presence of right artificial knee joint: Secondary | ICD-10-CM | POA: Diagnosis not present

## 2017-06-29 DIAGNOSIS — I2510 Atherosclerotic heart disease of native coronary artery without angina pectoris: Secondary | ICD-10-CM | POA: Diagnosis not present

## 2017-06-29 DIAGNOSIS — Z471 Aftercare following joint replacement surgery: Secondary | ICD-10-CM | POA: Diagnosis not present

## 2017-06-29 DIAGNOSIS — M1711 Unilateral primary osteoarthritis, right knee: Secondary | ICD-10-CM | POA: Diagnosis not present

## 2017-06-29 DIAGNOSIS — K219 Gastro-esophageal reflux disease without esophagitis: Secondary | ICD-10-CM | POA: Diagnosis not present

## 2017-06-29 DIAGNOSIS — H409 Unspecified glaucoma: Secondary | ICD-10-CM | POA: Diagnosis not present

## 2017-06-29 DIAGNOSIS — R05 Cough: Secondary | ICD-10-CM | POA: Diagnosis not present

## 2017-06-29 DIAGNOSIS — G4733 Obstructive sleep apnea (adult) (pediatric): Secondary | ICD-10-CM | POA: Diagnosis not present

## 2017-06-29 DIAGNOSIS — I2729 Other secondary pulmonary hypertension: Secondary | ICD-10-CM | POA: Diagnosis not present

## 2017-06-29 DIAGNOSIS — D869 Sarcoidosis, unspecified: Secondary | ICD-10-CM | POA: Diagnosis not present

## 2017-06-29 DIAGNOSIS — M6281 Muscle weakness (generalized): Secondary | ICD-10-CM | POA: Diagnosis not present

## 2017-07-03 ENCOUNTER — Non-Acute Institutional Stay (SKILLED_NURSING_FACILITY): Payer: PPO | Admitting: Gerontology

## 2017-07-03 DIAGNOSIS — R059 Cough, unspecified: Secondary | ICD-10-CM

## 2017-07-03 DIAGNOSIS — M1711 Unilateral primary osteoarthritis, right knee: Secondary | ICD-10-CM | POA: Diagnosis not present

## 2017-07-03 DIAGNOSIS — R05 Cough: Secondary | ICD-10-CM | POA: Diagnosis not present

## 2017-07-04 ENCOUNTER — Encounter: Payer: Self-pay | Admitting: Gerontology

## 2017-07-04 NOTE — Progress Notes (Signed)
Location:   [place] of Service:  SNF [phone]) Provider:  Lorenso Quarry, NP-C  Karie Schwalbe, MD  Patient Care Team: Karie Schwalbe, MD as PCP - General (Internal Medicine) Kieth Brightly, MD (General Surgery) Lyndon Code, MD (Internal Medicine)  Extended Emergency Contact Information Primary Emergency Contact: Hurley Cisco [place] Mobile Phone: [phone] Relation: Sister  Code Status:  Full Goals of care: Advanced Directive information Advanced Directives 07/03/2017  Does Patient Have a Medical Advance Directive? No  Would patient like information on creating a medical advance directive? -     Chief Complaint  Patient presents with  . Follow-up    Routine Visit    HPI:  Pt is a 83 y.o. female seen today for follow up. Pt was admitted to the facility for rehab following hospitalization for Right Total Knee Replacement. Pt has been participating in PT and OT. Pt reports her pain is well controlled with only Tylenol. Pt only c/o pain is in the anterior RLE in the ankle area. No redness, no warmth, no break in skin integrity. Calves soft, supple. No indication of DVT. Pt also c/o intermittent non-productive cough. No fever, no sputum. No chest pain or shortness of breath. Pt reports her appetite is good and she is having regular BMs. VSS. No other complaints.      Past Medical History:  Diagnosis Date  . Allergic rhinitis due to pollen   . Anxiety   . Aortic atherosclerosis (HCC)   . Coronary artery disease    Stent in 2000's (DC)  . Dyspnea   . GERD (gastroesophageal reflux disease)   . Hard of hearing   . Hypertension   . Neuropathy   . Obstructive sleep apnea   . Osteoarthritis of both knees   . Peripheral vascular disease (HCC)   . Sarcoidosis   . Thyroid nodule    Past Surgical History:  Procedure Laterality Date  . BIOPSY THYROID  2014   Dr Andee Poles  . BREAST BIOPSY Left    surgical bx neg  .  CARDIOVASCULAR STRESS TEST  05/12/2012   with dobutamine---negative. Normal LV function  . CORONARY ANGIOPLASTY  2006  . JOINT REPLACEMENT Left 2000   hip  . JOINT REPLACEMENT Right 2007   hip  . KNEE ARTHROPLASTY Right 06/23/2017   Procedure: COMPUTER ASSISTED TOTAL KNEE ARTHROPLASTY;  Surgeon: Donato Heinz, MD;  Location: [facility] ORS;  Service: Orthopedics;  Laterality: Right;    Allergies  Allergen Reactions  . Tramadol Other (See Comments)    CONFUSION  . Sulfa Antibiotics     Pt dont remember reaction  . Zocor [Simvastatin] Other (See Comments)    INTOLERANCE-MYALGIAS     Allergies as of 07/03/2017      Reactions   Tramadol Other (See Comments)   CONFUSION   Sulfa Antibiotics    Pt dont remember reaction   Zocor [simvastatin] Other (See Comments)   INTOLERANCE-MYALGIAS      Medication List       Accurate as of 07/03/17 11:59 PM. Always use your most recent med list.          acetaminophen 650 MG CR tablet Commonly known as:  TYLENOL Take 650 mg by mouth every 8 (eight) hours as needed for pain.   acetaminophen-codeine 300-30 MG tablet Commonly known as:  TYLENOL #3 Take 1 tablet by mouth 3 (three) times daily as needed for moderate pain.   albuterol 108 (  90 Base) MCG/ACT inhaler Commonly known as:  PROVENTIL HFA Inhale 2 puffs into the lungs every 4 (four) hours as needed for wheezing or shortness of breath.   amLODipine 5 MG tablet Commonly known as:  NORVASC TAKE 1 TABLET (5 MG TOTAL) BY MOUTH DAILY.   enoxaparin 30 MG/0.3ML injection Commonly known as:  LOVENOX Inject 0.3 mLs (30 mg total) into the skin every 12 (twelve) hours.   furosemide 20 MG tablet Commonly known as:  LASIX Take 20 mg by mouth 2 (two) times a week. On Monday and Thursday   latanoprost 0.005 % ophthalmic solution Commonly known as:  XALATAN Place 1 drop into both eyes at bedtime.   LORazepam 0.5 MG tablet Commonly known as:  ATIVAN Take 1 tablet (0.5 mg total) by mouth at  bedtime as needed for anxiety.   metoprolol succinate 50 MG 24 hr tablet Commonly known as:  TOPROL-XL Take 100 mg by mouth daily. Take with or immediately following a meal.   pantoprazole 40 MG tablet Commonly known as:  PROTONIX Take 40 mg by mouth daily.   ranitidine 150 MG tablet Commonly known as:  ZANTAC Take 150 mg by mouth 2 (two) times daily as needed for heartburn.   sertraline 25 MG tablet Commonly known as:  ZOLOFT Take 25 mg by mouth daily as needed (stress).   traMADol 50 MG tablet Commonly known as:  ULTRAM Take 50-100 mg by mouth every 4 (four) hours as needed for moderate pain or severe pain. Moderate pain = 1 (50 mg) tablet Severe = 2 (100 mg) tablets       Review of Systems  Constitutional: Negative for activity change, appetite change, chills, diaphoresis and fever.  HENT: Negative for congestion, sneezing, sore throat, trouble swallowing and voice change.   Respiratory: Negative for apnea, cough, choking, chest tightness, shortness of breath and wheezing.   Cardiovascular: Negative for chest pain, palpitations and leg swelling.  Gastrointestinal: Negative for abdominal distention, abdominal pain, constipation, diarrhea and nausea.  Genitourinary: Negative for difficulty urinating, dysuria, frequency and urgency.  Musculoskeletal: Positive for arthralgias (typical arthritis) and gait problem. Negative for back pain and myalgias.  Skin: Positive for wound. Negative for color change, pallor and rash.  Neurological: Negative for dizziness, tremors, syncope, speech difficulty, weakness, numbness and headaches.  Psychiatric/Behavioral: Negative for agitation and behavioral problems.  All other systems reviewed and are negative.   Immunization History  Administered Date(s) Administered  . Pneumococcal Conjugate-13 09/24/2016   Pertinent  Health Maintenance Due  Topic Date Due  . DEXA SCAN  03/28/1991  . INFLUENZA VACCINE  07/30/2017  . PNA vac Low Risk  Adult (2 of 2 - PPSV23) 09/24/2017   Fall Risk  02/03/2017  Falls in the past year? No   Functional Status Survey:    Vitals:   07/03/17 1503  BP: 135/78  Pulse: 79  Resp: 20  Temp: 97.9 F (36.6 C)  SpO2: 95%  Weight: 221 lb 11.2 oz (100.6 kg)  Height: 5\' 4"  (1.626 m)   Body mass index is 38.05 kg/m. Physical Exam  Constitutional: She is oriented to person, place, and time. Vital signs are normal. She appears well-developed and well-nourished. She is active and cooperative. She does not appear ill. No distress.  HENT:  Head: Normocephalic and atraumatic.  Mouth/Throat: Uvula is midline, oropharynx is clear and moist and mucous membranes are normal. Mucous membranes are not pale, not dry and not cyanotic.  Eyes: Conjunctivae, EOM and lids are normal.  Pupils are equal, round, and reactive to light.  Neck: Trachea normal, normal range of motion and full passive range of motion without pain. Neck supple. No JVD present. No tracheal deviation, no edema and no erythema present. No thyromegaly present.  Cardiovascular: Normal rate, regular rhythm, normal heart sounds, intact distal pulses and normal pulses.  Exam reveals no gallop, no distant heart sounds and no friction rub.   No murmur heard. Pulses:      Dorsalis pedis pulses are 2+ on the right side, and 2+ on the left side.  No edema  Pulmonary/Chest: Effort normal and breath sounds normal. No accessory muscle usage. No respiratory distress. She has no decreased breath sounds. She has no wheezes. She has no rhonchi. She has no rales. She exhibits no tenderness.  Abdominal: Normal appearance and bowel sounds are normal. She exhibits no distension and no ascites. There is no tenderness.  Musculoskeletal: She exhibits no edema.       Right knee: She exhibits decreased range of motion and laceration (incision). Tenderness found.  Expected osteoarthritis, stiffness. Calves soft, supple, Negative Homan's sign  Neurological: She is alert  and oriented to person, place, and time. She has normal strength.  Skin: Skin is warm and dry. Laceration (right knee incisoin) noted. No rash noted. She is not diaphoretic. No cyanosis or erythema. No pallor. Nails show no clubbing.  Psychiatric: She has a normal mood and affect. Her speech is normal and behavior is normal. Judgment and thought content normal. Cognition and memory are normal.  Nursing note and vitals reviewed.   Labs reviewed:  Recent Labs  06/11/17 1405 06/24/17 0540 06/25/17 0621  NA 136 136 135  K 3.9 4.6 4.3  CL 102 110 106  CO2 27 23 27   GLUCOSE 115* 145* 141*  BUN 12 15 15   CREATININE 1.00 1.04* 0.84  CALCIUM 9.0 8.2* 8.4*    Recent Labs  09/14/16 1259 06/11/17 1405  AST 20 20  ALT 14 10*  ALKPHOS 78 94  BILITOT 1.3* 1.1  PROT 6.8 7.5  ALBUMIN 3.9 4.3    Recent Labs  06/11/17 1405 06/24/17 0635 06/25/17 0621  WBC 7.0 9.1 11.1*  HGB 14.3 11.6* 11.6*  HCT 42.4 33.5* 34.0*  MCV 99.0 97.9 98.9  PLT 178 155 146*   No results found for: TSH Lab Results  Component Value Date   HGBA1C 6.1 (H) 06/11/2017   No results found for: CHOL, HDL, LDLCALC, LDLDIRECT, TRIG, CHOLHDL  Significant Diagnostic Results in last 30 days:  Dg Knee Right Port  Result Date: 06/23/2017 CLINICAL DATA:  Right total knee replacement EXAM: PORTABLE RIGHT KNEE - 1-2 VIEW COMPARISON:  None. FINDINGS: The right knee demonstrates a total knee arthroplasty without evidence of hardware failure complication. There is no significant joint effusion. There is no fracture or dislocation. The alignment is anatomic. Surgical drains are present. Post-surgical changes noted in the surrounding soft tissues. Peripheral vascular atherosclerotic disease. IMPRESSION: Right total knee arthroplasty without failure or complication. Electronically Signed   By: Elige Ko   On: 06/23/2017 14:50    Assessment/Plan 1. Primary osteoarthritis of right knee  Continue PT/OT  Continue  exercises as taught by PT/OT  Continue Lovenox 30 mg SQ Q 12 hours until 7/11 for DVT prophylaxis  Continue Tylenol 650 mg po Q 4 hours prn for pain control  Polar Care continuous  Dressing changes per protocol  Follow up with orthopedist as instructed  2. Cough  Continue IS/TCDB  Guaifenesin  10 mL po Q 4 hours prn  Family/ staff Communication:   Total Time:  Documentation:  Face to Face:  Family/Phone:   Labs/tests ordered:    Medication list reviewed and assessed for continued appropriateness. Monthly medication orders reviewed and signed.  Brynda RimShannon H. Reggie Bise, NP-C Geriatrics [facility] [phone] [address] [place], [place] [id number] Cell Phone (Mon-Fri 8am-5pm):  [phone] On Call:  [phone] & follow prompts after 5pm & weekends Office Phone:  [phone] Office Fax:  [phone]

## 2017-07-11 DIAGNOSIS — Z96651 Presence of right artificial knee joint: Secondary | ICD-10-CM | POA: Diagnosis not present

## 2017-07-11 DIAGNOSIS — M25661 Stiffness of right knee, not elsewhere classified: Secondary | ICD-10-CM | POA: Diagnosis not present

## 2017-07-11 DIAGNOSIS — M25561 Pain in right knee: Secondary | ICD-10-CM | POA: Diagnosis not present

## 2017-07-14 DIAGNOSIS — M25561 Pain in right knee: Secondary | ICD-10-CM | POA: Diagnosis not present

## 2017-07-14 DIAGNOSIS — Z96651 Presence of right artificial knee joint: Secondary | ICD-10-CM | POA: Diagnosis not present

## 2017-07-14 DIAGNOSIS — M25661 Stiffness of right knee, not elsewhere classified: Secondary | ICD-10-CM | POA: Diagnosis not present

## 2017-07-17 DIAGNOSIS — Z96651 Presence of right artificial knee joint: Secondary | ICD-10-CM | POA: Diagnosis not present

## 2017-07-17 DIAGNOSIS — M25561 Pain in right knee: Secondary | ICD-10-CM | POA: Diagnosis not present

## 2017-07-17 DIAGNOSIS — M25661 Stiffness of right knee, not elsewhere classified: Secondary | ICD-10-CM | POA: Diagnosis not present

## 2017-07-18 ENCOUNTER — Other Ambulatory Visit: Payer: Self-pay | Admitting: Orthopedic Surgery

## 2017-07-18 ENCOUNTER — Ambulatory Visit
Admission: RE | Admit: 2017-07-18 | Discharge: 2017-07-18 | Disposition: A | Discharge status: Home / Self Care | Payer: PPO | Source: Ambulatory Visit | Attending: Orthopedic Surgery | Admitting: Orthopedic Surgery

## 2017-07-18 ENCOUNTER — Other Ambulatory Visit: Payer: Self-pay | Admitting: Student

## 2017-07-18 DIAGNOSIS — M79604 Pain in right leg: Secondary | ICD-10-CM

## 2017-07-18 DIAGNOSIS — M79661 Pain in right lower leg: Secondary | ICD-10-CM | POA: Diagnosis not present

## 2017-07-21 DIAGNOSIS — M25661 Stiffness of right knee, not elsewhere classified: Secondary | ICD-10-CM | POA: Diagnosis not present

## 2017-07-21 DIAGNOSIS — M25561 Pain in right knee: Secondary | ICD-10-CM | POA: Diagnosis not present

## 2017-07-21 DIAGNOSIS — Z96651 Presence of right artificial knee joint: Secondary | ICD-10-CM | POA: Diagnosis not present

## 2017-07-24 DIAGNOSIS — M25561 Pain in right knee: Secondary | ICD-10-CM | POA: Diagnosis not present

## 2017-07-24 DIAGNOSIS — M25661 Stiffness of right knee, not elsewhere classified: Secondary | ICD-10-CM | POA: Diagnosis not present

## 2017-07-24 DIAGNOSIS — Z96651 Presence of right artificial knee joint: Secondary | ICD-10-CM | POA: Diagnosis not present

## 2017-07-28 DIAGNOSIS — Z96651 Presence of right artificial knee joint: Secondary | ICD-10-CM | POA: Diagnosis not present

## 2017-07-28 DIAGNOSIS — M25561 Pain in right knee: Secondary | ICD-10-CM | POA: Diagnosis not present

## 2017-07-28 DIAGNOSIS — M25661 Stiffness of right knee, not elsewhere classified: Secondary | ICD-10-CM | POA: Diagnosis not present

## 2017-07-29 ENCOUNTER — Ambulatory Visit (INDEPENDENT_AMBULATORY_CARE_PROVIDER_SITE_OTHER): Payer: PPO | Admitting: Internal Medicine

## 2017-07-29 ENCOUNTER — Encounter: Payer: Self-pay | Admitting: Internal Medicine

## 2017-07-29 VITALS — BP 128/72 | HR 80 | Temp 97.7°F | Wt 209.0 lb

## 2017-07-29 DIAGNOSIS — G250 Essential tremor: Secondary | ICD-10-CM

## 2017-07-29 DIAGNOSIS — I10 Essential (primary) hypertension: Secondary | ICD-10-CM | POA: Diagnosis not present

## 2017-07-29 DIAGNOSIS — R5383 Other fatigue: Secondary | ICD-10-CM

## 2017-07-29 DIAGNOSIS — F39 Unspecified mood [affective] disorder: Secondary | ICD-10-CM | POA: Insufficient documentation

## 2017-07-29 MED ORDER — LORAZEPAM 0.5 MG PO TABS: 0.5000 mg | ORAL_TABLET | Freq: Every evening | ORAL | 0 refills | Status: DC | PRN

## 2017-07-29 NOTE — Progress Notes (Signed)
Subjective:    Patient ID: Sheila Long, female    DOB: Mar 02, 1924, 83 y.o.   MRN: [id number]  HPI Here due to fatigue Just 1 month since the TKR-- right [facility] Still some pain Still getting therapy--outpatient -- and is using rolling walker  Still doesn't have much energy Some blurry vision-- early glaucoma and started on drops  Lives alone Still not driving She has cleaning person Neighbor had been bringing dinner--now she is fixing something (not really cooking much)  Still having some trouble sleeping Mild anxiety at times No depression  The lorazepam usually helps  Tremor is more noticeable Affecting her some--like with writing  Current Outpatient Prescriptions on File Prior to Visit  Medication Sig Dispense Refill  . acetaminophen (TYLENOL) 650 MG CR tablet Take 650 mg by mouth every 8 (eight) hours as needed for pain.     Marland Kitchen. amLODipine (NORVASC) 5 MG tablet TAKE 1 TABLET (5 MG TOTAL) BY MOUTH DAILY. 30 tablet 2  . latanoprost (XALATAN) 0.005 % ophthalmic solution Place 1 drop into both eyes at bedtime.  5  . LORazepam (ATIVAN) 0.5 MG tablet Take 1 tablet (0.5 mg total) by mouth at bedtime as needed for anxiety. 15 tablet 0  . metoprolol succinate (TOPROL-XL) 50 MG 24 hr tablet Take 100 mg by mouth daily. Take with or immediately following a meal.     . pantoprazole (PROTONIX) 40 MG tablet Take 40 mg by mouth daily.     . ranitidine (ZANTAC) 150 MG tablet Take 150 mg by mouth 2 (two) times daily as needed for heartburn.     . furosemide (LASIX) 20 MG tablet Take 20 mg by mouth 2 (two) times a week. On Monday and Thursday     No current facility-administered medications on file prior to visit.     Allergies  Allergen Reactions  . Tramadol Other (See Comments)    CONFUSION  . Sulfa Antibiotics     Pt dont remember reaction  . Zocor [Simvastatin] Other (See Comments)    INTOLERANCE-MYALGIAS     Past Medical History:  Diagnosis Date    . Allergic rhinitis due to pollen   . Anxiety   . Aortic atherosclerosis (HCC)   . Coronary artery disease    Stent in 2000's (DC)  . Dyspnea   . GERD (gastroesophageal reflux disease)   . Hard of hearing   . Hypertension   . Neuropathy   . Obstructive sleep apnea   . Osteoarthritis of both knees   . Peripheral vascular disease (HCC)   . Sarcoidosis   . Thyroid nodule     Past Surgical History:  Procedure Laterality Date  . BIOPSY THYROID  2014   Dr Andee PolesVaught  . BREAST BIOPSY Left    surgical bx neg  . CARDIOVASCULAR STRESS TEST  05/12/2012   with dobutamine---negative. Normal LV function  . CORONARY ANGIOPLASTY  2006  . JOINT REPLACEMENT Left 2000   hip  . JOINT REPLACEMENT Right 2007   hip  . KNEE ARTHROPLASTY Right 06/23/2017   Procedure: COMPUTER ASSISTED TOTAL KNEE ARTHROPLASTY;  Surgeon: Donato HeinzHooten, James P, MD;  Location: [facility] ORS;  Service: Orthopedics;  Laterality: Right;    No family history on file.  Social History   Social History  . Marital status: Widowed    Spouse name: N/A  . Number of children: 0  . Years of education: N/A   Occupational History  . 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 Smoking status: Never Smoker  . Smokeless tobacco: Never Used  . Alcohol use No  . Drug use: No  . Sexual activity: Not on file   Other Topics Concern  . Not on file   Social History Narrative   Past living will   Needs to redo health care POA   Requests sister [employer] as decision makers   Would accept resuscitation   Not sure about tube feeds   Review of Systems Weight is down 10# or so from her recent baseline No chest pain Some SOB--this is stable Bowels are slow--needs special tea (smooth move)     Objective:   Physical Exam  Cardiovascular: Normal rate, regular rhythm and normal heart sounds.  Exam reveals no gallop.   No murmur heard. Pulmonary/Chest: Effort normal and breath sounds normal. No  respiratory distress. She has no wheezes. She has no rales.  Abdominal: Soft. She exhibits no distension. There is no tenderness. There is no rebound and no guarding.  Musculoskeletal: She exhibits no edema.   No calf tenderness  Psychiatric: She has a normal mood and affect. Her behavior is normal.          Assessment & Plan:

## 2017-07-29 NOTE — Assessment & Plan Note (Signed)
Mild anxiety--especially at night Will give small refill of lorazepam for rare use

## 2017-07-29 NOTE — Assessment & Plan Note (Signed)
Some increased symptoms Not daily Suggested waiting on med--could consider increasing metoprolol (or add mysoline)

## 2017-07-29 NOTE — Assessment & Plan Note (Signed)
BP Readings from Last 3 Encounters:  07/29/17 128/72  07/03/17 135/78  06/25/17 (!) 152/70   Good control

## 2017-07-29 NOTE — Assessment & Plan Note (Signed)
Has been since TKR only about a month ago Her recovery seems to be great for her age Back to living alone Pain is better No concerning features on exam  Hgb was over 11 post op Discussed observation for now---expect slow improvement

## 2017-08-01 DIAGNOSIS — M25661 Stiffness of right knee, not elsewhere classified: Secondary | ICD-10-CM | POA: Diagnosis not present

## 2017-08-01 DIAGNOSIS — Z96651 Presence of right artificial knee joint: Secondary | ICD-10-CM | POA: Diagnosis not present

## 2017-08-01 DIAGNOSIS — M25561 Pain in right knee: Secondary | ICD-10-CM | POA: Diagnosis not present

## 2017-08-04 DIAGNOSIS — M25561 Pain in right knee: Secondary | ICD-10-CM | POA: Diagnosis not present

## 2017-08-04 DIAGNOSIS — Z96651 Presence of right artificial knee joint: Secondary | ICD-10-CM | POA: Diagnosis not present

## 2017-08-04 DIAGNOSIS — M25661 Stiffness of right knee, not elsewhere classified: Secondary | ICD-10-CM | POA: Diagnosis not present

## 2017-08-05 DIAGNOSIS — Z96651 Presence of right artificial knee joint: Secondary | ICD-10-CM | POA: Diagnosis not present

## 2017-08-07 DIAGNOSIS — M25561 Pain in right knee: Secondary | ICD-10-CM | POA: Diagnosis not present

## 2017-08-07 DIAGNOSIS — Z96651 Presence of right artificial knee joint: Secondary | ICD-10-CM | POA: Diagnosis not present

## 2017-08-07 DIAGNOSIS — M25661 Stiffness of right knee, not elsewhere classified: Secondary | ICD-10-CM | POA: Diagnosis not present

## 2017-08-20 DIAGNOSIS — Z96651 Presence of right artificial knee joint: Secondary | ICD-10-CM | POA: Diagnosis not present

## 2017-08-20 DIAGNOSIS — M25561 Pain in right knee: Secondary | ICD-10-CM | POA: Diagnosis not present

## 2017-08-20 DIAGNOSIS — M25661 Stiffness of right knee, not elsewhere classified: Secondary | ICD-10-CM | POA: Diagnosis not present

## 2017-08-22 ENCOUNTER — Telehealth: Payer: Self-pay | Admitting: Internal Medicine

## 2017-08-22 NOTE — Telephone Encounter (Signed)
Patient Name: Sheila Long  DOB: October 21, 1924    Initial Comment Caller states calling about swelling in her right ankle   Nurse Assessment  Nurse: Durwin Nora, RN, Tresa Endo Date/Time Lamount Cohen Time): 08/22/2017 4:38:02 PM  Confirm and document reason for call. If symptomatic, describe symptoms. ---Caller has swelling in her left ankle which started 3-4 days ago. It is not painful. Sometimes it gets numb in the evening.  Does the patient have any new or worsening symptoms? ---Yes  Will a triage be completed? ---Yes  Related visit to physician within the last 2 weeks? ---Yes  Does the PT have any chronic conditions? (i.e. diabetes, asthma, etc.) ---Yes  List chronic conditions. ---Htn  Is this a behavioral health or substance abuse call? ---No     Guidelines    Guideline Title Affirmed Question Affirmed Notes  Ankle Swelling [1] Thigh, calf, or ankle swelling AND [2] only 1 side    Final Disposition User   See Physician within 4 Hours (or PCP triage) Durwin Nora, RN, Tresa Endo    Comments  Appointment scheduled for 12:15pm [place] office with Dr. Earlene Plater for Saturday 08/23/2017   Referrals  GO TO FACILITY REFUSED   Disagree/Comply: Comply

## 2017-08-22 NOTE — Telephone Encounter (Signed)
Patient Name: Sheila Long  DOB: 03/28/1924    Initial Comment Caller states calling about swelling in her right ankle   Nurse Assessment  Nurse: Durwin Nora, RN, Tresa Endo Date/Time Lamount Cohen Time): 08/22/2017 4:38:02 PM  Confirm and document reason for call. If symptomatic, describe symptoms. ---Caller has swelling in her left ankle which started 3-4 days ago. It is not painful. Sometimes it gets numb in the evening.  Does the patient have any new or worsening symptoms? ---Yes  Will a triage be completed? ---Yes  Related visit to physician within the last 2 weeks? ---Yes  Does the PT have any chronic conditions? (i.e. diabetes, asthma, etc.) ---Yes  List chronic conditions. ---Htn  Is this a behavioral health or substance abuse call? ---No     Guidelines    Guideline Title Affirmed Question Affirmed Notes  Ankle Swelling [1] Thigh, calf, or ankle swelling AND [2] only 1 side    Final Disposition User   See Physician within 4 Hours (or PCP triage) Durwin Nora, RN, Tresa Endo    Comments  Appointment scheduled for 12:15pm [place] office with Dr. Earlene Plater for Saturday 08/23/2017  Caller advised that on call MD states she needs to be seen tomorrow. Appointment made, but when triager contacted caller, caller states she cannot go to [place]. Caller advised to go to UC close to her, contact her surgeon, or seek care at the ER. Caller states she will go to urgent care.   Referrals  GO TO FACILITY REFUSED   Disagree/Comply: Comply     Unable to cancel appt

## 2017-08-23 ENCOUNTER — Ambulatory Visit: Payer: Self-pay | Admitting: Family Medicine

## 2017-08-25 ENCOUNTER — Ambulatory Visit (INDEPENDENT_AMBULATORY_CARE_PROVIDER_SITE_OTHER): Payer: PPO | Admitting: Internal Medicine

## 2017-08-25 ENCOUNTER — Encounter: Payer: Self-pay | Admitting: Internal Medicine

## 2017-08-25 VITALS — BP 122/84 | HR 64 | Temp 98.2°F | Wt 207.4 lb

## 2017-08-25 DIAGNOSIS — R600 Localized edema: Secondary | ICD-10-CM | POA: Diagnosis not present

## 2017-08-25 DIAGNOSIS — Z96651 Presence of right artificial knee joint: Secondary | ICD-10-CM

## 2017-08-25 DIAGNOSIS — G250 Essential tremor: Secondary | ICD-10-CM | POA: Diagnosis not present

## 2017-08-25 DIAGNOSIS — F39 Unspecified mood [affective] disorder: Secondary | ICD-10-CM | POA: Diagnosis not present

## 2017-08-25 NOTE — Telephone Encounter (Signed)
Okay---will assess at the OV 

## 2017-08-25 NOTE — Telephone Encounter (Signed)
I spoke with pt and she has not had any injury; pt did not go to UC; pt scheduled appt today at 4:30 with Dr Alphonsus Sias.

## 2017-08-25 NOTE — Progress Notes (Signed)
Subjective:    Patient ID: Sheila Long, female    DOB: Apr 09, 1924, 83 y.o.   MRN: [id number]  HPI Here due to foot swelling and other issues  4-5 days ago--noticed right foot swelling Worsened 3 days ago Couldn't get to [facility] for evaluation Better with ice and TED hose  Most concerned about blurred vision and tremor Tremor goes back a year ago or so--was on sinemet but I took her off it On Rx for glaucoma --but thinks her glasses aren't helping (seems clearer with glasses off) Not generally dizzy and no syncope Resting tremor at times Handwriting okay No troubles with walking (other than recovering from hip repair) Mother did have tremor also  Still not driving Wants to restart  Current Outpatient Prescriptions on File Prior to Visit  Medication Sig Dispense Refill  . acetaminophen (TYLENOL) 650 MG CR tablet Take 650 mg by mouth every 8 (eight) hours as needed for pain.     Marland Kitchen amLODipine (NORVASC) 5 MG tablet TAKE 1 TABLET (5 MG TOTAL) BY MOUTH DAILY. 30 tablet 2  . latanoprost (XALATAN) 0.005 % ophthalmic solution Place 1 drop into both eyes at bedtime.  5  . metoprolol succinate (TOPROL-XL) 50 MG 24 hr tablet Take 100 mg by mouth daily. Take with or immediately following a meal.     . pantoprazole (PROTONIX) 40 MG tablet Take 40 mg by mouth daily.     . ranitidine (ZANTAC) 150 MG tablet Take 150 mg by mouth 2 (two) times daily as needed for heartburn.      No current facility-administered medications on file prior to visit.     Allergies  Allergen Reactions  . Tramadol Other (See Comments)    CONFUSION  . Sulfa Antibiotics     Pt dont remember reaction  . Zocor [Simvastatin] Other (See Comments)    INTOLERANCE-MYALGIAS     Past Medical History:  Diagnosis Date  . Allergic rhinitis due to pollen   . Anxiety   . Aortic atherosclerosis (HCC)   . Coronary artery disease    Stent in 2000's ([place])  . Dyspnea   . GERD (gastroesophageal reflux disease)     . Hard of hearing   . Hypertension   . Neuropathy   . Obstructive sleep apnea   . Osteoarthritis of both knees   . Peripheral vascular disease (HCC)   . Sarcoidosis   . Thyroid nodule     Past Surgical History:  Procedure Laterality Date  . BIOPSY THYROID  2014   Dr Andee Poles  . BREAST BIOPSY Left    surgical bx neg  . CARDIOVASCULAR STRESS TEST  05/12/2012   with dobutamine---negative. Normal LV function  . CORONARY ANGIOPLASTY  2006  . JOINT REPLACEMENT Left 2000   hip  . JOINT REPLACEMENT Right 2007   hip  . KNEE ARTHROPLASTY Right 06/23/2017   Procedure: COMPUTER ASSISTED TOTAL KNEE ARTHROPLASTY;  Surgeon: Donato Heinz, MD;  Location: [facility] ORS;  Service: Orthopedics;  Laterality: Right;    No family history on file.  Social History   Social History  . Marital status: Widowed    Spouse name: N/A  . Number of children: 0  . Years of education: N/A   Occupational History  . 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 Smoking status: Never Smoker  . Smokeless tobacco: Never Used  . Alcohol use No  . Drug use: No  . Sexual activity: Not  on file   Other Topics Concern  . Not on file   Social History Narrative   Past living will   Needs to redo health care POA   Requests sister [employer] as decision makers   Would accept resuscitation   Not sure about tube feeds   Review of Systems Sugar 117 today Sometimes feels bad--feels the clonazepam helps that    Objective:   Physical Exam  Musculoskeletal:  Trace edema right ankle No tenderness Normal active ROM   Neurological:  Slow gait but not shuffling Very little tremor Not really bradykinesia Fairly normal handwriting          Assessment & Plan:

## 2017-08-25 NOTE — Assessment & Plan Note (Signed)
Mostly on right and is better already Discussed elevation and support hose Best to avoid the furosemide

## 2017-08-25 NOTE — Assessment & Plan Note (Signed)
Doing okay.

## 2017-08-25 NOTE — Assessment & Plan Note (Signed)
Discussed the benzodiazepines She is better off without them--no Rx given

## 2017-08-25 NOTE — Telephone Encounter (Signed)
I spoke with pt and she has not had any injury; pt did not go to UC; pt scheduled appt today at 4:30 with Dr Letvak. 

## 2017-08-25 NOTE — Assessment & Plan Note (Signed)
Mild  Reassured that she is not Parkinsonian No major functional issues so would hold off on meds Sinemet not appropriate

## 2017-08-26 DIAGNOSIS — Z96651 Presence of right artificial knee joint: Secondary | ICD-10-CM | POA: Diagnosis not present

## 2017-08-26 DIAGNOSIS — M25661 Stiffness of right knee, not elsewhere classified: Secondary | ICD-10-CM | POA: Diagnosis not present

## 2017-08-26 DIAGNOSIS — M25561 Pain in right knee: Secondary | ICD-10-CM | POA: Diagnosis not present

## 2017-09-02 DIAGNOSIS — M25561 Pain in right knee: Secondary | ICD-10-CM | POA: Diagnosis not present

## 2017-09-02 DIAGNOSIS — Z96651 Presence of right artificial knee joint: Secondary | ICD-10-CM | POA: Diagnosis not present

## 2017-09-02 DIAGNOSIS — M25661 Stiffness of right knee, not elsewhere classified: Secondary | ICD-10-CM | POA: Diagnosis not present

## 2017-09-09 DIAGNOSIS — M25561 Pain in right knee: Secondary | ICD-10-CM | POA: Diagnosis not present

## 2017-09-09 DIAGNOSIS — M25661 Stiffness of right knee, not elsewhere classified: Secondary | ICD-10-CM | POA: Diagnosis not present

## 2017-09-09 DIAGNOSIS — Z96651 Presence of right artificial knee joint: Secondary | ICD-10-CM | POA: Diagnosis not present

## 2017-09-16 DIAGNOSIS — M5416 Radiculopathy, lumbar region: Secondary | ICD-10-CM | POA: Diagnosis not present

## 2017-09-16 DIAGNOSIS — Z96651 Presence of right artificial knee joint: Secondary | ICD-10-CM | POA: Diagnosis not present

## 2017-09-16 DIAGNOSIS — M25551 Pain in right hip: Secondary | ICD-10-CM | POA: Diagnosis not present

## 2017-10-14 ENCOUNTER — Telehealth: Payer: Self-pay | Admitting: Internal Medicine

## 2017-10-14 NOTE — Telephone Encounter (Signed)
Best number [phone]   Pt called wanting to get a referral to endocrinology She stated her feet are still swelling and painful She wants pm appointment she would like to go to [place]

## 2017-10-15 DIAGNOSIS — L648 Other androgenic alopecia: Secondary | ICD-10-CM | POA: Diagnosis not present

## 2017-10-15 DIAGNOSIS — L298 Other pruritus: Secondary | ICD-10-CM | POA: Diagnosis not present

## 2017-10-15 NOTE — Telephone Encounter (Signed)
Spoke to pt and advised per Dr Letvak 

## 2017-10-15 NOTE — Telephone Encounter (Signed)
I am not sure why she would want an endocrinologist if she is still concerned about the leg swelling (not a glandular problem). If she really is still concerned mostly about the swelling, she should stop the amlodipine which could be contributing to it--then schedule back in office in about 2 weeks to see if better and to recheck her BP. Find out if she has another concern about seeing an endocrinologist

## 2017-10-15 NOTE — Telephone Encounter (Signed)
Let her know that we can do the blood work at that visit---she should not eat for 3-4 hours before the visit. Even if she has early diabetes, I am not sure we need to treat (and she certainly doesn't need an endocrinologist)

## 2017-10-15 NOTE — Telephone Encounter (Signed)
Spoke to pt and advised. States she was wanting to have bloodwork to confirm or deny DM. She will stop the amlodipine to see if swelling subsides and she already has f/u appt scheduled for 11/1

## 2017-10-21 ENCOUNTER — Emergency Department: Payer: PPO

## 2017-10-21 ENCOUNTER — Emergency Department
Admission: EM | Admit: 2017-10-21 | Discharge: 2017-10-21 | Disposition: A | Discharge status: Home / Self Care | Payer: PPO | Attending: Emergency Medicine | Admitting: Emergency Medicine

## 2017-10-21 ENCOUNTER — Ambulatory Visit: Payer: Self-pay | Admitting: *Deleted

## 2017-10-21 ENCOUNTER — Ambulatory Visit: Payer: Self-pay

## 2017-10-21 ENCOUNTER — Encounter: Payer: Self-pay | Admitting: Emergency Medicine

## 2017-10-21 DIAGNOSIS — I509 Heart failure, unspecified: Secondary | ICD-10-CM | POA: Diagnosis not present

## 2017-10-21 DIAGNOSIS — I110 Hypertensive heart disease with heart failure: Secondary | ICD-10-CM | POA: Insufficient documentation

## 2017-10-21 DIAGNOSIS — J90 Pleural effusion, not elsewhere classified: Secondary | ICD-10-CM | POA: Diagnosis not present

## 2017-10-21 DIAGNOSIS — Z79899 Other long term (current) drug therapy: Secondary | ICD-10-CM | POA: Diagnosis not present

## 2017-10-21 DIAGNOSIS — R0602 Shortness of breath: Secondary | ICD-10-CM | POA: Insufficient documentation

## 2017-10-21 DIAGNOSIS — I2510 Atherosclerotic heart disease of native coronary artery without angina pectoris: Secondary | ICD-10-CM | POA: Diagnosis not present

## 2017-10-21 DIAGNOSIS — R7989 Other specified abnormal findings of blood chemistry: Secondary | ICD-10-CM | POA: Diagnosis not present

## 2017-10-21 LAB — BASIC METABOLIC PANEL
Anion gap: 10 (ref 5–15)
BUN: 13 mg/dL (ref 6–20)
CO2: 23 mmol/L (ref 22–32)
Calcium: 9 mg/dL (ref 8.9–10.3)
Chloride: 108 mmol/L (ref 101–111)
Creatinine, Ser: 0.94 mg/dL (ref 0.44–1.00)
GFR calc Af Amer: 60 mL/min — ABNORMAL LOW (ref >60)
GFR calc non Af Amer: 51 mL/min — ABNORMAL LOW (ref >60)
Glucose, Bld: 101 mg/dL — ABNORMAL HIGH (ref 65–99)
Potassium: 4 mmol/L (ref 3.5–5.1)
Sodium: 141 mmol/L (ref 135–145)

## 2017-10-21 LAB — HEPATIC FUNCTION PANEL
ALT: 10 U/L — ABNORMAL LOW (ref 14–54)
AST: 20 U/L (ref 15–41)
Albumin: 4.1 g/dL (ref 3.5–5.0)
Alkaline Phosphatase: 81 U/L (ref 38–126)
Bilirubin, Direct: 0.1 mg/dL (ref 0.1–0.5)
Indirect Bilirubin: 1 mg/dL — ABNORMAL HIGH (ref 0.3–0.9)
Total Bilirubin: 1.1 mg/dL (ref 0.3–1.2)
Total Protein: 7.2 g/dL (ref 6.5–8.1)

## 2017-10-21 LAB — CBC WITH DIFFERENTIAL/PLATELET
Basophils Absolute: 0 10*3/uL (ref 0–0.1)
Basophils Relative: 1 %
Eosinophils Absolute: 0.1 10*3/uL (ref 0–0.7)
Eosinophils Relative: 1 %
HCT: 41.7 % (ref 35.0–47.0)
Hemoglobin: 13.9 g/dL (ref 12.0–16.0)
Lymphocytes Relative: 27 %
Lymphs Abs: 2.3 10*3/uL (ref 1.0–3.6)
MCH: 32.4 pg (ref 26.0–34.0)
MCHC: 33.3 g/dL (ref 32.0–36.0)
MCV: 97.3 fL (ref 80.0–100.0)
Monocytes Absolute: 0.6 10*3/uL (ref 0.2–0.9)
Monocytes Relative: 7 %
Neutro Abs: 5.6 10*3/uL (ref 1.4–6.5)
Neutrophils Relative %: 64 %
Platelets: 183 10*3/uL (ref 150–440)
RBC: 4.28 MIL/uL (ref 3.80–5.20)
RDW: 14.8 % — ABNORMAL HIGH (ref 11.5–14.5)
WBC: 8.6 10*3/uL (ref 3.6–11.0)

## 2017-10-21 LAB — TROPONIN I: Troponin I: <0.03 ng/mL (ref <0.03)

## 2017-10-21 LAB — BRAIN NATRIURETIC PEPTIDE: B Natriuretic Peptide: 298 pg/mL — ABNORMAL HIGH (ref 0.0–100.0)

## 2017-10-21 LAB — TSH: TSH: 2.504 u[IU]/mL (ref 0.350–4.500)

## 2017-10-21 LAB — FIBRIN DERIVATIVES D-DIMER (ARMC ONLY): Fibrin derivatives D-dimer (ARMC): 1327.13 ng/mL (FEU) — ABNORMAL HIGH (ref 0.00–499.00)

## 2017-10-21 MED ORDER — IOPAMIDOL (ISOVUE-370) INJECTION 76%
75.0000 mL | Freq: Once | INTRAVENOUS | Status: AC | PRN
  Administered 2017-10-21: 75 mL via INTRAVENOUS

## 2017-10-21 MED ORDER — ALBUTEROL SULFATE (2.5 MG/3ML) 0.083% IN NEBU: 5.0000 mg | INHALATION_SOLUTION | Freq: Once | RESPIRATORY_TRACT | Status: DC

## 2017-10-21 MED ORDER — FUROSEMIDE 20 MG PO TABS: 20.0000 mg | ORAL_TABLET | Freq: Every day | ORAL | 0 refills | Status: DC

## 2017-10-21 NOTE — Telephone Encounter (Signed)
   Reason for Disposition . [1] MODERATE difficulty breathing (e.g., speaks in phrases, SOB even at rest, pulse 100-120) AND [2] NEW-onset or WORSE than normal  Answer Assessment - Initial Assessment Questions 1. RESPIRATORY STATUS: "Describe your breathing?" (e.g., wheezing, shortness of breath, unable to speak, severe coughing)    SOB, talking in short sentences 2. ONSET: "When did this breathing problem begin?"      Past 2-3 days today has gotten worse 3. PATTERN "Does the difficult breathing come and go, or has it been constant since it started?"      constant 4. SEVERITY: "How bad is your breathing?" (e.g., mild, moderate, severe)    - MILD: No SOB at rest, mild SOB with walking, speaks normally in sentences, can lay down, no retractions, pulse < 100.    - MODERATE: SOB at rest, SOB with minimal exertion and prefers to sit, cannot lie down flat, speaks in phrases, mild retractions, audible wheezing, pulse 100-120.    - SEVERE: Very SOB at rest, speaks in single words, struggling to breathe, sitting hunched forward, retractions, pulse > 120      Moderate  5. RECURRENT SYMPTOM: "Have you had difficulty breathing before?" If so, ask: "When was the last time?" and "What happened that time?"      SOB quite some time 6. CARDIAC HISTORY: "Do you have any history of heart disease?" (e.g., heart attack, angina, bypass surgery, angioplasty)      Stent having pain in right shoulder 7. LUNG HISTORY: "Do you have any history of lung disease?"  (e.g., pulmonary embolus, asthma, emphysema)     no 8. CAUSE: "What do you think is causing the breathing problem?"      I dont know  9. OTHER SYMPTOMS: "Do you have any other symptoms? (e.g., dizziness, runny nose, cough, chest pain, fever)     Dizziness blurred vision  10. PREGNANCY: "Is there any chance you are pregnant?" "When was your last menstrual period?"       n/a 11. TRAVEL: "Have you traveled out of the country in the last month?" (e.g., travel  history, exposures)       n/a  Protocols used: BREATHING DIFFICULTY-A-AH  Pt also had sharp pain "just one time" to right shoulder. Per protocol advised pt to call 911 or get ride with neighbor. Advised pt not to drive self.

## 2017-10-21 NOTE — ED Notes (Signed)
Patient with CT 

## 2017-10-21 NOTE — Telephone Encounter (Signed)
Lonia FarberSarah Ellington [facility]: Advised pt to go to ED via neighbor or 911 call.  (Routing comment)

## 2017-10-21 NOTE — Discharge Instructions (Signed)
Please take Lasix 20 mg by mouth once a day and make sure you follow up in the heart failure clinic this week for recheck. Return to the emergency department sooner for any concerns whatsoever.  It was a pleasure to take care of you today, and thank you for coming to our emergency department.  If you have any questions or concerns before leaving please ask the nurse to grab me and I'm more than happy to go through your aftercare instructions again.  If you were prescribed any opioid pain medication today such as Norco, Vicodin, Percocet, morphine, hydrocodone, or oxycodone please make sure you do not drive when you are taking this medication as it can alter your ability to drive safely.  If you have any concerns once you are home that you are not improving or are in fact getting worse before you can make it to your follow-up appointment, please do not hesitate to call 911 and come back for further evaluation.  Merrily Brittle, MD  Results for orders placed or performed during the hospital encounter of 10/21/17  CBC with Differential  Result Value Ref Range   WBC 8.6 3.6 - 11.0 K/uL   RBC 4.28 3.80 - 5.20 MIL/uL   Hemoglobin 13.9 12.0 - 16.0 g/dL   HCT 09.8 11.9 - 14.7 %   MCV 97.3 80.0 - 100.0 fL   MCH 32.4 26.0 - 34.0 pg   MCHC 33.3 32.0 - 36.0 g/dL   RDW 82.9 (H) 56.2 - 13.0 %   Platelets 183 150 - 440 K/uL   Neutrophils Relative % 64 %   Neutro Abs 5.6 1.4 - 6.5 K/uL   Lymphocytes Relative 27 %   Lymphs Abs 2.3 1.0 - 3.6 K/uL   Monocytes Relative 7 %   Monocytes Absolute 0.6 0.2 - 0.9 K/uL   Eosinophils Relative 1 %   Eosinophils Absolute 0.1 0 - 0.7 K/uL   Basophils Relative 1 %   Basophils Absolute 0.0 0 - 0.1 K/uL  Brain natriuretic peptide  Result Value Ref Range   B Natriuretic Peptide 298.0 (H) 0.0 - 100.0 pg/mL  Basic metabolic panel  Result Value Ref Range   Sodium 141 135 - 145 mmol/L   Potassium 4.0 3.5 - 5.1 mmol/L   Chloride 108 101 - 111 mmol/L   CO2 23 22 - 32  mmol/L   Glucose, Bld 101 (H) 65 - 99 mg/dL   BUN 13 6 - 20 mg/dL   Creatinine, Ser 8.65 0.44 - 1.00 mg/dL   Calcium 9.0 8.9 - 78.4 mg/dL   GFR calc non Af Amer 51 (L) >60 mL/min   GFR calc Af Amer 60 (L) >60 mL/min   Anion gap 10 5 - 15  Hepatic function panel  Result Value Ref Range   Total Protein 7.2 6.5 - 8.1 g/dL   Albumin 4.1 3.5 - 5.0 g/dL   AST 20 15 - 41 U/L   ALT 10 (L) 14 - 54 U/L   Alkaline Phosphatase 81 38 - 126 U/L   Total Bilirubin 1.1 0.3 - 1.2 mg/dL   Bilirubin, Direct 0.1 0.1 - 0.5 mg/dL   Indirect Bilirubin 1.0 (H) 0.3 - 0.9 mg/dL  Troponin I  Result Value Ref Range   Troponin I <0.03 <0.03 ng/mL  Fibrin derivatives D-Dimer  Result Value Ref Range   Fibrin derivatives D-dimer ([facility]) 1,327.13 (H) 0.00 - 499.00 ng/mL (FEU)  TSH  Result Value Ref Range   TSH 2.504 0.350 - 4.500  uIU/mL   Dg Chest 2 View  Result Date: 10/21/2017 CLINICAL DATA:  83 year old presenting with acute onset of shortness of breath and bilateral lower extremity edema. EXAM: CHEST  2 VIEW COMPARISON:  04/03/2017, 12/20/2016 and earlier. FINDINGS: Cardiac silhouette mildly enlarged, unchanged. Thoracic aorta atherosclerotic, unchanged. Hilar and mediastinal contours otherwise unremarkable. Mild pulmonary venous hypertension without overt edema. No confluent airspace consolidation. Small bilateral pleural effusions. Osseous demineralization. Visualized bony thorax otherwise intact. Degenerative changes involving the shoulders. IMPRESSION: 1. Stable mild cardiomegaly. Mild pulmonary venous hypertension without overt edema. 2. Small bilateral pleural effusions. No acute cardiopulmonary disease otherwise. 3.  Aortic Atherosclerosis (ICD10-170.0) Electronically Signed   By: Hulan Saashomas  Lawrence M.D.   On: 10/21/2017 18:06   Ct Angio Chest Pe W/cm &/or Wo Cm  Result Date: 10/21/2017 CLINICAL DATA:  83 year old female with shortness of breath and lower extremity edema. Positive D-dimer. EXAM: CT  ANGIOGRAPHY CHEST WITH CONTRAST TECHNIQUE: Multidetector CT imaging of the chest was performed using the standard protocol during bolus administration of intravenous contrast. Multiplanar CT image reconstructions and MIPs were obtained to evaluate the vascular anatomy. CONTRAST:  75 cc Isovue 370 COMPARISON:  Chest CT dated 04/24/2016 and CT dated 03/23/2015 FINDINGS: Cardiovascular: There is mild cardiomegaly. There is coronary vascular calcification. No pericardial effusion. There is advanced atherosclerotic calcification of the thoracic aorta. The ascending aorta measures 4.2 cm in greatest transverse axial diameter. There is a focal pleural thrombus measuring 16 x 13 mm in the distal descending thoracic aorta also present on the prior CT. Evaluation of the aorta is limited due to suboptimal opacification and timing of the contrast. There is no CT evidence of pulmonary embolism. Mediastinum/Nodes: Mildly enlarged right hilar lymph nodes measuring 16 mm in short axis. There is a small hiatal hernia. The esophagus is grossly unremarkable as visualized. No mediastinal fluid collection. Probable right thyroid nodule. Ultrasound may provide better evaluation of the thyroid gland. Lungs/Pleura: Small bilateral pleural effusions with associated subsegmental compressive atelectasis of the adjacent lung bases. There is a 4 mm right upper lobe pulmonary nodule (series 6, image 44) which appears similar to the study of 04/24/2016. There is no pneumothorax. The central airways are patent. Upper Abdomen: No acute abnormality. Musculoskeletal: Mild diffuse subcutaneous edema. There is osteopenia with degenerative changes of the spine. No acute osseous pathology. Review of the MIP images confirms the above findings. IMPRESSION: 1. No CT evidence of pulmonary embolism. 2. Small bilateral pleural effusions with minimal compressive atelectasis of the lung bases. 3. A 4 mm right upper lobe pulmonary nodule stable since the study of  03/23/2015. 4. Mildly enlarged right hilar lymph node, of indeterminate etiology, likely reactive. Clinical correlation is recommended. 5.  Aortic Atherosclerosis (ICD10-I70.0). 6. Lower thoracic aorta mural thrombus similar to the prior CT. 7. Mild cardiomegaly. Electronically Signed   By: Elgie CollardArash  Radparvar M.D.   On: 10/21/2017 21:38

## 2017-10-21 NOTE — Telephone Encounter (Signed)
Patient called back- Sheila SagoSarah took call- see triage call.

## 2017-10-21 NOTE — ED Provider Notes (Signed)
[facility] Emergency Department Provider Note  ____________________________________________   First MD Initiated Contact with Patient 10/21/17 1935     (approximate)  I have reviewed the triage vital signs and the nursing notes.   HISTORY  Chief Complaint [name]    HPI Sheila Long is a 83 y.o. female who comes to the emergency department with 1 week of progressive exertional shortness of breath and bilateral lower extremity swelling. She reports a past medical history of coronary artery disease as well as hypertension.She denies history of deep vein thrombosis or pulmonary embolism. She has no hemoptysis. She's had no recent travel surgery or immobilization. She sleeps on 2 pillows and does not wake at night short of breath. She denies fevers or chills. Symptoms have been slowly progressive. Her primary care physician recently started her on Lasix however she does not like getting up at night to urinate.   Past Medical History:  Diagnosis Date  . Allergic rhinitis due to pollen   . Anxiety   . Aortic atherosclerosis (HCC)   . Coronary artery disease    Stent in 2000's ([place])  . Dyspnea   . GERD (gastroesophageal reflux disease)   . Hard of hearing   . Hypertension   . Neuropathy   . Obstructive sleep apnea   . Osteoarthritis of both knees   . Peripheral vascular disease (HCC)   . Sarcoidosis   . Thyroid nodule     Patient Active Problem List   Diagnosis Date Noted  . Fatigue 07/29/2017  . Mood disorder (HCC) 07/29/2017  . S/P total knee arthroplasty 06/23/2017  . Pulmonary hypertension associated with sarcoidosis (HCC) 02/03/2017  . Dyspnea on exertion 11/28/2016  . Lower extremity edema 11/28/2016  . Familial tremor 09/24/2016  . Sarcoidosis   . Hypertension   . Allergic rhinitis due to pollen   . Coronary artery disease   . Aortic atherosclerosis (HCC)   . GERD (gastroesophageal reflux disease)   . Peripheral  vascular disease (HCC)   . Osteoarthritis of both knees   . Obstructive sleep apnea   . Neuropathy     Past Surgical History:  Procedure Laterality Date  . BIOPSY THYROID  2014   Dr Andee PolesVaught  . BREAST BIOPSY Left    surgical bx neg  . CARDIOVASCULAR STRESS TEST  05/12/2012   with dobutamine---negative. Normal LV function  . CORONARY ANGIOPLASTY  2006  . JOINT REPLACEMENT Left 2000   hip  . JOINT REPLACEMENT Right 2007   hip  . KNEE ARTHROPLASTY Right 06/23/2017   Procedure: COMPUTER ASSISTED TOTAL KNEE ARTHROPLASTY;  Surgeon: Donato HeinzHooten, James P, MD;  Location: [facility] ORS;  Service: Orthopedics;  Laterality: Right;    Prior to Admission medications   Medication Sig Start Date End Date Taking? Authorizing Provider  acetaminophen (TYLENOL) 650 MG CR tablet Take 650 mg by mouth every 8 (eight) hours as needed for pain.     [provider]  amLODipine (NORVASC) 5 MG tablet TAKE 1 TABLET (5 MG TOTAL) BY MOUTH DAILY. 04/17/17   Karie SchwalbeLetvak, Richard I, MD  furosemide (LASIX) 20 MG tablet Take 1 tablet (20 mg total) by mouth daily. 10/21/17 10/21/18  Merrily Brittleifenbark, Thea Holshouser, MD  latanoprost (XALATAN) 0.005 % ophthalmic solution Place 1 drop into both eyes at bedtime. 05/30/17   [provider]  metoprolol succinate (TOPROL-XL) 50 MG 24 hr tablet Take 100 mg by mouth daily. Take with or immediately following a meal.     [provider]  pantoprazole (PROTONIX) 40 MG tablet Take 40 mg by mouth daily.     [provider]  ranitidine (ZANTAC) 150 MG tablet Take 150 mg by mouth 2 (two) times daily as needed for heartburn.     [provider]    Allergies Tramadol; Sulfa antibiotics; and Zocor [simvastatin]  No family history on file.  Social History Social History  Substance Use Topics  . Smoking status: Never Smoker  . Smokeless tobacco: Never Used  . Alcohol use No    Review of Systems Constitutional: No fever/chills Eyes: No visual changes. ENT: No sore  throat. Cardiovascular: Denies chest pain. Respiratory: Positive for shortness of breath. Gastrointestinal: No abdominal pain.  No nausea, no vomiting.  No diarrhea.  No constipation. Genitourinary: Negative for dysuria. Musculoskeletal: Negative for back pain. Skin: Negative for rash. Neurological: Negative for headaches, focal weakness or numbness.   ____________________________________________   PHYSICAL EXAM:  VITAL SIGNS: ED Triage Vitals [10/21/17 1719]  Enc Vitals Group     BP (!) 180/81     Pulse Rate 72     Resp      Temp 98.6 F (37 C)     Temp Source Oral     SpO2 97 %     Weight 210 lb (95.3 kg)     Height 5\' 4"  (1.626 m)     Head Circumference      Peak Flow      Pain Score      Pain Loc      Pain Edu?      Excl. in GC?     Constitutional: Alert and oriented 4 well appearing nontoxic no diaphoresis speaks in full clear sentences joking and laughing Eyes: PERRL EOMI. Head: Atraumatic. Nose: No congestion/rhinnorhea. Mouth/Throat: No trismus Neck: No stridor.   Cardiovascular: Normal rate, regular rhythm. Grossly normal heart sounds.  Good peripheral circulation. Able to lie completely flat with no jugular venous distention Respiratory: Normal respiratory effort.  No retractions. Mild crackles at bilateral bases Gastrointestinal: Soft nontender Musculoskeletal: Legs are equal in size 1+ pitting edema bilateral mid shin. Neurologic:  Normal speech and language. No gross focal neurologic deficits are appreciated. Skin:  Skin is warm, dry and intact. No rash noted. Psychiatric: Mood and affect are normal. Speech and behavior are normal.    ____________________________________________   DIFFERENTIAL includes but not limited to  Congestive heart failure, pulmonary edema, pulmonary embolism ____________________________________________   LABS (all labs ordered are listed, but only abnormal results are displayed)  Labs Reviewed  CBC WITH  DIFFERENTIAL/PLATELET - Abnormal; Notable for the following:       Result Value   RDW 14.8 (*)    All other components within normal limits  BRAIN NATRIURETIC PEPTIDE - Abnormal; Notable for the following:    B Natriuretic Peptide 298.0 (*)    All other components within normal limits  BASIC METABOLIC PANEL - Abnormal; Notable for the following:    Glucose, Bld 101 (*)    GFR calc non Af Amer 51 (*)    GFR calc Af Amer 60 (*)    All other components within normal limits  HEPATIC FUNCTION PANEL - Abnormal; Notable for the following:    ALT 10 (*)    Indirect Bilirubin 1.0 (*)    All other components within normal limits  FIBRIN DERIVATIVES D-DIMER ([facility] ONLY) - Abnormal; Notable for the following:    Fibrin derivatives D-dimer [facility]) 1,327.13 (*)    All other components within  normal limits  TROPONIN I  TSH  URINALYSIS, COMPLETE (UACMP) WITH MICROSCOPIC    : Reviewed and interpreted by me shows slightly elevated beta natruretic peptide also no evidence of acute ischemia __________________________________________  EKG  ED ECG REPORT I, Merrily Brittle, the attending physician, personally viewed and interpreted this ECG.  Date: 10/21/2017 EKG Time:  Rate: 69 Rhythm: normal sinus rhythm QRS Axis: normal Intervals: normal ST/T Wave abnormalities: normal Narrative Interpretation: no evidence of acute ischemia  ____________________________________________  RADIOLOGY  CT angiogram of the chest reviewed by me shows no evidence of pulmonary embolism ____________________________________________   PROCEDURES  Procedure(s) performed: no  Procedures  Critical Care performed: no  Observation: no ____________________________________________   INITIAL IMPRESSION / ASSESSMENT AND PLAN / ED COURSE  Pertinent labs & imaging results that were available during my care of the patient were reviewed by me and considered in my medical decision making (see chart for details).       ----------------------------------------- 8:33 PM on 10/21/2017 -----------------------------------------  The patient has unexplained shortness of breath and had major surgery 2 months ago. Her d-dimer is positive so I'll add on to CT pulmonary angiogram. ____________________________________________   ----------------------------------------- 9:53 PM on 10/21/2017 -----------------------------------------  Fortunately the patient's CT angiogram is negative for acute pathology. She is able to lie completely flat. On chart review one year ago she did have an ejection fraction of 55% with grade 1 diastolic dysfunction.  At this point the patient feels improved and she is able to ambulate.  I'll increase her lasix to 20mg  daily and have her follow up in heart failure clinic.  She verbalizes understanding and agreement with the plan.   FINAL CLINICAL IMPRESSION(S) / ED DIAGNOSES  Final diagnoses:  [name]  Acute on chronic congestive heart failure, unspecified heart failure type (HCC)      NEW MEDICATIONS STARTED DURING THIS VISIT:  Discharge Medication List as of 10/21/2017  9:53 PM    START taking these medications   Details  furosemide (LASIX) 20 MG tablet Take 1 tablet (20 mg total) by mouth daily., Starting Tue 10/21/2017, Until Wed 10/21/2018, Print         Note:  This document was prepared using Dragon voice recognition software and may include unintentional dictation errors.     Merrily Brittle, MD 10/22/17 Jacinta Shoe

## 2017-10-21 NOTE — ED Triage Notes (Signed)
Patient presents to ED via POV from home with c/o SOB and lower leg edema. Patient denies CP. Patient speaking in full sentences. Patient states edema in her legs gets better with elevation. Even and non labored respirations noted.

## 2017-10-22 NOTE — Telephone Encounter (Signed)
Seen in ER and sent home Furosemide daily Has follow up next week

## 2017-10-23 ENCOUNTER — Telehealth: Payer: Self-pay

## 2017-10-23 DIAGNOSIS — D860 Sarcoidosis of lung: Secondary | ICD-10-CM | POA: Diagnosis not present

## 2017-10-23 DIAGNOSIS — I739 Peripheral vascular disease, unspecified: Secondary | ICD-10-CM | POA: Diagnosis not present

## 2017-10-23 DIAGNOSIS — D869 Sarcoidosis, unspecified: Secondary | ICD-10-CM | POA: Diagnosis not present

## 2017-10-23 DIAGNOSIS — R0609 Other forms of dyspnea: Secondary | ICD-10-CM | POA: Diagnosis not present

## 2017-10-23 DIAGNOSIS — R0602 Shortness of breath: Secondary | ICD-10-CM | POA: Diagnosis not present

## 2017-10-23 DIAGNOSIS — I2510 Atherosclerotic heart disease of native coronary artery without angina pectoris: Secondary | ICD-10-CM | POA: Diagnosis not present

## 2017-10-23 DIAGNOSIS — E785 Hyperlipidemia, unspecified: Secondary | ICD-10-CM | POA: Diagnosis not present

## 2017-10-23 DIAGNOSIS — I10 Essential (primary) hypertension: Secondary | ICD-10-CM | POA: Diagnosis not present

## 2017-10-23 DIAGNOSIS — I2729 Other secondary pulmonary hypertension: Secondary | ICD-10-CM | POA: Diagnosis not present

## 2017-10-23 NOTE — Telephone Encounter (Signed)
Initial appointment made for CHF clinic on  10/27/2017 at 2:40 pm. Thank you for the referral.

## 2017-10-23 NOTE — Telephone Encounter (Signed)
-----   Message from Delma Freezeina A Hackney, [place] sent at 10/23/2017 12:46 PM EDT ----- Regarding: ED referral Was in the ED 10/23

## 2017-10-27 ENCOUNTER — Encounter: Payer: Self-pay | Admitting: Family

## 2017-10-27 ENCOUNTER — Ambulatory Visit: Payer: PPO | Attending: Family | Admitting: Family

## 2017-10-27 VITALS — BP 159/67 | HR 69 | Resp 18 | Ht 64.0 in | Wt 211.2 lb

## 2017-10-27 DIAGNOSIS — F419 Anxiety disorder, unspecified: Secondary | ICD-10-CM | POA: Insufficient documentation

## 2017-10-27 DIAGNOSIS — Z9889 Other specified postprocedural states: Secondary | ICD-10-CM | POA: Insufficient documentation

## 2017-10-27 DIAGNOSIS — Z96643 Presence of artificial hip joint, bilateral: Secondary | ICD-10-CM | POA: Diagnosis not present

## 2017-10-27 DIAGNOSIS — Z79899 Other long term (current) drug therapy: Secondary | ICD-10-CM | POA: Diagnosis not present

## 2017-10-27 DIAGNOSIS — G4733 Obstructive sleep apnea (adult) (pediatric): Secondary | ICD-10-CM | POA: Insufficient documentation

## 2017-10-27 DIAGNOSIS — R0602 Shortness of breath: Secondary | ICD-10-CM | POA: Diagnosis not present

## 2017-10-27 DIAGNOSIS — I110 Hypertensive heart disease with heart failure: Secondary | ICD-10-CM | POA: Insufficient documentation

## 2017-10-27 DIAGNOSIS — R42 Dizziness and giddiness: Secondary | ICD-10-CM | POA: Insufficient documentation

## 2017-10-27 DIAGNOSIS — K219 Gastro-esophageal reflux disease without esophagitis: Secondary | ICD-10-CM | POA: Diagnosis not present

## 2017-10-27 DIAGNOSIS — I2510 Atherosclerotic heart disease of native coronary artery without angina pectoris: Secondary | ICD-10-CM | POA: Diagnosis not present

## 2017-10-27 DIAGNOSIS — I5032 Chronic diastolic (congestive) heart failure: Secondary | ICD-10-CM | POA: Diagnosis not present

## 2017-10-27 DIAGNOSIS — G629 Polyneuropathy, unspecified: Secondary | ICD-10-CM | POA: Insufficient documentation

## 2017-10-27 DIAGNOSIS — I739 Peripheral vascular disease, unspecified: Secondary | ICD-10-CM | POA: Insufficient documentation

## 2017-10-27 DIAGNOSIS — Z882 Allergy status to sulfonamides status: Secondary | ICD-10-CM | POA: Diagnosis not present

## 2017-10-27 DIAGNOSIS — Z885 Allergy status to narcotic agent status: Secondary | ICD-10-CM | POA: Insufficient documentation

## 2017-10-27 DIAGNOSIS — I10 Essential (primary) hypertension: Secondary | ICD-10-CM

## 2017-10-27 DIAGNOSIS — D869 Sarcoidosis, unspecified: Secondary | ICD-10-CM | POA: Insufficient documentation

## 2017-10-27 DIAGNOSIS — E079 Disorder of thyroid, unspecified: Secondary | ICD-10-CM | POA: Insufficient documentation

## 2017-10-27 DIAGNOSIS — Z888 Allergy status to other drugs, medicaments and biological substances status: Secondary | ICD-10-CM | POA: Insufficient documentation

## 2017-10-27 DIAGNOSIS — Z96651 Presence of right artificial knee joint: Secondary | ICD-10-CM | POA: Diagnosis not present

## 2017-10-27 NOTE — Patient Instructions (Addendum)
Continue weighing daily and call for an overnight weight gain of > 2 pounds or a weekly weight gain of >5 pounds.  Call back when you get home to go over medications and instructions on how to take it.

## 2017-10-27 NOTE — Progress Notes (Signed)
Patient ID: Sheila Long, female    DOB: 04/19/1924, 83 y.o.   MRN: [id number]  HPI  Sheila Long is a 83 y/o female with a history of GERD, PVD, obstructive sleep apnea (CPAP), anxiety, HTN, thyroid disease, CAD and chronic heart failure.   Echo done 11/28/16 reviewed and showed an EF of 55-60% along with mild AR/TR and moderate MR. There was also mildly increased PA pressure of 38 mm Hg.  Was in the ED 10/21/17 due to shortness of breath. CT angiogram was negative and she was treated and released. Was admitted 06/23/17 for a right knee replacement. Discharged after 2 days.   She presents today for her initial visit with a chief complaint of moderate fatigue upon minimal exertion. She describes this as chronic in nature having been present for several years with varying levels of severity. She has associated shortness of breath, anxiety, light-headedness and burning in the bottom of her feet. She denies any chest pain, edema, weight gain or difficulty sleeping.   Past Medical History:  Diagnosis Date  . Allergic rhinitis due to pollen   . Anxiety   . Aortic atherosclerosis (HCC)   . CHF (congestive heart failure) (HCC)   . Coronary artery disease    Stent in 2000's (DC)  . Dyspnea   . GERD (gastroesophageal reflux disease)   . Hard of hearing   . Hypertension   . Neuropathy   . Obstructive sleep apnea   . Osteoarthritis of both knees   . Peripheral vascular disease (HCC)   . Sarcoidosis   . Thyroid nodule    Past Surgical History:  Procedure Laterality Date  . BIOPSY THYROID  2014   Dr Andee PolesVaught  . BREAST BIOPSY Left    surgical bx neg  . CARDIOVASCULAR STRESS TEST  05/12/2012   with dobutamine---negative. Normal LV function  . CORONARY ANGIOPLASTY  2006  . JOINT REPLACEMENT Left 2000   hip  . JOINT REPLACEMENT Right 2007   hip  . KNEE ARTHROPLASTY Right 06/23/2017   Procedure: COMPUTER ASSISTED TOTAL KNEE ARTHROPLASTY;  Surgeon: Donato HeinzHooten, James P, MD;  Location: [facility]  ORS;  Service: Orthopedics;  Laterality: Right;   No family history on file. Social History  Substance Use Topics  . Smoking status: Never Smoker  . Smokeless tobacco: Never Used  . Alcohol use No   Allergies  Allergen Reactions  . Tramadol Other (See Comments)    CONFUSION  . Sulfa Antibiotics     Pt dont remember reaction  . Zocor [Simvastatin] Other (See Comments)    INTOLERANCE-MYALGIAS    Prior to Admission medications   Medication Sig Start Date End Date Taking? Authorizing Provider  acetaminophen (TYLENOL) 650 MG CR tablet Take 650 mg by mouth every 8 (eight) hours as needed for pain.    Yes [provider]  amLODipine (NORVASC) 5 MG tablet TAKE 1 TABLET (5 MG TOTAL) BY MOUTH DAILY. 04/17/17  Yes Karie SchwalbeLetvak, Richard I, MD  furosemide (LASIX) 20 MG tablet Take 1 tablet (20 mg total) by mouth daily. 10/21/17 10/21/18 Yes Rifenbark, Lloyd HugerNeil, MD  latanoprost (XALATAN) 0.005 % ophthalmic solution Place 1 drop into both eyes at bedtime. 05/30/17  Yes [provider]  metoprolol succinate (TOPROL-XL) 50 MG 24 hr tablet Take 100 mg by mouth daily. Take with or immediately following a meal.    Yes [provider]  pantoprazole (PROTONIX) 40 MG tablet Take 40 mg by mouth daily.    Yes [provider]  ranitidine (  ZANTAC) 150 MG tablet Take 150 mg by mouth 2 (two) times daily as needed for heartburn.    Yes [provider]    Review of Systems  Constitutional: Positive for fatigue. Negative for appetite change.  HENT: Negative for congestion, rhinorrhea and sore throat.   Eyes: Negative.   Respiratory: Positive for shortness of breath. Negative for cough and chest tightness.   Cardiovascular: Negative for chest pain, palpitations and leg swelling.  Gastrointestinal: Negative for abdominal distention and abdominal pain.  Endocrine: Negative.   Genitourinary: Negative.   Musculoskeletal: Positive for arthralgias (bilateral hip pain). Negative for  back pain.  Skin: Negative.   Allergic/Immunologic: Negative.   Neurological: Positive for light-headedness and numbness ("burning bottom of feet"). Negative for dizziness.  Hematological: Negative for adenopathy. Does not bruise/bleed easily.  Psychiatric/Behavioral: Negative for dysphoric mood and sleep disturbance. The patient is nervous/anxious.    Vitals:   10/27/17 1457  BP: (!) 159/67  Pulse: 69  Resp: 18  SpO2: 98%  Weight: 211 lb 4 oz (95.8 kg)  Height: 5\' 4"  (1.626 m)   Wt Readings from Last 3 Encounters:  10/27/17 211 lb 4 oz (95.8 kg)  10/21/17 210 lb (95.3 kg)  08/25/17 207 lb 6.4 oz (94.1 kg)   Lab Results  Component Value Date   CREATININE 0.94 10/21/2017   CREATININE 0.84 06/25/2017   CREATININE 1.04 (H) 06/24/2017    Physical Exam  Constitutional: She is oriented to person, place, and time. She appears well-developed and well-nourished.  HENT:  Head: Normocephalic and atraumatic.  Neck: Normal range of motion. Neck supple. No JVD present.  Cardiovascular: Normal rate and regular rhythm.   Pulmonary/Chest: Effort normal. She has no wheezes. She has no rales.  Abdominal: Soft. Bowel sounds are normal.  Musculoskeletal: She exhibits edema (trace). She exhibits no tenderness.  Neurological: She is alert and oriented to person, place, and time.  Skin: Skin is warm and dry.  Psychiatric: She has a normal mood and affect. Her behavior is normal. Thought content normal.  Nursing note and vitals reviewed.  Assessment & Plan:  1: Chronic heart failure with preserved ejection fraction- - NYHA class III - euvolemic at this time - already weighing daily and says that her weight has been stable. Instructed to call for an overnight weight gain of >2 pounds or a weekly weight gain of >5 pounds - not adding salt to her food and she does read food labels sometimes. Discussed the importance of closely following a 2000mg  sodium diet and written dietary information was  give to her - encouraged her to elevate her legs as much as possible when she's sitting for long periods of time - patient reports receiving her pneumonia vaccine but does not take the flu vaccine - saw cardiologist (Paraschos) 10/23/17 - does report feeling quite fatigued so will stop her amlodipine and increase her metoprolol to 150mg  daily   2: HTN- - BP mildly elevated today - BMP from 10/21/17 reviewed and shows a sodium 141, potassium 4.0 and GFR 60 - sees PCP (Letvak) next week  3: Anxiety- - has taken clonazepam on occasion to help with her anxiety - explained that she would need to follow-up with her PCP regarding any continuation of this medication  Patient did not bring her medications nor a list. Each medication was verbally reviewed with the patient and she was encouraged to bring the bottles to every visit to confirm accuracy of list.  Return in 1 month or sooner  for any questions/problems before then.

## 2017-10-29 DIAGNOSIS — F419 Anxiety disorder, unspecified: Secondary | ICD-10-CM | POA: Insufficient documentation

## 2017-10-29 DIAGNOSIS — I5032 Chronic diastolic (congestive) heart failure: Secondary | ICD-10-CM | POA: Insufficient documentation

## 2017-10-29 DIAGNOSIS — I5033 Acute on chronic diastolic (congestive) heart failure: Secondary | ICD-10-CM | POA: Insufficient documentation

## 2017-10-30 ENCOUNTER — Encounter: Payer: Self-pay | Admitting: Internal Medicine

## 2017-10-30 ENCOUNTER — Ambulatory Visit (INDEPENDENT_AMBULATORY_CARE_PROVIDER_SITE_OTHER): Payer: PPO | Admitting: Internal Medicine

## 2017-10-30 VITALS — BP 138/86 | HR 77 | Temp 98.1°F | Wt 210.0 lb

## 2017-10-30 DIAGNOSIS — R7303 Prediabetes: Secondary | ICD-10-CM | POA: Diagnosis not present

## 2017-10-30 DIAGNOSIS — I5032 Chronic diastolic (congestive) heart failure: Secondary | ICD-10-CM

## 2017-10-30 DIAGNOSIS — F39 Unspecified mood [affective] disorder: Secondary | ICD-10-CM

## 2017-10-30 DIAGNOSIS — G629 Polyneuropathy, unspecified: Secondary | ICD-10-CM | POA: Diagnosis not present

## 2017-10-30 LAB — HEMOGLOBIN A1C: Hgb A1c MFr Bld: 6.4 % (ref 4.6–6.5)

## 2017-10-30 MED ORDER — METOPROLOL SUCCINATE ER 50 MG PO TB24: 150.0000 mg | ORAL_TABLET | Freq: Every day | ORAL | 3 refills | Status: DC

## 2017-10-30 MED ORDER — CLONAZEPAM 0.5 MG PO TABS: 0.2500 mg | ORAL_TABLET | Freq: Two times a day (BID) | ORAL | 0 refills | Status: DC | PRN

## 2017-10-30 NOTE — Assessment & Plan Note (Signed)
Discussed---will hold off on meds

## 2017-10-30 NOTE — Assessment & Plan Note (Signed)
Now taking furosemide daily Asked her to monitor weight and folllow up with CHF clinic

## 2017-10-30 NOTE — Assessment & Plan Note (Signed)
Intermittent anxiety Will refill the clonazepam for rare use

## 2017-10-30 NOTE — Assessment & Plan Note (Signed)
Reviewed her sugars Doesn't seem diabetic--and even if was, would be hesitant to treat at her age (did have metformin briefly in past)

## 2017-10-30 NOTE — Progress Notes (Signed)
Subjective:    Patient ID: Sheila Long, female    DOB: Aug 15, 1924, 83 y.o.   MRN: [id number]  HPI Here for ER follow up  Had been coming in from the drug store--went up her 14 steps to go into her house Got SOB so drove to ER No chest pain Thinks she could have avoided ER if she had her clonazepam  Occasional palpitations---if walks more (with SOB) Dizzy, blurred vision at times. Mostly if she just gets up  Has chronic DOE It was worse the day she went to ER--otherwise stable  Sleeps okay in general--1 recent sleepness night 2 pillows in bed Awakens--- but not with PND Uses CPAP intermittently  Current Outpatient Prescriptions on File Prior to Visit  Medication Sig Dispense Refill  . acetaminophen (TYLENOL) 650 MG CR tablet Take 650 mg by mouth every 8 (eight) hours as needed for pain.     . furosemide (LASIX) 20 MG tablet Take 1 tablet (20 mg total) by mouth daily. 30 tablet 0  . latanoprost (XALATAN) 0.005 % ophthalmic solution Place 1 drop into both eyes at bedtime.  5  . metoprolol succinate (TOPROL-XL) 50 MG 24 hr tablet Take 150 mg by mouth daily. Take with or immediately following a meal.     . pantoprazole (PROTONIX) 40 MG tablet Take 40 mg by mouth daily.     . ranitidine (ZANTAC) 150 MG tablet Take 150 mg by mouth daily.     . clonazePAM (KLONOPIN) 0.5 MG tablet Take 0.5 mg by mouth as needed for anxiety.     No current facility-administered medications on file prior to visit.     Allergies  Allergen Reactions  . Tramadol Other (See Comments)    CONFUSION  . Sulfa Antibiotics     Pt dont remember reaction  . Zocor [Simvastatin] Other (See Comments)    INTOLERANCE-MYALGIAS     Past Medical History:  Diagnosis Date  . Allergic rhinitis due to pollen   . Anxiety   . Aortic atherosclerosis (HCC)   . CHF (congestive heart failure) (HCC)   . Coronary artery disease    Stent in 2000's (DC)  . Dyspnea   . GERD (gastroesophageal reflux disease)   .  Hard of hearing   . Hypertension   . Neuropathy   . Obstructive sleep apnea   . Osteoarthritis of both knees   . Peripheral vascular disease (HCC)   . Sarcoidosis   . Thyroid nodule     Past Surgical History:  Procedure Laterality Date  . BIOPSY THYROID  2014   Dr Andee Poles  . BREAST BIOPSY Left    surgical bx neg  . CARDIOVASCULAR STRESS TEST  05/12/2012   with dobutamine---negative. Normal LV function  . CORONARY ANGIOPLASTY  2006  . JOINT REPLACEMENT Left 2000   hip  . JOINT REPLACEMENT Right 2007   hip  . KNEE ARTHROPLASTY Right 06/23/2017   Procedure: COMPUTER ASSISTED TOTAL KNEE ARTHROPLASTY;  Surgeon: Donato Heinz, MD;  Location: [facility] ORS;  Service: Orthopedics;  Laterality: Right;    No family history on file.  Social History   Social History  . Marital status: Widowed    Spouse name: N/A  . Number of children: 0  . Years of education: N/A   Occupational History  . 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 Smoking status: Never Smoker  . Smokeless tobacco: Never Used  . Alcohol use No  .  Drug use: No  . Sexual activity: Not on file   Other Topics Concern  . Not on file   Social History Narrative   Past living will   Needs to redo health care POA   Requests sister [employer] as decision makers   Would accept resuscitation   Not sure about tube feeds   Review of Systems  Uses cane out of house---occasionally in the house Has been checking her sugars--- 137 this morning Generally had been under 120 Diagnosed with diabetes or prediabetes in past Burning in feet at times Weighs intermittently      Objective:   Physical Exam  Constitutional: No distress.  Neck: No thyromegaly present.  Cardiovascular: Normal rate, regular rhythm and normal heart sounds.  Exam reveals no gallop.   No murmur heard. Pulmonary/Chest: Effort normal and breath sounds normal. No respiratory distress. She has no wheezes. She has no  rales.  Musculoskeletal: She exhibits no edema.  Lymphadenopathy:    She has no cervical adenopathy.  Psychiatric: She has a normal mood and affect. Her behavior is normal.          Assessment & Plan:

## 2017-11-18 DIAGNOSIS — H401131 Primary open-angle glaucoma, bilateral, mild stage: Secondary | ICD-10-CM | POA: Diagnosis not present

## 2017-11-24 ENCOUNTER — Encounter: Payer: Self-pay | Admitting: Family

## 2017-11-24 ENCOUNTER — Other Ambulatory Visit: Payer: Self-pay

## 2017-11-24 ENCOUNTER — Ambulatory Visit: Payer: PPO | Attending: Family | Admitting: Family

## 2017-11-24 VITALS — BP 155/54 | HR 77 | Resp 20 | Ht 64.0 in | Wt 216.2 lb

## 2017-11-24 DIAGNOSIS — D869 Sarcoidosis, unspecified: Secondary | ICD-10-CM | POA: Diagnosis not present

## 2017-11-24 DIAGNOSIS — G629 Polyneuropathy, unspecified: Secondary | ICD-10-CM | POA: Diagnosis not present

## 2017-11-24 DIAGNOSIS — I739 Peripheral vascular disease, unspecified: Secondary | ICD-10-CM | POA: Diagnosis not present

## 2017-11-24 DIAGNOSIS — Z888 Allergy status to other drugs, medicaments and biological substances status: Secondary | ICD-10-CM | POA: Insufficient documentation

## 2017-11-24 DIAGNOSIS — Z9889 Other specified postprocedural states: Secondary | ICD-10-CM | POA: Insufficient documentation

## 2017-11-24 DIAGNOSIS — K219 Gastro-esophageal reflux disease without esophagitis: Secondary | ICD-10-CM | POA: Diagnosis not present

## 2017-11-24 DIAGNOSIS — I5032 Chronic diastolic (congestive) heart failure: Secondary | ICD-10-CM | POA: Diagnosis not present

## 2017-11-24 DIAGNOSIS — F419 Anxiety disorder, unspecified: Secondary | ICD-10-CM | POA: Insufficient documentation

## 2017-11-24 DIAGNOSIS — Z96651 Presence of right artificial knee joint: Secondary | ICD-10-CM | POA: Insufficient documentation

## 2017-11-24 DIAGNOSIS — I2510 Atherosclerotic heart disease of native coronary artery without angina pectoris: Secondary | ICD-10-CM | POA: Insufficient documentation

## 2017-11-24 DIAGNOSIS — I110 Hypertensive heart disease with heart failure: Secondary | ICD-10-CM | POA: Diagnosis not present

## 2017-11-24 DIAGNOSIS — E079 Disorder of thyroid, unspecified: Secondary | ICD-10-CM | POA: Insufficient documentation

## 2017-11-24 DIAGNOSIS — Z885 Allergy status to narcotic agent status: Secondary | ICD-10-CM | POA: Insufficient documentation

## 2017-11-24 DIAGNOSIS — G4733 Obstructive sleep apnea (adult) (pediatric): Secondary | ICD-10-CM | POA: Diagnosis not present

## 2017-11-24 DIAGNOSIS — Z882 Allergy status to sulfonamides status: Secondary | ICD-10-CM | POA: Insufficient documentation

## 2017-11-24 DIAGNOSIS — Z79899 Other long term (current) drug therapy: Secondary | ICD-10-CM | POA: Diagnosis not present

## 2017-11-24 DIAGNOSIS — I10 Essential (primary) hypertension: Secondary | ICD-10-CM

## 2017-11-24 NOTE — Progress Notes (Signed)
Patient ID: Sheila Long, female    DOB: [birth year], 83 y.o.   MRN: [id number]  HPI  Ms Sheila Long is a 83 y/o female with a history of GERD, PVD, obstructive sleep apnea (CPAP), anxiety, HTN, thyroid disease, CAD and chronic heart failure.   Echo done 11/28/16 reviewed and showed an EF of 55-60% along with mild AR/TR and moderate MR. There was also mildly increased PA pressure of 38 mm Hg.  Was in the ED 10/21/17 due to shortness of breath. CT angiogram was negative and she was treated and released. Was admitted 06/23/17 for a right knee replacement. Discharged after 2 days.   She presents today for a follow-up visit with a chief complaint of moderate fatigue upon minimal exertion. She describes this as chronic in nature having been present for several years with varying levels of severity. She has associated fatigue, edema, light- headedness, anxiety and gradual weight gain along with this. She denies any chest pain, palpitations or difficulty sleeping. She hasn't taken her furosemide yet today since she was coming to the appointment today and she's planning on taking it once she gets home.   Past Medical History:  Diagnosis Date  . Allergic rhinitis due to pollen   . Anxiety   . Aortic atherosclerosis (HCC)   . CHF (congestive heart failure) (HCC)   . Coronary artery disease    Stent in 2000's (DC)  . Dyspnea   . GERD (gastroesophageal reflux disease)   . Hard of hearing   . Hypertension   . Neuropathy   . Obstructive sleep apnea   . Osteoarthritis of both knees   . Peripheral vascular disease (HCC)   . Sarcoidosis   . Thyroid nodule    Past Surgical History:  Procedure Laterality Date  . BIOPSY THYROID  2014   Dr Andee PolesVaught  . BREAST BIOPSY Left    surgical bx neg  . CARDIOVASCULAR STRESS TEST  05/12/2012   with dobutamine---negative. Normal LV function  . CORONARY ANGIOPLASTY  2006  . JOINT REPLACEMENT Left 2000   hip  . JOINT REPLACEMENT Right 2007   hip  . KNEE  ARTHROPLASTY Right 06/23/2017   Procedure: COMPUTER ASSISTED TOTAL KNEE ARTHROPLASTY;  Surgeon: Donato HeinzHooten, James P, MD;  Location: [facility] ORS;  Service: Orthopedics;  Laterality: Right;   No family history on file. Social History   Tobacco Use  . Smoking status: Never Smoker  . Smokeless tobacco: Never Used  Substance Use Topics  . Alcohol use: No   Allergies  Allergen Reactions  . Tramadol Other (See Comments)    CONFUSION  . Sulfa Antibiotics     Pt dont remember reaction  . Zocor [Simvastatin] Other (See Comments)    INTOLERANCE-MYALGIAS    Prior to Admission medications   Medication Sig Start Date End Date Taking? Authorizing Provider  acetaminophen (TYLENOL) 650 MG CR tablet Take 650 mg by mouth every 8 (eight) hours as needed for pain.    Yes [provider]  furosemide (LASIX) 20 MG tablet Take 1 tablet (20 mg total) by mouth daily. 10/21/17 10/21/18 Yes Rifenbark, Lloyd HugerNeil, MD  latanoprost (XALATAN) 0.005 % ophthalmic solution Place 1 drop into both eyes at bedtime. 05/30/17  Yes [provider]  metoprolol succinate (TOPROL-XL) 50 MG 24 hr tablet Take 3 tablets (150 mg total) by mouth daily. Take with or immediately following a meal. 10/30/17  Yes Tillman AbideLetvak, Richard I, MD  pantoprazole (PROTONIX) 40 MG tablet Take 40 mg by mouth daily.  Yes [provider]  ranitidine (ZANTAC) 150 MG tablet Take 150 mg by mouth daily.    Yes [provider]  clonazePAM (KLONOPIN) 0.5 MG tablet Take 0.5-1 tablets (0.25-0.5 mg total) by mouth 2 (two) times daily as needed for anxiety. Patient not taking: Reported on 11/24/2017 10/30/17   Karie SchwalbeLetvak, Richard I, MD   Review of Systems  Constitutional: Positive for fatigue. Negative for appetite change.  HENT: Negative for congestion, rhinorrhea and sore throat.   Eyes: Negative.   Respiratory: Positive for shortness of breath. Negative for cough and chest tightness.   Cardiovascular: Positive for leg swelling (hasn't taken  lasix yet today). Negative for chest pain and palpitations.  Gastrointestinal: Negative for abdominal distention and abdominal pain.  Endocrine: Negative.   Genitourinary: Negative.   Musculoskeletal: Positive for arthralgias (bilateral hip pain). Negative for back pain.  Skin: Negative.   Allergic/Immunologic: Negative.   Neurological: Positive for light-headedness (first thing in the morning) and numbness ("burning bottom of feet"). Negative for dizziness.  Hematological: Negative for adenopathy. Does not bruise/bleed easily.  Psychiatric/Behavioral: Negative for dysphoric mood and sleep disturbance. The patient is nervous/anxious.    Vitals:   11/24/17 1435  BP: (!) 155/54  Pulse: 77  Resp: 20  SpO2: 94%  Weight: 216 lb 4 oz (98.1 kg)  Height: 5\' 4"  (1.626 m)   Wt Readings from Last 3 Encounters:  11/24/17 216 lb 4 oz (98.1 kg)  10/30/17 210 lb (95.3 kg)  10/27/17 211 lb 4 oz (95.8 kg)    Lab Results  Component Value Date   CREATININE 0.94 10/21/2017   CREATININE 0.84 06/25/2017   CREATININE 1.04 (H) 06/24/2017    Physical Exam  Constitutional: She is oriented to person, place, and time. She appears well-developed and well-nourished.  HENT:  Head: Normocephalic and atraumatic.  Neck: Normal range of motion. Neck supple. No JVD present.  Cardiovascular: Normal rate and regular rhythm.  Pulmonary/Chest: Effort normal. She has no wheezes. She has no rales.  Abdominal: Soft. Bowel sounds are normal.  Musculoskeletal: She exhibits edema (1+ pitting edema around bilateral ankles). She exhibits no tenderness.  Neurological: She is alert and oriented to person, place, and time.  Skin: Skin is warm and dry.  Psychiatric: She has a normal mood and affect. Her behavior is normal. Thought content normal.  Nursing note and vitals reviewed.  Assessment & Plan:  1: Chronic heart failure with preserved ejection fraction- - NYHA class III - euvolemic at this time - weighing  daily and says that her weight has been stable. Instructed to call for an overnight weight gain of >2 pounds or a weekly weight gain of >5 pounds - weight up 5 pounds by our scale - not adding salt to her food and she does read food labels sometimes. Discussed the importance of closely following a 2000mg  sodium diet  - encouraged her to elevate her legs as much as possible when she's sitting for long periods of time - hasn't taken her furosemide yet today but is planning on taking it once she gets home - patient reports receiving her pneumonia vaccine but does not take the flu vaccine - saw cardiologist (Paraschos) 10/23/17 and returns January 2019  2: HTN- - BP mildly elevated today but she hasn't taken her diuretic yet today - BMP from 10/21/17 reviewed and shows a sodium 141, potassium 4.0 and GFR 60 - saw PCP Alphonsus Sias(Letvak) 10/30/17  3: Anxiety- - has taken clonazepam on occasion to help with her  anxiety - she says that she's talked with her PCP about this  Patient did not bring her medications nor a list. Each medication was verbally reviewed with the patient and she was encouraged to bring the bottles to every visit to confirm accuracy of list.  Patient opts to not make a return appointment at this time.

## 2017-11-24 NOTE — Patient Instructions (Signed)
Continue weighing daily and call for an overnight weight gain of > 2 pounds or a weekly weight gain of >5 pounds. 

## 2018-01-06 DIAGNOSIS — Z96651 Presence of right artificial knee joint: Secondary | ICD-10-CM | POA: Diagnosis not present

## 2018-01-20 DIAGNOSIS — R0602 Shortness of breath: Secondary | ICD-10-CM | POA: Diagnosis not present

## 2018-01-20 DIAGNOSIS — R0609 Other forms of dyspnea: Secondary | ICD-10-CM | POA: Diagnosis not present

## 2018-01-20 DIAGNOSIS — E785 Hyperlipidemia, unspecified: Secondary | ICD-10-CM | POA: Diagnosis not present

## 2018-01-20 DIAGNOSIS — D869 Sarcoidosis, unspecified: Secondary | ICD-10-CM | POA: Diagnosis not present

## 2018-01-20 DIAGNOSIS — I2510 Atherosclerotic heart disease of native coronary artery without angina pectoris: Secondary | ICD-10-CM | POA: Diagnosis not present

## 2018-01-20 DIAGNOSIS — I10 Essential (primary) hypertension: Secondary | ICD-10-CM | POA: Diagnosis not present

## 2018-01-20 DIAGNOSIS — I2729 Other secondary pulmonary hypertension: Secondary | ICD-10-CM | POA: Diagnosis not present

## 2018-02-02 DIAGNOSIS — H903 Sensorineural hearing loss, bilateral: Secondary | ICD-10-CM | POA: Diagnosis not present

## 2018-02-02 DIAGNOSIS — H6123 Impacted cerumen, bilateral: Secondary | ICD-10-CM | POA: Diagnosis not present

## 2018-02-02 DIAGNOSIS — R69 Illness, unspecified: Secondary | ICD-10-CM | POA: Diagnosis not present

## 2018-02-03 DIAGNOSIS — M25461 Effusion, right knee: Secondary | ICD-10-CM | POA: Diagnosis not present

## 2018-02-03 DIAGNOSIS — S76119A Strain of unspecified quadriceps muscle, fascia and tendon, initial encounter: Secondary | ICD-10-CM | POA: Diagnosis not present

## 2018-02-03 DIAGNOSIS — F341 Dysthymic disorder: Secondary | ICD-10-CM | POA: Diagnosis not present

## 2018-02-03 DIAGNOSIS — M130 Polyarthritis, unspecified: Secondary | ICD-10-CM | POA: Diagnosis not present

## 2018-02-04 DIAGNOSIS — E7849 Other hyperlipidemia: Secondary | ICD-10-CM | POA: Diagnosis not present

## 2018-02-04 DIAGNOSIS — I10 Essential (primary) hypertension: Secondary | ICD-10-CM | POA: Diagnosis not present

## 2018-02-04 DIAGNOSIS — R5381 Other malaise: Secondary | ICD-10-CM | POA: Diagnosis not present

## 2018-02-19 DIAGNOSIS — R259 Unspecified abnormal involuntary movements: Secondary | ICD-10-CM | POA: Diagnosis not present

## 2018-02-19 DIAGNOSIS — N183 Chronic kidney disease, stage 3 (moderate): Secondary | ICD-10-CM | POA: Diagnosis not present

## 2018-02-19 DIAGNOSIS — E8881 Metabolic syndrome: Secondary | ICD-10-CM | POA: Diagnosis not present

## 2018-02-19 DIAGNOSIS — D869 Sarcoidosis, unspecified: Secondary | ICD-10-CM | POA: Diagnosis not present

## 2018-03-23 ENCOUNTER — Encounter: Payer: Self-pay | Admitting: *Deleted

## 2018-04-09 DIAGNOSIS — M179 Osteoarthritis of knee, unspecified: Secondary | ICD-10-CM | POA: Diagnosis not present

## 2018-04-09 DIAGNOSIS — M25461 Effusion, right knee: Secondary | ICD-10-CM | POA: Diagnosis not present

## 2018-04-09 DIAGNOSIS — D696 Thrombocytopenia, unspecified: Secondary | ICD-10-CM | POA: Diagnosis not present

## 2018-04-09 DIAGNOSIS — N183 Chronic kidney disease, stage 3 (moderate): Secondary | ICD-10-CM | POA: Diagnosis not present

## 2018-04-09 DIAGNOSIS — E119 Type 2 diabetes mellitus without complications: Secondary | ICD-10-CM | POA: Diagnosis not present

## 2018-04-16 ENCOUNTER — Ambulatory Visit (INDEPENDENT_AMBULATORY_CARE_PROVIDER_SITE_OTHER): Payer: PPO | Admitting: General Surgery

## 2018-04-16 ENCOUNTER — Encounter: Payer: Self-pay | Admitting: General Surgery

## 2018-04-16 VITALS — BP 142/70 | HR 81 | Resp 18 | Ht 64.0 in | Wt 213.0 lb

## 2018-04-16 DIAGNOSIS — L729 Follicular cyst of the skin and subcutaneous tissue, unspecified: Secondary | ICD-10-CM | POA: Diagnosis not present

## 2018-04-16 NOTE — Patient Instructions (Addendum)
The patient is aware to call back for any questions or new concerns.  Encouraged her not to squeeze the skin cyst, she is to call the office if it becomes symptomatic or if she desires to have it removed.

## 2018-04-16 NOTE — Progress Notes (Signed)
Patient ID: Sheila Long, female   DOB: May 04, 1924, 84 y.o.   MRN: [id number]  Chief Complaint  Patient presents with  . Cyst    HPI Sheila Long is a 84 y.o. female.  Here for evaluation of a cyst in her right arm pit area. Former patient of Dr Evette Cristal last seen in 2014. She states it has been there since 2014. She states it has not changed in size but a little discomfort. She states it does drain on occasion when she squeezes it during her shower. She states that has an odor.  HPI  Past Medical History:  Diagnosis Date  . Allergic rhinitis due to pollen   . Anxiety   . Aortic atherosclerosis (HCC)   . CHF (congestive heart failure) (HCC)   . Coronary artery disease    Stent in 2000's (DC)  . Dyspnea   . GERD (gastroesophageal reflux disease)   . Hard of hearing   . Hypertension   . Neuropathy   . Obstructive sleep apnea   . Osteoarthritis of both knees   . Peripheral vascular disease (HCC)   . Sarcoidosis   . Thyroid nodule     Past Surgical History:  Procedure Laterality Date  . ABDOMINAL HYSTERECTOMY    . BIOPSY THYROID  2014   Dr Andee Poles  . BREAST BIOPSY Left    surgical bx neg  . CARDIOVASCULAR STRESS TEST  05/12/2012   with dobutamine---negative. Normal LV function  . CORONARY ANGIOPLASTY  2006  . JOINT REPLACEMENT Left 2000   hip  . JOINT REPLACEMENT Right 2007   hip  . KNEE ARTHROPLASTY Right 06/23/2017   Procedure: COMPUTER ASSISTED TOTAL KNEE ARTHROPLASTY;  Surgeon: Donato Heinz, MD;  Location: [facility] ORS;  Service: Orthopedics;  Laterality: Right;    History reviewed. No pertinent family history.  Social History Social History   Tobacco Use  . Smoking status: Never Smoker  . Smokeless tobacco: Never Used  Substance Use Topics  . Alcohol use: No  . Drug use: No    Allergies  Allergen Reactions  . Tramadol Other (See Comments)    CONFUSION  . Sulfa Antibiotics     Pt dont remember reaction  . Zocor [Simvastatin] Other (See  Comments)    INTOLERANCE-MYALGIAS     Current Outpatient Medications  Medication Sig Dispense Refill  . acetaminophen (TYLENOL) 650 MG CR tablet Take 650 mg by mouth every 8 (eight) hours as needed for pain.     Marland Kitchen amLODipine (NORVASC) 5 MG tablet Take 5 mg by mouth daily.  11  . aspirin EC 81 MG tablet Take 81 mg by mouth every other day.    . furosemide (LASIX) 20 MG tablet Take 1 tablet (20 mg total) by mouth daily. 30 tablet 0  . latanoprost (XALATAN) 0.005 % ophthalmic solution Place 1 drop into both eyes at bedtime.  5  . metoprolol succinate (TOPROL-XL) 50 MG 24 hr tablet Take 3 tablets (150 mg total) by mouth daily. Take with or immediately following a meal. 270 tablet 3  . ranitidine (ZANTAC) 150 MG tablet Take 150 mg by mouth daily.      No current facility-administered medications for this visit.     Review of Systems Review of Systems  Constitutional: Negative.   Respiratory: Negative.   Cardiovascular: Negative.     Blood pressure (!) 142/70, pulse 81, resp. rate 18, height 5\' 4"  (1.626 m), weight 213 lb (96.6 kg).  Physical Exam Physical Exam  Constitutional: She  is oriented to person, place, and time. She appears well-developed and well-nourished.  HENT:  Mouth/Throat: Oropharynx is clear and moist.  Eyes: Conjunctivae are normal. No scleral icterus.  Neck: Neck supple.  Pulmonary/Chest:    Lymphadenopathy:    She has no cervical adenopathy.  Neurological: She is alert and oriented to person, place, and time.  Skin: Skin is warm and dry.  1 x 1.5 cm skin cyst right axilla   Psychiatric: Her behavior is normal.    Data Reviewed Area previously measured at 1 cm 5 years ago.  Assessment    Noninflamed sebaceous cyst.    Plan    Encouraged her not to squeeze the skin cyst, she is to call the office if it becomes symptomatic or if she desires to have it removed.      HPI, Physical Exam, Assessment and Plan have been scribed under the direction and  in the presence of Earline MayotteJeffrey W. Byrnett, MD. Dorathy DaftMarsha [name], RN  I have completed the exam and reviewed the above documentation for accuracy and completeness.  I agree with the above.  [employer] has been used and any errors in dictation or transcription are unintentional.  Donnalee CurryJeffrey Byrnett, M.D., F.A.C.S.  Merrily PewJeffrey W Byrnett 04/16/2018, 2:35 PM

## 2018-04-23 ENCOUNTER — Other Ambulatory Visit: Payer: Self-pay | Admitting: Internal Medicine

## 2018-04-23 DIAGNOSIS — Z1231 Encounter for screening mammogram for malignant neoplasm of breast: Secondary | ICD-10-CM

## 2018-05-11 ENCOUNTER — Encounter: Payer: PPO | Admitting: Internal Medicine

## 2018-05-13 ENCOUNTER — Emergency Department: Payer: PPO

## 2018-05-13 ENCOUNTER — Emergency Department
Admission: EM | Admit: 2018-05-13 | Discharge: 2018-05-13 | Disposition: A | Discharge status: Home / Self Care | Payer: PPO | Attending: Emergency Medicine | Admitting: Emergency Medicine

## 2018-05-13 DIAGNOSIS — Z96643 Presence of artificial hip joint, bilateral: Secondary | ICD-10-CM | POA: Diagnosis not present

## 2018-05-13 DIAGNOSIS — Z79899 Other long term (current) drug therapy: Secondary | ICD-10-CM | POA: Insufficient documentation

## 2018-05-13 DIAGNOSIS — Z96651 Presence of right artificial knee joint: Secondary | ICD-10-CM | POA: Insufficient documentation

## 2018-05-13 DIAGNOSIS — Z7982 Long term (current) use of aspirin: Secondary | ICD-10-CM | POA: Diagnosis not present

## 2018-05-13 DIAGNOSIS — I2510 Atherosclerotic heart disease of native coronary artery without angina pectoris: Secondary | ICD-10-CM | POA: Insufficient documentation

## 2018-05-13 DIAGNOSIS — R609 Edema, unspecified: Secondary | ICD-10-CM

## 2018-05-13 DIAGNOSIS — R0789 Other chest pain: Secondary | ICD-10-CM | POA: Diagnosis not present

## 2018-05-13 DIAGNOSIS — I5032 Chronic diastolic (congestive) heart failure: Secondary | ICD-10-CM | POA: Insufficient documentation

## 2018-05-13 DIAGNOSIS — Z955 Presence of coronary angioplasty implant and graft: Secondary | ICD-10-CM | POA: Insufficient documentation

## 2018-05-13 DIAGNOSIS — R600 Localized edema: Secondary | ICD-10-CM | POA: Diagnosis not present

## 2018-05-13 DIAGNOSIS — I110 Hypertensive heart disease with heart failure: Secondary | ICD-10-CM | POA: Diagnosis not present

## 2018-05-13 DIAGNOSIS — R079 Chest pain, unspecified: Secondary | ICD-10-CM | POA: Diagnosis not present

## 2018-05-13 DIAGNOSIS — R0602 Shortness of breath: Secondary | ICD-10-CM | POA: Diagnosis not present

## 2018-05-13 LAB — BASIC METABOLIC PANEL
Anion gap: 6 (ref 5–15)
BUN: 14 mg/dL (ref 6–20)
CO2: 24 mmol/L (ref 22–32)
Calcium: 9 mg/dL (ref 8.9–10.3)
Chloride: 108 mmol/L (ref 101–111)
Creatinine, Ser: 0.89 mg/dL (ref 0.44–1.00)
GFR calc Af Amer: >60 mL/min (ref >60)
GFR calc non Af Amer: 55 mL/min — ABNORMAL LOW (ref >60)
Glucose, Bld: 98 mg/dL (ref 65–99)
Potassium: 3.9 mmol/L (ref 3.5–5.1)
Sodium: 138 mmol/L (ref 135–145)

## 2018-05-13 LAB — CBC
HCT: 42 % (ref 35.0–47.0)
Hemoglobin: 14.1 g/dL (ref 12.0–16.0)
MCH: 33.6 pg (ref 26.0–34.0)
MCHC: 33.6 g/dL (ref 32.0–36.0)
MCV: 100.2 fL — ABNORMAL HIGH (ref 80.0–100.0)
Platelets: 178 10*3/uL (ref 150–440)
RBC: 4.19 MIL/uL (ref 3.80–5.20)
RDW: 15 % — ABNORMAL HIGH (ref 11.5–14.5)
WBC: 6.4 10*3/uL (ref 3.6–11.0)

## 2018-05-13 LAB — TROPONIN I
Troponin I: <0.03 ng/mL (ref <0.03)
Troponin I: <0.03 ng/mL (ref <0.03)

## 2018-05-13 NOTE — Discharge Instructions (Signed)
Continue all your home medications as currently prescribed, except you should increase your lasix every other day.  Alternate between taking  and  each day, and follow up with the heart failure clinic and your cardiologist for continued monitoring of your symptoms.

## 2018-05-13 NOTE — ED Notes (Signed)
Pt reports CP x 1 year intermittently. When pain occurs it happens in central chest or left chest. C/o L foot swelling that began yesterday. Hx CHF, takes Lasix "when I remember to." admits to not taking it every day. Also reports SOB when moving around, denies SOB while lying flat. Pt is alert, oriented, ambulatory with cane. No distress noted at this time. No CP at this time.

## 2018-05-13 NOTE — ED Provider Notes (Signed)
[facility] Emergency Department Provider Note  ____________________________________________  Time seen: Approximately 9:36 PM  I have reviewed the triage vital signs and the nursing notes.   HISTORY  Chief Complaint Chest Pain and [name]    HPI Sheila Long is a 84 y.o. female who complains of some intermittent shortness of breath that she notices when she goes up a flight of stairs. She reports that this is been going on for at least a year and is not changed and not worse. She also reports having some intermittent chest pain over the last 24 hours. Not exertional, not pleuritic, nonradiating, no aggravating or alleviating factors. Described as sharp and lasting a few seconds at a time in the center of the chest.this also has been going on for at least a year according to the patient.  her main complaint today of what has changed is that today she has noticed that her left foot is slightly more swollen than it usually is. This foot does swell preferentially whenever she is having peripheral edema. She does have dietary noncompliance, ate a bag of Fritos in the waiting room here. Reports that she sometimes forgets to take her medicines.  she has a follow-up appointment with her cardiologist Dr. Darrold Junker in 5 days   Past Medical History:  Diagnosis Date  . Allergic rhinitis due to pollen   . Anxiety   . Aortic atherosclerosis (HCC)   . CHF (congestive heart failure) (HCC)   . Coronary artery disease    Stent in 2000's (DC)  . Dyspnea   . GERD (gastroesophageal reflux disease)   . Hard of hearing   . Hypertension   . Neuropathy   . Obstructive sleep apnea   . Osteoarthritis of both knees   . Peripheral vascular disease (HCC)   . Sarcoidosis   . Thyroid nodule      Patient Active Problem List   Diagnosis Date Noted  . Prediabetes 10/30/2017  . Chronic diastolic heart failure (HCC) 10/29/2017  . Anxiety 10/29/2017  . Fatigue  07/29/2017  . Mood disorder (HCC) 07/29/2017  . S/P total knee arthroplasty 06/23/2017  . Pulmonary hypertension associated with sarcoidosis (HCC) 02/03/2017  . Lower extremity edema 11/28/2016  . Familial tremor 09/24/2016  . Sarcoidosis   . Hypertension   . Allergic rhinitis due to pollen   . Coronary artery disease   . Aortic atherosclerosis (HCC)   . GERD (gastroesophageal reflux disease)   . Peripheral vascular disease (HCC)   . Osteoarthritis of both knees   . Obstructive sleep apnea   . Neuropathy      Past Surgical History:  Procedure Laterality Date  . ABDOMINAL HYSTERECTOMY    . BIOPSY THYROID  2014   Dr Andee Poles  . BREAST BIOPSY Left    surgical bx neg  . CARDIOVASCULAR STRESS TEST  05/12/2012   with dobutamine---negative. Normal LV function  . CORONARY ANGIOPLASTY  2006  . JOINT REPLACEMENT Left 2000   hip  . JOINT REPLACEMENT Right 2007   hip  . KNEE ARTHROPLASTY Right 06/23/2017   Procedure: COMPUTER ASSISTED TOTAL KNEE ARTHROPLASTY;  Surgeon: Donato Heinz, MD;  Location: [facility] ORS;  Service: Orthopedics;  Laterality: Right;     Prior to Admission medications   Medication Sig Start Date End Date Taking? Authorizing Provider  acetaminophen (TYLENOL) 650 MG CR tablet Take 650 mg by mouth every 8 (eight) hours as needed for pain.     [provider]  amLODipine (  NORVASC) 5 MG tablet Take 5 mg by mouth daily. 01/20/18   [provider]  aspirin EC 81 MG tablet Take 81 mg by mouth every other day.    [provider]  furosemide (LASIX) 20 MG tablet Take 1 tablet (20 mg total) by mouth daily. 10/21/17 10/21/18  Merrily Brittle, MD  latanoprost (XALATAN) 0.005 % ophthalmic solution Place 1 drop into both eyes at bedtime. 05/30/17   [provider]  metoprolol succinate (TOPROL-XL) 50 MG 24 hr tablet Take 3 tablets (150 mg total) by mouth daily. Take with or immediately following a meal. 10/30/17   Karie Schwalbe, MD  ranitidine  (ZANTAC) 150 MG tablet Take 150 mg by mouth daily.     [provider]     Allergies Tramadol; Sulfa antibiotics; and Zocor [simvastatin]   No family history on file.  Social History Social History   Tobacco Use  . Smoking status: Never Smoker  . Smokeless tobacco: Never Used  Substance Use Topics  . Alcohol use: No  . Drug use: No    Review of Systems  Constitutional:   No fever or chills.  ENT:   No sore throat. No rhinorrhea. Cardiovascular:   chronic chest pain, unchanged, withoutsyncope. Respiratory:   chronic shortness of breath as above without cough. Gastrointestinal:   Negative for abdominal pain, vomiting and diarrhea.  Musculoskeletal:   left foot swelling All other systems reviewed and are negative except as documented above in ROS and HPI.  ____________________________________________   PHYSICAL EXAM:  VITAL SIGNS: ED Triage Vitals  Enc Vitals Group     BP 05/13/18 1420 (!) 174/91     Pulse Rate 05/13/18 1420 72     Resp 05/13/18 1420 20     Temp 05/13/18 1420 98.2 F (36.8 C)     Temp Source 05/13/18 1420 Oral     SpO2 05/13/18 1420 100 %     Weight 05/13/18 1421 214 lb (97.1 kg)     Height 05/13/18 1421  (1.626 m)     Head Circumference --      Peak Flow --      Pain Score 05/13/18 1420 0     Pain Loc --      Pain Edu? --      Excl. in GC? --     Vital signs reviewed, nursing assessments reviewed.   Constitutional:   Alert and oriented. Well appearing and in no distress.laughing, calm and comfortable. Eyes:   Conjunctivae are normal. EOMI. PERRL. ENT      Head:   Normocephalic and atraumatic.      Nose:   No congestion/rhinnorhea.       Mouth/Throat:   MMM, no pharyngeal erythema. No peritonsillar mass.       Neck:   No meningismus. Full ROM. Hematological/Lymphatic/Immunilogical:   No cervical lymphadenopathy. Cardiovascular:   RRR. Symmetric bilateral radial and DP pulses.  No murmurs.  Respiratory:   Normal  respiratory effort without tachypnea/retractions. Breath sounds are clear and equal bilaterally. No wheezes/rales/rhonchi. Gastrointestinal:   Soft and nontender. Non distended. There is no CVA tenderness.  No rebound, rigidity, or guarding.  Musculoskeletal:   Normal range of motion in all extremities. No joint effusions.  No lower extremity tenderness.  1+ pitting edema of left foot. Symmetric calf circumference, negative Homans sign, no Tenderness. Neurologic:   Normal speech and language.  Motor grossly intact. No acute focal neurologic deficits are appreciated.  Skin:    Skin  is warm, dry and intact. No rash noted.  No petechiae, purpura, or bullae.  ____________________________________________    LABS (pertinent positives/negatives) (all labs ordered are listed, but only abnormal results are displayed) Labs Reviewed  BASIC METABOLIC PANEL - Abnormal; Notable for the following components:      Result Value   GFR calc non Af Amer 55 (*)    All other components within normal limits  CBC - Abnormal; Notable for the following components:   MCV 100.2 (*)    RDW 15.0 (*)    All other components within normal limits  TROPONIN I  TROPONIN I   ____________________________________________   EKG  interpreted by me Normal sinus rhythm rate of 72, normal axis intervals QRS ST segments and T waves. There are voltage criteria for LVH in the high lateral leads. No acute ischemic changes.  ____________________________________________    RADIOLOGY  Dg Chest 2 View  Result Date: 05/13/2018 CLINICAL DATA:  Intermittent chest pain starting yesterday, some shortness of breath, LEFT ankle swelling. EXAM: CHEST - 2 VIEW COMPARISON:  Chest x-rays dated 10/21/2017 and 12/20/2016. Chest CT dated 10/21/2017 FINDINGS: Stable cardiomegaly. Overall cardiomediastinal silhouette is stable. Aortic atherosclerosis. Stable mild interstitial prominence bilaterally. Stable small bilateral pleural effusions.  Hiatal hernia better demonstrated on earlier chest CTs. No acute or suspicious osseous finding. IMPRESSION: 1. No acute findings.  No evidence of pneumonia or pulmonary edema. 2. Stable cardiomegaly. 3. Stable mild interstitial prominence bilaterally, suspected mild chronic CHF. 4. Stable small bilateral pleural effusions. 5. Aortic atherosclerosis. Electronically Signed   By: Bary Richard M.D.   On: 05/13/2018 16:06    ____________________________________________   PROCEDURES Procedures  ____________________________________________  DIFFERENTIAL DIAGNOSIS   non-STEMI, CHF exacerbation, dietary noncompliance, side effects from amlodipine  CLINICAL IMPRESSION / ASSESSMENT AND PLAN / ED COURSE  Pertinent labs & imaging results that were available during my care of the patient were reviewed by me and considered in my medical decision making (see chart for details).      Clinical Course as of May 14 2135  Wed May 13, 2018  1904 Well appearing, c/o chronic intermittent anginal chest pains and dyspnea on exertion, not worse than usual. Not accelerated. Sees cards Dr. Darrold Junker next week. C/o worsening of chronic left foot edema.   [PS]    Clinical Course User Index [PS] Sharman Cheek, MD     ----------------------------------------- 9:37 PM on 05/13/2018 -----------------------------------------  Second troponin negative. Vital signs remain arm unremarkable. Patient remains calm comfortable and in good mood. I'll have her increase her Lasix on every other day, follow up closely with heart failure clinic, follow-up with her cardiologist next week as already scheduled. Her chest and shortness of breath complaints are entirely chronic, ongoing and unchanged for at least a year according to the patient.Considering the patient's symptoms, medical history, and physical examination today, I have low suspicion for ACS, PE, TAD, pneumothorax, carditis, mediastinitis, pneumonia, CHF, or  sepsis.    ____________________________________________   FINAL CLINICAL IMPRESSION(S) / ED DIAGNOSES    Final diagnoses:  Peripheral edema     ED Discharge Orders    None      Portions of this note were generated with dragon dictation software. Dictation errors may occur despite best attempts at proofreading.    Sharman Cheek, MD 05/13/18 2140

## 2018-05-13 NOTE — ED Notes (Signed)
Gave pt peanut butter, crackers, and water. Ok per Dr. Scotty Court.

## 2018-05-13 NOTE — ED Triage Notes (Signed)
Pt reports at this time she is not having CP or SOB.

## 2018-05-13 NOTE — ED Triage Notes (Signed)
Pt reports intermittent CP that started yesterday, some SOB and swelling in her left ankle. Pt reports pain is sharp in nature.

## 2018-05-18 DIAGNOSIS — H401131 Primary open-angle glaucoma, bilateral, mild stage: Secondary | ICD-10-CM | POA: Diagnosis not present

## 2018-05-21 ENCOUNTER — Ambulatory Visit
Admission: RE | Admit: 2018-05-21 | Discharge: 2018-05-21 | Disposition: A | Discharge status: Home / Self Care | Payer: PPO | Source: Ambulatory Visit | Attending: Internal Medicine | Admitting: Internal Medicine

## 2018-05-21 DIAGNOSIS — Z1231 Encounter for screening mammogram for malignant neoplasm of breast: Secondary | ICD-10-CM | POA: Diagnosis not present

## 2018-05-21 DIAGNOSIS — I2729 Other secondary pulmonary hypertension: Secondary | ICD-10-CM | POA: Diagnosis not present

## 2018-05-21 DIAGNOSIS — D869 Sarcoidosis, unspecified: Secondary | ICD-10-CM | POA: Diagnosis not present

## 2018-05-21 DIAGNOSIS — R0609 Other forms of dyspnea: Secondary | ICD-10-CM | POA: Diagnosis not present

## 2018-05-21 DIAGNOSIS — I10 Essential (primary) hypertension: Secondary | ICD-10-CM | POA: Diagnosis not present

## 2018-05-21 DIAGNOSIS — E785 Hyperlipidemia, unspecified: Secondary | ICD-10-CM | POA: Diagnosis not present

## 2018-05-21 DIAGNOSIS — I5032 Chronic diastolic (congestive) heart failure: Secondary | ICD-10-CM | POA: Diagnosis not present

## 2018-05-21 DIAGNOSIS — D860 Sarcoidosis of lung: Secondary | ICD-10-CM | POA: Diagnosis not present

## 2018-05-26 DIAGNOSIS — H401111 Primary open-angle glaucoma, right eye, mild stage: Secondary | ICD-10-CM | POA: Diagnosis not present

## 2018-06-02 DIAGNOSIS — M7050 Other bursitis of knee, unspecified knee: Secondary | ICD-10-CM | POA: Diagnosis not present

## 2018-06-02 DIAGNOSIS — M545 Low back pain: Secondary | ICD-10-CM | POA: Diagnosis not present

## 2018-06-02 DIAGNOSIS — Z96651 Presence of right artificial knee joint: Secondary | ICD-10-CM | POA: Diagnosis not present

## 2018-06-08 ENCOUNTER — Encounter: Payer: Self-pay | Admitting: Family

## 2018-06-08 ENCOUNTER — Ambulatory Visit: Payer: PPO | Attending: Family | Admitting: Family

## 2018-06-08 VITALS — BP 134/72 | HR 68 | Resp 18 | Ht 64.0 in | Wt 217.2 lb

## 2018-06-08 DIAGNOSIS — I110 Hypertensive heart disease with heart failure: Secondary | ICD-10-CM | POA: Insufficient documentation

## 2018-06-08 DIAGNOSIS — I5032 Chronic diastolic (congestive) heart failure: Secondary | ICD-10-CM

## 2018-06-08 DIAGNOSIS — H9190 Unspecified hearing loss, unspecified ear: Secondary | ICD-10-CM | POA: Insufficient documentation

## 2018-06-08 DIAGNOSIS — Z888 Allergy status to other drugs, medicaments and biological substances status: Secondary | ICD-10-CM | POA: Insufficient documentation

## 2018-06-08 DIAGNOSIS — I739 Peripheral vascular disease, unspecified: Secondary | ICD-10-CM | POA: Diagnosis not present

## 2018-06-08 DIAGNOSIS — Z96643 Presence of artificial hip joint, bilateral: Secondary | ICD-10-CM | POA: Diagnosis not present

## 2018-06-08 DIAGNOSIS — E079 Disorder of thyroid, unspecified: Secondary | ICD-10-CM | POA: Diagnosis not present

## 2018-06-08 DIAGNOSIS — Z79899 Other long term (current) drug therapy: Secondary | ICD-10-CM | POA: Diagnosis not present

## 2018-06-08 DIAGNOSIS — I10 Essential (primary) hypertension: Secondary | ICD-10-CM

## 2018-06-08 DIAGNOSIS — Z90710 Acquired absence of both cervix and uterus: Secondary | ICD-10-CM | POA: Diagnosis not present

## 2018-06-08 DIAGNOSIS — D869 Sarcoidosis, unspecified: Secondary | ICD-10-CM | POA: Diagnosis not present

## 2018-06-08 DIAGNOSIS — I2510 Atherosclerotic heart disease of native coronary artery without angina pectoris: Secondary | ICD-10-CM | POA: Insufficient documentation

## 2018-06-08 DIAGNOSIS — I509 Heart failure, unspecified: Secondary | ICD-10-CM | POA: Diagnosis present

## 2018-06-08 DIAGNOSIS — K219 Gastro-esophageal reflux disease without esophagitis: Secondary | ICD-10-CM | POA: Diagnosis not present

## 2018-06-08 DIAGNOSIS — Z96651 Presence of right artificial knee joint: Secondary | ICD-10-CM | POA: Insufficient documentation

## 2018-06-08 DIAGNOSIS — G629 Polyneuropathy, unspecified: Secondary | ICD-10-CM | POA: Diagnosis not present

## 2018-06-08 DIAGNOSIS — G4733 Obstructive sleep apnea (adult) (pediatric): Secondary | ICD-10-CM | POA: Insufficient documentation

## 2018-06-08 DIAGNOSIS — Z9861 Coronary angioplasty status: Secondary | ICD-10-CM | POA: Insufficient documentation

## 2018-06-08 DIAGNOSIS — Z7982 Long term (current) use of aspirin: Secondary | ICD-10-CM | POA: Diagnosis not present

## 2018-06-08 DIAGNOSIS — F419 Anxiety disorder, unspecified: Secondary | ICD-10-CM | POA: Insufficient documentation

## 2018-06-08 DIAGNOSIS — Z882 Allergy status to sulfonamides status: Secondary | ICD-10-CM | POA: Insufficient documentation

## 2018-06-08 NOTE — Progress Notes (Signed)
Patient ID: Sheila Long, female    DOB: Dec 25, 1924, 84 y.o.   MRN: [id number]  HPI  Ms Sharpnack is a 84 y/o female with a history of GERD, PVD, obstructive sleep apnea (CPAP), anxiety, HTN, thyroid disease, CAD and chronic heart failure.   Echo done 11/28/16 reviewed and showed an EF of 55-60% along with mild AR/TR and moderate MR. There was also mildly increased PA pressure of 38 mm Hg.  Was in the ED 05/13/18 due to peripheral edema where she was treated and released.   She presents today for a follow-up visit with a chief complaint of moderate fatigue upon minimal exertion. She describes this as chronic in nature having been present for several years with varying levels of severity. She has associated shortness of breath, edema, light- headedness, anxiety and gradual weight gain along with this. She denies any chest pain, palpitations, abdominal distention or difficulty sleeping.   Past Medical History:  Diagnosis Date  . Allergic rhinitis due to pollen   . Anxiety   . Aortic atherosclerosis (HCC)   . CHF (congestive heart failure) (HCC)   . Coronary artery disease    Stent in 2000's (DC)  . Dyspnea   . GERD (gastroesophageal reflux disease)   . Hard of hearing   . Hypertension   . Neuropathy   . Obstructive sleep apnea   . Osteoarthritis of both knees   . Peripheral vascular disease (HCC)   . Sarcoidosis   . Thyroid nodule    Past Surgical History:  Procedure Laterality Date  . ABDOMINAL HYSTERECTOMY    . BIOPSY THYROID  2014   Dr Andee Poles  . BREAST EXCISIONAL BIOPSY Left 1980s   surgical bx neg  . BREAST EXCISIONAL BIOPSY Left 1980s   benign  . CARDIOVASCULAR STRESS TEST  05/12/2012   with dobutamine---negative. Normal LV function  . CORONARY ANGIOPLASTY  2006  . JOINT REPLACEMENT Left 2000   hip  . JOINT REPLACEMENT Right 2007   hip  . KNEE ARTHROPLASTY Right 06/23/2017   Procedure: COMPUTER ASSISTED TOTAL KNEE ARTHROPLASTY;  Surgeon: Donato Heinz, MD;   Location: [facility] ORS;  Service: Orthopedics;  Laterality: Right;   No family history on file. Social History   Tobacco Use  . Smoking status: Never Smoker  . Smokeless tobacco: Never Used  Substance Use Topics  . Alcohol use: No   Allergies  Allergen Reactions  . Tramadol Other (See Comments)    CONFUSION  . Sulfa Antibiotics     Patient does not recall  . Zocor [Simvastatin] Other (See Comments)    INTOLERANCE-MYALGIAS    Prior to Admission medications   Medication Sig Start Date End Date Taking? Authorizing Provider  amLODipine (NORVASC) 5 MG tablet Take 5 mg by mouth daily. 01/20/18  Yes [provider]  aspirin EC 81 MG tablet Take 81 mg by mouth every other day.   Yes [provider]  furosemide (LASIX) 20 MG tablet Take 1 tablet (20 mg total) by mouth daily. 10/21/17 10/21/18 Yes Rifenbark, Lloyd Huger, MD  latanoprost (XALATAN) 0.005 % ophthalmic solution Place 1 drop into both eyes at bedtime. 05/30/17  Yes [provider]  metoprolol succinate (TOPROL-XL) 50 MG 24 hr tablet Take 3 tablets (150 mg total) by mouth daily. Take with or immediately following a meal. 10/30/17  Yes Karie Schwalbe, MD  ranitidine (ZANTAC) 150 MG tablet Take 150 mg by mouth daily.    Yes [provider]    Review of  Systems  Constitutional: Positive for fatigue. Negative for appetite change.  HENT: Negative for congestion, postnasal drip and sore throat.   Eyes: Negative.   Respiratory: Positive for chest tightness and shortness of breath.   Cardiovascular: Positive for leg swelling (left foot). Negative for chest pain and palpitations.  Gastrointestinal: Negative for abdominal distention and abdominal pain.  Endocrine: Negative.   Genitourinary: Negative.   Musculoskeletal: Positive for arthralgias (right knee pain). Negative for back pain.  Skin: Negative.   Allergic/Immunologic: Negative.   Neurological: Positive for light-headedness (first thing in the  morning). Negative for dizziness.  Hematological: Negative for adenopathy. Does not bruise/bleed easily.  Psychiatric/Behavioral: Negative for dysphoric mood and sleep disturbance. The patient is nervous/anxious.    Vitals:   06/08/18 1350  BP: 134/72  Pulse: 68  Resp: 18  SpO2: 99%  Weight: 217 lb 4 oz (98.5 kg)  Height: 5\' 4"  (1.626 m)   Wt Readings from Last 3 Encounters:  06/08/18 217 lb 4 oz (98.5 kg)  05/13/18 214 lb (97.1 kg)  04/16/18 213 lb (96.6 kg)    Lab Results  Component Value Date   CREATININE 0.89 05/13/2018   CREATININE 0.94 10/21/2017   CREATININE 0.84 06/25/2017    Physical Exam  Constitutional: She is oriented to person, place, and time. She appears well-developed and well-nourished.  HENT:  Head: Normocephalic and atraumatic.  Neck: Normal range of motion. Neck supple. No JVD present.  Cardiovascular: Normal rate and regular rhythm.  Pulmonary/Chest: Effort normal. She has no wheezes. She has no rales.  Abdominal: Soft. Bowel sounds are normal.  Musculoskeletal: She exhibits no edema or tenderness.  Neurological: She is alert and oriented to person, place, and time.  Skin: Skin is warm and dry.  Psychiatric: She has a normal mood and affect. Her behavior is normal. Thought content normal.  Nursing note and vitals reviewed.  Assessment & Plan:  1: Chronic heart failure with preserved ejection fraction- - NYHA class III - euvolemic at this time - weighing daily and says that her weight has been stable. Instructed to call for an overnight weight gain of >2 pounds or a weekly weight gain of >5 pounds - weight stable from last time she was here - not adding salt to her food and she does read food labels sometimes. Discussed the importance of closely following a 2000mg  sodium diet  - encouraged her to elevate her legs as much as possible when she's sitting for long periods of time - saw cardiologist (Paraschos) 05/21/18  2: HTN- - BP looks good  today - BMP from 05/13/18 reviewed and shows a sodium 138, potassium 3.9 and GFR >60 - saw PCP (Masoud) about one month ago. Switched PCP's not too long ago  Patient did not bring her medications nor a list. Each medication was verbally reviewed with the patient and she was encouraged to bring the bottles to every visit to confirm accuracy of list.  Patient opts to not make a return appointment at this time. She was instructed that she could call back at anytime to make another appointment.

## 2018-06-08 NOTE — Patient Instructions (Addendum)
Continue weighing daily and call for an overnight weight gain of > 2 pounds or a weekly weight gain of >5 pounds.  You can ask your insurance company about the following pulmonologists (lung doctors):  Freda MunroSaadat Khan, MD  Dr. Nicholos Johnsamachandran

## 2018-09-17 DIAGNOSIS — E8881 Metabolic syndrome: Secondary | ICD-10-CM | POA: Diagnosis not present

## 2018-09-17 DIAGNOSIS — M25461 Effusion, right knee: Secondary | ICD-10-CM | POA: Diagnosis not present

## 2018-09-17 DIAGNOSIS — N183 Chronic kidney disease, stage 3 (moderate): Secondary | ICD-10-CM | POA: Diagnosis not present

## 2018-09-17 DIAGNOSIS — E119 Type 2 diabetes mellitus without complications: Secondary | ICD-10-CM | POA: Diagnosis not present

## 2018-09-17 DIAGNOSIS — M130 Polyarthritis, unspecified: Secondary | ICD-10-CM | POA: Diagnosis not present

## 2018-09-21 DIAGNOSIS — I739 Peripheral vascular disease, unspecified: Secondary | ICD-10-CM | POA: Diagnosis not present

## 2018-09-21 DIAGNOSIS — D869 Sarcoidosis, unspecified: Secondary | ICD-10-CM | POA: Diagnosis not present

## 2018-09-21 DIAGNOSIS — I2729 Other secondary pulmonary hypertension: Secondary | ICD-10-CM | POA: Diagnosis not present

## 2018-09-21 DIAGNOSIS — R600 Localized edema: Secondary | ICD-10-CM | POA: Diagnosis not present

## 2018-09-21 DIAGNOSIS — I2510 Atherosclerotic heart disease of native coronary artery without angina pectoris: Secondary | ICD-10-CM | POA: Diagnosis not present

## 2018-09-21 DIAGNOSIS — R0609 Other forms of dyspnea: Secondary | ICD-10-CM | POA: Diagnosis not present

## 2018-09-21 DIAGNOSIS — I10 Essential (primary) hypertension: Secondary | ICD-10-CM | POA: Diagnosis not present

## 2018-09-21 DIAGNOSIS — I5032 Chronic diastolic (congestive) heart failure: Secondary | ICD-10-CM | POA: Diagnosis not present

## 2018-09-22 DIAGNOSIS — E7849 Other hyperlipidemia: Secondary | ICD-10-CM | POA: Diagnosis not present

## 2018-09-22 DIAGNOSIS — R5381 Other malaise: Secondary | ICD-10-CM | POA: Diagnosis not present

## 2018-09-22 DIAGNOSIS — I10 Essential (primary) hypertension: Secondary | ICD-10-CM | POA: Diagnosis not present

## 2018-09-22 DIAGNOSIS — E119 Type 2 diabetes mellitus without complications: Secondary | ICD-10-CM | POA: Diagnosis not present

## 2018-10-01 DIAGNOSIS — Z96651 Presence of right artificial knee joint: Secondary | ICD-10-CM | POA: Diagnosis not present

## 2018-10-01 DIAGNOSIS — M48062 Spinal stenosis, lumbar region with neurogenic claudication: Secondary | ICD-10-CM | POA: Diagnosis not present

## 2018-10-01 DIAGNOSIS — M7051 Other bursitis of knee, right knee: Secondary | ICD-10-CM | POA: Diagnosis not present

## 2018-10-01 DIAGNOSIS — M545 Low back pain: Secondary | ICD-10-CM | POA: Diagnosis not present

## 2018-10-01 DIAGNOSIS — Z96643 Presence of artificial hip joint, bilateral: Secondary | ICD-10-CM | POA: Diagnosis not present

## 2018-10-04 DIAGNOSIS — Z96643 Presence of artificial hip joint, bilateral: Secondary | ICD-10-CM | POA: Insufficient documentation

## 2018-10-05 DIAGNOSIS — I10 Essential (primary) hypertension: Secondary | ICD-10-CM | POA: Diagnosis not present

## 2018-10-05 DIAGNOSIS — D869 Sarcoidosis, unspecified: Secondary | ICD-10-CM | POA: Diagnosis not present

## 2018-10-05 DIAGNOSIS — I2510 Atherosclerotic heart disease of native coronary artery without angina pectoris: Secondary | ICD-10-CM | POA: Diagnosis not present

## 2018-10-05 DIAGNOSIS — I5032 Chronic diastolic (congestive) heart failure: Secondary | ICD-10-CM | POA: Diagnosis not present

## 2018-10-05 DIAGNOSIS — R600 Localized edema: Secondary | ICD-10-CM | POA: Diagnosis not present

## 2018-10-05 DIAGNOSIS — R0609 Other forms of dyspnea: Secondary | ICD-10-CM | POA: Diagnosis not present

## 2018-10-05 DIAGNOSIS — I2729 Other secondary pulmonary hypertension: Secondary | ICD-10-CM | POA: Diagnosis not present

## 2018-10-22 DIAGNOSIS — Z0000 Encounter for general adult medical examination without abnormal findings: Secondary | ICD-10-CM | POA: Diagnosis not present

## 2018-10-22 DIAGNOSIS — N183 Chronic kidney disease, stage 3 (moderate): Secondary | ICD-10-CM | POA: Diagnosis not present

## 2018-10-22 DIAGNOSIS — E119 Type 2 diabetes mellitus without complications: Secondary | ICD-10-CM | POA: Diagnosis not present

## 2018-10-22 DIAGNOSIS — M179 Osteoarthritis of knee, unspecified: Secondary | ICD-10-CM | POA: Diagnosis not present

## 2018-10-22 DIAGNOSIS — D696 Thrombocytopenia, unspecified: Secondary | ICD-10-CM | POA: Diagnosis not present

## 2018-11-09 ENCOUNTER — Telehealth: Payer: Self-pay | Admitting: General Surgery

## 2018-11-09 NOTE — Telephone Encounter (Signed)
Patient is calling said she was last saw in April of 2019 for an eval right under arm mass, patient said she was told to call our office if she began to have pain due to you would like to remove the cyst when the patient is having pain, patient said he pain level is at an eight. Would you like me to schedule an appointment for the patient, and if so what day would work for you. Please advise.

## 2018-11-09 NOTE — Telephone Encounter (Signed)
Tomorrow at 8 AM or Thursday at 11:45.

## 2018-11-12 ENCOUNTER — Other Ambulatory Visit: Payer: Self-pay

## 2018-11-12 ENCOUNTER — Ambulatory Visit (INDEPENDENT_AMBULATORY_CARE_PROVIDER_SITE_OTHER): Payer: PPO | Admitting: General Surgery

## 2018-11-12 ENCOUNTER — Encounter: Payer: Self-pay | Admitting: General Surgery

## 2018-11-12 VITALS — BP 174/91 | HR 82 | Temp 96.3°F | Resp 22 | Ht 64.0 in | Wt 220.4 lb

## 2018-11-12 DIAGNOSIS — L729 Follicular cyst of the skin and subcutaneous tissue, unspecified: Secondary | ICD-10-CM

## 2018-11-12 DIAGNOSIS — L02419 Cutaneous abscess of limb, unspecified: Secondary | ICD-10-CM | POA: Diagnosis not present

## 2018-11-12 NOTE — Progress Notes (Signed)
Patient ID: Sheila Long, female   DOB: 22-Apr-1924, 84 y.o.   MRN: [id number]  Chief Complaint  Patient presents with  . Follow-up    eval right under arm mass per Dr. Lemar Livings    HPI Sheila Long is a 84 y.o. female here today for evaluation of a known right axilla skin cyst. She states it starting hurting and swelling last week, no drainage. She denies fever or chills. She had an echo 3 weeks ago to evaluate her shortness of breath that turned out "ok".   HPI  Past Medical History:  Diagnosis Date  . Allergic rhinitis due to pollen   . Anxiety   . Aortic atherosclerosis (HCC)   . CHF (congestive heart failure) (HCC)   . Coronary artery disease    Stent in 2000's (DC)  . Dyspnea   . GERD (gastroesophageal reflux disease)   . Hard of hearing   . Hypertension   . Neuropathy   . Obstructive sleep apnea   . Osteoarthritis of both knees   . Peripheral vascular disease (HCC)   . Sarcoidosis   . Thyroid nodule     Past Surgical History:  Procedure Laterality Date  . ABDOMINAL HYSTERECTOMY    . BIOPSY THYROID  2014   Dr Andee Poles  . BREAST EXCISIONAL BIOPSY Left 1980s   surgical bx neg  . BREAST EXCISIONAL BIOPSY Left 1980s   benign  . CARDIOVASCULAR STRESS TEST  05/12/2012   with dobutamine---negative. Normal LV function  . CORONARY ANGIOPLASTY  2006  . JOINT REPLACEMENT Left 2000   hip  . JOINT REPLACEMENT Right 2007   hip  . KNEE ARTHROPLASTY Right 06/23/2017   Procedure: COMPUTER ASSISTED TOTAL KNEE ARTHROPLASTY;  Surgeon: Donato Heinz, MD;  Location: [facility] ORS;  Service: Orthopedics;  Laterality: Right;    No family history on file.  Social History Social History   Tobacco Use  . Smoking status: Never Smoker  . Smokeless tobacco: Never Used  Substance Use Topics  . Alcohol use: No  . Drug use: No    Allergies  Allergen Reactions  . Tramadol Other (See Comments)    CONFUSION  . Sulfa Antibiotics     Patient does not recall  . Zocor  [Simvastatin] Other (See Comments)    INTOLERANCE-MYALGIAS     Current Outpatient Medications  Medication Sig Dispense Refill  . amLODipine (NORVASC) 5 MG tablet Take 5 mg by mouth daily.  11  . aspirin EC 81 MG tablet Take 81 mg by mouth every other day.    . latanoprost (XALATAN) 0.005 % ophthalmic solution Place 1 drop into both eyes at bedtime.  5  . metoprolol succinate (TOPROL-XL) 50 MG 24 hr tablet Take 3 tablets (150 mg total) by mouth daily. Take with or immediately following a meal. 270 tablet 3  . ranitidine (ZANTAC) 150 MG tablet Take 150 mg by mouth daily.     . furosemide (LASIX) 20 MG tablet Take 1 tablet (20 mg total) by mouth daily. 30 tablet 0   No current facility-administered medications for this visit.     Review of Systems Review of Systems  Constitutional: Negative.   Respiratory: Positive for shortness of breath.   Cardiovascular: Negative.     Blood pressure (!) 174/91, pulse 82, temperature (!) 96.3 F (35.7 C), temperature source Skin, resp. rate (!) 22, height 5\' 4"  (1.626 m), weight 220 lb 6.4 oz (100 kg), SpO2 98 %.  Physical Exam Physical Exam  Constitutional: She is  oriented to person, place, and time. She appears well-developed and well-nourished.  Pulmonary/Chest:    Neurological: She is alert and oriented to person, place, and time.  Skin: Skin is warm and dry.  Psychiatric: Her behavior is normal.    Data Reviewed Incision and drainage was recommended and accepted.  ChloraPrep followed by 10 cc of 0.5% Xylocaine with 0.25% Marcaine with 1 to 200,000 units epinephrine.  ChloraPrep applied a second time.  2 cm transverse incision made with release of about 3 cc of purulent fluid.  Majority of the cyst wall extracted.  No bleeding.  Dry dressing applied.  Procedure well-tolerated.  Assessment    Infection of long-standing sebaceous cyst of the right axilla.    Plan  Wound care was reviewed.  Area will be kept covered until healing is  complete.    Unless she develops recurrent discomfort or swelling follow-up will be on an as-needed basis.      HPI, Physical Exam, Assessment and Plan have been scribed under the direction and in the presence of Earline MayotteJeffrey W. Micha Dosanjh, MD. Dorathy DaftMarsha [name], RN  HPI, Physical Exam, Assessment and Plan have been scribed under the direction and in the presence of Sheila CurryJeffrey Leviticus Harton, Md.  Sheila DutyRanda R. Rubye Long, CMA  I have completed the exam and reviewed the above documentation for accuracy and completeness.  I agree with the above.  [employer] has been used and any errors in dictation or transcription are unintentional.  Sheila CurryJeffrey Prithvi Kooi, M.D., F.A.C.S.  Sheila PewJeffrey W Erricka Long 11/12/2018, 2:01 PM

## 2018-11-12 NOTE — Progress Notes (Deleted)
Patient ID: Sheila Long, female   DOB: [birth year], 84 y.o.   MRN: [id number]  Chief Complaint  Patient presents with  . Follow-up    eval right under arm mass per Dr. Lemar Livings    HPI Sheila Long is a 84 y.o. female. Here for evaluation of a known right axilla skin cyst. She states it starting hurting and swelling last week. No fever or chills.  She had echo about 3 weeks ago.   HPI  Past Medical History:  Diagnosis Date  . Allergic rhinitis due to pollen   . Anxiety   . Aortic atherosclerosis (HCC)   . CHF (congestive heart failure) (HCC)   . Coronary artery disease    Stent in 2000's (DC)  . Dyspnea   . GERD (gastroesophageal reflux disease)   . Hard of hearing   . Hypertension   . Neuropathy   . Obstructive sleep apnea   . Osteoarthritis of both knees   . Peripheral vascular disease (HCC)   . Sarcoidosis   . Thyroid nodule     Past Surgical History:  Procedure Laterality Date  . ABDOMINAL HYSTERECTOMY    . BIOPSY THYROID  2014   Dr Andee Poles  . BREAST EXCISIONAL BIOPSY Left 1980s   surgical bx neg  . BREAST EXCISIONAL BIOPSY Left 1980s   benign  . CARDIOVASCULAR STRESS TEST  05/12/2012   with dobutamine---negative. Normal LV function  . CORONARY ANGIOPLASTY  2006  . JOINT REPLACEMENT Left 2000   hip  . JOINT REPLACEMENT Right 2007   hip  . KNEE ARTHROPLASTY Right 06/23/2017   Procedure: COMPUTER ASSISTED TOTAL KNEE ARTHROPLASTY;  Surgeon: Donato Heinz, MD;  Location: [facility] ORS;  Service: Orthopedics;  Laterality: Right;    No family history on file.  Social History Social History   Tobacco Use  . Smoking status: Never Smoker  . Smokeless tobacco: Never Used  Substance Use Topics  . Alcohol use: No  . Drug use: No    Allergies  Allergen Reactions  . Tramadol Other (See Comments)    CONFUSION  . Sulfa Antibiotics     Patient does not recall  . Zocor [Simvastatin] Other (See Comments)    INTOLERANCE-MYALGIAS     Current  Outpatient Medications  Medication Sig Dispense Refill  . amLODipine (NORVASC) 5 MG tablet Take 5 mg by mouth daily.  11  . aspirin EC 81 MG tablet Take 81 mg by mouth every other day.    . furosemide (LASIX) 20 MG tablet Take 1 tablet (20 mg total) by mouth daily. 30 tablet 0  . latanoprost (XALATAN) 0.005 % ophthalmic solution Place 1 drop into both eyes at bedtime.  5  . metoprolol succinate (TOPROL-XL) 50 MG 24 hr tablet Take 3 tablets (150 mg total) by mouth daily. Take with or immediately following a meal. 270 tablet 3  . ranitidine (ZANTAC) 150 MG tablet Take 150 mg by mouth daily.      No current facility-administered medications for this visit.     Review of Systems Review of Systems  Constitutional: Negative.   Respiratory: Positive for shortness of breath.   Cardiovascular: Negative.     Blood pressure (!) 174/91, pulse 82, temperature (!) 96.3 F (35.7 C), temperature source Skin, resp. rate (!) 22, height 5\' 4"  (1.626 m), weight 220 lb 6.4 oz (100 kg), SpO2 98 %.  Physical Exam Physical Exam  Data Reviewed ***  Assessment    ***    Plan    ***  Dorathy DaftHatch, [name] 11/12/2018, 11:38 AM

## 2018-11-12 NOTE — Patient Instructions (Addendum)
The patient is aware to call back for any questions or concerns. Keep area clean Change dressing as needed

## 2018-11-23 DIAGNOSIS — H401111 Primary open-angle glaucoma, right eye, mild stage: Secondary | ICD-10-CM | POA: Diagnosis not present

## 2018-12-04 DIAGNOSIS — M25461 Effusion, right knee: Secondary | ICD-10-CM | POA: Diagnosis not present

## 2018-12-04 DIAGNOSIS — E8881 Metabolic syndrome: Secondary | ICD-10-CM | POA: Diagnosis not present

## 2018-12-04 DIAGNOSIS — R51 Headache: Secondary | ICD-10-CM | POA: Diagnosis not present

## 2018-12-04 DIAGNOSIS — N183 Chronic kidney disease, stage 3 (moderate): Secondary | ICD-10-CM | POA: Diagnosis not present

## 2018-12-13 ENCOUNTER — Emergency Department
Admission: EM | Admit: 2018-12-13 | Discharge: 2018-12-13 | Disposition: A | Discharge status: Home / Self Care | Payer: PPO | Attending: Emergency Medicine | Admitting: Emergency Medicine

## 2018-12-13 ENCOUNTER — Other Ambulatory Visit: Payer: Self-pay

## 2018-12-13 ENCOUNTER — Encounter: Payer: Self-pay | Admitting: Emergency Medicine

## 2018-12-13 ENCOUNTER — Emergency Department: Payer: PPO

## 2018-12-13 DIAGNOSIS — I2510 Atherosclerotic heart disease of native coronary artery without angina pectoris: Secondary | ICD-10-CM | POA: Insufficient documentation

## 2018-12-13 DIAGNOSIS — I110 Hypertensive heart disease with heart failure: Secondary | ICD-10-CM | POA: Insufficient documentation

## 2018-12-13 DIAGNOSIS — Z7982 Long term (current) use of aspirin: Secondary | ICD-10-CM | POA: Diagnosis not present

## 2018-12-13 DIAGNOSIS — R202 Paresthesia of skin: Secondary | ICD-10-CM | POA: Diagnosis not present

## 2018-12-13 DIAGNOSIS — I10 Essential (primary) hypertension: Secondary | ICD-10-CM

## 2018-12-13 DIAGNOSIS — I5032 Chronic diastolic (congestive) heart failure: Secondary | ICD-10-CM | POA: Insufficient documentation

## 2018-12-13 DIAGNOSIS — Z79899 Other long term (current) drug therapy: Secondary | ICD-10-CM | POA: Diagnosis not present

## 2018-12-13 DIAGNOSIS — R200 Anesthesia of skin: Secondary | ICD-10-CM | POA: Diagnosis not present

## 2018-12-13 LAB — COMPREHENSIVE METABOLIC PANEL
ALT: 7 U/L (ref 0–44)
AST: 16 U/L (ref 15–41)
Albumin: 4.1 g/dL (ref 3.5–5.0)
Alkaline Phosphatase: 74 U/L (ref 38–126)
Anion gap: 6 (ref 5–15)
BUN: 18 mg/dL (ref 8–23)
CO2: 25 mmol/L (ref 22–32)
Calcium: 8.8 mg/dL — ABNORMAL LOW (ref 8.9–10.3)
Chloride: 109 mmol/L (ref 98–111)
Creatinine, Ser: 0.99 mg/dL (ref 0.44–1.00)
GFR calc Af Amer: 57 mL/min — ABNORMAL LOW (ref >60)
GFR calc non Af Amer: 49 mL/min — ABNORMAL LOW (ref >60)
Glucose, Bld: 126 mg/dL — ABNORMAL HIGH (ref 70–99)
Potassium: 4 mmol/L (ref 3.5–5.1)
Sodium: 140 mmol/L (ref 135–145)
Total Bilirubin: 1.1 mg/dL (ref 0.3–1.2)
Total Protein: 7 g/dL (ref 6.5–8.1)

## 2018-12-13 LAB — DIFFERENTIAL
Abs Immature Granulocytes: 0.02 10*3/uL (ref 0.00–0.07)
Basophils Absolute: 0 10*3/uL (ref 0.0–0.1)
Basophils Relative: 1 %
Eosinophils Absolute: 0.1 10*3/uL (ref 0.0–0.5)
Eosinophils Relative: 2 %
Immature Granulocytes: 0 %
Lymphocytes Relative: 23 %
Lymphs Abs: 1.3 10*3/uL (ref 0.7–4.0)
Monocytes Absolute: 0.5 10*3/uL (ref 0.1–1.0)
Monocytes Relative: 9 %
Neutro Abs: 3.7 10*3/uL (ref 1.7–7.7)
Neutrophils Relative %: 65 %

## 2018-12-13 LAB — CBC
HCT: 39.3 % (ref 36.0–46.0)
Hemoglobin: 13 g/dL (ref 12.0–15.0)
MCH: 33.3 pg (ref 26.0–34.0)
MCHC: 33.1 g/dL (ref 30.0–36.0)
MCV: 100.8 fL — ABNORMAL HIGH (ref 80.0–100.0)
Platelets: 205 10*3/uL (ref 150–400)
RBC: 3.9 MIL/uL (ref 3.87–5.11)
RDW: 13.8 % (ref 11.5–15.5)
WBC: 5.7 10*3/uL (ref 4.0–10.5)
nRBC: 0 % (ref 0.0–0.2)

## 2018-12-13 LAB — PROTIME-INR
INR: 0.96
Prothrombin Time: 12.7 seconds (ref 11.4–15.2)

## 2018-12-13 LAB — TROPONIN I: Troponin I: <0.03 ng/mL (ref <0.03)

## 2018-12-13 LAB — APTT: aPTT: 27 seconds (ref 24–36)

## 2018-12-13 NOTE — ED Triage Notes (Signed)
Pt to ED via POV c/o high blood pressure. Pt states that for the past few days her blood pressure has been high. Pt states that her doctor changed her blood pressure medication last week. Pt states that on Friday she noticed some numbness in her right hand, this has since resolved. Pt states that she has been having numbness in her left hand on and off for about a month. Pt states that she has also been drooling for about 1 month but this was worse today. Pt is in NAD at this time.

## 2018-12-13 NOTE — ED Triage Notes (Signed)
First Nurse Note:  ARrives from [facility] for ED evaluation of HTN.  Patient is AAOx3.  Skin warm and dry. MAE equally and strong.  NAD

## 2018-12-13 NOTE — ED Notes (Addendum)
Intermittent numbness to left hand over last month when sleeping.  Has had intermittent numbness right hand numbness since Friday but goes away when she rubs it. Pt most concerned because doctor "switched my medicine from 150 mg to 10 mg".  Dr. Cyril LoosenKinner attempting to explain that they are different medications and that is why dose different. No other neuro symptoms. Will wake up drooling per pt for last month.  No drooling now or when speaking. Speech clear.

## 2018-12-13 NOTE — ED Notes (Signed)
Pt informed this RN during triage that she has also been having some dizziness and blurred vision since Friday. Given pts symptoms of Rt sided numbness, dizziness, and blurred vision, orders for stroke protocol placed, code stroke not called due to pt being out of time frame.

## 2018-12-13 NOTE — ED Provider Notes (Signed)
[facility] Emergency Department Provider Note   ____________________________________________    I have reviewed the triage vital signs and the nursing notes.   HISTORY  Chief Complaint Hypertension     HPI Sheila Long is a 84 y.o. female who presents with several complaints, primarily she is concerned about her blood pressure.  She reports that her PCP has been adjusting her blood pressure medication and she feels that it is not as well-controlled as it should be.  Apparently she was recently started on lisinopril and her metoprolol was stopped.  She is concerned because lisinopril dosage seems much smaller than the metoprolol dosage and today she noted that her blood pressure was high.  She reports over the last month she has been having intermittent tingling in her left hand, this apparently happened in her right hand 2 days ago.  She denies weakness, she reports it felt stiff.  No headaches.  She reports at night she is waking up and has more drool than she is used to over the last 2 weeks.  Denies difficulty drinking or swallowing or eating   Past Medical History:  Diagnosis Date  . Allergic rhinitis due to pollen   . Anxiety   . Aortic atherosclerosis (HCC)   . CHF (congestive heart failure) (HCC)   . Coronary artery disease    Stent in 2000's (DC)  . Dyspnea   . GERD (gastroesophageal reflux disease)   . Hard of hearing   . Hypertension   . Neuropathy   . Obstructive sleep apnea   . Osteoarthritis of both knees   . Peripheral vascular disease (HCC)   . Sarcoidosis   . Thyroid nodule     Patient Active Problem List   Diagnosis Date Noted  . Axillary abscess 11/12/2018  . Prediabetes 10/30/2017  . Chronic diastolic heart failure (HCC) 10/29/2017  . Anxiety 10/29/2017  . Fatigue 07/29/2017  . Mood disorder (HCC) 07/29/2017  . S/P total knee arthroplasty 06/23/2017  . Pulmonary hypertension associated with sarcoidosis (HCC)  02/03/2017  . Lower extremity edema 11/28/2016  . Familial tremor 09/24/2016  . Sarcoidosis   . Hypertension   . Allergic rhinitis due to pollen   . Coronary artery disease   . Aortic atherosclerosis (HCC)   . GERD (gastroesophageal reflux disease)   . Peripheral vascular disease (HCC)   . Osteoarthritis of both knees   . Obstructive sleep apnea   . Neuropathy     Past Surgical History:  Procedure Laterality Date  . ABDOMINAL HYSTERECTOMY    . BIOPSY THYROID  2014   Dr Andee Poles  . BREAST EXCISIONAL BIOPSY Left 1980s   surgical bx neg  . BREAST EXCISIONAL BIOPSY Left 1980s   benign  . CARDIOVASCULAR STRESS TEST  05/12/2012   with dobutamine---negative. Normal LV function  . CORONARY ANGIOPLASTY  2006  . JOINT REPLACEMENT Left 2000   hip  . JOINT REPLACEMENT Right 2007   hip  . KNEE ARTHROPLASTY Right 06/23/2017   Procedure: COMPUTER ASSISTED TOTAL KNEE ARTHROPLASTY;  Surgeon: Donato Heinz, MD;  Location: [facility] ORS;  Service: Orthopedics;  Laterality: Right;    Prior to Admission medications   Medication Sig Start Date End Date Taking? Authorizing Provider  amLODipine (NORVASC) 5 MG tablet Take 5 mg by mouth daily. 01/20/18   [provider]  aspirin EC 81 MG tablet Take 81 mg by mouth every other day.    [provider]  furosemide (LASIX) 20 MG tablet  Take 1 tablet (20 mg total) by mouth daily. 10/21/17 10/21/18  Merrily Brittle, MD  latanoprost (XALATAN) 0.005 % ophthalmic solution Place 1 drop into both eyes at bedtime. 05/30/17   [provider]  metoprolol succinate (TOPROL-XL) 50 MG 24 hr tablet Take 3 tablets (150 mg total) by mouth daily. Take with or immediately following a meal. 10/30/17   Karie Schwalbe, MD  ranitidine (ZANTAC) 150 MG tablet Take 150 mg by mouth daily.     [provider]     Allergies Tramadol; Sulfa antibiotics; and Zocor [simvastatin]  No family history on file.  Social History Social History    Tobacco Use  . Smoking status: Never Smoker  . Smokeless tobacco: Never Used  Substance Use Topics  . Alcohol use: No  . Drug use: No    Review of Systems  Constitutional: No fever/chills Eyes: No visual changes.  ENT: As above Cardiovascular: Denies chest pain. Respiratory: Denies shortness of breath. Gastrointestinal: No abdominal pain.   Genitourinary: Negative for dysuria. Musculoskeletal: Negative for back pain. Skin: Negative for rash. Neurological: Negative for headaches    ____________________________________________   PHYSICAL EXAM:  VITAL SIGNS: ED Triage Vitals [12/13/18 1350]  Enc Vitals Group     BP (!) 159/78     Pulse Rate 83     Resp 16     Temp 98.5 F (36.9 C)     Temp Source Oral     SpO2 98 %     Weight 99.8 kg (220 lb)     Height 1.626 m (5\' 4" )     Head Circumference      Peak Flow      Pain Score 0     Pain Loc      Pain Edu?      Excl. in GC?     Constitutional: Alert and oriented. No acute distress. Pleasant and interactive Eyes: Conjunctivae are normal.  PERRLA, EOMI  Nose: No congestion/rhinnorhea. Mouth/Throat: Mucous membranes are moist.   Neck:  Painless ROM Cardiovascular: Normal rate, regular rhythm. Grossly normal heart sounds.  Good peripheral circulation. Respiratory: Normal respiratory effort.  No retractions. Lungs CTAB. Gastrointestinal: Soft and nontender. No distention.  No CVA tenderness. Genitourinary: deferred Musculoskeletal:  Warm and well perfused, normal strength in all extremities Neurologic:  Normal speech and language. No gross focal neurologic deficits are appreciated.  Cranial nerves II through XII are normal, no facial droop Skin:  Skin is warm, dry and intact. No rash noted. Psychiatric: Mood and affect are normal. Speech and behavior are normal.  ____________________________________________   LABS (all labs ordered are listed, but only abnormal results are displayed)  Labs Reviewed  CBC -  Abnormal; Notable for the following components:      Result Value   MCV 100.8 (*)    All other components within normal limits  COMPREHENSIVE METABOLIC PANEL - Abnormal; Notable for the following components:   Glucose, Bld 126 (*)    Calcium 8.8 (*)    GFR calc non Af Amer 49 (*)    GFR calc Af Amer 57 (*)    All other components within normal limits  PROTIME-INR  APTT  DIFFERENTIAL  TROPONIN I   ____________________________________________  EKG  ED ECG REPORT I, Jene Every, the attending physician, personally viewed and interpreted this ECG.  Date: 12/13/2018  Rhythm: normal sinus rhythm QRS Axis: normal Intervals: normal ST/T Wave abnormalities: normal Narrative Interpretation: no evidence of acute ischemia  ____________________________________________  RADIOLOGY  CT head unremarkable ____________________________________________   PROCEDURES  Procedure(s) performed: No  Procedures   Critical Care performed: No ____________________________________________   INITIAL IMPRESSION / ASSESSMENT AND PLAN / ED COURSE  Pertinent labs & imaging results that were available during my care of the patient were reviewed by me and considered in my medical decision making (see chart for details).  Patient presents with multiple complaints as above, she is primarily concerned about her blood pressure.  Without intervention her blood pressure trended down in the emergency department.  I recommended the patient take her medications as prescribed by her PCP and follow-up with her PCP.  Right hand symptoms were discussed at length, she is insistent that her hand was stiff and not weak.  She reports she massaged it and it felt better.  This does not sound consistent with TIA or CVA.  Patient has an equal smile, no facial droop unclear why she is waking up with more drool than she is used to.  Given patient's reassuring exam improving blood pressure feel inpatient work-up not  necessary, recommend she follow-up closely with her PCP.  Return precautions discussed   ____________________________________________   FINAL CLINICAL IMPRESSION(S) / ED DIAGNOSES  Final diagnoses:  Essential hypertension        Note:  This document was prepared using Dragon voice recognition software and may include unintentional dictation errors.    Jene EveryKinner, Mayeli Bornhorst, MD 12/13/18 [phone]

## 2018-12-15 DIAGNOSIS — I2729 Other secondary pulmonary hypertension: Secondary | ICD-10-CM | POA: Diagnosis not present

## 2018-12-15 DIAGNOSIS — D869 Sarcoidosis, unspecified: Secondary | ICD-10-CM | POA: Diagnosis not present

## 2018-12-15 DIAGNOSIS — I2510 Atherosclerotic heart disease of native coronary artery without angina pectoris: Secondary | ICD-10-CM | POA: Diagnosis not present

## 2018-12-15 DIAGNOSIS — R0609 Other forms of dyspnea: Secondary | ICD-10-CM | POA: Diagnosis not present

## 2018-12-15 DIAGNOSIS — R600 Localized edema: Secondary | ICD-10-CM | POA: Diagnosis not present

## 2018-12-15 DIAGNOSIS — R42 Dizziness and giddiness: Secondary | ICD-10-CM | POA: Diagnosis not present

## 2018-12-15 DIAGNOSIS — I10 Essential (primary) hypertension: Secondary | ICD-10-CM | POA: Diagnosis not present

## 2019-01-08 IMAGING — US US EXTREM LOW VENOUS*R*
1 series · 13 of 24 positions shown · non-contrast
Comparison: 07/21/2012.

CLINICAL DATA: Right leg pain since last evening. Right knee
surgery 06/23/2017.



[Series 1: us extrem low venous*right* · 0.08mm/px · 13 of 35 slices shown]
[im 1/35]
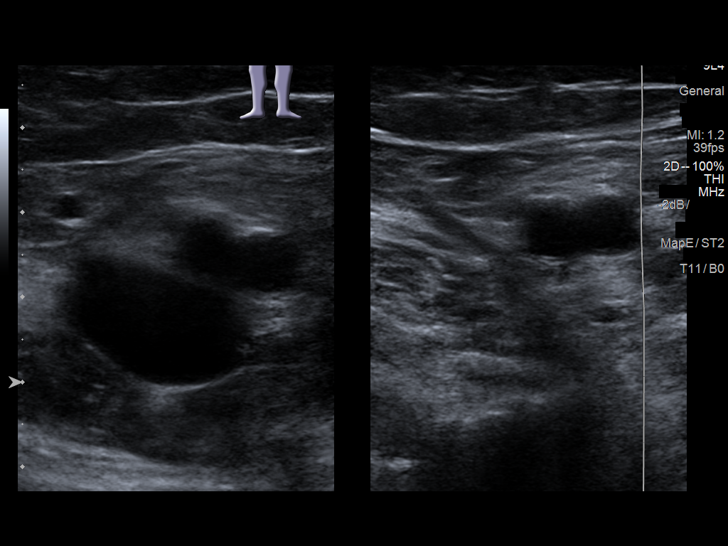
[im 3/35]
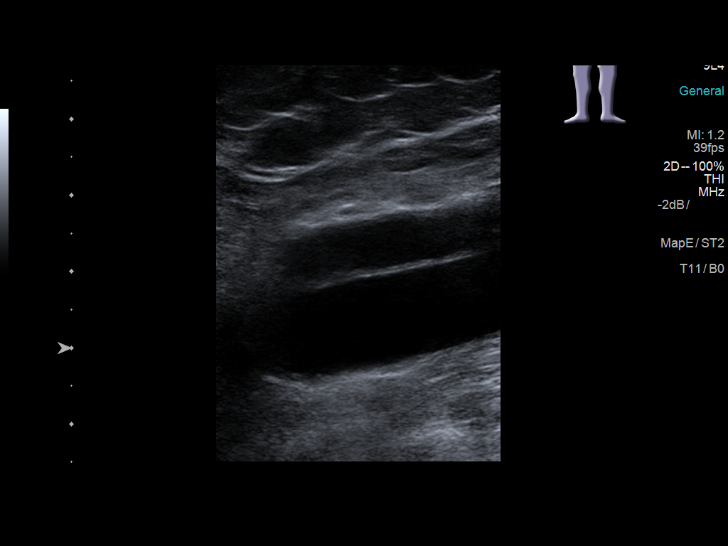
[im 6/35]
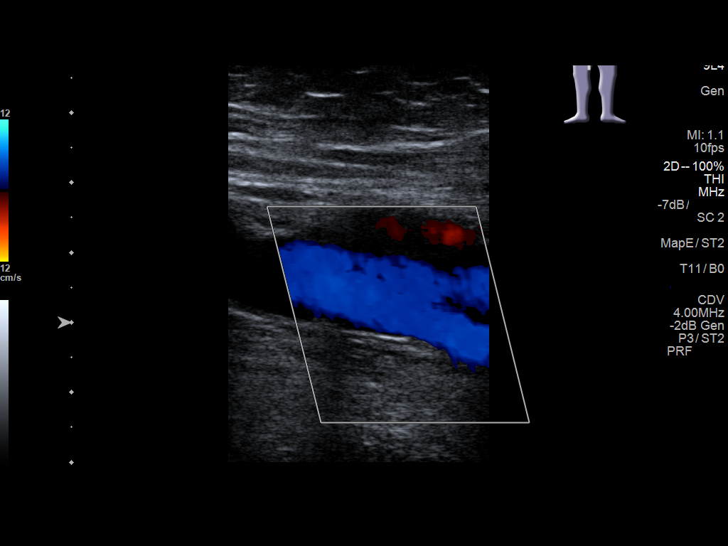
[im 9/35]
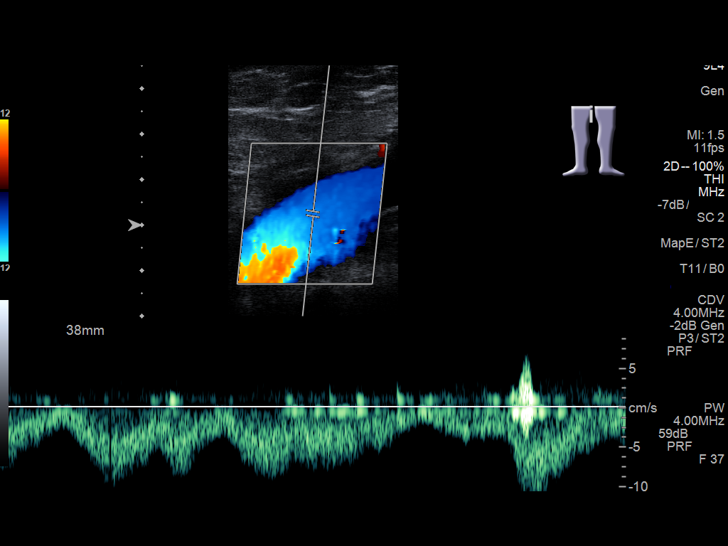
[im 12/35]
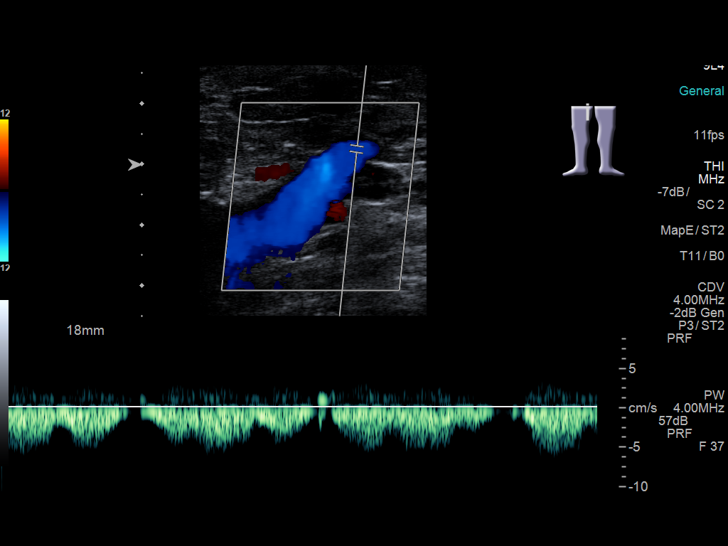
[im 15/35]
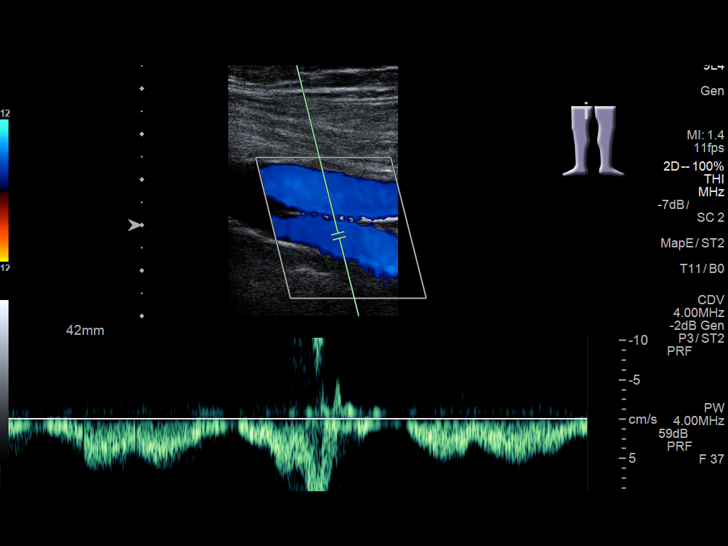
[im 18/35]
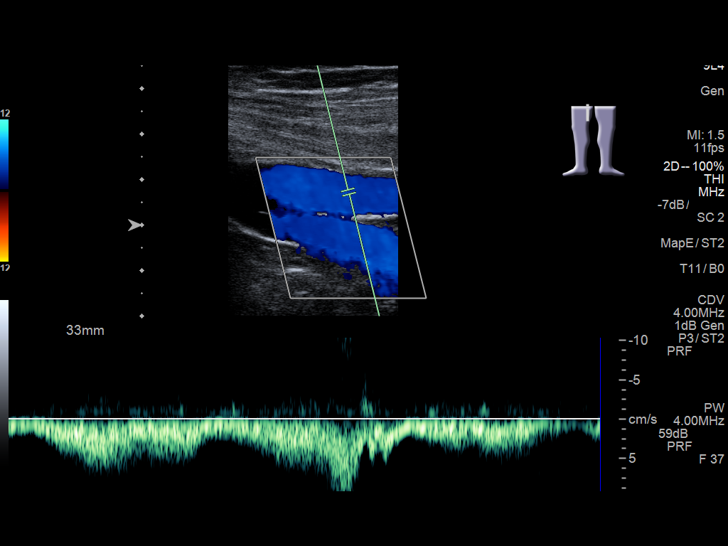
[im 20/35]
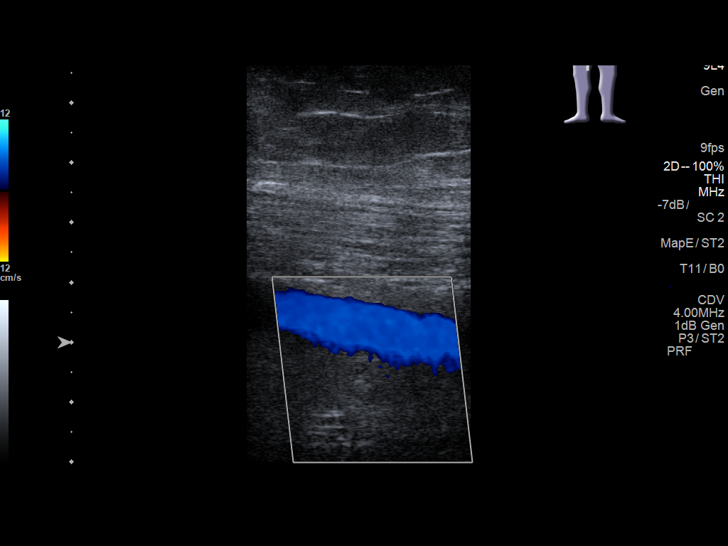
[im 23/35]
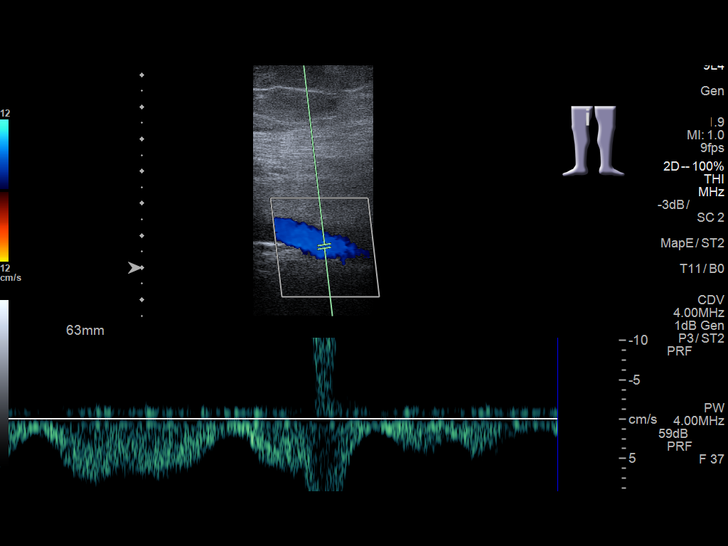
[im 26/35]
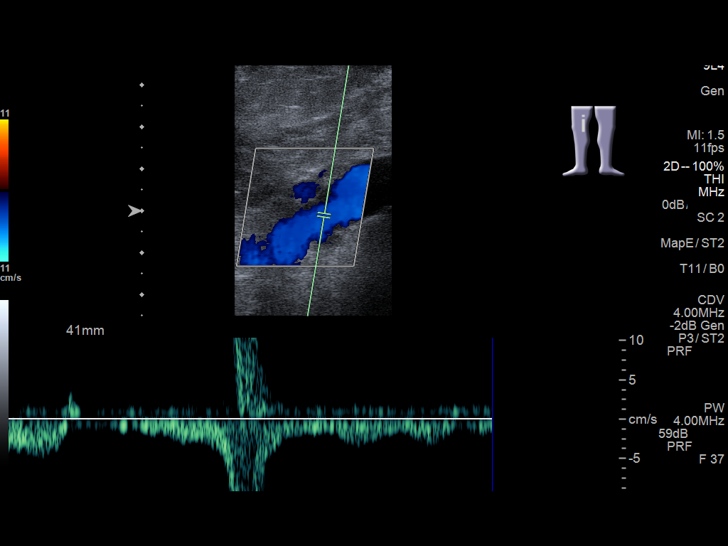
[im 29/35]
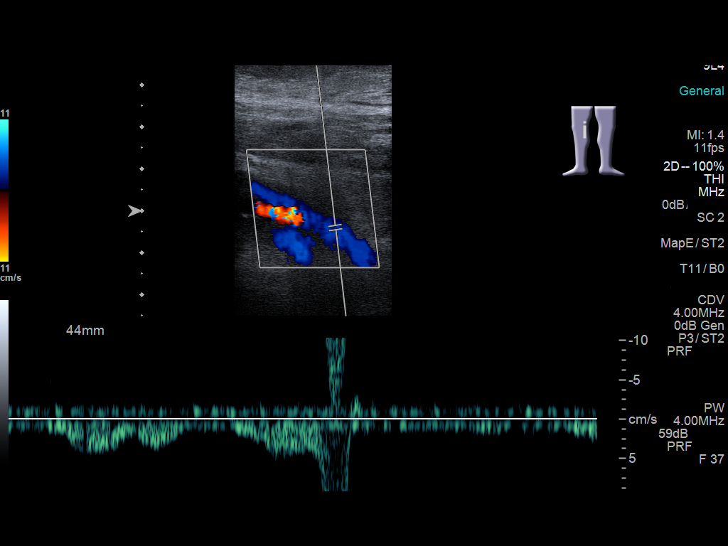
[im 32/35]
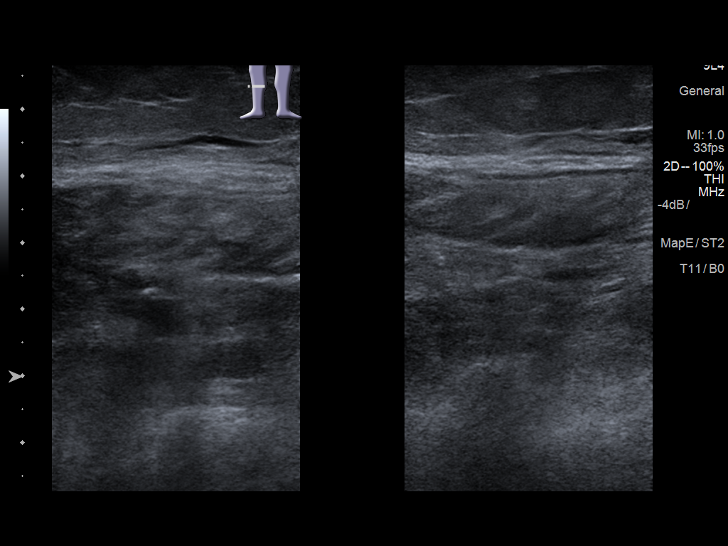
[im 35/35]
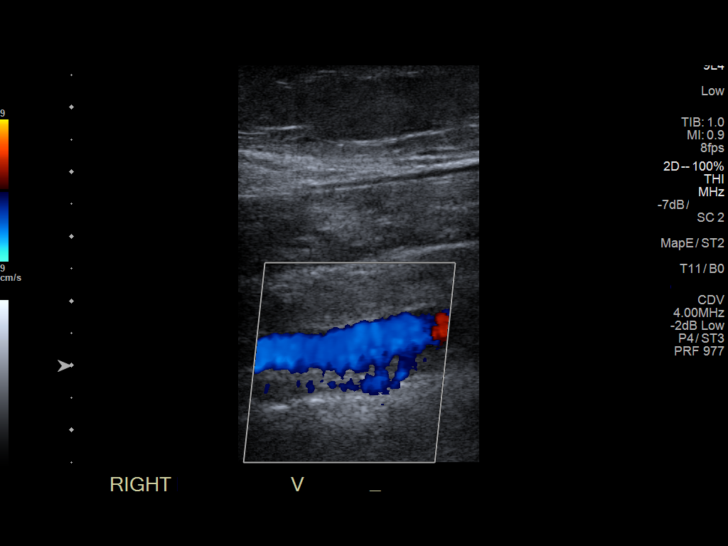

[13 of 24 positions shown; findings below may reference images not displayed]

FINDINGS: Contralateral Common Femoral Vein: Respiratory phasicity is normal
and symmetric with the symptomatic side. No evidence of thrombus.
Normal compressibility.

Common Femoral Vein: No evidence of thrombus. Normal
compressibility, respiratory phasicity and response to augmentation.

Saphenofemoral Junction: No evidence of thrombus. Normal
compressibility and flow on color Doppler imaging.

Profunda Femoral Vein: No evidence of thrombus. Normal
compressibility and flow on color Doppler imaging.

Femoral Vein: No evidence of thrombus. Normal compressibility,
respiratory phasicity and response to augmentation.

Popliteal Vein: No evidence of thrombus. Normal compressibility,
respiratory phasicity and response to augmentation.

Calf Veins: No evidence of thrombus. Normal compressibility and flow
on color Doppler imaging.

Superficial Great Saphenous Vein: No evidence of thrombus. Normal
compressibility and flow on color Doppler imaging.

Venous Reflux:  None.

Other Findings:  None.
IMPRESSION: No evidence of DVT within the right lower extremity.

## 2019-01-28 DIAGNOSIS — K219 Gastro-esophageal reflux disease without esophagitis: Secondary | ICD-10-CM | POA: Diagnosis not present

## 2019-01-28 DIAGNOSIS — H6123 Impacted cerumen, bilateral: Secondary | ICD-10-CM | POA: Diagnosis not present

## 2019-01-28 DIAGNOSIS — H903 Sensorineural hearing loss, bilateral: Secondary | ICD-10-CM | POA: Diagnosis not present

## 2019-02-01 DIAGNOSIS — D869 Sarcoidosis, unspecified: Secondary | ICD-10-CM | POA: Diagnosis not present

## 2019-02-01 DIAGNOSIS — R259 Unspecified abnormal involuntary movements: Secondary | ICD-10-CM | POA: Diagnosis not present

## 2019-02-01 DIAGNOSIS — J218 Acute bronchiolitis due to other specified organisms: Secondary | ICD-10-CM | POA: Diagnosis not present

## 2019-02-01 DIAGNOSIS — E8881 Metabolic syndrome: Secondary | ICD-10-CM | POA: Diagnosis not present

## 2019-02-12 ENCOUNTER — Ambulatory Visit
Admission: RE | Admit: 2019-02-12 | Discharge: 2019-02-12 | Disposition: A | Discharge status: Home / Self Care | Payer: PPO | Attending: Internal Medicine | Admitting: Internal Medicine

## 2019-02-18 ENCOUNTER — Ambulatory Visit
Admission: RE | Admit: 2019-02-18 | Discharge: 2019-02-18 | Disposition: A | Discharge status: Home / Self Care | Payer: PPO | Source: Ambulatory Visit | Attending: Internal Medicine | Admitting: Internal Medicine

## 2019-02-18 ENCOUNTER — Ambulatory Visit
Admission: RE | Admit: 2019-02-18 | Discharge: 2019-02-18 | Disposition: A | Discharge status: Home / Self Care | Payer: PPO | Attending: Internal Medicine | Admitting: Internal Medicine

## 2019-02-18 ENCOUNTER — Other Ambulatory Visit: Payer: Self-pay | Admitting: Internal Medicine

## 2019-02-18 DIAGNOSIS — R062 Wheezing: Secondary | ICD-10-CM | POA: Insufficient documentation

## 2019-02-18 DIAGNOSIS — R05 Cough: Secondary | ICD-10-CM | POA: Diagnosis not present

## 2019-02-18 DIAGNOSIS — R0602 Shortness of breath: Secondary | ICD-10-CM | POA: Insufficient documentation

## 2019-02-23 DIAGNOSIS — H401130 Primary open-angle glaucoma, bilateral, stage unspecified: Secondary | ICD-10-CM | POA: Diagnosis not present

## 2019-02-26 DIAGNOSIS — R51 Headache: Secondary | ICD-10-CM | POA: Diagnosis not present

## 2019-02-26 DIAGNOSIS — D869 Sarcoidosis, unspecified: Secondary | ICD-10-CM | POA: Diagnosis not present

## 2019-02-26 DIAGNOSIS — N183 Chronic kidney disease, stage 3 (moderate): Secondary | ICD-10-CM | POA: Diagnosis not present

## 2019-02-26 DIAGNOSIS — M130 Polyarthritis, unspecified: Secondary | ICD-10-CM | POA: Diagnosis not present

## 2019-03-04 DIAGNOSIS — Z96649 Presence of unspecified artificial hip joint: Secondary | ICD-10-CM | POA: Diagnosis not present

## 2019-03-04 DIAGNOSIS — M25559 Pain in unspecified hip: Secondary | ICD-10-CM | POA: Diagnosis not present

## 2019-03-15 NOTE — Progress Notes (Deleted)
**  CT chest 10/21/2017>> reduced lung volumes due to cardiomegaly and a hiatal hernia.  There is mild bibasilar atelectasis.  There is mild subcarinal and right hilar lymphadenopathy.  Dilated pulmonary arteries suggestive of pulmonary hypertension.  There is a 4 mm right upper lobe nodule, unchanged from previous CT scan on 03/23/2015. **Chest x-ray 02/18/2019>> images personally viewed, hyperinflation consistent with emphysema.  Mild bibasilar interstitial changes. **CPAP titration 11/29/2011>> good response at CPAP of 7. **PSG 02/01/2011>> AHI 9.6.

## 2019-03-16 ENCOUNTER — Institutional Professional Consult (permissible substitution): Payer: PPO | Admitting: Internal Medicine

## 2019-04-29 ENCOUNTER — Telehealth: Payer: Self-pay | Admitting: Internal Medicine

## 2019-04-29 NOTE — Telephone Encounter (Signed)
Pt wanted to be seen sooner but stated that she is sick and has been for about a week with cough, runny nose, and wheezing. I advised pt that because she is having those symtoms that she may not be able to come into the office and I offered her a web visit but she does not have a smart phone or computer.

## 2019-04-29 NOTE — Telephone Encounter (Signed)
Called and spoke to pt.  Pt reports of wheezing, nasal drainage clear in color, dry cough & weakness x3-4 weeks.  Sob is baseline.  Denied fever, chills, sweats, recent travel or sick contacts.  Taking tussion & expired tessalon with mild relief in cough. Also doing albuterol 2-3x daily with some relief. Pt is requesting sooner consult then 06/04/2019. Pt has been scheduled for phone visit 04/30/2019 with DR at 9:30a. Nothing further is needed.

## 2019-04-30 ENCOUNTER — Encounter: Payer: PPO | Admitting: Internal Medicine

## 2019-04-30 ENCOUNTER — Ambulatory Visit (INDEPENDENT_AMBULATORY_CARE_PROVIDER_SITE_OTHER): Payer: PPO | Admitting: Internal Medicine

## 2019-04-30 DIAGNOSIS — J411 Mucopurulent chronic bronchitis: Secondary | ICD-10-CM

## 2019-04-30 MED ORDER — FLUTICASONE FUROATE-VILANTEROL 200-25 MCG/INH IN AEPB: 1.0000 | INHALATION_SPRAY | Freq: Every day | RESPIRATORY_TRACT | 5 refills | Status: DC

## 2019-04-30 MED ORDER — BENZONATATE 100 MG PO CAPS: 200.0000 mg | ORAL_CAPSULE | Freq: Three times a day (TID) | ORAL | 2 refills | Status: DC

## 2019-04-30 NOTE — Patient Instructions (Addendum)
Will start you on Breo inhaler once daily, rinse mouth after use. Start Tessalon (cough pills) times daily for cough.

## 2019-04-30 NOTE — Progress Notes (Signed)
 This encounter was created in error - please disregard.

## 2019-04-30 NOTE — Progress Notes (Signed)
[facility] Pulmonary Medicine Consultation     Virtual Visit via Telephone Note I connected with patient on 04/30/19 at 10:00 AM EDT by telephone and verified that I am speaking with the correct person using two identifiers.   I discussed the limitations, risks, security and privacy concerns of performing an evaluation and management service by telephone and the availability of in person appointments. I also discussed with the patient that there may be a patient responsible charge related to this service. The patient expressed understanding and agreed to proceed. I discussed the assessment and treatment plan with the patient. The patient was provided an opportunity to ask questions and all were answered. The patient agreed with the plan and demonstrated an understanding of the instructions. Please see note below for further detail.    The patient was advised to call back or seek an in-person evaluation if the symptoms worsen or if the condition fails to improve as anticipated.  I provided 35 minutes of non-face-to-face time during this encounter.   Shane Crutch, MD    Assessment and Plan:  Dyspnea with cough. - Symptoms have been present for 6 months or more.  Patient has dyspnea on exertion. - Suspect to chronic bronchitis or asthma.  Patient notes significant improvement with Tessalon and Ventolin. - We will start Brio inhaler once daily. - I have prescribed Tessalon 2 tablets 3 times daily for help with cough.  Sarcoidosis. -Per history the patient was told that she had sarcoidosis at some point in the past approximately 10 years ago.  It appears that she may have been tried on brief courses of prednisone but was not on chronic therapy.  I see no evidence of progressive disease on most recent imaging. - We will consider further work-up with ACE and metabolic panel in future.  However given her advanced relative paucity of findings I thing that age I think that aggressive  work-up/interventions for this would be unwarranted at this time. Will re-evaluate further during in-person visit.   Obstructive sleep apnea. - Currently noncompliant with CPAP. - Recommended restart CPAP.  Meds ordered this encounter  Medications  . fluticasone furoate-vilanterol (BREO ELLIPTA) 200-25 MCG/INH AEPB    Sig: Inhale 1 puff into the lungs daily. Rinse mouth after use.    Dispense:  1 each    Refill:  5  . benzonatate (TESSALON PERLES) 100 MG capsule    Sig: Take 2 capsules (200 mg total) by mouth 3 (three) times daily.    Dispense:  90 capsule    Refill:  2   Return in about 6 weeks (around 06/11/2019).    Date: 04/30/2019  MRN# [id number] Sheila Long 06-01-1924  Referring Physician: Dr. Orvan July Kuehler is a 85 y.o. old female seen in consultation for chief complaint of: dyspnea    HPI:  This visit is limited by patient's difficulty hearing, she did not have hearing aids in at the time of the visit.   The patient has been having mucus, cough, dyspnea for more than 6 months. She never been a smoker, [place]. She was told about 20 years ago that she had sarcoidosis, she thinks she was treated but unsure. She says that she has been tried on prednisone for a few weeks but it "blew me up".  She has taken tessalon which she feels has helped. She takes ventolin about once per night and feels that it helps with her breathing.   She drives a  car, she lives by herself and lives independently.    She has OSA but has not used CPAP for several months.   **Chest x-ray 02/18/2019>> imaging personally reviewed, there is mild bibasilar interstitial prominence CT chest 10/21/2017>> small bilateral effusions, cardiomegaly, otherwise unremarkable.    PMHX:   Past Medical History:  Diagnosis Date  . Allergic rhinitis due to pollen   . Anxiety   . Aortic atherosclerosis (HCC)   . CHF (congestive heart failure) (HCC)   . Coronary  artery disease    Stent in 2000's (DC)  . Dyspnea   . GERD (gastroesophageal reflux disease)   . Hard of hearing   . Hypertension   . Neuropathy   . Obstructive sleep apnea   . Osteoarthritis of both knees   . Peripheral vascular disease (HCC)   . Sarcoidosis   . Thyroid nodule    Surgical Hx:  Past Surgical History:  Procedure Laterality Date  . ABDOMINAL HYSTERECTOMY    . BIOPSY THYROID  2014   Dr Andee PolesVaught  . BREAST EXCISIONAL BIOPSY Left 1980s   surgical bx neg  . BREAST EXCISIONAL BIOPSY Left 1980s   benign  . CARDIOVASCULAR STRESS TEST  05/12/2012   with dobutamine---negative. Normal LV function  . CORONARY ANGIOPLASTY  2006  . JOINT REPLACEMENT Left 2000   hip  . JOINT REPLACEMENT Right 2007   hip  . KNEE ARTHROPLASTY Right 06/23/2017   Procedure: COMPUTER ASSISTED TOTAL KNEE ARTHROPLASTY;  Surgeon: Donato HeinzHooten, James P, MD;  Location: [facility] ORS;  Service: Orthopedics;  Laterality: Right;   Family Hx:  No family history on file. Social Hx:   Social History   Tobacco Use  . Smoking status: Never Smoker  . Smokeless tobacco: Never Used  Substance Use Topics  . Alcohol use: No  . Drug use: No   Medication:    Current Outpatient Medications:  .  amLODipine (NORVASC) 5 MG tablet, Take 5 mg by mouth daily., Disp: , Rfl: 11 .  aspirin EC 81 MG tablet, Take 81 mg by mouth every other day., Disp: , Rfl:  .  furosemide (LASIX) 20 MG tablet, Take 1 tablet (20 mg total) by mouth daily., Disp: 30 tablet, Rfl: 0 .  latanoprost (XALATAN) 0.005 % ophthalmic solution, Place 1 drop into both eyes at bedtime., Disp: , Rfl: 5 .  metoprolol succinate (TOPROL-XL) 50 MG 24 hr tablet, Take 3 tablets (150 mg total) by mouth daily. Take with or immediately following a meal., Disp: 270 tablet, Rfl: 3 .  ranitidine (ZANTAC) 150 MG tablet, Take 150 mg by mouth daily. , Disp: , Rfl:    Allergies:  Tramadol; Sulfa antibiotics; and Zocor [simvastatin]  Review of Systems: Gen:  Denies  fever,  sweats, chills HEENT: Denies blurred vision, double vision. bleeds, sore throat Cvc:  No dizziness, chest pain. Resp:   Denies cough or sputum production, shortness of breath Gi: Denies swallowing difficulty, stomach pain. Gu:  Denies bladder incontinence, burning urine Ext:   No Joint pain, stiffness. Skin: No skin rash,  hives  Endoc:  No polyuria, polydipsia. Psych: No depression, insomnia. Other:  All other systems were reviewed with the patient and were negative other that what is mentioned in the HPI.   Physical Examination:    LABORATORY PANEL:   CBC No results for input(s): WBC, HGB, HCT, PLT in the last 168 hours. ------------------------------------------------------------------------------------------------------------------  Chemistries  No results for input(s): NA, K, CL, CO2, GLUCOSE, BUN, CREATININE, CALCIUM, MG, AST, ALT, ALKPHOS,  BILITOT in the last 168 hours.  Invalid input(s): GFRCGP ------------------------------------------------------------------------------------------------------------------  Cardiac Enzymes No results for input(s): TROPONINI in the last 168 hours. ------------------------------------------------------------  RADIOLOGY:  No results found.     Thank  you for the consultation and for allowing [facility], [facility] Care to assist in the care of your patient. Our recommendations are noted above.  Please contact us if we can be of further service.   Wells Guiles, M.D., F.C.C.P.  Board Certified in Internal Medicine, Pulmonary Medicine, Critical Care Medicine, and Sleep Medicine.   Pulmonary and Critical Care Office Number: [phone]   04/30/2019

## 2019-06-01 ENCOUNTER — Institutional Professional Consult (permissible substitution): Payer: PPO | Admitting: Internal Medicine

## 2019-06-22 DIAGNOSIS — N183 Chronic kidney disease, stage 3 (moderate): Secondary | ICD-10-CM | POA: Diagnosis not present

## 2019-06-22 DIAGNOSIS — R51 Headache: Secondary | ICD-10-CM | POA: Diagnosis not present

## 2019-06-22 DIAGNOSIS — R011 Cardiac murmur, unspecified: Secondary | ICD-10-CM | POA: Diagnosis not present

## 2019-06-22 DIAGNOSIS — M179 Osteoarthritis of knee, unspecified: Secondary | ICD-10-CM | POA: Diagnosis not present

## 2019-06-22 DIAGNOSIS — R079 Chest pain, unspecified: Secondary | ICD-10-CM | POA: Diagnosis not present

## 2019-06-22 NOTE — Progress Notes (Signed)
[facility] Consultation      Assessment and Plan:  Chronic bronchitis with cough.  - Symptoms have been present for years.   - Suspect to chronic bronchitis or asthma. Continue using albuterol inhaler as needed.  --Asked to call us back if cough recurs and we can give another prescription for tessalon.   Sarcoidosis.  -Per history the patient was told that she had sarcoidosis at some point in the past approximately 10 years ago.  It appears that she may have been tried on brief courses of prednisone but was not on chronic therapy.  I see no evidence of progressive disease on most recent imaging. - Given her advanced relative paucity of findings I thing that age I think that aggressive work-up/interventions for this would be unwarranted at this time.   Obstructive Sleep Apnea. - Currently noncompliant with CPAP with daytime sleepiness. - Recommended restart CPAP.   Return in about 1 year (around 06/22/2020).    Date: 06/22/2019  MRN# [id number] Sheila Long 1924-12-03  Referring Physician: Dr. Rosina Lowenstein Lonsway is a 85 y.o. old female seen in consultation for chief complaint of: dyspnea    HPI:  .Sheila Long is a 85 y.o. female with a history of chronic cough/expectoration and dyspnea.  At previous visit she was asked to use Breo inhaler, Tessalon.  Today she continues to have dyspnea on exertion, and expectoration. She was using Breo inhaler once daily but she stopped and is now using another inhaler, which she thinks is albuterol and feels that they are more helpful. She has tried mucinex and did not help.  She continues to feel tired during the day, she is using CPAP on and off.   She has an apparent history of sarcoidosis, but given her advanced age and lack of symptoms we did not pursue further interventions.   She was noted to have a history of obstructive sleep apnea not currently on CPAP, she was encouraged to restart use of  CPAP.  She drives a car, she lives by herself and lives independently.    She has OSA but has not used CPAP for several months.   **Chest x-ray 02/18/2019>> imaging personally reviewed, there is mild bibasilar interstitial prominence CT chest 10/21/2017>> small bilateral effusions, cardiomegaly, otherwise unremarkable.   Family Hx:  No family history on file. Social Hx:   Social History   Tobacco Use   Smoking status: Never Smoker   Smokeless tobacco: Never Used  Substance Use Topics   Alcohol use: No   Drug use: No   Medication:    Current Outpatient Medications:    amLODipine (NORVASC) 5 MG tablet, Take 5 mg by mouth daily., Disp: , Rfl: 11   aspirin EC 81 MG tablet, Take 81 mg by mouth every other day., Disp: , Rfl:    benzonatate (TESSALON PERLES) 100 MG capsule, Take 2 capsules (200 mg total) by mouth 3 (three) times daily., Disp: 90 capsule, Rfl: 2   fluticasone furoate-vilanterol (BREO ELLIPTA) 200-25 MCG/INH AEPB, Inhale 1 puff into the lungs daily. Rinse mouth after use., Disp: 1 each, Rfl: 5   furosemide (LASIX) 20 MG tablet, Take 1 tablet (20 mg total) by mouth daily., Disp: 30 tablet, Rfl: 0   latanoprost (XALATAN) 0.005 % ophthalmic solution, Place 1 drop into both eyes at bedtime., Disp: , Rfl: 5   metoprolol succinate (TOPROL-XL) 50 MG 24 hr tablet, Take 3 tablets (150 mg total) by mouth daily. Take with or immediately following a  meal., Disp: 270 tablet, Rfl: 3   ranitidine (ZANTAC) 150 MG tablet, Take 150 mg by mouth daily. , Disp: , Rfl:    Allergies:  Tramadol, Sulfa antibiotics, and Zocor [simvastatin]  Review of Systems:  Constitutional: Feels well. Cardiovascular: Denies chest pain, exertional chest pain.  Pulmonary: Denies hemoptysis, pleuritic chest pain.   The remainder of systems were reviewed and were found to be negative other than what is documented in the HPI.    Physical Examination:   VS: BP (!) 160/90 (BP Location: Left Arm,  Cuff Size: Normal)    Pulse 67    Temp (!) 97.2 F (36.2 C) (Temporal)    Ht 5\' 4"  (1.626 m)    Wt 220 lb 3.2 oz (99.9 kg)    SpO2 97%    BMI 37.80 kg/m   General Appearance: No distress  Neuro:without focal findings, mental status, speech normal, alert and oriented HEENT: PERRLA, EOM intact Pulmonary: No wheezing, No rales  CardiovascularNormal S1,S2.  No m/r/g.  Abdomen: Benign, Soft, non-tender, No masses Renal:  No costovertebral tenderness  GU:  No performed at this time. Endoc: No evident thyromegaly, no signs of acromegaly or Cushing features Skin:   warm, no rashes, no ecchymosis  Extremities: normal, no cyanosis, clubbing.     LABORATORY PANEL:   CBC No results for input(s): WBC, HGB, HCT, PLT in the last 168 hours. ------------------------------------------------------------------------------------------------------------------  Chemistries  No results for input(s): NA, K, CL, CO2, GLUCOSE, BUN, CREATININE, CALCIUM, MG, AST, ALT, ALKPHOS, BILITOT in the last 168 hours.  Invalid input(s): GFRCGP ------------------------------------------------------------------------------------------------------------------  Cardiac Enzymes No results for input(s): TROPONINI in the last 168 hours. ------------------------------------------------------------  RADIOLOGY:  No results found.     Thank  you for the consultation and for allowing [facility] to assist in the care of your patient. Our recommendations are noted above.  Please contact us if we can be of further service.   Wells Guileseep Gavinn Collard, M.D., F.C.C.P.  Board Certified in Internal Medicine, Pulmonary Medicine, Critical Care Medicine, and Sleep Medicine.  [facility] Office Number: [phone]   06/22/2019

## 2019-06-23 ENCOUNTER — Ambulatory Visit: Payer: PPO | Admitting: Internal Medicine

## 2019-06-23 ENCOUNTER — Encounter: Payer: Self-pay | Admitting: Internal Medicine

## 2019-06-23 ENCOUNTER — Other Ambulatory Visit: Payer: Self-pay

## 2019-06-23 VITALS — BP 160/90 | HR 67 | Temp 97.2°F | Ht 64.0 in | Wt 220.2 lb

## 2019-06-23 DIAGNOSIS — J411 Mucopurulent chronic bronchitis: Secondary | ICD-10-CM | POA: Diagnosis not present

## 2019-06-23 DIAGNOSIS — G4719 Other hypersomnia: Secondary | ICD-10-CM

## 2019-06-23 NOTE — Patient Instructions (Signed)
Continue albuterol.  Recommend that you use cpap every night.

## 2019-06-24 DIAGNOSIS — M179 Osteoarthritis of knee, unspecified: Secondary | ICD-10-CM | POA: Diagnosis not present

## 2019-06-24 DIAGNOSIS — R079 Chest pain, unspecified: Secondary | ICD-10-CM | POA: Diagnosis not present

## 2019-06-24 DIAGNOSIS — R51 Headache: Secondary | ICD-10-CM | POA: Diagnosis not present

## 2019-06-24 DIAGNOSIS — N183 Chronic kidney disease, stage 3 (moderate): Secondary | ICD-10-CM | POA: Diagnosis not present

## 2019-06-24 DIAGNOSIS — D869 Sarcoidosis, unspecified: Secondary | ICD-10-CM | POA: Diagnosis not present

## 2019-06-25 DIAGNOSIS — E7849 Other hyperlipidemia: Secondary | ICD-10-CM | POA: Diagnosis not present

## 2019-06-25 DIAGNOSIS — I10 Essential (primary) hypertension: Secondary | ICD-10-CM | POA: Diagnosis not present

## 2019-06-25 DIAGNOSIS — E119 Type 2 diabetes mellitus without complications: Secondary | ICD-10-CM | POA: Diagnosis not present

## 2019-06-25 DIAGNOSIS — R5381 Other malaise: Secondary | ICD-10-CM | POA: Diagnosis not present

## 2019-07-08 DIAGNOSIS — S76119A Strain of unspecified quadriceps muscle, fascia and tendon, initial encounter: Secondary | ICD-10-CM | POA: Diagnosis not present

## 2019-07-08 DIAGNOSIS — D8686 Sarcoid arthropathy: Secondary | ICD-10-CM | POA: Diagnosis not present

## 2019-07-08 DIAGNOSIS — M130 Polyarthritis, unspecified: Secondary | ICD-10-CM | POA: Diagnosis not present

## 2019-07-08 DIAGNOSIS — E8881 Metabolic syndrome: Secondary | ICD-10-CM | POA: Diagnosis not present

## 2019-07-16 DIAGNOSIS — R0789 Other chest pain: Secondary | ICD-10-CM | POA: Diagnosis not present

## 2019-07-19 ENCOUNTER — Other Ambulatory Visit: Payer: Self-pay | Admitting: Internal Medicine

## 2019-07-19 DIAGNOSIS — Z1231 Encounter for screening mammogram for malignant neoplasm of breast: Secondary | ICD-10-CM

## 2019-07-22 DIAGNOSIS — N181 Chronic kidney disease, stage 1: Secondary | ICD-10-CM | POA: Diagnosis not present

## 2019-07-22 DIAGNOSIS — S76119A Strain of unspecified quadriceps muscle, fascia and tendon, initial encounter: Secondary | ICD-10-CM | POA: Diagnosis not present

## 2019-07-22 DIAGNOSIS — R259 Unspecified abnormal involuntary movements: Secondary | ICD-10-CM | POA: Diagnosis not present

## 2019-07-22 DIAGNOSIS — M25461 Effusion, right knee: Secondary | ICD-10-CM | POA: Diagnosis not present

## 2019-09-07 DIAGNOSIS — G20 Parkinson's disease: Secondary | ICD-10-CM | POA: Diagnosis not present

## 2019-09-07 DIAGNOSIS — I119 Hypertensive heart disease without heart failure: Secondary | ICD-10-CM | POA: Diagnosis not present

## 2019-09-07 DIAGNOSIS — E119 Type 2 diabetes mellitus without complications: Secondary | ICD-10-CM | POA: Diagnosis not present

## 2019-09-07 DIAGNOSIS — E8881 Metabolic syndrome: Secondary | ICD-10-CM | POA: Diagnosis not present

## 2019-09-08 DIAGNOSIS — H401132 Primary open-angle glaucoma, bilateral, moderate stage: Secondary | ICD-10-CM | POA: Diagnosis not present

## 2019-09-17 DIAGNOSIS — H903 Sensorineural hearing loss, bilateral: Secondary | ICD-10-CM | POA: Diagnosis not present

## 2019-09-17 DIAGNOSIS — H6123 Impacted cerumen, bilateral: Secondary | ICD-10-CM | POA: Diagnosis not present

## 2019-09-17 DIAGNOSIS — H6063 Unspecified chronic otitis externa, bilateral: Secondary | ICD-10-CM | POA: Diagnosis not present

## 2019-09-29 ENCOUNTER — Ambulatory Visit
Admission: RE | Admit: 2019-09-29 | Discharge: 2019-09-29 | Disposition: A | Discharge status: Home / Self Care | Payer: PPO | Source: Ambulatory Visit | Attending: Internal Medicine | Admitting: Internal Medicine

## 2019-09-29 DIAGNOSIS — Z1231 Encounter for screening mammogram for malignant neoplasm of breast: Secondary | ICD-10-CM

## 2019-10-04 DIAGNOSIS — R200 Anesthesia of skin: Secondary | ICD-10-CM | POA: Diagnosis not present

## 2019-10-05 DIAGNOSIS — R200 Anesthesia of skin: Secondary | ICD-10-CM | POA: Insufficient documentation

## 2019-11-18 DIAGNOSIS — J42 Unspecified chronic bronchitis: Secondary | ICD-10-CM | POA: Diagnosis not present

## 2019-11-18 DIAGNOSIS — Z23 Encounter for immunization: Secondary | ICD-10-CM | POA: Diagnosis not present

## 2019-11-24 ENCOUNTER — Other Ambulatory Visit: Payer: Self-pay

## 2019-12-13 ENCOUNTER — Ambulatory Visit (INDEPENDENT_AMBULATORY_CARE_PROVIDER_SITE_OTHER): Payer: PPO | Admitting: Certified Nurse Midwife

## 2019-12-13 ENCOUNTER — Other Ambulatory Visit: Payer: Self-pay

## 2019-12-13 ENCOUNTER — Encounter: Payer: Self-pay | Admitting: Certified Nurse Midwife

## 2019-12-13 VITALS — BP 160/80 | HR 66 | Ht 64.0 in | Wt 218.0 lb

## 2019-12-13 DIAGNOSIS — B9689 Other specified bacterial agents as the cause of diseases classified elsewhere: Secondary | ICD-10-CM | POA: Diagnosis not present

## 2019-12-13 DIAGNOSIS — L089 Local infection of the skin and subcutaneous tissue, unspecified: Secondary | ICD-10-CM

## 2019-12-13 DIAGNOSIS — R0789 Other chest pain: Secondary | ICD-10-CM

## 2019-12-13 MED ORDER — CEPHALEXIN 500 MG PO CAPS: 500.0000 mg | ORAL_CAPSULE | Freq: Two times a day (BID) | ORAL | 0 refills | Status: DC

## 2019-12-13 NOTE — Progress Notes (Signed)
Obstetrics & Gynecology Office Visit   Chief Complaint:  Chief Complaint  Patient presents with  . Gynecologic Exam    Bottom of incision from surgery a long time ago looks scabbed over; has gained wt - arm is flabby, hasn't lost wt.    History of Present Illness: 85 year old BF who presents as a NP with several concerns: 1) She has had transient pains in her left lateral chest wall- a couple times in the last couple months. No pain with moving her left arm or taking a deep breath. Last mammogram 09/29/19 2) She had an abdominal hysterectomy in the 1960s for menorrhagia. Recently since gaining weight, she has noticed that the caudal end of her vertical scar has been irritated. She has been putting butt cream on it and covering it with a 4X4.  3) Pain in her thighs and right sacroiliac area when she stands up. History of bilateral hip replacement and spinal stenosis. Usually sees Dr Lake Bells. Wants a second opinion.   Review of Systems:  Review of Systems  Constitutional: Negative for chills, fever and weight loss.  HENT: Positive for congestion and hearing loss (wears hearing aids). Negative for sinus pain and sore throat.   Eyes: Negative for blurred vision and pain.  Respiratory: Positive for cough and shortness of breath. Negative for hemoptysis and wheezing.   Cardiovascular: Positive for orthopnea and leg swelling. Negative for chest pain and palpitations.  Gastrointestinal: Negative for abdominal pain, blood in stool, diarrhea, heartburn, nausea and vomiting.  Genitourinary: Negative for dysuria, frequency, hematuria and urgency.  Musculoskeletal: Positive for back pain and joint pain. Negative for myalgias.  Skin: Negative for itching and rash.  Neurological: Positive for tingling (and numbness of left hand). Negative for dizziness and headaches.  Endo/Heme/Allergies: Negative for environmental allergies and polydipsia. Does not bruise/bleed easily.       Negative for hirsutism   Psychiatric/Behavioral: Negative for depression. The patient is not nervous/anxious and does not have insomnia.      Past Medical History:  Past Medical History:  Diagnosis Date  . Allergic rhinitis due to pollen   . Anxiety   . Aortic atherosclerosis ([place])   . CHF (congestive heart failure) ([place])   . Coronary artery disease    Stent in 2000's ([place])  . Dyspnea   . GERD (gastroesophageal reflux disease)   . Hard of hearing   . Hypertension   . Neuropathy   . Obstructive sleep apnea   . Osteoarthritis of both knees   . Peripheral vascular disease ([place])   . Sarcoidosis   . Thyroid nodule     Past Surgical History:  Past Surgical History:  Procedure Laterality Date  . ABDOMINAL HYSTERECTOMY    . BIOPSY THYROID  2014   Dr Pryor Ochoa  . BREAST EXCISIONAL BIOPSY Left 1980s   surgical bx neg  . BREAST EXCISIONAL BIOPSY Left 1980s   benign  . CARDIOVASCULAR STRESS TEST  05/12/2012   with dobutamine---negative. Normal LV function  . CORONARY ANGIOPLASTY  2006  . JOINT REPLACEMENT Left 2000   hip  . JOINT REPLACEMENT Right 2007   hip  . KNEE ARTHROPLASTY Right 06/23/2017   Procedure: COMPUTER ASSISTED TOTAL KNEE ARTHROPLASTY;  Surgeon: Dereck Leep, MD;  Location: [facility] ORS;  Service: Orthopedics;  Laterality: Right;    Gynecologic History: No LMP recorded. Patient is postmenopausal.  Obstetric History: G0P0  Family History:  Family History  Problem Relation Age of Onset  . Cancer Sister  85       unknown - groin area  . Heart attack Brother   . Cancer Sister 5087       pancreatic   . Heart attack Sister   . Heart attack Brother     Social History:  Social History   Socioeconomic History  . Marital status: Widowed    Spouse name: Not on file  . Number of children: 0  . Years of education: Not on file  . Highest education level: Not on file  Occupational History  . 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 Smoking status:  Never Smoker  . Smokeless tobacco: Never Used  Substance and Sexual Activity  . Alcohol use: No  . Drug use: No  . Sexual activity: Not Currently    Birth control/protection: Post-menopausal  Other Topics Concern  . Not on file  Social History Narrative   Past living will   Needs to redo health care POA   Requests sister [employer] as decision makers   Would accept resuscitation   Not sure about tube feeds   Social Determinants of Health   Financial Resource Strain:   . Difficulty of Paying Living Expenses: Not on file  Food Insecurity:   . Worried About Programme researcher, broadcasting/film/videounning Out of Food in the Last Year: Not on file  . Ran Out of Food in the Last Year: Not on file  Transportation Needs:   . Lack of Transportation (Medical): Not on file  . Lack of Transportation (Non-Medical): Not on file  Physical Activity:   . Days of Exercise per Week: Not on file  . Minutes of Exercise per Session: Not on file  Stress:   . Feeling of Stress : Not on file  Social Connections:   . Frequency of Communication with Friends and Family: Not on file  . Frequency of Social Gatherings with Friends and Family: Not on file  . Attends Religious Services: Not on file  . Active Member of Clubs or Organizations: Not on file  . Attends BankerClub or Organization Meetings: Not on file  . Marital Status: Not on file  Intimate Partner Violence:   . Fear of Current or Ex-Partner: Not on file  . Emotionally Abused: Not on file  . Physically Abused: Not on file  . Sexually Abused: Not on file    Allergies:  Allergies  Allergen Reactions  . Tramadol Other (See Comments)    CONFUSION  . Sulfa Antibiotics     Patient does not recall  . Zocor [Simvastatin] Other (See Comments)    INTOLERANCE-MYALGIAS     Medications: Prior to Admission medications   Medication Sig Start Date End Date Taking? Authorizing Provider  Acetaminophen-Codeine 300-30 MG tablet Take 1 tablet by mouth as needed.   Yes [provider]   amLODipine (NORVASC) 5 MG tablet Take 5 mg by mouth daily. 01/20/18  Yes [provider]  amoxicillin (AMOXIL) 250 MG chewable tablet Chew 1 tablet by mouth daily.   Yes [provider]  aspirin EC 81 MG tablet Take 81 mg by mouth every other day.   Yes [provider]  azelastine (ASTELIN) 0.1 % nasal spray Place 1 spray into both nostrils daily.   Yes [provider]  fluticasone furoate-vilanterol (BREO ELLIPTA) 200-25 MCG/INH AEPB Inhale 1 puff into the lungs daily. Rinse mouth after use. 04/30/19  Yes Shane Crutchamachandran, Pradeep, MD  latanoprost (XALATAN) 0.005 % ophthalmic solution Place 1 drop  into both eyes at bedtime. 05/30/17  Yes [provider]  LORazepam (ATIVAN) 0.5 MG tablet Take 0.5 mg by mouth as needed. 09/07/19  Yes [provider]  metoprolol succinate (TOPROL-XL) 50 MG 24 hr tablet Take 3 tablets (150 mg total) by mouth daily. Take with or immediately following a meal. 10/30/17  Yes Karie Schwalbe, MD  MUCINEX 600 MG 12 hr tablet Take 1 tablet by mouth 2 (two) times daily. 11/18/19  Yes [provider]         furosemide (LASIX) 20 MG tablet Take 1 tablet (20 mg total) by mouth daily. 10/21/17 10/21/18  Merrily Brittle, MD    Physical Exam Vitals:BP (!) 160/80   Pulse 66   Ht 5\' 4"  (1.626 m)   Wt 218 lb (98.9 kg)   BMI 37.42 kg/m    Physical Exam  Constitutional: She is oriented to person, place, and time. She appears well-developed and well-nourished. No distress.  Respiratory:     She exhibits no mass, no tenderness and no edema. Right breast exhibits no inverted nipple, no mass, no nipple discharge, no skin change and no tenderness. Left breast exhibits skin change (breast biopsy scars x 2 upper breast). Left breast exhibits no inverted nipple, no mass, no nipple discharge and no tenderness.    GI:  Vertical lower abdominal scar present. Just below pannus at caudal end of scar there is a 2 cm area of the  scar that is inflamed and irritated   Musculoskeletal:        General: Tenderness present.  Neurological: She is alert and oriented to person, place, and time.  Psychiatric: She has a normal mood and affect.   Wet prep of skin at lower end of scar was negative for hyphae   Assessment: 85 y.o. with possible transient chest wall pain-not present currently Possible skin infection of lower end of scar Lower back pain-recommend orthopedic second opinion   Plan: Keflex 500 mgm BID x 10 days To call me if problem persists Suggest second opinion at [facility]-Dr Dimmig  85, CNM

## 2020-01-12 DIAGNOSIS — J4540 Moderate persistent asthma, uncomplicated: Secondary | ICD-10-CM | POA: Diagnosis not present

## 2020-01-18 DIAGNOSIS — Z96641 Presence of right artificial hip joint: Secondary | ICD-10-CM | POA: Diagnosis not present

## 2020-02-11 ENCOUNTER — Other Ambulatory Visit: Payer: Self-pay

## 2020-02-11 ENCOUNTER — Ambulatory Visit: Payer: PPO | Attending: Internal Medicine

## 2020-02-11 DIAGNOSIS — Z23 Encounter for immunization: Secondary | ICD-10-CM

## 2020-02-11 NOTE — Progress Notes (Signed)
   Covid-19 Vaccination Clinic  Name:  Sheila Long    MRN: [id number] DOB: 1924-10-24  02/11/2020  [name] was observed post Covid-19 immunization for 15 minutes without incidence. She was provided with Vaccine Information Sheet and instruction to access the V-Safe system.   [name] was instructed to call 911 with any severe reactions post vaccine: Marland Kitchen Difficulty breathing  . Swelling of your face and throat  . A fast heartbeat  . A bad rash all over your body  . Dizziness and weakness    Immunizations Administered    Name Date Dose VIS Date Route   Moderna COVID-19 Vaccine 02/11/2020  1:19 PM 0.5 mL 11/30/2019 Intramuscular   Manufacturer: Moderna   Lot: [id number]   NDC: 21975-883-25

## 2020-03-03 DIAGNOSIS — G20 Parkinson's disease: Secondary | ICD-10-CM | POA: Diagnosis not present

## 2020-03-03 DIAGNOSIS — E8881 Metabolic syndrome: Secondary | ICD-10-CM | POA: Diagnosis not present

## 2020-03-03 DIAGNOSIS — I119 Hypertensive heart disease without heart failure: Secondary | ICD-10-CM | POA: Diagnosis not present

## 2020-03-03 DIAGNOSIS — E119 Type 2 diabetes mellitus without complications: Secondary | ICD-10-CM | POA: Diagnosis not present

## 2020-03-07 DIAGNOSIS — M5416 Radiculopathy, lumbar region: Secondary | ICD-10-CM | POA: Diagnosis not present

## 2020-03-07 DIAGNOSIS — M545 Low back pain: Secondary | ICD-10-CM | POA: Diagnosis not present

## 2020-03-13 ENCOUNTER — Ambulatory Visit: Payer: PPO | Attending: Internal Medicine

## 2020-03-13 DIAGNOSIS — Z23 Encounter for immunization: Secondary | ICD-10-CM

## 2020-03-13 NOTE — Progress Notes (Signed)
   Covid-19 Vaccination Clinic  Name:  Sheila Long    MRN: [id number] DOB: 01-Jan-1924  03/13/2020  Ms. Spells was observed post Covid-19 immunization for 15 minutes without incident. She was provided with Vaccine Information Sheet and instruction to access the V-Safe system.   [name] was instructed to call 911 with any severe reactions post vaccine: Marland Kitchen Difficulty breathing  . Swelling of face and throat  . A fast heartbeat  . A bad rash all over body  . Dizziness and weakness   Immunizations Administered    Name Date Dose VIS Date Route   Moderna COVID-19 Vaccine 03/13/2020  1:36 PM 0.5 mL 11/30/2019 Intramuscular   Manufacturer: Moderna   Lot: [id number]   NDC: 16606-004-59

## 2020-03-15 DIAGNOSIS — H401132 Primary open-angle glaucoma, bilateral, moderate stage: Secondary | ICD-10-CM | POA: Diagnosis not present

## 2020-03-16 DIAGNOSIS — E8881 Metabolic syndrome: Secondary | ICD-10-CM | POA: Diagnosis not present

## 2020-03-16 DIAGNOSIS — G20 Parkinson's disease: Secondary | ICD-10-CM | POA: Diagnosis not present

## 2020-03-16 DIAGNOSIS — E119 Type 2 diabetes mellitus without complications: Secondary | ICD-10-CM | POA: Diagnosis not present

## 2020-03-16 DIAGNOSIS — I119 Hypertensive heart disease without heart failure: Secondary | ICD-10-CM | POA: Diagnosis not present

## 2020-03-27 DIAGNOSIS — M545 Low back pain: Secondary | ICD-10-CM | POA: Diagnosis not present

## 2020-04-03 ENCOUNTER — Emergency Department: Payer: PPO

## 2020-04-03 ENCOUNTER — Other Ambulatory Visit: Payer: Self-pay

## 2020-04-03 ENCOUNTER — Encounter
Admission: EM | Disposition: A | Discharge status: Home / Self Care | Payer: Self-pay | Source: Home / Self Care | Attending: Emergency Medicine

## 2020-04-03 ENCOUNTER — Ambulatory Visit: Payer: PPO | Admitting: Registered Nurse

## 2020-04-03 ENCOUNTER — Ambulatory Visit
Admission: EM | Admit: 2020-04-03 | Discharge: 2020-04-03 | Disposition: A | Discharge status: Home / Self Care | Payer: PPO | Attending: Emergency Medicine | Admitting: Emergency Medicine

## 2020-04-03 DIAGNOSIS — T18128A Food in esophagus causing other injury, initial encounter: Secondary | ICD-10-CM | POA: Diagnosis not present

## 2020-04-03 DIAGNOSIS — Z96643 Presence of artificial hip joint, bilateral: Secondary | ICD-10-CM | POA: Insufficient documentation

## 2020-04-03 DIAGNOSIS — Z888 Allergy status to other drugs, medicaments and biological substances status: Secondary | ICD-10-CM | POA: Diagnosis not present

## 2020-04-03 DIAGNOSIS — I739 Peripheral vascular disease, unspecified: Secondary | ICD-10-CM | POA: Insufficient documentation

## 2020-04-03 DIAGNOSIS — X58XXXA Exposure to other specified factors, initial encounter: Secondary | ICD-10-CM | POA: Diagnosis not present

## 2020-04-03 DIAGNOSIS — I110 Hypertensive heart disease with heart failure: Secondary | ICD-10-CM | POA: Insufficient documentation

## 2020-04-03 DIAGNOSIS — J90 Pleural effusion, not elsewhere classified: Secondary | ICD-10-CM | POA: Diagnosis not present

## 2020-04-03 DIAGNOSIS — Z7982 Long term (current) use of aspirin: Secondary | ICD-10-CM | POA: Insufficient documentation

## 2020-04-03 DIAGNOSIS — H9190 Unspecified hearing loss, unspecified ear: Secondary | ICD-10-CM | POA: Insufficient documentation

## 2020-04-03 DIAGNOSIS — M170 Bilateral primary osteoarthritis of knee: Secondary | ICD-10-CM | POA: Diagnosis not present

## 2020-04-03 DIAGNOSIS — K222 Esophageal obstruction: Secondary | ICD-10-CM | POA: Diagnosis not present

## 2020-04-03 DIAGNOSIS — I10 Essential (primary) hypertension: Secondary | ICD-10-CM | POA: Diagnosis not present

## 2020-04-03 DIAGNOSIS — K209 Esophagitis, unspecified without bleeding: Secondary | ICD-10-CM | POA: Diagnosis not present

## 2020-04-03 DIAGNOSIS — Z8249 Family history of ischemic heart disease and other diseases of the circulatory system: Secondary | ICD-10-CM | POA: Insufficient documentation

## 2020-04-03 DIAGNOSIS — K219 Gastro-esophageal reflux disease without esophagitis: Secondary | ICD-10-CM | POA: Diagnosis not present

## 2020-04-03 DIAGNOSIS — Z955 Presence of coronary angioplasty implant and graft: Secondary | ICD-10-CM | POA: Insufficient documentation

## 2020-04-03 DIAGNOSIS — I2510 Atherosclerotic heart disease of native coronary artery without angina pectoris: Secondary | ICD-10-CM | POA: Diagnosis not present

## 2020-04-03 DIAGNOSIS — R1110 Vomiting, unspecified: Secondary | ICD-10-CM

## 2020-04-03 DIAGNOSIS — G4733 Obstructive sleep apnea (adult) (pediatric): Secondary | ICD-10-CM | POA: Diagnosis not present

## 2020-04-03 DIAGNOSIS — Z882 Allergy status to sulfonamides status: Secondary | ICD-10-CM | POA: Diagnosis not present

## 2020-04-03 DIAGNOSIS — R1111 Vomiting without nausea: Secondary | ICD-10-CM | POA: Diagnosis not present

## 2020-04-03 DIAGNOSIS — Z20822 Contact with and (suspected) exposure to covid-19: Secondary | ICD-10-CM | POA: Diagnosis not present

## 2020-04-03 DIAGNOSIS — I509 Heart failure, unspecified: Secondary | ICD-10-CM | POA: Diagnosis not present

## 2020-04-03 DIAGNOSIS — G629 Polyneuropathy, unspecified: Secondary | ICD-10-CM | POA: Diagnosis not present

## 2020-04-03 DIAGNOSIS — R112 Nausea with vomiting, unspecified: Secondary | ICD-10-CM | POA: Diagnosis not present

## 2020-04-03 DIAGNOSIS — I5032 Chronic diastolic (congestive) heart failure: Secondary | ICD-10-CM | POA: Diagnosis not present

## 2020-04-03 DIAGNOSIS — Z79899 Other long term (current) drug therapy: Secondary | ICD-10-CM | POA: Diagnosis not present

## 2020-04-03 HISTORY — PX: ESOPHAGOGASTRODUODENOSCOPY (EGD) WITH PROPOFOL: SHX5813

## 2020-04-03 LAB — COMPREHENSIVE METABOLIC PANEL
ALT: 8 U/L (ref 0–44)
AST: 22 U/L (ref 15–41)
Albumin: 4.4 g/dL (ref 3.5–5.0)
Alkaline Phosphatase: 69 U/L (ref 38–126)
Anion gap: 9 (ref 5–15)
BUN: 17 mg/dL (ref 8–23)
CO2: 24 mmol/L (ref 22–32)
Calcium: 8.8 mg/dL — ABNORMAL LOW (ref 8.9–10.3)
Chloride: 110 mmol/L (ref 98–111)
Creatinine, Ser: 1.01 mg/dL — ABNORMAL HIGH (ref 0.44–1.00)
GFR calc Af Amer: 55 mL/min — ABNORMAL LOW (ref >60)
GFR calc non Af Amer: 48 mL/min — ABNORMAL LOW (ref >60)
Glucose, Bld: 129 mg/dL — ABNORMAL HIGH (ref 70–99)
Potassium: 4.7 mmol/L (ref 3.5–5.1)
Sodium: 143 mmol/L (ref 135–145)
Total Bilirubin: 1.8 mg/dL — ABNORMAL HIGH (ref 0.3–1.2)
Total Protein: 7.6 g/dL (ref 6.5–8.1)

## 2020-04-03 LAB — CBC
HCT: 42 % (ref 36.0–46.0)
Hemoglobin: 13.9 g/dL (ref 12.0–15.0)
MCH: 33.4 pg (ref 26.0–34.0)
MCHC: 33.1 g/dL (ref 30.0–36.0)
MCV: 101 fL — ABNORMAL HIGH (ref 80.0–100.0)
Platelets: 173 10*3/uL (ref 150–400)
RBC: 4.16 MIL/uL (ref 3.87–5.11)
RDW: 14.4 % (ref 11.5–15.5)
WBC: 7 10*3/uL (ref 4.0–10.5)
nRBC: 0 % (ref 0.0–0.2)

## 2020-04-03 LAB — LIPASE, BLOOD: Lipase: 30 U/L (ref 11–51)

## 2020-04-03 LAB — POC SARS CORONAVIRUS 2 AG: SARS Coronavirus 2 Ag: NEGATIVE

## 2020-04-03 SURGERY — ESOPHAGOGASTRODUODENOSCOPY (EGD) WITH PROPOFOL
Anesthesia: General

## 2020-04-03 MED ORDER — DEXAMETHASONE SODIUM PHOSPHATE 4 MG/ML IJ SOLN
INTRAMUSCULAR | Status: AC
  Filled 2020-04-03: qty 1

## 2020-04-03 MED ORDER — FENTANYL CITRATE (PF) 100 MCG/2ML IJ SOLN
INTRAMUSCULAR | Status: DC | PRN
  Administered 2020-04-03: 25 ug via INTRAVENOUS

## 2020-04-03 MED ORDER — SUCCINYLCHOLINE CHLORIDE 200 MG/10ML IV SOSY
PREFILLED_SYRINGE | INTRAVENOUS | Status: AC
  Filled 2020-04-03: qty 10

## 2020-04-03 MED ORDER — SUCCINYLCHOLINE CHLORIDE 20 MG/ML IJ SOLN
INTRAMUSCULAR | Status: DC | PRN
  Administered 2020-04-03: 100 mg via INTRAVENOUS

## 2020-04-03 MED ORDER — ONDANSETRON HCL 4 MG/2ML IJ SOLN
INTRAMUSCULAR | Status: DC | PRN
  Administered 2020-04-03: 4 mg via INTRAVENOUS

## 2020-04-03 MED ORDER — ALBUTEROL SULFATE HFA 108 (90 BASE) MCG/ACT IN AERS
INHALATION_SPRAY | RESPIRATORY_TRACT | Status: AC
  Filled 2020-04-03: qty 6.7

## 2020-04-03 MED ORDER — SODIUM CHLORIDE 0.9 % IV SOLN: INTRAVENOUS | Status: DC | PRN

## 2020-04-03 MED ORDER — ONDANSETRON HCL 4 MG/2ML IJ SOLN
INTRAMUSCULAR | Status: AC
  Filled 2020-04-03: qty 2

## 2020-04-03 MED ORDER — SODIUM CHLORIDE 0.9 % IV SOLN
INTRAVENOUS | Status: DC
  Administered 2020-04-03: 1000 mL via INTRAVENOUS

## 2020-04-03 MED ORDER — PHENYLEPHRINE HCL (PRESSORS) 10 MG/ML IV SOLN
INTRAVENOUS | Status: DC | PRN
  Administered 2020-04-03 (×3): 100 ug via INTRAVENOUS

## 2020-04-03 MED ORDER — DEXAMETHASONE SODIUM PHOSPHATE 10 MG/ML IJ SOLN
INTRAMUSCULAR | Status: DC | PRN
  Administered 2020-04-03: 8 mg via INTRAVENOUS

## 2020-04-03 MED ORDER — ALBUTEROL SULFATE HFA 108 (90 BASE) MCG/ACT IN AERS
INHALATION_SPRAY | RESPIRATORY_TRACT | Status: DC | PRN
  Administered 2020-04-03: 2 via RESPIRATORY_TRACT

## 2020-04-03 MED ORDER — LIDOCAINE HCL (PF) 2 % IJ SOLN
INTRAMUSCULAR | Status: AC
  Filled 2020-04-03: qty 10

## 2020-04-03 MED ORDER — FENTANYL CITRATE (PF) 100 MCG/2ML IJ SOLN
INTRAMUSCULAR | Status: AC
  Filled 2020-04-03: qty 2

## 2020-04-03 MED ORDER — PROPOFOL 10 MG/ML IV BOLUS
INTRAVENOUS | Status: DC | PRN
  Administered 2020-04-03: 80 mg via INTRAVENOUS

## 2020-04-03 MED ORDER — LIDOCAINE HCL (CARDIAC) PF 100 MG/5ML IV SOSY
PREFILLED_SYRINGE | INTRAVENOUS | Status: DC | PRN
  Administered 2020-04-03: 100 mg via INTRAVENOUS

## 2020-04-03 MED ORDER — ONDANSETRON HCL 4 MG/2ML IJ SOLN: 4.0000 mg | Freq: Once | INTRAMUSCULAR | Status: DC | PRN

## 2020-04-03 NOTE — Anesthesia Postprocedure Evaluation (Signed)
Anesthesia Post Note  [name]  Procedure(s) Performed: ESOPHAGOGASTRODUODENOSCOPY (EGD) WITH PROPOFOL (N/A )  Patient location during evaluation: PACU Anesthesia Type: General Level of consciousness: awake and alert Pain management: pain level controlled Vital Signs Assessment: post-procedure vital signs reviewed and stable Respiratory status: spontaneous breathing, nonlabored ventilation, respiratory function stable and patient connected to nasal cannula oxygen Cardiovascular status: blood pressure returned to baseline and stable Postop Assessment: no apparent nausea or vomiting Anesthetic complications: no     Last Vitals:  Vitals:   04/03/20 1704 04/03/20 1719  BP: (!) 146/72 (!) 162/83  Pulse: 77 74  Resp: (!) 29 (!) 27  Temp:  (!) 36.3 C  SpO2: 95% 95%    Last Pain:  Vitals:   04/03/20 1719  TempSrc:   PainSc: 0-No pain                 Corinda Gubler

## 2020-04-03 NOTE — ED Notes (Signed)
Pt having active episodes of vomiting. Small amounts. Pt otherwise in NAD. Resp reg/unlabored. A&Ox4.

## 2020-04-03 NOTE — Op Note (Signed)
[facility] Gastroenterology Patient Name: Sheila Long Procedure Date: 04/03/2020 4:19 PM MRN: [id number] Account #: [id number] Date of Birth: Sep 26, 1924 Admit Type: Outpatient Age: 86 Room: [facility] ENDO ROOM 4 Gender: Female Note Status: Finalized Procedure:             Upper GI endoscopy Indications:           Foreign body in the esophagus Providers:             [name] MD, MD Medicines:             Monitored Anesthesia Care Complications:         No immediate complications. Procedure:             Pre-Anesthesia Assessment:                        - Prior to the procedure, a History and Physical was                         performed, and patient medications, allergies and                         sensitivities were reviewed. The patient's tolerance                         of previous anesthesia was reviewed.                        - The risks and benefits of the procedure and the                         sedation options and risks were discussed with the                         patient. All questions were answered and informed                         consent was obtained.                        - ASA Grade Assessment: III - A patient with severe                         systemic disease.                        After obtaining informed consent, the endoscope was                         passed under direct vision. Throughout the procedure,                         the patient's blood pressure, pulse, and oxygen                         saturations were monitored continuously. The Endoscope                         was introduced through the mouth, and advanced to the  third part of duodenum. The upper GI endoscopy was                         accomplished without difficulty. The patient tolerated                         the procedure well. Findings:      Food was found at the gastroesophageal junction. Removal of food was   accomplished. Removal of food was accomplished.      One benign-appearing, intrinsic moderate stenosis was found at the       gastroesophageal junction. This stenosis measured 1.2 cm (inner       diameter) x 1 cm (in length). The stenosis was traversed.      LA Grade B (one or more mucosal breaks greater than 5 mm, not extending       between the tops of two mucosal folds) esophagitis with no bleeding was       found at the gastroesophageal junction.      The stomach was normal.      The examined duodenum was normal.      The cardia and gastric fundus were normal on retroflexion.      sigmoidization of the lower esophagus concerning for achalasia Impression:            - Food at the gastroesophageal junction. Removal was                         successful.                        - Benign-appearing esophageal stenosis.                        - LA Grade B esophagitis with no bleeding.                        - Normal stomach.                        - Normal examined duodenum. Recommendation:        - Discharge patient to home (with escort).                        - Resume previous diet.                        - Continue present medications.                        - Use Prilosec (omeprazole) 40 mg PO daily for 6 weeks.                        - Return to my office in 4 weeks. Procedure Code(s):     --- Professional ---                        [phone], Esophagogastroduodenoscopy, flexible,                         transoral; with removal of foreign body(s) Diagnosis Code(s):     --- Professional ---  G64.403K, Food in esophagus causing other injury,                         initial encounter                        K22.2, Esophageal obstruction                        K20.90, Esophagitis, unspecified without bleeding                        T18.108A, Unspecified foreign body in esophagus                         causing other injury, initial encounter CPT copyright 2019  American Medical Association. All rights reserved. The codes documented in this report are preliminary and upon coder review may  be revised to meet current compliance requirements. [name], MD [name] MD, MD 04/03/2020 4:41:03 PM This report has been signed electronically. Number of Addenda: 0 Note Initiated On: 04/03/2020 4:19 PM Estimated Blood Loss:  Estimated blood loss: none.      [facility]

## 2020-04-03 NOTE — ED Provider Notes (Signed)
[facility] Emergency Department Provider Note   ____________________________________________   First MD Initiated Contact with Patient 04/03/20 1404     (approximate)  I have reviewed the triage vital signs and the nursing notes.  HISTORY  Chief Complaint Emesis  HPI Sheila Long is a 86 y.o. female here for evaluation with a feeling like something is stuck in her lower chest  Patient reports that she had a snack last night of ham and [place].  Was about 9 PM, shortly thereafter she felt like after she swallowed it something felt stuck in her lower chest.  Since then she is vomited a couple of times discount of watery emesis.  This morning she got up still had the feeling, tried to drink some tea and shortly after drinking that she had to throw it back up  No abdominal pain.  No fevers or chills.  No recent illness.  No known Covid exposure  No chest pain no trouble breathing.  She reports she just feels like there is a stuck sensation below her breastbone, she is thinking something could be stuck in her esophagus   Past Medical History:  Diagnosis Date  . Allergic rhinitis due to pollen   . Anxiety   . Aortic atherosclerosis (HCC)   . CHF (congestive heart failure) (HCC)   . Coronary artery disease    Stent in 2000's (DC)  . Dyspnea   . GERD (gastroesophageal reflux disease)   . Hard of hearing   . Hypertension   . Neuropathy   . Obstructive sleep apnea   . Osteoarthritis of both knees   . Peripheral vascular disease (HCC)   . Sarcoidosis   . Thyroid nodule     Patient Active Problem List   Diagnosis Date Noted  . Axillary abscess 11/12/2018  . Prediabetes 10/30/2017  . Chronic diastolic heart failure (HCC) 10/29/2017  . Anxiety 10/29/2017  . Fatigue 07/29/2017  . Mood disorder (HCC) 07/29/2017  . S/P total knee arthroplasty 06/23/2017  . Pulmonary hypertension associated with sarcoidosis (HCC) 02/03/2017  . Lower extremity  edema 11/28/2016  . Familial tremor 09/24/2016  . Sarcoidosis   . Hypertension   . Allergic rhinitis due to pollen   . Coronary artery disease   . Aortic atherosclerosis (HCC)   . GERD (gastroesophageal reflux disease)   . Peripheral vascular disease (HCC)   . Osteoarthritis of both knees   . Obstructive sleep apnea   . Neuropathy     Past Surgical History:  Procedure Laterality Date  . ABDOMINAL HYSTERECTOMY    . BIOPSY THYROID  2014   Dr Andee Poles  . BREAST EXCISIONAL BIOPSY Left 1980s   surgical bx neg  . BREAST EXCISIONAL BIOPSY Left 1980s   benign  . CARDIOVASCULAR STRESS TEST  05/12/2012   with dobutamine---negative. Normal LV function  . CORONARY ANGIOPLASTY  2006  . JOINT REPLACEMENT Left 2000   hip  . JOINT REPLACEMENT Right 2007   hip  . KNEE ARTHROPLASTY Right 06/23/2017   Procedure: COMPUTER ASSISTED TOTAL KNEE ARTHROPLASTY;  Surgeon: Donato Heinz, MD;  Location: [facility] ORS;  Service: Orthopedics;  Laterality: Right;    Prior to Admission medications   Medication Sig Start Date End Date Taking? Authorizing Provider  Acetaminophen-Codeine 300-30 MG tablet Take 1 tablet by mouth as needed.    [provider]  amLODipine (NORVASC) 5 MG tablet Take 5 mg by mouth daily. 01/20/18   [provider]  amoxicillin (AMOXIL) 250 MG chewable  tablet Chew 1 tablet by mouth daily.    [provider]  aspirin EC 81 MG tablet Take 81 mg by mouth every other day.    [provider]  azelastine (ASTELIN) 0.1 % nasal spray Place 1 spray into both nostrils daily.    [provider]  fluticasone furoate-vilanterol (BREO ELLIPTA) 200-25 MCG/INH AEPB Inhale 1 puff into the lungs daily. Rinse mouth after use. 04/30/19   Shane Crutch, MD  furosemide (LASIX) 20 MG tablet Take 1 tablet (20 mg total) by mouth daily. 10/21/17 10/21/18  Merrily Brittle, MD  latanoprost (XALATAN) 0.005 % ophthalmic solution Place 1 drop into both eyes at bedtime.  05/30/17   [provider]  LORazepam (ATIVAN) 0.5 MG tablet Take 0.5 mg by mouth as needed. 09/07/19   [provider]  metoprolol succinate (TOPROL-XL) 50 MG 24 hr tablet Take 3 tablets (150 mg total) by mouth daily. Take with or immediately following a meal. 10/30/17   Karie Schwalbe, MD  MUCINEX 600 MG 12 hr tablet Take 1 tablet by mouth 2 (two) times daily. 11/18/19   [provider]    Allergies Tramadol, Sulfa antibiotics, and Zocor [simvastatin]  Family History  Problem Relation Age of Onset  . Cancer Sister 22       unknown - groin area  . Heart attack Brother   . Cancer Sister 60       pancreatic   . Heart attack Sister   . Heart attack Brother     Social History Social History   Tobacco Use  . Smoking status: Never Smoker  . Smokeless tobacco: Never Used  Substance Use Topics  . Alcohol use: No  . Drug use: No    Review of Systems Constitutional: No fever/chills Eyes: No visual changes. ENT: No sore throat. Cardiovascular: Denies chest pain. Respiratory: Denies shortness of breath. Gastrointestinal: No abdominal pain.   Genitourinary: Negative for dysuria. Musculoskeletal: Negative for back pain. Skin: Negative for rash. Neurological: Negative for headaches, areas of focal weakness or numbness.    ____________________________________________   PHYSICAL EXAM:  VITAL SIGNS: ED Triage Vitals [04/03/20 1408]  Enc Vitals Group     BP      Pulse      Resp      Temp      Temp src      SpO2      Weight      Height      Head Circumference      Peak Flow      Pain Score 0     Pain Loc      Pain Edu?      Excl. in GC?     Constitutional: Alert and oriented. Well appearing and in no acute distress. Eyes: Conjunctivae are normal. Head: Atraumatic. Nose: No congestion/rhinnorhea. Mouth/Throat: Mucous membranes are moist. Neck: No stridor.  Cardiovascular: Normal rate, regular rhythm. Grossly normal heart sounds.  Good  peripheral circulation. Respiratory: Normal respiratory effort.  No retractions. Lungs CTAB. Gastrointestinal: Soft and nontender. No distention. Musculoskeletal: No lower extremity tenderness nor edema. Neurologic:  Normal speech and language. No gross focal neurologic deficits are appreciated.  Skin:  Skin is warm, dry and intact. No rash noted. Psychiatric: Mood and affect are normal. Speech and behavior are normal. Of note, the patient does feel like she has to cough up some of her saliva.  Has emesis bag in hand, but not actively vomiting at this time.  ____________________________________________  LABS (all labs ordered are listed, but only abnormal results are displayed)  Labs Reviewed  COMPREHENSIVE METABOLIC PANEL - Abnormal; Notable for the following components:      Result Value   Glucose, Bld 129 (*)    Creatinine, Ser 1.01 (*)    Calcium 8.8 (*)    Total Bilirubin 1.8 (*)    GFR calc non Af Amer 48 (*)    GFR calc Af Amer 55 (*)    All other components within normal limits  CBC - Abnormal; Notable for the following components:   MCV 101.0 (*)    All other components within normal limits  LIPASE, BLOOD  URINALYSIS, COMPLETE (UACMP) WITH MICROSCOPIC  POC SARS CORONAVIRUS 2 AG -  ED  POC SARS CORONAVIRUS 2 AG  SAMPLE TO BLOOD BANK   ____________________________________________  EKG  ED ECG REPORT I, Delman Kitten, the attending physician, personally viewed and interpreted this ECG.  Date: 04/03/2020 EKG Time: 1430 Rate: 80 Rhythm: normal sinus rhythm QRS Axis: normal Intervals: normal ST/T Wave abnormalities: normal Narrative Interpretation: no evidence of acute ischemia  ____________________________________________  RADIOLOGY  DG Abdomen Acute W/Chest  Result Date: 04/03/2020 CLINICAL DATA:  Vomiting EXAM: DG ABDOMEN ACUTE W/ 1V CHEST COMPARISON:  None. FINDINGS: There is no evidence of dilated bowel loops or free intraperitoneal air. There is however a  relative paucity of bowel gas seen within the mid abdomen. Air is seen down to the level of the rectum. No radiopaque calculi or other significant radiographic abnormality is seen. Heart size and mediastinal contours are within normal limits. Both lungs are clear. Bilateral total hip arthroplasties are noted. There is blunting of the bilateral costophrenic angles which could be trace effusions. Aortic knob calcifications. IMPRESSION: Nonspecific bowel gas pattern, however no dilated loops of bowel. Trace bilateral pleural effusions. Electronically Signed   By: Prudencio Pair M.D.   On: 04/03/2020 15:26    Imaging reviewed, nonspecific bowel gas pattern.  No dilated loops.  Trace bilateral pleural effusions  ____________________________________________   PROCEDURES  Procedure(s) performed: None  Procedures  Critical Care performed: No  ____________________________________________   INITIAL IMPRESSION / ASSESSMENT AND PLAN / ED COURSE  Pertinent labs & imaging results that were available during my care of the patient were reviewed by me and considered in my medical decision making (see chart for details).   Patient presents for evaluation of feeling a stuck sensation after eating last night  She reports that she has not been able to keep fluids down the she will throw them back up shortly after drinking are trying to eat today.  Tried also oatmeal and tea today and both she threw up shortly after.  Describes a stuck sensation, mid to low sternum.  No difficulty breathing.  She is handling and managing her secretions here, but when given anything to drink she vomits it back up not shortly after taking it  No abdominal pain.  No evidence of obstruction on imaging.  Clinical Course as of Apr 03 1553  Mon Apr 03, 2020  1416 Had patient trial drinking some ginger ale, but shortly after having it she did throw this back up.  Have paged GI for consultation   [MQ]  1422 Discussed with Dr.  Vicente Males. Await labs, cxr. May need to go for EGD   [MQ]    Clinical Course User Index [MQ] Delman Kitten, MD   No cardiac or pulmonary symptoms.  No known exposure to Covid.  No infectious symptoms  -----------------------------------------  3:51 PM on 04/03/2020 -----------------------------------------  GI Dr. Tobi Bastos to evaluate the patient, possibly perform upper endoscopy to evaluate for possible esophageal obstruction based on her clinical history.  Patient being taken to endoscopy under the care of Dr. Tobi Bastos and the OR team.  Patient and family the bedside understanding with this  ____________________________________________   FINAL CLINICAL IMPRESSION(S) / ED DIAGNOSES  Final diagnoses:  Vomiting, intractability of vomiting not specified, presence of nausea not specified, unspecified vomiting type          Note:  This document was prepared using Dragon voice recognition software and may include unintentional dictation errors       Sharyn Creamer, MD 04/03/20 1554

## 2020-04-03 NOTE — Consult Note (Signed)
Jonathon Bellows , MD [address] [place], [place], [place], [address] [address] [place], [place], [place], [address] Phone: [phone]  Fax: [phone]  Consultation  Referring Provider:     Dr Jacqualine Code  Primary Care Physician:  Cletis Athens, MD Primary Gastroenterologist:  None          Reason for Consultation:     Food impaction  Date of Admission:  04/03/2020 Date of Consultation:  04/03/2020         HPI:   Sheila Long is a 86 y.o. female presented to the emergency room with history of inability to swallow her food after dinner last night.  She says she has had issues with difficulty swallowing and food getting stuck for many years but never had an issue with bringing her into the hospital.  After dinner last night could not eat any other food.  She had ham, [place], string beans, mac & cheese.  Denies any recent prior endoscopy.  Denies any use of blood thinners.  Past Medical History:  Diagnosis Date  . Allergic rhinitis due to pollen   . Anxiety   . Aortic atherosclerosis ([place])   . CHF (congestive heart failure) ([place])   . Coronary artery disease    Stent in 2000's (DC)  . Dyspnea   . GERD (gastroesophageal reflux disease)   . Hard of hearing   . Hypertension   . Neuropathy   . Obstructive sleep apnea   . Osteoarthritis of both knees   . Peripheral vascular disease ([place])   . Sarcoidosis   . Thyroid nodule     Past Surgical History:  Procedure Laterality Date  . ABDOMINAL HYSTERECTOMY    . BIOPSY THYROID  2014   Dr Pryor Ochoa  . BREAST EXCISIONAL BIOPSY Left 1980s   surgical bx neg  . BREAST EXCISIONAL BIOPSY Left 1980s   benign  . CARDIOVASCULAR STRESS TEST  05/12/2012   with dobutamine---negative. Normal LV function  . CORONARY ANGIOPLASTY  2006  . JOINT REPLACEMENT Left 2000   hip  . JOINT REPLACEMENT Right 2007   hip  . KNEE ARTHROPLASTY Right 06/23/2017   Procedure: COMPUTER ASSISTED TOTAL KNEE ARTHROPLASTY;  Surgeon: Dereck Leep, MD;   Location: [facility] ORS;  Service: Orthopedics;  Laterality: Right;    Prior to Admission medications   Medication Sig Start Date End Date Taking? Authorizing Provider  Acetaminophen-Codeine 300-30 MG tablet Take 1 tablet by mouth as needed.    [provider]  amLODipine (NORVASC) 5 MG tablet Take 5 mg by mouth daily. 01/20/18   [provider]  amoxicillin (AMOXIL) 250 MG chewable tablet Chew 1 tablet by mouth daily.    [provider]  aspirin EC 81 MG tablet Take 81 mg by mouth every other day.    [provider]  azelastine (ASTELIN) 0.1 % nasal spray Place 1 spray into both nostrils daily.    [provider]  cephALEXin (KEFLEX) 500 MG capsule Take 1 capsule (500 mg total) by mouth 2 (two) times daily. 12/13/19   Dalia Heading, CNM  fluticasone furoate-vilanterol (BREO ELLIPTA) 200-25 MCG/INH AEPB Inhale 1 puff into the lungs daily. Rinse mouth after use. 04/30/19   Laverle Hobby, MD  furosemide (LASIX) 20 MG tablet Take 1 tablet (20 mg total) by mouth daily. 10/21/17 10/21/18  Darel Hong, MD  latanoprost (XALATAN) 0.005 % ophthalmic solution Place 1 drop into both eyes at bedtime. 05/30/17   [provider]  LORazepam (ATIVAN) 0.5  MG tablet Take 0.5 mg by mouth as needed. 09/07/19   [provider]  metoprolol succinate (TOPROL-XL) 50 MG 24 hr tablet Take 3 tablets (150 mg total) by mouth daily. Take with or immediately following a meal. 10/30/17   Venia Carbon, MD  MUCINEX 600 MG 12 hr tablet Take 1 tablet by mouth 2 (two) times daily. 11/18/19   [provider]    Family History  Problem Relation Age of Onset  . Cancer Sister 55       unknown - groin area  . Heart attack Brother   . Cancer Sister 72       pancreatic   . Heart attack Sister   . Heart attack Brother      Social History   Tobacco Use  . Smoking status: Never Smoker  . Smokeless tobacco: Never Used  Substance Use Topics  .  Alcohol use: No  . Drug use: No    Allergies as of 04/03/2020 - Review Complete 04/03/2020  Allergen Reaction Noted  . Tramadol Other (See Comments) 04/24/2016  . Sulfa antibiotics  10/27/2013  . Zocor [simvastatin] Other (See Comments) 04/04/2015    Review of Systems:    All systems reviewed and negative except where noted in HPI.   Physical Exam:  Vital signs in last 24 hours: Temp:  [98.1 F (36.7 C)] 98.1 F (36.7 C) (04/05 1415) Pulse Rate:  [70] 70 (04/05 1415) Resp:  [18] 18 (04/05 1415) BP: (181)/(81) 181/81 (04/05 1415) SpO2:  [98 %] 98 % (04/05 1415) Weight:  [98 kg] 98 kg (04/05 1414)   General:   Pleasant, cooperative in NAD Head:  Normocephalic and atraumatic. Eyes:   No icterus.   Conjunctiva pink. PERRLA. Ears:  Normal auditory acuity. Neck:  Supple; no masses or thyroidomegaly Lungs: Respirations even and unlabored. Lungs clear to auscultation bilaterally.   No wheezes, crackles, or rhonchi.  Heart:  Regular rate and rhythm;  Without murmur, clicks, rubs or gallops Abdomen:  Soft, nondistended, nontender. Normal bowel sounds. No appreciable masses or hepatomegaly.  No rebound or guarding.  Neurologic:  Alert and oriented x3;  grossly normal neurologically. SPsych:  Alert and cooperative. Normal affect.  LAB RESULTS: Recent Labs    04/03/20 1422  WBC 7.0  HGB 13.9  HCT 42.0  PLT 173   BMET No results for input(s): NA, K, CL, CO2, GLUCOSE, BUN, CREATININE, CALCIUM in the last 72 hours. LFT No results for input(s): PROT, ALBUMIN, AST, ALT, ALKPHOS, BILITOT, BILIDIR, IBILI in the last 72 hours. PT/INR No results for input(s): LABPROT, INR in the last 72 hours.  STUDIES: No results found.    Impression / Plan:   Sheila Long is a 86 y.o. y/o female with dysphagia likely a food impaction  Plan 1.  EGD   I have discussed alternative options, risks & benefits,  which include, but are not limited to, bleeding, infection,  perforation,respiratory complication & drug reaction.  The patient agrees with this plan & written consent will be obtained.    Thank you for involving me in the care of this patient.      LOS: 0 days   Jonathon Bellows, MD  04/03/2020, 2:52 PM

## 2020-04-03 NOTE — ED Triage Notes (Signed)
Pt to ED via [facility] from home. Per EMS pt began vomiting at 9pm last night. Pt stating she feels like something is stuck in her throat. Pt last ate at 4pm yesterday. Pt denies diarrhea or CP. Pt stating she feels SOB but that has been x87month. Pt hx HTN.   EMS VS:  BP 192/88 O2 99% HR 89 NSR  Per MD pt given ginger ale and pt unable to keep it down.

## 2020-04-03 NOTE — ED Notes (Signed)
Rapid Covid NEGATIVE

## 2020-04-03 NOTE — H&P (Signed)
Jonathon Bellows, MD [address], [place], [place], [place], [address] [address], [place], [place], [place], [address] Phone: [phone]  Fax: [phone]  Primary Care Physician:  Cletis Athens, MD   Pre-Procedure History & Physical: HPI:  Sheila Long is a 86 y.o. female is here for an endoscopy    Past Medical History:  Diagnosis Date  . Allergic rhinitis due to pollen   . Anxiety   . Aortic atherosclerosis ([place])   . CHF (congestive heart failure) ([place])   . Coronary artery disease    Stent in 2000's ([place])  . Dyspnea   . GERD (gastroesophageal reflux disease)   . Hard of hearing   . Hypertension   . Neuropathy   . Obstructive sleep apnea   . Osteoarthritis of both knees   . Peripheral vascular disease ([place])   . Sarcoidosis   . Thyroid nodule     Past Surgical History:  Procedure Laterality Date  . ABDOMINAL HYSTERECTOMY    . BIOPSY THYROID  2014   Dr Pryor Ochoa  . BREAST EXCISIONAL BIOPSY Left 1980s   surgical bx neg  . BREAST EXCISIONAL BIOPSY Left 1980s   benign  . CARDIOVASCULAR STRESS TEST  05/12/2012   with dobutamine---negative. Normal LV function  . CORONARY ANGIOPLASTY  2006  . JOINT REPLACEMENT Left 2000   hip  . JOINT REPLACEMENT Right 2007   hip  . KNEE ARTHROPLASTY Right 06/23/2017   Procedure: COMPUTER ASSISTED TOTAL KNEE ARTHROPLASTY;  Surgeon: Dereck Leep, MD;  Location: [facility] ORS;  Service: Orthopedics;  Laterality: Right;    Prior to Admission medications   Medication Sig Start Date End Date Taking? Authorizing Provider  Acetaminophen-Codeine 300-30 MG tablet Take 1 tablet by mouth as needed.    [provider]  amLODipine (NORVASC) 5 MG tablet Take 5 mg by mouth daily. 01/20/18   [provider]  amoxicillin (AMOXIL) 250 MG chewable tablet Chew 1 tablet by mouth daily.    [provider]  aspirin EC 81 MG tablet Take 81 mg by mouth every other day.    [provider]  azelastine (ASTELIN)  0.1 % nasal spray Place 1 spray into both nostrils daily.    [provider]  fluticasone furoate-vilanterol (BREO ELLIPTA) 200-25 MCG/INH AEPB Inhale 1 puff into the lungs daily. Rinse mouth after use. 04/30/19   Laverle Hobby, MD  furosemide (LASIX) 20 MG tablet Take 1 tablet (20 mg total) by mouth daily. 10/21/17 10/21/18  Darel Hong, MD  latanoprost (XALATAN) 0.005 % ophthalmic solution Place 1 drop into both eyes at bedtime. 05/30/17   [provider]  LORazepam (ATIVAN) 0.5 MG tablet Take 0.5 mg by mouth as needed. 09/07/19   [provider]  metoprolol succinate (TOPROL-XL) 50 MG 24 hr tablet Take 3 tablets (150 mg total) by mouth daily. Take with or immediately following a meal. 10/30/17   Venia Carbon, MD  MUCINEX 600 MG 12 hr tablet Take 1 tablet by mouth 2 (two) times daily. 11/18/19   [provider]    Allergies as of 04/03/2020 - Review Complete 04/03/2020  Allergen Reaction Noted  . Tramadol Other (See Comments) 04/24/2016  . Sulfa antibiotics  10/27/2013  . Zocor [simvastatin] Other (See Comments) 04/04/2015    Family History  Problem Relation Age of Onset  . Cancer Sister 90       unknown - groin area  . Heart attack Brother   . Cancer Sister 71  pancreatic   . Heart attack Sister   . Heart attack Brother     Social History   Socioeconomic History  . Marital status: Widowed    Spouse name: Not on file  . Number of children: 0  . Years of education: Not on file  . Highest education level: Not on file  Occupational History  . 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 Smoking status: Never Smoker  . Smokeless tobacco: Never Used  Substance and Sexual Activity  . Alcohol use: No  . Drug use: No  . Sexual activity: Not Currently    Birth control/protection: Post-menopausal  Other Topics Concern  . Not on file  Social History Narrative   Past living will   Needs to redo  health care POA   Requests sister [employer] as decision makers   Would accept resuscitation   Not sure about tube feeds   Social Determinants of Health   Financial Resource Strain:   . Difficulty of Paying Living Expenses:   Food Insecurity:   . Worried About Programme researcher, broadcasting/film/video in the Last Year:   . Barista in the Last Year:   Transportation Needs:   . Freight forwarder (Medical):   Marland Kitchen Lack of Transportation (Non-Medical):   Physical Activity:   . Days of Exercise per Week:   . Minutes of Exercise per Session:   Stress:   . Feeling of Stress :   Social Connections:   . Frequency of Communication with Friends and Family:   . Frequency of Social Gatherings with Friends and Family:   . Attends Religious Services:   . Active Member of Clubs or Organizations:   . Attends Banker Meetings:   Marland Kitchen Marital Status:   Intimate Partner Violence:   . Fear of Current or Ex-Partner:   . Emotionally Abused:   Marland Kitchen Physically Abused:   . Sexually Abused:     Review of Systems: See HPI, otherwise negative ROS  Physical Exam: BP (!) 158/86   Pulse 76   Temp 97.8 F (36.6 C) (Tympanic)   Resp 18   Ht 5\' 4"  (1.626 m)   Wt 98.9 kg   SpO2 100%   BMI 37.42 kg/m  General:   Alert,  pleasant and cooperative in NAD Head:  Normocephalic and atraumatic. Neck:  Supple; no masses or thyromegaly. Lungs:  Clear throughout to auscultation, normal respiratory effort.    Heart:  +S1, +S2, Regular rate and rhythm, No edema. Abdomen:  Soft, nontender and nondistended. Normal bowel sounds, without guarding, and without rebound.   Neurologic:  Alert and  oriented x4;  grossly normal neurologically.  Impression/Plan: Sheila Long is here for an endoscopy  to be performed for  evaluation of food impaction    Risks, benefits, limitations, and alternatives regarding endoscopy have been reviewed with the patient.  Questions have been answered.  All parties  agreeable.   [name], MD  04/03/2020, 4:08 PM

## 2020-04-03 NOTE — Anesthesia Preprocedure Evaluation (Addendum)
Anesthesia Evaluation  Patient identified by MRN, date of birth, ID band Patient awake    Reviewed: Allergy & Precautions, NPO status , Patient's Chart, lab work & pertinent test results  History of Anesthesia Complications Negative for: history of anesthetic complications  Airway Mallampati: I  TM Distance: >3 FB Neck ROM: Full    Dental no notable dental hx. (+) Teeth Intact, Dental Advisory Given   Pulmonary shortness of breath and with exertion, asthma , sleep apnea and Continuous Positive Airway Pressure Ventilation , neg COPD, Patient abstained from smoking.Not current smoker,  Chronic bronchitis, Pulmonary sarcoidosis    + decreased breath sounds      Cardiovascular Exercise Tolerance: Good METShypertension, Pt. on medications + CAD, + Cardiac Stents and +CHF  (-) Past MI (-) dysrhythmias + Valvular Problems/Murmurs MR  Rhythm:Regular Rate:Normal - Systolic murmurs TTE 2017: Left ventricle: The cavity size was normal. There was mild  concentric hypertrophy. Systolic function was normal. The  estimated ejection fraction was in the range of 55% to 60%. Wall  motion was normal; there were no regional wall motion  abnormalities. Doppler parameters are consistent with abnormal  left ventricular relaxation (grade 1 diastolic dysfunction).  - Aortic valve: There was mild regurgitation.  - Aorta: Ascending aortic diameter: 39 mm (S).  - Mitral valve: There was moderate regurgitation.  - Left atrium: The atrium was mildly dilated.  - Tricuspid valve: There was mild regurgitation.  - Pulmonary arteries: Systolic pressure was mildly increased. PA  peak pressure: 38 mm Hg (S).    Neuro/Psych PSYCHIATRIC DISORDERS Anxiety negative neurological ROS     GI/Hepatic GERD  Controlled,(+)     (-) substance abuse  ,   Endo/Other  neg diabetes  Renal/GU negative Renal ROS     Musculoskeletal   Abdominal   Peds  Hematology   Anesthesia Other Findings Past Medical History: No date: Allergic rhinitis due to pollen No date: Anxiety No date: Aortic atherosclerosis (HCC) No date: CHF (congestive heart failure) (HCC) No date: Coronary artery disease     Comment:  Stent in 2000's (DC) No date: Dyspnea No date: GERD (gastroesophageal reflux disease) No date: Hard of hearing No date: Hypertension No date: Neuropathy No date: Obstructive sleep apnea No date: Osteoarthritis of both knees No date: Peripheral vascular disease (HCC) No date: Sarcoidosis No date: Thyroid nodule  Reproductive/Obstetrics                            Anesthesia Physical Anesthesia Plan  ASA: III and emergent  Anesthesia Plan: General   Post-op Pain Management:    Induction: Intravenous, Rapid sequence and Cricoid pressure planned  PONV Risk Score and Plan: 3 and Ondansetron, Dexamethasone and Treatment may vary due to age or medical condition  Airway Management Planned: Oral ETT  Additional Equipment: None  Intra-op Plan:   Post-operative Plan: Extubation in OR  Informed Consent: I have reviewed the patients History and Physical, chart, labs and discussed the procedure including the risks, benefits and alternatives for the proposed anesthesia with the patient or authorized representative who has indicated his/her understanding and acceptance.     Dental advisory given  Plan Discussed with: CRNA and Surgeon  Anesthesia Plan Comments: (Discussed risks of anesthesia with patient, including PONV, sore throat, lip/dental damage, aspiration. Rare risks discussed as well, such as cardiorespiratory and neurological sequelae. Patient understands.)        Anesthesia Quick Evaluation

## 2020-04-03 NOTE — ED Notes (Signed)
Visitor at bedside with pt. Report given to Endo. Rapid covid swab taken instead of 2hr test per Endo's request.

## 2020-04-03 NOTE — Transfer of Care (Signed)
Immediate Anesthesia Transfer of Care Note  [name]  Procedure(s) Performed: ESOPHAGOGASTRODUODENOSCOPY (EGD) WITH PROPOFOL (N/A )  Patient Location: PACU  Anesthesia Type:General  Level of Consciousness: drowsy  Airway & Oxygen Therapy: Patient Spontanous Breathing and Patient connected to face mask oxygen  Post-op Assessment: Report given to RN and Post -op Vital signs reviewed and stable  Post vital signs: Reviewed and stable  Last Vitals:  Vitals Value Taken Time  BP 162/78 04/03/20 1649  Temp 36.6 C 04/03/20 1649  Pulse 75 04/03/20 1649  Resp 40 04/03/20 1649  SpO2 100 % 04/03/20 1649    Last Pain:  Vitals:   04/03/20 1649  TempSrc:   PainSc: Asleep         Complications: No apparent anesthesia complications

## 2020-04-03 NOTE — Anesthesia Procedure Notes (Signed)
Procedure Name: Intubation Date/Time: 04/03/2020 4:23 PM Performed by: Lynden Oxford, CRNA Pre-anesthesia Checklist: Patient identified, Emergency Drugs available, Suction available and Patient being monitored Patient Re-evaluated:Patient Re-evaluated prior to induction Oxygen Delivery Method: Circle system utilized Preoxygenation: Pre-oxygenation with 100% oxygen Induction Type: IV induction Ventilation: Mask ventilation without difficulty Laryngoscope Size: McGraph and 3 Grade View: Grade I Tube type: Oral Tube size: 7.0 mm Number of attempts: 1 Airway Equipment and Method: Stylet,  Oral airway and Video-laryngoscopy Placement Confirmation: ETT inserted through vocal cords under direct vision,  positive ETCO2 and breath sounds checked- equal and bilateral Secured at: 20 cm Tube secured with: Tape Dental Injury: Teeth and Oropharynx as per pre-operative assessment  Difficulty Due To: Difficulty was anticipated Comments: Patient vomited up clear liquid immediateedly after induction, airway suctioned and video laryngoscopy with ETT insertion performed as fast as safely possible to minimize aspiration risk

## 2020-04-04 ENCOUNTER — Encounter: Payer: Self-pay | Admitting: *Deleted

## 2020-04-10 ENCOUNTER — Telehealth: Payer: Self-pay | Admitting: Gastroenterology

## 2020-04-10 NOTE — Telephone Encounter (Signed)
Patient called & l/m on v/m stating she needs to make a 6wk f/u appt with DR Thurston Hole (f/u ed (DR Tobi Bastos did procedure on 04-03-2020 )

## 2020-04-11 DIAGNOSIS — I10 Essential (primary) hypertension: Secondary | ICD-10-CM | POA: Diagnosis not present

## 2020-04-17 DIAGNOSIS — M5416 Radiculopathy, lumbar region: Secondary | ICD-10-CM | POA: Diagnosis not present

## 2020-04-27 DIAGNOSIS — Z1331 Encounter for screening for depression: Secondary | ICD-10-CM | POA: Diagnosis not present

## 2020-04-27 DIAGNOSIS — D860 Sarcoidosis of lung: Secondary | ICD-10-CM | POA: Diagnosis not present

## 2020-05-02 DIAGNOSIS — E559 Vitamin D deficiency, unspecified: Secondary | ICD-10-CM | POA: Diagnosis not present

## 2020-05-02 DIAGNOSIS — R200 Anesthesia of skin: Secondary | ICD-10-CM | POA: Diagnosis not present

## 2020-05-02 DIAGNOSIS — F39 Unspecified mood [affective] disorder: Secondary | ICD-10-CM | POA: Diagnosis not present

## 2020-05-02 DIAGNOSIS — J42 Unspecified chronic bronchitis: Secondary | ICD-10-CM | POA: Diagnosis not present

## 2020-05-02 DIAGNOSIS — I2729 Other secondary pulmonary hypertension: Secondary | ICD-10-CM | POA: Diagnosis not present

## 2020-05-02 DIAGNOSIS — R7309 Other abnormal glucose: Secondary | ICD-10-CM | POA: Diagnosis not present

## 2020-05-02 DIAGNOSIS — R202 Paresthesia of skin: Secondary | ICD-10-CM | POA: Diagnosis not present

## 2020-05-02 DIAGNOSIS — D869 Sarcoidosis, unspecified: Secondary | ICD-10-CM | POA: Diagnosis not present

## 2020-05-02 DIAGNOSIS — I739 Peripheral vascular disease, unspecified: Secondary | ICD-10-CM | POA: Diagnosis not present

## 2020-05-02 DIAGNOSIS — N1832 Chronic kidney disease, stage 3b: Secondary | ICD-10-CM | POA: Diagnosis not present

## 2020-05-02 DIAGNOSIS — I10 Essential (primary) hypertension: Secondary | ICD-10-CM | POA: Diagnosis not present

## 2020-05-15 DIAGNOSIS — H6123 Impacted cerumen, bilateral: Secondary | ICD-10-CM | POA: Diagnosis not present

## 2020-05-15 DIAGNOSIS — H903 Sensorineural hearing loss, bilateral: Secondary | ICD-10-CM | POA: Diagnosis not present

## 2020-05-23 DIAGNOSIS — D869 Sarcoidosis, unspecified: Secondary | ICD-10-CM | POA: Diagnosis not present

## 2020-05-23 DIAGNOSIS — E538 Deficiency of other specified B group vitamins: Secondary | ICD-10-CM | POA: Diagnosis not present

## 2020-05-23 DIAGNOSIS — Z0000 Encounter for general adult medical examination without abnormal findings: Secondary | ICD-10-CM | POA: Diagnosis not present

## 2020-05-23 DIAGNOSIS — I2729 Other secondary pulmonary hypertension: Secondary | ICD-10-CM | POA: Diagnosis not present

## 2020-05-23 DIAGNOSIS — F39 Unspecified mood [affective] disorder: Secondary | ICD-10-CM | POA: Diagnosis not present

## 2020-05-23 DIAGNOSIS — N1832 Chronic kidney disease, stage 3b: Secondary | ICD-10-CM | POA: Diagnosis not present

## 2020-05-23 DIAGNOSIS — I10 Essential (primary) hypertension: Secondary | ICD-10-CM | POA: Diagnosis not present

## 2020-05-23 DIAGNOSIS — J42 Unspecified chronic bronchitis: Secondary | ICD-10-CM | POA: Diagnosis not present

## 2020-05-23 DIAGNOSIS — R200 Anesthesia of skin: Secondary | ICD-10-CM | POA: Diagnosis not present

## 2020-06-01 ENCOUNTER — Ambulatory Visit: Payer: PPO | Admitting: Gastroenterology

## 2020-06-26 ENCOUNTER — Encounter: Payer: Self-pay | Admitting: Gastroenterology

## 2020-06-26 ENCOUNTER — Ambulatory Visit: Payer: PPO | Admitting: Gastroenterology

## 2020-06-26 ENCOUNTER — Other Ambulatory Visit: Payer: Self-pay

## 2020-06-26 VITALS — BP 138/74 | HR 86 | Temp 97.7°F | Wt 211.1 lb

## 2020-06-26 DIAGNOSIS — K222 Esophageal obstruction: Secondary | ICD-10-CM | POA: Diagnosis not present

## 2020-06-26 DIAGNOSIS — K5909 Other constipation: Secondary | ICD-10-CM

## 2020-06-26 DIAGNOSIS — R1310 Dysphagia, unspecified: Secondary | ICD-10-CM

## 2020-06-26 DIAGNOSIS — R1319 Other dysphagia: Secondary | ICD-10-CM

## 2020-06-26 NOTE — Progress Notes (Signed)
[name] MD, MRCP(U.K) [address]  [address]  [place], [place] [address]  Main: [phone]  Fax: [phone]   Primary Care Physician: [name], MD  Primary Gastroenterologist:  Dr. [name]   Here to follow-up for recent admission for dysphagia and food impaction  HPI: Sheila Long is a 86 y.o. female   She presented to the emergency room on 04/03/2020 with a food impaction.  She has had issues with swallowing for years but never ended up in the hospital for the same reason.  After her dinner could not swallow and felt like food was stuck in her esophagus and subsequently I performed an endoscopy that day and noticed a large piece of meat impacted at the lower end of her esophagus which I took out.  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  LA grade B esophagitis was noted.  I did not dilate the stricture on that day due to the esophagitis.   Since her hospitalization she continues to have on and off dysphagia.  For solids.  She takes her Prilosec daily.  She also complains of constipation and is on MiraLAX and Metamucil and would like something stronger.  Current Outpatient Medications  Medication Sig Dispense Refill  . Acetaminophen-Codeine 300-30 MG tablet Take 1 tablet by mouth as needed.    . ALPRAZolam (XANAX) 0.25 MG tablet Take by mouth.    Marland Kitchen amLODipine (NORVASC) 5 MG tablet Take 5 mg by mouth daily.  11  . amoxicillin (AMOXIL) 250 MG chewable tablet Chew 1 tablet by mouth daily.    Marland Kitchen aspirin EC 81 MG tablet Take 81 mg by mouth every other day.    Marland Kitchen azelastine (ASTELIN) 0.1 % nasal spray Place 1 spray into both nostrils daily.    . budesonide-formoterol (SYMBICORT) 160-4.5 MCG/ACT inhaler SMARTSIG:2 Inhalation Via Inhaler Twice Daily    . busPIRone (BUSPAR) 5 MG tablet     . cyanocobalamin (,VITAMIN B-12,) 1000 MCG/ML injection Inject into the muscle.    Marland Kitchen doxazosin (CARDURA) 4 MG tablet Take 4 mg by  mouth daily.    . ergocalciferol (VITAMIN D2) 1.25 MG (50000 UT) capsule Take by mouth.    . fluticasone furoate-vilanterol (BREO ELLIPTA) 200-25 MCG/INH AEPB Inhale 1 puff into the lungs daily. Rinse mouth after use. 1 each 5  . hydrochlorothiazide (HYDRODIURIL) 50 MG tablet     . latanoprost (XALATAN) 0.005 % ophthalmic solution Place 1 drop into both eyes at bedtime.  5  . LORazepam (ATIVAN) 0.5 MG tablet Take 0.5 mg by mouth as needed.    . methocarbamol (ROBAXIN-750) 750 MG tablet Robaxin-750  750 mg tablet  Take 1 tablet every day by oral route at bedtime.    . metoprolol succinate (TOPROL-XL) 50 MG 24 hr tablet Take 3 tablets (150 mg total) by mouth daily. Take with or immediately following a meal. 270 tablet 3  . montelukast (SINGULAIR) 10 MG tablet Take by mouth.    . MUCINEX 600 MG 12 hr tablet Take 1 tablet by mouth 2 (two) times daily.    Marland Kitchen olmesartan-hydrochlorothiazide (BENICAR HCT) 40-12.5 MG tablet Take 1 tablet by mouth daily.    Marland Kitchen omeprazole (PRILOSEC) 20 MG capsule Take by mouth.    . predniSONE (DELTASONE) 20 MG tablet prednisone 20 mg tablet  TAKE 1 TABLET BY MOUTH EVERY DAY    . furosemide (LASIX) 20 MG tablet Take 1 tablet (20 mg total) by  mouth daily. 30 tablet 0   No current facility-administered medications for this visit.    Allergies as of 06/26/2020 - Review Complete 06/26/2020  Allergen Reaction Noted  . Tramadol Other (See Comments) 04/24/2016  . Sulfa antibiotics  10/27/2013  . Zocor [simvastatin] Other (See Comments) 04/04/2015    ROS:  General: Negative for anorexia, weight loss, fever, chills, fatigue, weakness. ENT: Negative for hoarseness, difficulty swallowing , nasal congestion. CV: Negative for chest pain, angina, palpitations, dyspnea on exertion, peripheral edema.  Respiratory: Negative for dyspnea at rest, dyspnea on exertion, cough, sputum, wheezing.  GI: See history of present illness. GU:  Negative for dysuria, hematuria, urinary  incontinence, urinary frequency, nocturnal urination.  Endo: Negative for unusual weight change.    Physical Examination:   BP 138/74 (BP Location: Left Arm, Patient Position: Sitting, Cuff Size: Normal)   Pulse 86   Temp 97.7 F (36.5 C) (Oral)   Wt 211 lb 2 oz (95.8 kg)   BMI 36.24 kg/m   General: Well-nourished, well-developed in no acute distress.  Eyes: No icterus. Conjunctivae pink. Mouth: Oropharyngeal mucosa moist and pink , no lesions erythema or exudate. Extremities: No lower extremity edema. No clubbing or deformities. Neuro: Alert and oriented x 3.  Grossly intact. Skin: Warm and dry, no jaundice.   Psych: Alert and cooperative, normal mood and affect.   Imaging Studies: No results found.  Assessment and Plan:   Sheila Long is a 86 y.o. y/o female scented to the hospital in April 2021 with with food impaction.  Performed an endoscopy and remove the food impaction.  She had a benign stricture at the GE junction.  I could not dilated on that day as she had moderate to severe esophagitis.  I explained to her that she would benefit from endoscopy and dilation of the stricture which would help her to swallow but she was not very keen and said she will let me know in a couple of weeks if she changes her mind.  She was keen on having her constipation treated which I suggested she could try a course of Linzess 72 mcg, samples have been provided.  I will call her back in 2 weeks to see how she is doing as well as speak to her again about endoscopy and dilation.    Dr [name]  MD,MRCP [facility]) Follow up in 2 weeks to 3 weeks telephone visit

## 2020-07-04 DIAGNOSIS — M792 Neuralgia and neuritis, unspecified: Secondary | ICD-10-CM | POA: Diagnosis not present

## 2020-07-04 DIAGNOSIS — R600 Localized edema: Secondary | ICD-10-CM | POA: Diagnosis not present

## 2020-07-04 DIAGNOSIS — M4807 Spinal stenosis, lumbosacral region: Secondary | ICD-10-CM | POA: Diagnosis not present

## 2020-07-08 ENCOUNTER — Encounter: Payer: Self-pay | Admitting: Intensive Care

## 2020-07-08 ENCOUNTER — Other Ambulatory Visit: Payer: Self-pay

## 2020-07-08 ENCOUNTER — Emergency Department: Payer: PPO

## 2020-07-08 ENCOUNTER — Emergency Department
Admission: EM | Admit: 2020-07-08 | Discharge: 2020-07-08 | Disposition: A | Discharge status: Home / Self Care | Payer: PPO | Attending: Emergency Medicine | Admitting: Emergency Medicine

## 2020-07-08 DIAGNOSIS — R200 Anesthesia of skin: Secondary | ICD-10-CM | POA: Diagnosis not present

## 2020-07-08 DIAGNOSIS — Z79899 Other long term (current) drug therapy: Secondary | ICD-10-CM | POA: Diagnosis not present

## 2020-07-08 DIAGNOSIS — Z7982 Long term (current) use of aspirin: Secondary | ICD-10-CM | POA: Insufficient documentation

## 2020-07-08 DIAGNOSIS — I110 Hypertensive heart disease with heart failure: Secondary | ICD-10-CM | POA: Insufficient documentation

## 2020-07-08 DIAGNOSIS — Z955 Presence of coronary angioplasty implant and graft: Secondary | ICD-10-CM | POA: Insufficient documentation

## 2020-07-08 DIAGNOSIS — R079 Chest pain, unspecified: Secondary | ICD-10-CM | POA: Insufficient documentation

## 2020-07-08 DIAGNOSIS — I2510 Atherosclerotic heart disease of native coronary artery without angina pectoris: Secondary | ICD-10-CM | POA: Diagnosis not present

## 2020-07-08 DIAGNOSIS — R0789 Other chest pain: Secondary | ICD-10-CM | POA: Diagnosis not present

## 2020-07-08 DIAGNOSIS — I5032 Chronic diastolic (congestive) heart failure: Secondary | ICD-10-CM | POA: Insufficient documentation

## 2020-07-08 LAB — CBC
HCT: 36.1 % (ref 36.0–46.0)
Hemoglobin: 12.2 g/dL (ref 12.0–15.0)
MCH: 33.5 pg (ref 26.0–34.0)
MCHC: 33.8 g/dL (ref 30.0–36.0)
MCV: 99.2 fL (ref 80.0–100.0)
Platelets: 188 10*3/uL (ref 150–400)
RBC: 3.64 MIL/uL — ABNORMAL LOW (ref 3.87–5.11)
RDW: 14.1 % (ref 11.5–15.5)
WBC: 6 10*3/uL (ref 4.0–10.5)
nRBC: 0 % (ref 0.0–0.2)

## 2020-07-08 LAB — TROPONIN I (HIGH SENSITIVITY)
Troponin I (High Sensitivity): 7 ng/L (ref <18)
Troponin I (High Sensitivity): 7 ng/L (ref <18)

## 2020-07-08 LAB — BASIC METABOLIC PANEL
Anion gap: 8 (ref 5–15)
BUN: 24 mg/dL — ABNORMAL HIGH (ref 8–23)
CO2: 27 mmol/L (ref 22–32)
Calcium: 8.8 mg/dL — ABNORMAL LOW (ref 8.9–10.3)
Chloride: 104 mmol/L (ref 98–111)
Creatinine, Ser: 1.27 mg/dL — ABNORMAL HIGH (ref 0.44–1.00)
GFR calc Af Amer: 42 mL/min — ABNORMAL LOW (ref >60)
GFR calc non Af Amer: 36 mL/min — ABNORMAL LOW (ref >60)
Glucose, Bld: 160 mg/dL — ABNORMAL HIGH (ref 70–99)
Potassium: 3.8 mmol/L (ref 3.5–5.1)
Sodium: 139 mmol/L (ref 135–145)

## 2020-07-08 NOTE — ED Notes (Signed)
1500 vital signs not 1430. Mistake in time when charting

## 2020-07-08 NOTE — Discharge Instructions (Addendum)
Please follow-up with Dr. Nemiah Commander for the hand numbness.  Wear the wrist splint she gave you.  Make sure that they do the nerve conduction tests and EMG that she ordered.  If you have not done it let her know and she will fix that for you.  Please give Dr. Jasmine December or Dr. Gwen Pounds a call on Monday morning.  Let them know that you were seen in the emergency room for chest pain.  This should be able to get you any fairly quickly.  Please call [phone] and return if your pain comes back and lasts more than 5 or 10 minutes.  Do the same if you have any other symptoms of bad shortness of breath nausea or weakness.

## 2020-07-08 NOTE — ED Provider Notes (Signed)
[facility] Emergency Department Provider Note   ____________________________________________   First MD Initiated Contact with Patient 07/08/20 1344     (approximate)  I have reviewed the triage vital signs and the nursing notes.   HISTORY  Chief Complaint Chest Pain, Numbness, and Nausea    HPI Sheila Long is a 86 y.o. female patient complains of numbness in her left distal forearm and wrist.  This has been going on for some time.  Patient wants to know what it is.  I can explain it to her.  She has instructions from her doctor for nerve conduction study and EMG and has been told by couple different doctors to wear splints at night.  Patient also complains of swelling in the left foot this been going on for some time.  This is also been investigated before.  Patient also complains of pain in her chest it comes up from the arm where it is numb and goes into the chest it comes and goes.  Nothing she does or does not do makes it better or worse.  It feels somewhat tight.  Last several minutes and goes away.  It is associated with some worsening of her baseline shortness of breath and possibly some nausea.  This chest pain has been going since earlier today.         Past Medical History:  Diagnosis Date  . Allergic rhinitis due to pollen   . Anxiety   . Aortic atherosclerosis (HCC)   . CHF (congestive heart failure) (HCC)   . Coronary artery disease    Stent in 2000's (DC)  . Dyspnea   . GERD (gastroesophageal reflux disease)   . Hard of hearing   . Hypertension   . Neuropathy   . Obstructive sleep apnea   . Osteoarthritis of both knees   . Peripheral vascular disease (HCC)   . Sarcoidosis   . Thyroid nodule     Patient Active Problem List   Diagnosis Date Noted  . Axillary abscess 11/12/2018  . Prediabetes 10/30/2017  . Chronic diastolic heart failure (HCC) 10/29/2017  . Anxiety 10/29/2017  . Fatigue 07/29/2017  . Mood disorder  (HCC) 07/29/2017  . S/P total knee arthroplasty 06/23/2017  . Pulmonary hypertension associated with sarcoidosis (HCC) 02/03/2017  . Lower extremity edema 11/28/2016  . Familial tremor 09/24/2016  . Sarcoidosis   . Hypertension   . Allergic rhinitis due to pollen   . Coronary artery disease   . Aortic atherosclerosis (HCC)   . GERD (gastroesophageal reflux disease)   . Peripheral vascular disease (HCC)   . Osteoarthritis of both knees   . Obstructive sleep apnea   . Neuropathy     Past Surgical History:  Procedure Laterality Date  . ABDOMINAL HYSTERECTOMY    . BIOPSY THYROID  2014   Dr Andee Poles  . BREAST EXCISIONAL BIOPSY Left 1980s   surgical bx neg  . BREAST EXCISIONAL BIOPSY Left 1980s   benign  . CARDIOVASCULAR STRESS TEST  05/12/2012   with dobutamine---negative. Normal LV function  . CORONARY ANGIOPLASTY  2006  . ESOPHAGOGASTRODUODENOSCOPY (EGD) WITH PROPOFOL N/A 04/03/2020   Procedure: ESOPHAGOGASTRODUODENOSCOPY (EGD) WITH PROPOFOL;  Surgeon: [name], MD;  Location: [facility] ENDOSCOPY;  Service: Gastroenterology;  Laterality: N/A;  . JOINT REPLACEMENT Left 2000   hip  . JOINT REPLACEMENT Right 2007   hip  . KNEE ARTHROPLASTY Right 06/23/2017   Procedure: COMPUTER ASSISTED TOTAL KNEE ARTHROPLASTY;  Surgeon: Donato Heinz, MD;  Location:  [facility] ORS;  Service: Orthopedics;  Laterality: Right;    Prior to Admission medications   Medication Sig Start Date End Date Taking? Authorizing Provider  Acetaminophen-Codeine 300-30 MG tablet Take 1 tablet by mouth as needed.    [provider]  ALPRAZolam Prudy Feeler) 0.25 MG tablet Take by mouth.    [provider]  amLODipine (NORVASC) 5 MG tablet Take 5 mg by mouth daily. 01/20/18   [provider]  amoxicillin (AMOXIL) 250 MG chewable tablet Chew 1 tablet by mouth daily.    [provider]  aspirin EC 81 MG tablet Take 81 mg by mouth every other day.    [provider]  azelastine (ASTELIN)  0.1 % nasal spray Place 1 spray into both nostrils daily.    [provider]  budesonide-formoterol [facility]) 160-4.5 MCG/ACT inhaler SMARTSIG:2 Inhalation Via Inhaler Twice Daily 01/12/20   [provider]  busPIRone (BUSPAR) 5 MG tablet  04/27/20   [provider]  cyanocobalamin (,VITAMIN B-12,) 1000 MCG/ML injection Inject into the muscle. 05/23/20   [provider]  doxazosin (CARDURA) 4 MG tablet Take 4 mg by mouth daily. 03/03/20   [provider]  ergocalciferol (VITAMIN D2) 1.25 MG (50000 UT) capsule Take by mouth. 05/03/20 07/20/20  [provider]  fluticasone furoate-vilanterol (BREO ELLIPTA) 200-25 MCG/INH AEPB Inhale 1 puff into the lungs daily. Rinse mouth after use. 04/30/19   Shane Crutch, MD  furosemide (LASIX) 20 MG tablet Take 1 tablet (20 mg total) by mouth daily. 10/21/17 10/21/18  Merrily Brittle, MD  hydrochlorothiazide (HYDRODIURIL) 50 MG tablet  04/11/20   [provider]  latanoprost (XALATAN) 0.005 % ophthalmic solution Place 1 drop into both eyes at bedtime. 05/30/17   [provider]  LORazepam (ATIVAN) 0.5 MG tablet Take 0.5 mg by mouth as needed. 09/07/19   [provider]  methocarbamol (ROBAXIN-750) 750 MG tablet Robaxin-750  750 mg tablet  Take 1 tablet every day by oral route at bedtime.    [provider]  metoprolol succinate (TOPROL-XL) 50 MG 24 hr tablet Take 3 tablets (150 mg total) by mouth daily. Take with or immediately following a meal. 10/30/17   Karie Schwalbe, MD  montelukast (SINGULAIR) 10 MG tablet Take by mouth. 04/27/20 04/27/21  [provider]  MUCINEX 600 MG 12 hr tablet Take 1 tablet by mouth 2 (two) times daily. 11/18/19   [provider]  olmesartan-hydrochlorothiazide (BENICAR HCT) 40-12.5 MG tablet Take 1 tablet by mouth daily. 06/23/20   [provider]  omeprazole (PRILOSEC) 20 MG capsule Take by mouth.    [provider]  predniSONE (DELTASONE) 20 MG tablet prednisone 20 mg tablet  TAKE 1 TABLET BY MOUTH EVERY DAY    [provider]    Allergies Tramadol, Sulfa antibiotics, and Zocor [simvastatin]  Family History  Problem Relation Age of Onset  . Cancer Sister 64       unknown - groin area  . Heart attack Brother   . Cancer Sister 1       pancreatic   . Heart attack Sister   . Heart attack Brother     Social History Social History   Tobacco Use  . Smoking status: Never Smoker  . Smokeless tobacco: Never Used  Vaping Use  . Vaping Use: Never used  Substance Use Topics  . Alcohol use: No  . Drug use: No    Review of Systems  Constitutional: No fever/chills Eyes: No visual  changes. ENT: No sore throat. Cardiovascular:  chest pain. Respiratory:  shortness of breath. Gastrointestinal: No abdominal pain.nausea, no vomiting.  No diarrhea.  No constipation. Genitourinary: Negative for dysuria. Musculoskeletal: Some chronic back pain. Skin: Negative for rash. Neurological: Negative for headaches, focal weakness  ____________________________________________   PHYSICAL EXAM:  VITAL SIGNS: ED Triage Vitals  Enc Vitals Group     BP 07/08/20 1123 (!) 142/51     Pulse Rate 07/08/20 1123 70     Resp 07/08/20 1123 16     Temp 07/08/20 1123 97.7 F (36.5 C)     Temp Source 07/08/20 1123 Oral     SpO2 07/08/20 1123 100 %     Weight 07/08/20 1123 210 lb (95.3 kg)     Height 07/08/20 1123 5\' 4"  (1.626 m)     Head Circumference --      Peak Flow --      Pain Score 07/08/20 1140 0     Pain Loc --      Pain Edu? --      Excl. in GC? --     Constitutional: Alert and oriented. Well appearing and in no acute distress. Eyes: Conjunctivae are normal.  Head: Atraumatic. Nose: No congestion/rhinnorhea. Mouth/Throat: Mucous membranes are moist.  Oropharynx non-erythematous. Neck: No stridor.  Cardiovascular: Normal rate, regular rhythm. Grossly normal heart sounds.  Good  peripheral circulation. Respiratory: Normal respiratory effort.  No retractions. Lungs CTAB. Gastrointestinal: Soft and nontender. No distention. No abdominal bruits. No CVA tenderness. Musculoskeletal: No lower extremity tenderness trace bilateral edema.   Neurologic:  Normal speech and language. No new gross focal neurologic deficits are appreciated.  Skin:  Skin is warm, dry and intact. No rash noted.   ____________________________________________   LABS (all labs ordered are listed, but only abnormal results are displayed)  Labs Reviewed  BASIC METABOLIC PANEL - Abnormal; Notable for the following components:      Result Value   Glucose, Bld 160 (*)    BUN 24 (*)    Creatinine, Ser 1.27 (*)    Calcium 8.8 (*)    GFR calc non Af Amer 36 (*)    GFR calc Af Amer 42 (*)    All other components within normal limits  CBC - Abnormal; Notable for the following components:   RBC 3.64 (*)    All other components within normal limits  TROPONIN I (HIGH SENSITIVITY)  TROPONIN I (HIGH SENSITIVITY)   ____________________________________________  EKG EKG read interpreted by me shows normal sinus rhythm rate of 66 left axis nonspecific ST-T wave changes similar to EKG from April.  ____________________________________________  RADIOLOGY  ED MD interpretation: Chest x-ray read by radiology reviewed by me is negative  Official radiology report(s): DG Chest 2 View  Result Date: 07/08/2020 CLINICAL DATA:  Left arm and left leg numbness. EXAM: CHEST - 2 VIEW COMPARISON:  February 18, 2019 FINDINGS: Calcified atherosclerosis in the tortuous aorta, stable. The hila, mediastinum, and heart are otherwise normal. No pneumothorax. No nodules or masses. No focal infiltrates. No acute abnormalities are identified. IMPRESSION: No active cardiopulmonary disease. Electronically Signed   By: Gerome Samavid  Williams III M.D   On: 07/08/2020 12:28     ____________________________________________   PROCEDURES  Procedure(s) performed (including Critical Care):  Procedures   ____________________________________________   INITIAL IMPRESSION / ASSESSMENT AND PLAN / ED COURSE Patient reports pain does not last very long although I cannot get a good idea of exactly how long that is.  It does  not happen very often now in the emergency room.  Discussed with her admission versus going home and following up the beginning of the week.  Patient would rather go home.  This is okay with me.  Her troponins are negative her EKG is unchanged her chest pain is not apparently typical does not seem to last more than a few minutes.               ____________________________________________   FINAL CLINICAL IMPRESSION(S) / ED DIAGNOSES  Final diagnoses:  Nonspecific chest pain     ED Discharge Orders    None       Note:  This document was prepared using Dragon voice recognition software and may include unintentional dictation errors.    Arnaldo Natal, MD 07/08/20 [phone]

## 2020-07-08 NOTE — ED Triage Notes (Signed)
Patient c/o left arm and leg numbness that has been ongoing for months. Reports she started having left sided sharp chest pains yesterday and through today.

## 2020-07-19 ENCOUNTER — Telehealth (INDEPENDENT_AMBULATORY_CARE_PROVIDER_SITE_OTHER): Payer: PPO | Admitting: Gastroenterology

## 2020-07-19 ENCOUNTER — Encounter: Payer: Self-pay | Admitting: Gastroenterology

## 2020-07-19 DIAGNOSIS — K5909 Other constipation: Secondary | ICD-10-CM | POA: Diagnosis not present

## 2020-07-19 DIAGNOSIS — K222 Esophageal obstruction: Secondary | ICD-10-CM | POA: Diagnosis not present

## 2020-07-19 DIAGNOSIS — R1310 Dysphagia, unspecified: Secondary | ICD-10-CM

## 2020-07-19 DIAGNOSIS — R1319 Other dysphagia: Secondary | ICD-10-CM

## 2020-07-19 NOTE — Progress Notes (Signed)
[name] , MD [address]  [address]  [place], [place] [address]  Main: [phone]  Fax: [phone]   Primary Care Physician: [name], MD  Virtual Visit via Telephone Note  I connected with patient on 07/19/20 at 10:15 AM EDT by telephone and verified that I am speaking with the correct person using two identifiers.   I discussed the limitations, risks, security and privacy concerns of performing an evaluation and management service by telephone and the availability of in person appointments. I also discussed with the patient that there may be a patient responsible charge related to this service. The patient expressed understanding and agreed to proceed.  Location of Patient: Home Location of Provider: Home Persons involved: Patient and provider only   History of Present Illness: Chief Complaint  Patient presents with  . Dysphagia    HPI: Sheila Long is a 86 y.o. female   Summary of history :  Initially referred and seen on 06/26/2020 for dysphagia and food impaction.She presented to the emergency room on 04/03/2020 with a food impaction.  She has had issues with swallowing for years but never ended up in the hospital for the same reason.  After her dinner could not swallow and felt like food was stuck in her esophagus and subsequently I performed an endoscopy that day and noticed a large piece of meat impacted at the lower end of her esophagus which I took out.  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  LA grade B esophagitis was noted.  I did not dilate the stricture on that day due to the esophagitis.  She has been on Prilosec.  Also has issues of constipation and is on MiraLAX and Metamucil.   Interval history   06/26/2020-07/19/2020  At her last visit commenced on Linzess it said it caused her severe diarrhea she took it at night.  Since then she has not had any issues with constipation.  And takes it as  a as needed basis.  In terms with her dysphagia it continues but since her last visit she has had issues with weakness and pain in her arms.  She states she is due to see a surgeon for it she also has chest pain and do to  see a cardiologist for it.  She is aware that she needs an endoscopy but due to multiple appointments she wishes to hold off and will contact my office when she is ready.   Current Outpatient Medications  Medication Sig Dispense Refill  . aspirin EC 81 MG tablet Take 81 mg by mouth every other day.    . budesonide-formoterol (SYMBICORT) 160-4.5 MCG/ACT inhaler SMARTSIG:2 Inhalation Via Inhaler Twice Daily    . carbidopa-levodopa (SINEMET IR) 25-100 MG tablet Take 1 tablet by mouth 2 (two) times daily.    . cyanocobalamin (,VITAMIN B-12,) 1000 MCG/ML injection Inject into the muscle.    . ergocalciferol (VITAMIN D2) 1.25 MG (50000 UT) capsule Take by mouth.    . fluticasone furoate-vilanterol (BREO ELLIPTA) 200-25 MCG/INH AEPB Inhale 1 puff into the lungs daily. Rinse mouth after use. 1 each 5  . latanoprost (XALATAN) 0.005 % ophthalmic solution Place 1 drop into both eyes at bedtime.  5  . meloxicam (MOBIC) 7.5 MG tablet Take 1 tablet by mouth daily.    . montelukast (SINGULAIR) 10 MG tablet Take by mouth.    . olmesartan (BENICAR) 40 MG tablet Take 1 tablet  by mouth daily.    Marland Kitchen olmesartan-hydrochlorothiazide (BENICAR HCT) 40-12.5 MG tablet Take 1 tablet by mouth daily.    . Acetaminophen-Codeine 300-30 MG tablet Take 1 tablet by mouth as needed. (Patient not taking: Reported on 07/19/2020)    . ALPRAZolam (XANAX) 0.25 MG tablet Take by mouth. (Patient not taking: Reported on 07/19/2020)    . azelastine (ASTELIN) 0.1 % nasal spray Place 1 spray into both nostrils daily. (Patient not taking: Reported on 07/19/2020)    . busPIRone (BUSPAR) 5 MG tablet  (Patient not taking: Reported on 07/19/2020)    . doxazosin (CARDURA) 4 MG tablet Take 4 mg by mouth daily. (Patient not taking:  Reported on 07/19/2020)    . furosemide (LASIX) 20 MG tablet Take 1 tablet (20 mg total) by mouth daily. (Patient not taking: Reported on 07/19/2020) 30 tablet 0  . hydrochlorothiazide (HYDRODIURIL) 50 MG tablet  (Patient not taking: Reported on 07/19/2020)    . LORazepam (ATIVAN) 0.5 MG tablet Take 0.5 mg by mouth as needed. (Patient not taking: Reported on 07/19/2020)    . methocarbamol (ROBAXIN-750) 750 MG tablet Robaxin-750  750 mg tablet  Take 1 tablet every day by oral route at bedtime. (Patient not taking: Reported on 07/19/2020)    . MUCINEX 600 MG 12 hr tablet Take 1 tablet by mouth 2 (two) times daily. (Patient not taking: Reported on 07/19/2020)    . predniSONE (DELTASONE) 20 MG tablet prednisone 20 mg tablet  TAKE 1 TABLET BY MOUTH EVERY DAY (Patient not taking: Reported on 07/19/2020)     No current facility-administered medications for this visit.    Allergies as of 07/19/2020 - Review Complete 07/19/2020  Allergen Reaction Noted  . Tramadol Other (See Comments) 04/24/2016  . Sulfa antibiotics  10/27/2013  . Zocor [simvastatin] Other (See Comments) 04/04/2015    Review of Systems:    All systems reviewed and negative except where noted in HPI.   Observations/Objective:  Labs: CMP     Component Value Date/Time   NA 139 07/08/2020 1143   NA 141 03/23/2015 0842   K 3.8 07/08/2020 1143   K 4.0 03/23/2015 0842   CL 104 07/08/2020 1143   CL 107 03/23/2015 0842   CO2 27 07/08/2020 1143   CO2 26 03/23/2015 0842   GLUCOSE 160 (H) 07/08/2020 1143   GLUCOSE 126 (H) 03/23/2015 0842   BUN 24 (H) 07/08/2020 1143   BUN 17 03/23/2015 0842   CREATININE 1.27 (H) 07/08/2020 1143   CREATININE 1.05 (H) 03/23/2015 0842   CALCIUM 8.8 (L) 07/08/2020 1143   CALCIUM 9.2 03/23/2015 0842   PROT 7.6 04/03/2020 1422   PROT 7.1 03/23/2015 0842   ALBUMIN 4.4 04/03/2020 1422   ALBUMIN 4.2 03/23/2015 0842   AST 22 04/03/2020 1422   AST 26 03/23/2015 0842   ALT 8 04/03/2020 1422   ALT 18  03/23/2015 0842   ALKPHOS 69 04/03/2020 1422   ALKPHOS 78 03/23/2015 0842   BILITOT 1.8 (H) 04/03/2020 1422   BILITOT 1.1 03/23/2015 0842   GFRNONAA 36 (L) 07/08/2020 1143   GFRNONAA 47 (L) 03/23/2015 0842   GFRAA 42 (L) 07/08/2020 1143   GFRAA 55 (L) 03/23/2015 0842   Lab Results  Component Value Date   WBC 6.0 07/08/2020   HGB 12.2 07/08/2020   HCT 36.1 07/08/2020   MCV 99.2 07/08/2020   PLT 188 07/08/2020    Imaging Studies: DG Chest 2 View  Result Date: 07/08/2020 CLINICAL DATA:  Left arm and  left leg numbness. EXAM: CHEST - 2 VIEW COMPARISON:  February 18, 2019 FINDINGS: Calcified atherosclerosis in the tortuous aorta, stable. The hila, mediastinum, and heart are otherwise normal. No pneumothorax. No nodules or masses. No focal infiltrates. No acute abnormalities are identified. IMPRESSION: No active cardiopulmonary disease. Electronically Signed   By: Gerome Sam III M.D   On: 07/08/2020 12:28    Assessment and Plan:   Sheila Long is a 86 y.o. y/o female who recently had an episode of food impaction and longstanding history of dysphagia is here for follow-up.  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  At her last visit commenced her on Linzess 72 mcg which caused her to have very significant bowel movements.  Hence she stopped it and takes it as an as-needed basis which seems to work for her.  In terms of her dysphagia the issue persists.  She is unable to schedule her endoscopy at this point of time as she says she has multiple physician visits including a cardiology visit.  She says she will call me once she has seen her cardiologist and she feels ready to perform her endoscopy and dilation.  I suggested her to take her Linzess 72 mcg after her breakfast in the mornings rather than at night and see how it works for her.  Continue PPI.   In the  meanwhile I suggest she follow-up with Dr. Nemiah Commander.    I discussed the assessment and treatment plan with the patient. The patient was provided an opportunity to ask questions and all were answered. The patient agreed with the plan and demonstrated an understanding of the instructions.   The patient was advised to call back or seek an in-person evaluation if the symptoms worsen or if the condition fails to improve as anticipated.  I provided 13 minutes of non-face-to-face time during this encounter.  Dr [name] MD,MRCP [facility]) Gastroenterology/Hepatology Pager: [phone]   Speech recognition software was used to dictate this note.

## 2020-08-22 DIAGNOSIS — G6289 Other specified polyneuropathies: Secondary | ICD-10-CM | POA: Diagnosis not present

## 2020-08-22 DIAGNOSIS — J811 Chronic pulmonary edema: Secondary | ICD-10-CM | POA: Diagnosis not present

## 2020-08-22 DIAGNOSIS — D869 Sarcoidosis, unspecified: Secondary | ICD-10-CM | POA: Diagnosis not present

## 2020-08-22 DIAGNOSIS — K449 Diaphragmatic hernia without obstruction or gangrene: Secondary | ICD-10-CM | POA: Diagnosis not present

## 2020-08-22 DIAGNOSIS — I10 Essential (primary) hypertension: Secondary | ICD-10-CM | POA: Diagnosis not present

## 2020-08-22 DIAGNOSIS — D860 Sarcoidosis of lung: Secondary | ICD-10-CM | POA: Diagnosis not present

## 2020-08-22 DIAGNOSIS — N1832 Chronic kidney disease, stage 3b: Secondary | ICD-10-CM | POA: Diagnosis not present

## 2020-08-22 DIAGNOSIS — R42 Dizziness and giddiness: Secondary | ICD-10-CM | POA: Diagnosis not present

## 2020-08-22 DIAGNOSIS — E538 Deficiency of other specified B group vitamins: Secondary | ICD-10-CM | POA: Diagnosis not present

## 2020-08-22 DIAGNOSIS — J42 Unspecified chronic bronchitis: Secondary | ICD-10-CM | POA: Diagnosis not present

## 2020-09-26 DIAGNOSIS — M9903 Segmental and somatic dysfunction of lumbar region: Secondary | ICD-10-CM | POA: Diagnosis not present

## 2020-09-26 DIAGNOSIS — M5412 Radiculopathy, cervical region: Secondary | ICD-10-CM | POA: Diagnosis not present

## 2020-09-26 DIAGNOSIS — M5033 Other cervical disc degeneration, cervicothoracic region: Secondary | ICD-10-CM | POA: Diagnosis not present

## 2020-09-26 DIAGNOSIS — M9901 Segmental and somatic dysfunction of cervical region: Secondary | ICD-10-CM | POA: Diagnosis not present

## 2020-09-27 DIAGNOSIS — M9903 Segmental and somatic dysfunction of lumbar region: Secondary | ICD-10-CM | POA: Diagnosis not present

## 2020-09-27 DIAGNOSIS — M5033 Other cervical disc degeneration, cervicothoracic region: Secondary | ICD-10-CM | POA: Diagnosis not present

## 2020-09-27 DIAGNOSIS — M5412 Radiculopathy, cervical region: Secondary | ICD-10-CM | POA: Diagnosis not present

## 2020-09-27 DIAGNOSIS — M9901 Segmental and somatic dysfunction of cervical region: Secondary | ICD-10-CM | POA: Diagnosis not present

## 2020-09-28 DIAGNOSIS — M5412 Radiculopathy, cervical region: Secondary | ICD-10-CM | POA: Diagnosis not present

## 2020-09-28 DIAGNOSIS — M9901 Segmental and somatic dysfunction of cervical region: Secondary | ICD-10-CM | POA: Diagnosis not present

## 2020-09-28 DIAGNOSIS — M5033 Other cervical disc degeneration, cervicothoracic region: Secondary | ICD-10-CM | POA: Diagnosis not present

## 2020-09-28 DIAGNOSIS — M9903 Segmental and somatic dysfunction of lumbar region: Secondary | ICD-10-CM | POA: Diagnosis not present

## 2020-10-02 DIAGNOSIS — M9903 Segmental and somatic dysfunction of lumbar region: Secondary | ICD-10-CM | POA: Diagnosis not present

## 2020-10-02 DIAGNOSIS — M9901 Segmental and somatic dysfunction of cervical region: Secondary | ICD-10-CM | POA: Diagnosis not present

## 2020-10-02 DIAGNOSIS — M5033 Other cervical disc degeneration, cervicothoracic region: Secondary | ICD-10-CM | POA: Diagnosis not present

## 2020-10-02 DIAGNOSIS — M5412 Radiculopathy, cervical region: Secondary | ICD-10-CM | POA: Diagnosis not present

## 2020-10-04 DIAGNOSIS — H401132 Primary open-angle glaucoma, bilateral, moderate stage: Secondary | ICD-10-CM | POA: Diagnosis not present

## 2020-10-26 DIAGNOSIS — H40153 Residual stage of open-angle glaucoma, bilateral: Secondary | ICD-10-CM | POA: Diagnosis not present

## 2020-11-07 DIAGNOSIS — R200 Anesthesia of skin: Secondary | ICD-10-CM | POA: Insufficient documentation

## 2020-11-07 DIAGNOSIS — M5489 Other dorsalgia: Secondary | ICD-10-CM | POA: Diagnosis not present

## 2020-11-17 ENCOUNTER — Other Ambulatory Visit: Payer: Self-pay

## 2020-11-17 ENCOUNTER — Emergency Department: Payer: PPO

## 2020-11-17 ENCOUNTER — Inpatient Hospital Stay
Admission: EM | Admit: 2020-11-17 | Discharge: 2020-11-20 | DRG: 280 | Disposition: A | Discharge status: Home Health | Payer: PPO | Attending: Internal Medicine | Admitting: Internal Medicine

## 2020-11-17 DIAGNOSIS — K5900 Constipation, unspecified: Secondary | ICD-10-CM

## 2020-11-17 DIAGNOSIS — I130 Hypertensive heart and chronic kidney disease with heart failure and stage 1 through stage 4 chronic kidney disease, or unspecified chronic kidney disease: Secondary | ICD-10-CM | POA: Diagnosis present

## 2020-11-17 DIAGNOSIS — E785 Hyperlipidemia, unspecified: Secondary | ICD-10-CM | POA: Diagnosis present

## 2020-11-17 DIAGNOSIS — Z7951 Long term (current) use of inhaled steroids: Secondary | ICD-10-CM

## 2020-11-17 DIAGNOSIS — D869 Sarcoidosis, unspecified: Secondary | ICD-10-CM | POA: Diagnosis not present

## 2020-11-17 DIAGNOSIS — F419 Anxiety disorder, unspecified: Secondary | ICD-10-CM | POA: Diagnosis present

## 2020-11-17 DIAGNOSIS — G629 Polyneuropathy, unspecified: Secondary | ICD-10-CM | POA: Diagnosis present

## 2020-11-17 DIAGNOSIS — N179 Acute kidney failure, unspecified: Secondary | ICD-10-CM | POA: Diagnosis not present

## 2020-11-17 DIAGNOSIS — H9190 Unspecified hearing loss, unspecified ear: Secondary | ICD-10-CM | POA: Diagnosis not present

## 2020-11-17 DIAGNOSIS — R0600 Dyspnea, unspecified: Secondary | ICD-10-CM | POA: Diagnosis present

## 2020-11-17 DIAGNOSIS — Z96653 Presence of artificial knee joint, bilateral: Secondary | ICD-10-CM | POA: Diagnosis present

## 2020-11-17 DIAGNOSIS — R0789 Other chest pain: Secondary | ICD-10-CM | POA: Diagnosis not present

## 2020-11-17 DIAGNOSIS — I5033 Acute on chronic diastolic (congestive) heart failure: Secondary | ICD-10-CM | POA: Diagnosis not present

## 2020-11-17 DIAGNOSIS — N189 Chronic kidney disease, unspecified: Secondary | ICD-10-CM | POA: Diagnosis not present

## 2020-11-17 DIAGNOSIS — Z79899 Other long term (current) drug therapy: Secondary | ICD-10-CM

## 2020-11-17 DIAGNOSIS — I517 Cardiomegaly: Secondary | ICD-10-CM | POA: Diagnosis not present

## 2020-11-17 DIAGNOSIS — M170 Bilateral primary osteoarthritis of knee: Secondary | ICD-10-CM | POA: Diagnosis present

## 2020-11-17 DIAGNOSIS — Z20822 Contact with and (suspected) exposure to covid-19: Secondary | ICD-10-CM | POA: Diagnosis not present

## 2020-11-17 DIAGNOSIS — N1832 Chronic kidney disease, stage 3b: Secondary | ICD-10-CM | POA: Diagnosis not present

## 2020-11-17 DIAGNOSIS — M79604 Pain in right leg: Secondary | ICD-10-CM | POA: Diagnosis not present

## 2020-11-17 DIAGNOSIS — R778 Other specified abnormalities of plasma proteins: Secondary | ICD-10-CM | POA: Diagnosis not present

## 2020-11-17 DIAGNOSIS — I739 Peripheral vascular disease, unspecified: Secondary | ICD-10-CM | POA: Diagnosis not present

## 2020-11-17 DIAGNOSIS — M79605 Pain in left leg: Secondary | ICD-10-CM | POA: Diagnosis not present

## 2020-11-17 DIAGNOSIS — I700 Atherosclerosis of aorta: Secondary | ICD-10-CM | POA: Diagnosis not present

## 2020-11-17 DIAGNOSIS — R0982 Postnasal drip: Secondary | ICD-10-CM | POA: Diagnosis not present

## 2020-11-17 DIAGNOSIS — Z96642 Presence of left artificial hip joint: Secondary | ICD-10-CM | POA: Diagnosis present

## 2020-11-17 DIAGNOSIS — I340 Nonrheumatic mitral (valve) insufficiency: Secondary | ICD-10-CM | POA: Diagnosis not present

## 2020-11-17 DIAGNOSIS — R7989 Other specified abnormal findings of blood chemistry: Secondary | ICD-10-CM | POA: Diagnosis present

## 2020-11-17 DIAGNOSIS — Z791 Long term (current) use of non-steroidal anti-inflammatories (NSAID): Secondary | ICD-10-CM | POA: Diagnosis not present

## 2020-11-17 DIAGNOSIS — Z809 Family history of malignant neoplasm, unspecified: Secondary | ICD-10-CM

## 2020-11-17 DIAGNOSIS — Z7982 Long term (current) use of aspirin: Secondary | ICD-10-CM

## 2020-11-17 DIAGNOSIS — R911 Solitary pulmonary nodule: Secondary | ICD-10-CM | POA: Diagnosis not present

## 2020-11-17 DIAGNOSIS — M5090 Cervical disc disorder, unspecified, unspecified cervical region: Secondary | ICD-10-CM | POA: Diagnosis present

## 2020-11-17 DIAGNOSIS — R0602 Shortness of breath: Secondary | ICD-10-CM | POA: Diagnosis not present

## 2020-11-17 DIAGNOSIS — I10 Essential (primary) hypertension: Secondary | ICD-10-CM | POA: Diagnosis not present

## 2020-11-17 DIAGNOSIS — I5032 Chronic diastolic (congestive) heart failure: Secondary | ICD-10-CM

## 2020-11-17 DIAGNOSIS — R079 Chest pain, unspecified: Secondary | ICD-10-CM

## 2020-11-17 DIAGNOSIS — Z882 Allergy status to sulfonamides status: Secondary | ICD-10-CM

## 2020-11-17 DIAGNOSIS — I2510 Atherosclerotic heart disease of native coronary artery without angina pectoris: Secondary | ICD-10-CM | POA: Diagnosis present

## 2020-11-17 DIAGNOSIS — K219 Gastro-esophageal reflux disease without esophagitis: Secondary | ICD-10-CM | POA: Diagnosis present

## 2020-11-17 DIAGNOSIS — K449 Diaphragmatic hernia without obstruction or gangrene: Secondary | ICD-10-CM | POA: Diagnosis not present

## 2020-11-17 DIAGNOSIS — I6521 Occlusion and stenosis of right carotid artery: Secondary | ICD-10-CM

## 2020-11-17 DIAGNOSIS — G4730 Sleep apnea, unspecified: Secondary | ICD-10-CM | POA: Diagnosis not present

## 2020-11-17 DIAGNOSIS — Z9861 Coronary angioplasty status: Secondary | ICD-10-CM | POA: Diagnosis not present

## 2020-11-17 DIAGNOSIS — I214 Non-ST elevation (NSTEMI) myocardial infarction: Secondary | ICD-10-CM | POA: Diagnosis not present

## 2020-11-17 DIAGNOSIS — G4733 Obstructive sleep apnea (adult) (pediatric): Secondary | ICD-10-CM | POA: Diagnosis not present

## 2020-11-17 DIAGNOSIS — Z8249 Family history of ischemic heart disease and other diseases of the circulatory system: Secondary | ICD-10-CM

## 2020-11-17 DIAGNOSIS — N183 Chronic kidney disease, stage 3 unspecified: Secondary | ICD-10-CM | POA: Diagnosis not present

## 2020-11-17 DIAGNOSIS — Z888 Allergy status to other drugs, medicaments and biological substances status: Secondary | ICD-10-CM

## 2020-11-17 LAB — HEPARIN LEVEL (UNFRACTIONATED): Heparin Unfractionated: 0.5 IU/mL (ref 0.30–0.70)

## 2020-11-17 LAB — COMPREHENSIVE METABOLIC PANEL
ALT: 8 U/L (ref 0–44)
AST: 16 U/L (ref 15–41)
Albumin: 3.7 g/dL (ref 3.5–5.0)
Alkaline Phosphatase: 55 U/L (ref 38–126)
Anion gap: 8 (ref 5–15)
BUN: 21 mg/dL (ref 8–23)
CO2: 26 mmol/L (ref 22–32)
Calcium: 8.7 mg/dL — ABNORMAL LOW (ref 8.9–10.3)
Chloride: 103 mmol/L (ref 98–111)
Creatinine, Ser: 1.4 mg/dL — ABNORMAL HIGH (ref 0.44–1.00)
GFR, Estimated: 35 mL/min — ABNORMAL LOW (ref >60)
Glucose, Bld: 118 mg/dL — ABNORMAL HIGH (ref 70–99)
Potassium: 3.9 mmol/L (ref 3.5–5.1)
Sodium: 137 mmol/L (ref 135–145)
Total Bilirubin: 1.1 mg/dL (ref 0.3–1.2)
Total Protein: 6.7 g/dL (ref 6.5–8.1)

## 2020-11-17 LAB — CBC WITH DIFFERENTIAL/PLATELET
Abs Immature Granulocytes: 0.03 10*3/uL (ref 0.00–0.07)
Basophils Absolute: 0 10*3/uL (ref 0.0–0.1)
Basophils Relative: 0 %
Eosinophils Absolute: 0.2 10*3/uL (ref 0.0–0.5)
Eosinophils Relative: 3 %
HCT: 36.1 % (ref 36.0–46.0)
Hemoglobin: 12.4 g/dL (ref 12.0–15.0)
Immature Granulocytes: 0 %
Lymphocytes Relative: 24 %
Lymphs Abs: 1.7 10*3/uL (ref 0.7–4.0)
MCH: 34.1 pg — ABNORMAL HIGH (ref 26.0–34.0)
MCHC: 34.3 g/dL (ref 30.0–36.0)
MCV: 99.2 fL (ref 80.0–100.0)
Monocytes Absolute: 0.6 10*3/uL (ref 0.1–1.0)
Monocytes Relative: 8 %
Neutro Abs: 4.6 10*3/uL (ref 1.7–7.7)
Neutrophils Relative %: 65 %
Platelets: 177 10*3/uL (ref 150–400)
RBC: 3.64 MIL/uL — ABNORMAL LOW (ref 3.87–5.11)
RDW: 13.2 % (ref 11.5–15.5)
WBC: 7.2 10*3/uL (ref 4.0–10.5)
nRBC: 0 % (ref 0.0–0.2)

## 2020-11-17 LAB — PROTIME-INR
INR: 0.9 (ref 0.8–1.2)
Prothrombin Time: 12.2 seconds (ref 11.4–15.2)

## 2020-11-17 LAB — TROPONIN I (HIGH SENSITIVITY)
Troponin I (High Sensitivity): 317 ng/L (ref <18)
Troponin I (High Sensitivity): 899 ng/L (ref <18)
Troponin I (High Sensitivity): 1742 ng/L (ref <18)

## 2020-11-17 LAB — RESP PANEL BY RT-PCR (FLU A&B, COVID) ARPGX2
Influenza A by PCR: NEGATIVE
Influenza B by PCR: NEGATIVE
SARS Coronavirus 2 by RT PCR: NEGATIVE

## 2020-11-17 LAB — FIBRIN DERIVATIVES D-DIMER (ARMC ONLY): Fibrin derivatives D-dimer (ARMC): 1116.97 ng/mL (FEU) — ABNORMAL HIGH (ref 0.00–499.00)

## 2020-11-17 LAB — APTT: aPTT: 28 seconds (ref 24–36)

## 2020-11-17 LAB — BRAIN NATRIURETIC PEPTIDE: B Natriuretic Peptide: 133 pg/mL — ABNORMAL HIGH (ref 0.0–100.0)

## 2020-11-17 LAB — TSH: TSH: 1.674 u[IU]/mL (ref 0.350–4.500)

## 2020-11-17 MED ORDER — OLMESARTAN MEDOXOMIL-HCTZ 40-12.5 MG PO TABS: 1.0000 | ORAL_TABLET | Freq: Every day | ORAL | Status: DC

## 2020-11-17 MED ORDER — ACETAMINOPHEN 650 MG RE SUPP: 650.0000 mg | Freq: Four times a day (QID) | RECTAL | Status: DC | PRN

## 2020-11-17 MED ORDER — CARBIDOPA-LEVODOPA 25-100 MG PO TABS
1.0000 | ORAL_TABLET | Freq: Every day | ORAL | Status: DC
  Administered 2020-11-17 – 2020-11-19 (×3): 1 via ORAL
  Filled 2020-11-17 (×4): qty 1

## 2020-11-17 MED ORDER — METOPROLOL SUCCINATE ER 100 MG PO TB24
100.0000 mg | ORAL_TABLET | Freq: Every day | ORAL | Status: DC
  Administered 2020-11-18 – 2020-11-20 (×3): 100 mg via ORAL
  Filled 2020-11-17 (×3): qty 1

## 2020-11-17 MED ORDER — SODIUM CHLORIDE 0.9 % IV SOLN: Freq: Once | INTRAVENOUS | Status: AC

## 2020-11-17 MED ORDER — ONDANSETRON HCL 4 MG/2ML IJ SOLN: 4.0000 mg | Freq: Four times a day (QID) | INTRAMUSCULAR | Status: DC | PRN

## 2020-11-17 MED ORDER — NITROGLYCERIN 2 % TD OINT
0.5000 [in_us] | TOPICAL_OINTMENT | Freq: Once | TRANSDERMAL | Status: AC
  Administered 2020-11-17: 0.5 [in_us] via TOPICAL
  Filled 2020-11-17: qty 1

## 2020-11-17 MED ORDER — HYDROCHLOROTHIAZIDE 12.5 MG PO CAPS
12.5000 mg | ORAL_CAPSULE | Freq: Every day | ORAL | Status: DC
  Administered 2020-11-18: 12.5 mg via ORAL
  Filled 2020-11-17: qty 1

## 2020-11-17 MED ORDER — ACETAMINOPHEN 325 MG PO TABS: 650.0000 mg | ORAL_TABLET | Freq: Four times a day (QID) | ORAL | Status: DC | PRN

## 2020-11-17 MED ORDER — IRBESARTAN 150 MG PO TABS
300.0000 mg | ORAL_TABLET | Freq: Every day | ORAL | Status: DC
  Administered 2020-11-18 – 2020-11-19 (×2): 300 mg via ORAL
  Filled 2020-11-17 (×3): qty 2

## 2020-11-17 MED ORDER — ONDANSETRON HCL 4 MG PO TABS: 4.0000 mg | ORAL_TABLET | Freq: Four times a day (QID) | ORAL | Status: DC | PRN

## 2020-11-17 MED ORDER — HEPARIN SODIUM (PORCINE) 5000 UNIT/ML IJ SOLN
4000.0000 [IU] | Freq: Once | INTRAMUSCULAR | Status: AC
  Administered 2020-11-17: 4000 [IU] via INTRAVENOUS
  Filled 2020-11-17: qty 1

## 2020-11-17 MED ORDER — IOHEXOL 350 MG/ML SOLN
60.0000 mL | Freq: Once | INTRAVENOUS | Status: AC | PRN
  Administered 2020-11-17: 60 mL via INTRAVENOUS

## 2020-11-17 MED ORDER — HEPARIN (PORCINE) 25000 UT/250ML-% IV SOLN
1000.0000 [IU]/h | INTRAVENOUS | Status: DC
  Administered 2020-11-17 – 2020-11-18 (×2): 1000 [IU]/h via INTRAVENOUS
  Filled 2020-11-17 (×2): qty 250

## 2020-11-17 MED ORDER — ASPIRIN 81 MG PO CHEW
324.0000 mg | CHEWABLE_TABLET | Freq: Once | ORAL | Status: AC
  Administered 2020-11-17: 324 mg via ORAL
  Filled 2020-11-17: qty 4

## 2020-11-17 MED ORDER — HEPARIN (PORCINE) 25000 UT/250ML-% IV SOLN: 10.0000 [IU]/kg/h | INTRAVENOUS | Status: DC

## 2020-11-17 NOTE — ED Provider Notes (Signed)
[facility] Emergency Department Provider Note   ____________________________________________   First MD Initiated Contact with Patient 11/17/20 1303     (approximate)  I have reviewed the triage vital signs and the nursing notes.   HISTORY  Chief Complaint Leg Swelling  Chief complaint is chest pain  HPI Sheila Long is a 86 y.o. female reports she developed some chest pain yesterday.  The pain comes last briefly and resolves.  She drank some ginger ale today and burped and after that she has not had any more chest pain.  Chest pain did come with shortness of breath that was also intermittent yesterday.  She still having some intermittent chest pain today.  She also complains of bilateral leg pain and stiffness with walking has been going on for several days.  She said the neurologist gave her some gabapentin for that and some tingling in her wrist that she has as well as a wrist splint.  The pain in her lower legs may be worse in the last couple days.  Patient reports she has a cough that is productive of some clear mucus as well.         Past Medical History:  Diagnosis Date  . Allergic rhinitis due to pollen   . Anxiety   . Aortic atherosclerosis (HCC)   . CHF (congestive heart failure) (HCC)   . Coronary artery disease    Stent in 2000's (DC)  . Dyspnea   . GERD (gastroesophageal reflux disease)   . Hard of hearing   . Hypertension   . Neuropathy   . Obstructive sleep apnea   . Osteoarthritis of both knees   . Peripheral vascular disease (HCC)   . Sarcoidosis   . Thyroid nodule     Patient Active Problem List   Diagnosis Date Noted  . Numbness 10/05/2019  . Axillary abscess 11/12/2018  . Status post total replacement of both hips 10/04/2018  . Prediabetes 10/30/2017  . Chronic diastolic heart failure (HCC) 10/29/2017  . Anxiety 10/29/2017  . Fatigue 07/29/2017  . Mood disorder (HCC) 07/29/2017  . Drug-induced constipation  06/26/2017  . S/P total knee arthroplasty 06/23/2017  . Pulmonary hypertension associated with sarcoidosis (HCC) 02/03/2017  . Lower extremity edema 11/28/2016  . Familial tremor 09/24/2016  . Sarcoidosis   . Hypertension   . Allergic rhinitis due to pollen   . Coronary artery disease   . Aortic atherosclerosis (HCC)   . GERD (gastroesophageal reflux disease)   . Peripheral vascular disease (HCC)   . Osteoarthritis of both knees   . Obstructive sleep apnea   . Neuropathy   . Dyslipidemia 11/19/2013  . Thyroid nodule 11/19/2013    Past Surgical History:  Procedure Laterality Date  . ABDOMINAL HYSTERECTOMY    . BIOPSY THYROID  2014   Dr Andee Poles  . BREAST EXCISIONAL BIOPSY Left 1980s   surgical bx neg  . BREAST EXCISIONAL BIOPSY Left 1980s   benign  . CARDIOVASCULAR STRESS TEST  05/12/2012   with dobutamine---negative. Normal LV function  . CORONARY ANGIOPLASTY  2006  . ESOPHAGOGASTRODUODENOSCOPY (EGD) WITH PROPOFOL N/A 04/03/2020   Procedure: ESOPHAGOGASTRODUODENOSCOPY (EGD) WITH PROPOFOL;  Surgeon: [name], MD;  Location: [facility] ENDOSCOPY;  Service: Gastroenterology;  Laterality: N/A;  . JOINT REPLACEMENT Left 2000   hip  . JOINT REPLACEMENT Right 2007   hip  . KNEE ARTHROPLASTY Right 06/23/2017   Procedure: COMPUTER ASSISTED TOTAL KNEE ARTHROPLASTY;  Surgeon: Donato Heinz, MD;  Location: [facility] ORS;  Service: Orthopedics;  Laterality: Right;    Prior to Admission medications   Medication Sig Start Date End Date Taking? Authorizing Provider  Acetaminophen-Codeine 300-30 MG tablet Take 1 tablet by mouth as needed. Patient not taking: Reported on 07/19/2020    [provider]  ALPRAZolam Prudy Feeler(XANAX) 0.25 MG tablet Take by mouth. Patient not taking: Reported on 07/19/2020    [provider]  aspirin EC 81 MG tablet Take 81 mg by mouth every other day.    [provider]  azelastine (ASTELIN) 0.1 % nasal spray Place 1 spray into both nostrils  daily. Patient not taking: Reported on 07/19/2020    [provider]  budesonide-formoterol [facility](SYMBICORT) 160-4.5 MCG/ACT inhaler SMARTSIG:2 Inhalation Via Inhaler Twice Daily 01/12/20   [provider]  busPIRone (BUSPAR) 5 MG tablet  04/27/20   [provider]  carbidopa-levodopa (SINEMET IR) 25-100 MG tablet Take 1 tablet by mouth 2 (two) times daily. 07/04/20   [provider]  cyanocobalamin (,VITAMIN B-12,) 1000 MCG/ML injection Inject into the muscle. 05/23/20   [provider]  doxazosin (CARDURA) 4 MG tablet Take 4 mg by mouth daily. Patient not taking: Reported on 07/19/2020 03/03/20   [provider]  fluticasone furoate-vilanterol (BREO ELLIPTA) 200-25 MCG/INH AEPB Inhale 1 puff into the lungs daily. Rinse mouth after use. 04/30/19   Shane Crutchamachandran, Pradeep, MD  furosemide (LASIX) 20 MG tablet Take 1 tablet (20 mg total) by mouth daily. Patient not taking: Reported on 07/19/2020 10/21/17 10/21/18  Merrily Brittleifenbark, Neil, MD  hydrochlorothiazide (HYDRODIURIL) 50 MG tablet  04/11/20   [provider]  latanoprost (XALATAN) 0.005 % ophthalmic solution Place 1 drop into both eyes at bedtime. 05/30/17   [provider]  LORazepam (ATIVAN) 0.5 MG tablet Take 0.5 mg by mouth as needed. Patient not taking: Reported on 07/19/2020 09/07/19   [provider]  meloxicam (MOBIC) 7.5 MG tablet Take 1 tablet by mouth daily. 07/04/20 07/04/21  [provider]  methocarbamol (ROBAXIN-750) 750 MG tablet Robaxin-750  750 mg tablet  Take 1 tablet every day by oral route at bedtime. Patient not taking: Reported on 07/19/2020    [provider]  montelukast (SINGULAIR) 10 MG tablet Take by mouth. 04/27/20 04/27/21  [provider]  MUCINEX 600 MG 12 hr tablet Take 1 tablet by mouth 2 (two) times daily. Patient not taking: Reported on 07/19/2020 11/18/19   [provider]  olmesartan (BENICAR) 40 MG tablet Take 1 tablet by  mouth daily.    [provider]  olmesartan-hydrochlorothiazide (BENICAR HCT) 40-12.5 MG tablet Take 1 tablet by mouth daily. 06/23/20   [provider]  predniSONE (DELTASONE) 20 MG tablet prednisone 20 mg tablet  TAKE 1 TABLET BY MOUTH EVERY DAY Patient not taking: Reported on 07/19/2020    [provider]    Allergies Tramadol, Sulfa antibiotics, and Zocor [simvastatin]  Family History  Problem Relation Age of Onset  . Cancer Sister [id number]       unknown - groin area  . Heart attack Brother   . Cancer Sister [id number]       pancreatic   . Heart attack Sister   . Heart attack Brother     Social History Social History   Tobacco Use  . Smoking status: Never Smoker  . Smokeless tobacco: Never Used  Vaping Use  . Vaping Use: Never used  Substance Use Topics  . Alcohol use: No  . Drug use: No    Review of Systems  Constitutional: No fever/chills Eyes: No visual changes. ENT: No sore throat. Cardiovascular:chest pain. Respiratory: shortness of breath. Gastrointestinal: No abdominal pain.  No nausea, no vomiting.  No diarrhea.  No constipation. Genitourinary: Negative for dysuria. Musculoskeletal: Negative for back pain. Skin: Negative for rash. Neurological: Negative for headaches, focal weakness   ____________________________________________   PHYSICAL EXAM:  VITAL SIGNS: ED Triage Vitals  Enc Vitals Group     BP      Pulse      Resp      Temp      Temp src      SpO2      Weight      Height      Head Circumference      Peak Flow      Pain Score      Pain Loc      Pain Edu?      Excl. in GC?     Constitutional: Alert and oriented. Well appearing and in no acute distress. Eyes: Conjunctivae are normal.  Head: Atraumatic. Nose: No congestion/rhinnorhea. Mouth/Throat: Mucous membranes are moist.  Oropharynx non-erythematous. Neck: No stridor. Cardiovascular: Normal rate, regular rhythm. Grossly normal heart sounds.  Good peripheral  circulation. Respiratory: Normal respiratory effort.  No retractions. Lungs CTAB. Gastrointestinal: Soft and nontender. No distention. No abdominal bruits. No CVA tenderness. Musculoskeletal: No lower extremity tenderness slight trace bilateral edema.  No joint effusions.  Patient does not have any plantar tenderness good distal pulses in feet Neurologic:  Normal speech and language. No new gross focal neurologic deficits are appreciated.  Skin:  Skin is warm, dry and intact. No rash noted.   ____________________________________________   LABS (all labs ordered are listed, but only abnormal results are displayed)  Labs Reviewed  COMPREHENSIVE METABOLIC PANEL - Abnormal; Notable for the following components:      Result Value   Glucose, Bld 118 (*)    Creatinine, Ser 1.40 (*)    Calcium 8.7 (*)    GFR, Estimated 35 (*)    All other components within normal limits  BRAIN NATRIURETIC PEPTIDE - Abnormal; Notable for the following components:   B Natriuretic Peptide 133.0 (*)    All other components within normal limits  CBC WITH DIFFERENTIAL/PLATELET - Abnormal; Notable for the following components:   RBC 3.64 (*)    MCH 34.1 (*)    All other components within normal limits  FIBRIN DERIVATIVES D-DIMER ([facility] ONLY) - Abnormal; Notable for the following components:   Fibrin derivatives D-dimer ([facility]) 1,116.97 (*)    All other components within normal limits  TROPONIN I (HIGH SENSITIVITY) - Abnormal; Notable for the following components:   Troponin I (High Sensitivity) 317 (*)    All other components within normal limits  RESP PANEL BY RT-PCR (FLU A&B, COVID) ARPGX2  PROTIME-INR  APTT  TROPONIN I (HIGH SENSITIVITY)   ____________________________________________  EKG  EKG read interpreted by me shows normal sinus rhythm rate of 68 normal axis no acute ST-T wave changes ____________________________________________  RADIOLOGY Jill Poling, personally viewed and evaluated  these images (plain radiographs) as part of my medical decision making, as well as reviewing the written report by the radiologist.  ED MD interpretation:   Official radiology report(s): DG Chest 1 View  Result Date: 11/17/2020 CLINICAL DATA:  [name].  Chest pain. EXAM: CHEST  1 VIEW COMPARISON:  One-view chest x-ray 07/08/2020 FINDINGS: The heart size is normal. Atherosclerotic changes are noted in the aorta. Lung  volumes are low. No focal airspace disease is present. Degenerative changes are noted at the shoulders, right greater than left. IMPRESSION: 1. Low lung volumes. 2. No acute cardiopulmonary disease. Electronically Signed   By: Marin Roberts M.D.   On: 11/17/2020 14:05   CT Angio Chest PE W and/or Wo Contrast  Result Date: 11/17/2020 CLINICAL DATA:  86 year old female with history of positive D-dimer, leg swelling. EXAM: CT ANGIOGRAPHY CHEST WITH CONTRAST TECHNIQUE: Multidetector CT imaging of the chest was performed using the standard protocol during bolus administration of intravenous contrast. Multiplanar CT image reconstructions and MIPs were obtained to evaluate the vascular anatomy. CONTRAST:  Sixty mL Omnipaque 350, intravenous COMPARISON:  10/21/2017 FINDINGS: Cardiovascular: Satisfactory opacification of the pulmonary arteries to the segmental level. No evidence of pulmonary embolism. Normal caliber main pulmonary artery measuring up to 30 mm, unchanged. The RV to LV ratio is normal. Mild global cardiomegaly. No pericardial effusion. Unchanged mild ectasia of the ascending thoracic aorta. Scattered atherosclerotic calcifications of the descending thoracic aorta. Mild coronary atherosclerotic calcifications. Mediastinum/Nodes: No enlarged mediastinal, hilar, or axillary lymph nodes. Thyroid gland, trachea, and esophagus demonstrate no significant findings. Lungs/Pleura: Unchanged polygonal solid pulmonary nodule in the right upper lobe measuring up to 4 mm, favored  represent a lymph node. No new worrisome pulmonary nodules. No focal consolidations, pleural effusion, or pneumothorax. Upper Abdomen: Moderate hiatal hernia similar to 2018 comparison without evidence of strangulation. The remaining visualized upper abdomen is within normal limits. Musculoskeletal: No chest wall abnormality. No acute or significant osseous findings. Review of the MIP images confirms the above findings. IMPRESSION: Vascular: 1. No evidence of acute pulmonary embolism to the subsegmental level. 2.  Aortic Atherosclerosis (ICD10-I70.0). Non-Vascular: 1. Moderate hiatal hernia, unchanged. 2. No acute intrathoracic abnormality. Marliss Coots, MD [facility] Electronically Signed   By: Marliss Coots MD   On: 11/17/2020 15:56   US Venous Img Lower Bilateral  Result Date: 11/17/2020 CLINICAL DATA:  Bilateral leg pain and tightness EXAM: BILATERAL LOWER EXTREMITY VENOUS DOPPLER ULTRASOUND TECHNIQUE: Gray-scale sonography with graded compression, as well as color Doppler and duplex ultrasound were performed to evaluate the lower extremity deep venous systems from the level of the common femoral vein and including the common femoral, femoral, profunda femoral, popliteal and calf veins including the posterior tibial, peroneal and gastrocnemius veins when visible. The superficial great saphenous vein was also interrogated. Spectral Doppler was utilized to evaluate flow at rest and with distal augmentation maneuvers in the common femoral, femoral and popliteal veins. COMPARISON:  None. FINDINGS: RIGHT LOWER EXTREMITY Common Femoral Vein: No evidence of thrombus. Normal compressibility, respiratory phasicity and response to augmentation. Saphenofemoral Junction: No evidence of thrombus. Normal compressibility and flow on color Doppler imaging. Profunda Femoral Vein: No evidence of thrombus. Normal compressibility and flow on color Doppler imaging.  Femoral Vein: No evidence of thrombus. Normal compressibility, respiratory phasicity and response to augmentation. Popliteal Vein: No evidence of thrombus. Normal compressibility, respiratory phasicity and response to augmentation. Calf Veins: No evidence of thrombus. Normal compressibility and flow on color Doppler imaging. LEFT LOWER EXTREMITY Common Femoral Vein: No evidence of thrombus. Normal compressibility, respiratory phasicity and response to augmentation. Saphenofemoral Junction: No evidence of thrombus. Normal compressibility and flow on color Doppler imaging. Profunda Femoral Vein: No evidence of thrombus. Normal compressibility and flow on color Doppler imaging. Femoral Vein: No evidence of thrombus. Normal compressibility, respiratory phasicity and response to augmentation. Popliteal Vein: No evidence of thrombus. Normal compressibility, respiratory  phasicity and response to augmentation. Calf Veins: No evidence of thrombus. Normal compressibility and flow on color Doppler imaging. IMPRESSION: No evidence of deep venous thrombosis in either lower extremity. Electronically Signed   By: Judie Petit.  Shick M.D.   On: 11/17/2020 14:57    ____________________________________________   PROCEDURES  Procedure(s) performed (including Critical Care):  Procedures   ____________________________________________   INITIAL IMPRESSION / ASSESSMENT AND PLAN / ED COURSE ----------------------------------------- 2:52 PM on 11/17/2020 -----------------------------------------  Patient's troponin which was 7 on her last visit is now 315.  Patient still having some chest tightness.  We will start some aspirin and heparin.  Additionally her D-dimer is 1115 in view of her shortness of breath which is intermittent I will go ahead and try to get a CT angio.  Her creatinine is borderline for this though.  ----------------------------------------- 4:05 PM on  11/17/2020 -----------------------------------------  Patient with chest tightness and some shortness of breath as mentioned above.  The CT angio was negative for PE.  D-dimer could be elevated if she has a blood clot in her coronary arteries causing some partial obstruction.  The patient is not having a STEMI on the EKG.  Patient's troponin is markedly more elevated than it had been.  She is having symptoms.  I will get her in the hospital for further evaluation of her chest pain with troponin elevation.  This could be an NSTEMI.  Her GFR is slightly worse than previously but her troponin is markedly more elevated.  I do not think that the troponin elevation is due solely to her GFR.             ____________________________________________   FINAL CLINICAL IMPRESSION(S) / ED DIAGNOSES  Final diagnoses:  Chest pain, unspecified type  Elevated troponin  [name]     ED Discharge Orders    None      *Please note:  Sheila Long was evaluated in Emergency Department on 11/17/2020 for the symptoms described in the history of present illness. She was evaluated in the context of the global COVID-19 pandemic, which necessitated consideration that the patient might be at risk for infection with the SARS-CoV-2 virus that causes COVID-19. Institutional protocols and algorithms that pertain to the evaluation of patients at risk for COVID-19 are in a state of rapid change based on information released by regulatory bodies including the CDC and federal and state organizations. These policies and algorithms were followed during the patient's care in the ED.  Some ED evaluations and interventions may be delayed as a result of limited staffing during and the pandemic.*   Note:  This document was prepared using Dragon voice recognition software and may include unintentional dictation errors.    Arnaldo Natal, MD 11/17/20 [id number]

## 2020-11-17 NOTE — ED Notes (Signed)
Report given to [facility] RN

## 2020-11-17 NOTE — Progress Notes (Signed)
ANTICOAGULATION CONSULT NOTE   Pharmacy Consult for heparin infusion Indication: chest pain/ACS/STEMI  Patient Measurements: Height: 5\' 4"  (162.6 cm) Weight: 95.3 kg (210 lb 1.6 oz) IBW/kg (Calculated) : 54.7 Heparin Dosing Weight: 76.5 kg  Vital Signs: Temp: 98.5 F (36.9 C) (11/19 1408) Temp Source: Oral (11/19 1408) BP: 159/67 (11/19 1408) Pulse Rate: 70 (11/19 1408)  Labs: Recent Labs    11/17/20 1343  HGB 12.4  HCT 36.1  PLT 177  CREATININE 1.40*  TROPONINIHS 317*    Estimated Creatinine Clearance: 27.5 mL/min (A) (by C-G formula based on SCr of 1.4 mg/dL (H)).   Medical History: Past Medical History:  Diagnosis Date  . Allergic rhinitis due to pollen   . Anxiety   . Aortic atherosclerosis (HCC)   . CHF (congestive heart failure) (HCC)   . Coronary artery disease    Stent in 2000's (DC)  . Dyspnea   . GERD (gastroesophageal reflux disease)   . Hard of hearing   . Hypertension   . Neuropathy   . Obstructive sleep apnea   . Osteoarthritis of both knees   . Peripheral vascular disease (HCC)   . Sarcoidosis   . Thyroid nodule      Assessment: 86 yo female with PMH of CHF, CAD, HTN presenting with leg swelling & numbness. Home med hx incomplete at this time.  Past med hx shows HTN meds, but no indication of anticoags.  1st HS troponin 317, Fibrin D-dimer 1117, H&H, Plts WNL.  INR and aPTT ordered and pending results.   Goal of Therapy:  Heparin level 0.3-0.7 units/ml Monitor platelets by anticoagulation protocol: Yes   Plan:  Give 4000 units bolus x 1  Start heparin infusion at 1000 units/hr Check anti-Xa level every 8 hours and then daily once therapeutic while on heparin Continue to monitor H&H and platelets.  97, PharmD, [facility] 11/17/2020 3:23 PM

## 2020-11-17 NOTE — ED Notes (Signed)
Date and time results received: 11/17/20 1440 Test: Troponin Critical Value: 317  Name of Provider Notified: Malinda  Orders Received? Or Actions Taken?: MD notified of critical troponin. MD notified

## 2020-11-17 NOTE — ED Triage Notes (Signed)
Patient states she has leg swelling, numbness and tingling. EMS did not give report to RN

## 2020-11-17 NOTE — Progress Notes (Signed)
Patient admitted to room 260. VSS, denies CP/SOB. Ambulatory x1 with cane. Oriented to nursing team.

## 2020-11-17 NOTE — ED Notes (Signed)
Contacted lab to add on other labs to blue top.

## 2020-11-17 NOTE — H&P (Signed)
History and Physical   Sheila Long ZOX:[id number] DOB: 1924-02-09 DOA: 11/17/2020  PCP: Corky DownsMasoud, Javed, MD  Outpatient Specialists: Dr. Silas FloodParascho, Cardiology Patient coming from: Home  I have personally briefly reviewed patient's [facility].  Chief Concern: [name]  HPI: Sheila LampKathelene Long is a 86 y.o. female with medical history significant for hypertension, history of CAD status post remote PCI, history of sarcoidosis, presented to the emergency department for chief concerns of shortness of breath and chest pain.  She reports that her symptoms have been worsening over the last year.  She further endorses thightness in her leg, right > Left leg, when she walks.  She reports medical compressions stocks help.  She states that her shortness of breath improves with rest and is worse with exertion.  She states chest discomfort is 6 out of 10, lasting seconds.  She endorses left hand numbness.  However, her left hand numbness has been going one for years and she wears a brace that helps a little. This is unchanged today.   Review of system is negative for headache, vision changes, nausea, vomiting, dysphagia, odynophagia, fever, chills, abdominal pain, dysuria, hematuria, diarrhea, blood in stool.  Social history: lives by herself. Formerly worked as a Engineer, civil (consulting)nurse. Denies tobacco use, etoh, and rerecreational drug  ED Course: Discussed with ED provider, admit for NSTEMI.  ED provider ordered bilateral lower extremity DVT and CTA to assess for PE.  Chest x-ray was ordered and was negative for x-ray evidence of acute cardiopulmonary etiology.  Aspirin 324 mg once, heparin GTT started.  Review of Systems: As per HPI otherwise 10 point review of systems negative.   Assessment/Plan  Principal Problem:   [name] Active Problems:   Sarcoidosis   Hypertension   Coronary artery disease   GERD (gastroesophageal reflux disease)   Peripheral  vascular disease (HCC)   Osteoarthritis of both knees   Chronic diastolic heart failure (HCC)   Anxiety   Dyslipidemia   [name] and chest pain-query ACS versus pulmonary -TSH was normal, negative for Covid -BNP mildly elevated at 133 -Cardiology has been consulted, Dr. Laruth BouchardKowaski is aware -CTA negative for PE, ultrasound of lower extremity was negative for DVT -Elevated troponin with positive delta -I discussed with patient the possibility of ischemia and the need for PCI and its risks.  Patient states that she would like to think about it overnight. -LR IVF in anticipation of possible cath -In the meantime, n.p.o. in anticipation of possible cardiology intervention pending cardiology evaluation and patient decision -Nitroglycerin sublingual as needed every 5 minutes for chest pain  Hypertension-resumed home irbesartan, hydrochlorothiazide, metoprolol succinate  DVT prophylaxis: Heparin GTT Code Status: Full code Diet: N.p.o.  Family Communication: Discussed with friend at bedside whom patient wants updated Disposition Plan: Pending clinical course including cardiology evaluation Consults called: Cardiology Admission status: Progressive cardiac observation  Past Medical History:  Diagnosis Date  . Allergic rhinitis due to pollen   . Anxiety   . Aortic atherosclerosis (HCC)   . CHF (congestive heart failure) (HCC)   . Coronary artery disease    Stent in 2000's (DC)  . Dyspnea   . GERD (gastroesophageal reflux disease)   . Hard of hearing   . Hypertension   . Neuropathy   . Obstructive sleep apnea   . Osteoarthritis of both knees   . Peripheral vascular disease (HCC)   . Sarcoidosis   . Thyroid nodule    Past Surgical History:  Procedure Laterality Date  . ABDOMINAL HYSTERECTOMY    . BIOPSY THYROID  2014   Dr Andee Poles  . BREAST EXCISIONAL BIOPSY Left 1980s   surgical bx neg  . BREAST EXCISIONAL BIOPSY Left 1980s   benign  . CARDIOVASCULAR STRESS TEST   05/12/2012   with dobutamine---negative. Normal LV function  . CORONARY ANGIOPLASTY  2006  . ESOPHAGOGASTRODUODENOSCOPY (EGD) WITH PROPOFOL N/A 04/03/2020   Procedure: ESOPHAGOGASTRODUODENOSCOPY (EGD) WITH PROPOFOL;  Surgeon: [name], MD;  Location: [facility] ENDOSCOPY;  Service: Gastroenterology;  Laterality: N/A;  . JOINT REPLACEMENT Left 2000   hip  . JOINT REPLACEMENT Right 2007   hip  . KNEE ARTHROPLASTY Right 06/23/2017   Procedure: COMPUTER ASSISTED TOTAL KNEE ARTHROPLASTY;  Surgeon: Donato Heinz, MD;  Location: [facility] ORS;  Service: Orthopedics;  Laterality: Right;   Social History:  reports that she has never smoked. She has never used smokeless tobacco. She reports that she does not drink alcohol and does not use drugs.  Allergies  Allergen Reactions  . Tramadol Other (See Comments)    CONFUSION  . Sulfa Antibiotics     Patient does not recall  . Zocor [Simvastatin] Other (See Comments)    INTOLERANCE-MYALGIAS    Family History  Problem Relation Age of Onset  . Cancer Sister 23       unknown - groin area  . Heart attack Brother   . Cancer Sister 33       pancreatic   . Heart attack Sister   . Heart attack Brother    Family history: Family history reviewed and brother and older sister had heart attacks.   Prior to Admission medications   Medication Sig Start Date End Date Taking? Authorizing Provider  carbidopa-levodopa (SINEMET IR) 25-100 MG tablet Take 1 tablet by mouth 2 (two) times daily. 07/04/20  Yes [provider]  gabapentin (NEURONTIN) 100 MG capsule Take 100 mg by mouth in the morning and at bedtime. 11/07/20  Yes [provider]  latanoprost (XALATAN) 0.005 % ophthalmic solution Place 1 drop into both eyes at bedtime. 05/30/17  Yes [provider]  metoprolol succinate (TOPROL-XL) 100 MG 24 hr tablet Take 100 mg by mouth daily. 10/21/20  Yes [provider]  olmesartan-hydrochlorothiazide (BENICAR HCT) 40-12.5 MG tablet Take 1  tablet by mouth daily. 06/23/20  Yes [provider]  Vitamin D, Ergocalciferol, (DRISDOL) 1.25 MG (50000 UNIT) CAPS capsule Take 50,000 Units by mouth once a week. 08/04/20  Yes [provider]  aspirin EC 81 MG tablet Take 81 mg by mouth every other day.    [provider]   Physical Exam: Vitals:   11/17/20 1615 11/17/20 1630 11/17/20 1829 11/17/20 2104  BP: (!) 153/83 (!) 162/82 (!) 152/79 139/71  Pulse: 67 70 66 69  Resp: (!) 26 15 18 17   Temp:   98.5 F (36.9 C) 98.3 F (36.8 C)  TempSrc:    Oral  SpO2: 96% 96% 100% 94%  Weight:   94.2 kg   Height:   5\' 4"  (1.626 m)    Constitutional: appears younger than chronological age NAD, calm, comfortable Eyes: PERRL, lids and conjunctivae normal ENMT: Mucous membranes are moist. Posterior pharynx clear of any exudate or lesions. Age-appropriate dentition. Hearing appropriate Neck: normal, supple, no masses, no thyromegaly Respiratory: clear to auscultation bilaterally, no wheezing, no crackles. Normal respiratory effort. No accessory muscle use.  Cardiovascular: Regular rate and rhythm, no murmurs / rubs / gallops. No extremity edema. 2+ pedal  pulses. No carotid bruits.  Abdomen: no tenderness, no masses palpated, no hepatosplenomegaly. Bowel sounds positive.  Musculoskeletal: no clubbing / cyanosis. No joint deformity upper and lower extremities. Good ROM, no contractures, no atrophy. Normal muscle tone.  Skin: no rashes, lesions, ulcers. No induration Neurologic: CN 2-12 grossly intact. Sensation intact. Strength 5/5 in all 4.  Psychiatric: Normal judgment and insight. Alert and oriented x 3. Normal mood.   EKG: Independently reviewed, showing sinus rhythm, rate of 68, QTc 435  Chest x-ray on Admission: Personally reviewed and I agree with radiologist reading as below.  DG Chest 1 View  Result Date: 11/17/2020 CLINICAL DATA:  [name].  Chest pain. EXAM: CHEST  1 VIEW COMPARISON:  One-view chest  x-ray 07/08/2020 FINDINGS: The heart size is normal. Atherosclerotic changes are noted in the aorta. Lung volumes are low. No focal airspace disease is present. Degenerative changes are noted at the shoulders, right greater than left. IMPRESSION: 1. Low lung volumes. 2. No acute cardiopulmonary disease. Electronically Signed   By: Marin Roberts M.D.   On: 11/17/2020 14:05   CT Angio Chest PE W and/or Wo Contrast  Result Date: 11/17/2020 CLINICAL DATA:  86 year old female with history of positive D-dimer, leg swelling. EXAM: CT ANGIOGRAPHY CHEST WITH CONTRAST TECHNIQUE: Multidetector CT imaging of the chest was performed using the standard protocol during bolus administration of intravenous contrast. Multiplanar CT image reconstructions and MIPs were obtained to evaluate the vascular anatomy. CONTRAST:  Sixty mL Omnipaque 350, intravenous COMPARISON:  10/21/2017 FINDINGS: Cardiovascular: Satisfactory opacification of the pulmonary arteries to the segmental level. No evidence of pulmonary embolism. Normal caliber main pulmonary artery measuring up to 30 mm, unchanged. The RV to LV ratio is normal. Mild global cardiomegaly. No pericardial effusion. Unchanged mild ectasia of the ascending thoracic aorta. Scattered atherosclerotic calcifications of the descending thoracic aorta. Mild coronary atherosclerotic calcifications. Mediastinum/Nodes: No enlarged mediastinal, hilar, or axillary lymph nodes. Thyroid gland, trachea, and esophagus demonstrate no significant findings. Lungs/Pleura: Unchanged polygonal solid pulmonary nodule in the right upper lobe measuring up to 4 mm, favored represent a lymph node. No new worrisome pulmonary nodules. No focal consolidations, pleural effusion, or pneumothorax. Upper Abdomen: Moderate hiatal hernia similar to 2018 comparison without evidence of strangulation. The remaining visualized upper abdomen is within normal limits. Musculoskeletal: No chest wall abnormality. No  acute or significant osseous findings. Review of the MIP images confirms the above findings. IMPRESSION: Vascular: 1. No evidence of acute pulmonary embolism to the subsegmental level. 2.  Aortic Atherosclerosis (ICD10-I70.0). Non-Vascular: 1. Moderate hiatal hernia, unchanged. 2. No acute intrathoracic abnormality. Marliss Coots, MD [facility] Electronically Signed   By: Marliss Coots MD   On: 11/17/2020 15:56   US Venous Img Lower Bilateral  Result Date: 11/17/2020 CLINICAL DATA:  Bilateral leg pain and tightness EXAM: BILATERAL LOWER EXTREMITY VENOUS DOPPLER ULTRASOUND TECHNIQUE: Gray-scale sonography with graded compression, as well as color Doppler and duplex ultrasound were performed to evaluate the lower extremity deep venous systems from the level of the common femoral vein and including the common femoral, femoral, profunda femoral, popliteal and calf veins including the posterior tibial, peroneal and gastrocnemius veins when visible. The superficial great saphenous vein was also interrogated. Spectral Doppler was utilized to evaluate flow at rest and with distal augmentation maneuvers in the common femoral, femoral and popliteal veins. COMPARISON:  None. FINDINGS: RIGHT LOWER EXTREMITY Common Femoral Vein: No evidence of thrombus. Normal compressibility, respiratory phasicity and response to  augmentation. Saphenofemoral Junction: No evidence of thrombus. Normal compressibility and flow on color Doppler imaging. Profunda Femoral Vein: No evidence of thrombus. Normal compressibility and flow on color Doppler imaging. Femoral Vein: No evidence of thrombus. Normal compressibility, respiratory phasicity and response to augmentation. Popliteal Vein: No evidence of thrombus. Normal compressibility, respiratory phasicity and response to augmentation. Calf Veins: No evidence of thrombus. Normal compressibility and flow on color Doppler imaging. LEFT  LOWER EXTREMITY Common Femoral Vein: No evidence of thrombus. Normal compressibility, respiratory phasicity and response to augmentation. Saphenofemoral Junction: No evidence of thrombus. Normal compressibility and flow on color Doppler imaging. Profunda Femoral Vein: No evidence of thrombus. Normal compressibility and flow on color Doppler imaging. Femoral Vein: No evidence of thrombus. Normal compressibility, respiratory phasicity and response to augmentation. Popliteal Vein: No evidence of thrombus. Normal compressibility, respiratory phasicity and response to augmentation. Calf Veins: No evidence of thrombus. Normal compressibility and flow on color Doppler imaging. IMPRESSION: No evidence of deep venous thrombosis in either lower extremity. Electronically Signed   By: Judie Petit.  Shick M.D.   On: 11/17/2020 14:57   Labs on Admission: I have personally reviewed following labs  CBC: Recent Labs  Lab 11/17/20 1343  WBC 7.2  NEUTROABS 4.6  HGB 12.4  HCT 36.1  MCV 99.2  PLT 177   Basic Metabolic Panel: Recent Labs  Lab 11/17/20 1343  NA 137  K 3.9  CL 103  CO2 26  GLUCOSE 118*  BUN 21  CREATININE 1.40*  CALCIUM 8.7*   GFR: Estimated Creatinine Clearance: 27.3 mL/min (A) (by C-G formula based on SCr of 1.4 mg/dL (H)). Liver Function Tests: Recent Labs  Lab 11/17/20 1343  AST 16  ALT 8  ALKPHOS 55  BILITOT 1.1  PROT 6.7  ALBUMIN 3.7   No results for input(s): LIPASE, AMYLASE in the last 168 hours. No results for input(s): AMMONIA in the last 168 hours. Coagulation Profile: Recent Labs  Lab 11/17/20 1446  INR 0.9   Thyroid Function Tests: Recent Labs    11/17/20 2219  TSH 1.674   Urine analysis:    Component Value Date/Time   COLORURINE YELLOW (A) 06/11/2017 1445   APPEARANCEUR CLEAR (A) 06/11/2017 1445   LABSPEC 1.014 06/11/2017 1445   PHURINE 7.0 06/11/2017 1445   GLUCOSEU NEGATIVE 06/11/2017 1445   HGBUR NEGATIVE 06/11/2017 1445   BILIRUBINUR NEGATIVE  06/11/2017 1445   KETONESUR NEGATIVE 06/11/2017 1445   PROTEINUR NEGATIVE 06/11/2017 1445   NITRITE NEGATIVE 06/11/2017 1445   LEUKOCYTESUR NEGATIVE 06/11/2017 1445   Kaitelyn Jamison N Bao Coreas D.O. [facility]  If 12AM-7AM, please contact overnight-coverage provider If 7AM-7PM, please contact day coverage provider [email or web address]  11/18/2020, 1:22 AM

## 2020-11-18 DIAGNOSIS — I5033 Acute on chronic diastolic (congestive) heart failure: Secondary | ICD-10-CM | POA: Diagnosis not present

## 2020-11-18 DIAGNOSIS — Z96653 Presence of artificial knee joint, bilateral: Secondary | ICD-10-CM | POA: Diagnosis present

## 2020-11-18 DIAGNOSIS — Z9861 Coronary angioplasty status: Secondary | ICD-10-CM | POA: Diagnosis not present

## 2020-11-18 DIAGNOSIS — I340 Nonrheumatic mitral (valve) insufficiency: Secondary | ICD-10-CM | POA: Diagnosis present

## 2020-11-18 DIAGNOSIS — I6521 Occlusion and stenosis of right carotid artery: Secondary | ICD-10-CM | POA: Diagnosis present

## 2020-11-18 DIAGNOSIS — Z791 Long term (current) use of non-steroidal anti-inflammatories (NSAID): Secondary | ICD-10-CM | POA: Diagnosis not present

## 2020-11-18 DIAGNOSIS — D869 Sarcoidosis, unspecified: Secondary | ICD-10-CM | POA: Diagnosis present

## 2020-11-18 DIAGNOSIS — M5090 Cervical disc disorder, unspecified, unspecified cervical region: Secondary | ICD-10-CM | POA: Diagnosis present

## 2020-11-18 DIAGNOSIS — K5900 Constipation, unspecified: Secondary | ICD-10-CM | POA: Diagnosis not present

## 2020-11-18 DIAGNOSIS — I214 Non-ST elevation (NSTEMI) myocardial infarction: Secondary | ICD-10-CM | POA: Diagnosis present

## 2020-11-18 DIAGNOSIS — G629 Polyneuropathy, unspecified: Secondary | ICD-10-CM | POA: Diagnosis present

## 2020-11-18 DIAGNOSIS — I2510 Atherosclerotic heart disease of native coronary artery without angina pectoris: Secondary | ICD-10-CM | POA: Diagnosis present

## 2020-11-18 DIAGNOSIS — Z20822 Contact with and (suspected) exposure to covid-19: Secondary | ICD-10-CM | POA: Diagnosis present

## 2020-11-18 DIAGNOSIS — I700 Atherosclerosis of aorta: Secondary | ICD-10-CM | POA: Diagnosis present

## 2020-11-18 DIAGNOSIS — N1832 Chronic kidney disease, stage 3b: Secondary | ICD-10-CM | POA: Diagnosis present

## 2020-11-18 DIAGNOSIS — H9190 Unspecified hearing loss, unspecified ear: Secondary | ICD-10-CM | POA: Diagnosis present

## 2020-11-18 DIAGNOSIS — R0982 Postnasal drip: Secondary | ICD-10-CM | POA: Diagnosis not present

## 2020-11-18 DIAGNOSIS — R778 Other specified abnormalities of plasma proteins: Secondary | ICD-10-CM | POA: Diagnosis present

## 2020-11-18 DIAGNOSIS — G4733 Obstructive sleep apnea (adult) (pediatric): Secondary | ICD-10-CM | POA: Diagnosis present

## 2020-11-18 DIAGNOSIS — R0602 Shortness of breath: Secondary | ICD-10-CM | POA: Diagnosis not present

## 2020-11-18 DIAGNOSIS — K219 Gastro-esophageal reflux disease without esophagitis: Secondary | ICD-10-CM | POA: Diagnosis present

## 2020-11-18 DIAGNOSIS — R079 Chest pain, unspecified: Secondary | ICD-10-CM | POA: Diagnosis present

## 2020-11-18 DIAGNOSIS — Z96642 Presence of left artificial hip joint: Secondary | ICD-10-CM | POA: Diagnosis present

## 2020-11-18 DIAGNOSIS — I739 Peripheral vascular disease, unspecified: Secondary | ICD-10-CM | POA: Diagnosis present

## 2020-11-18 DIAGNOSIS — N179 Acute kidney failure, unspecified: Secondary | ICD-10-CM | POA: Diagnosis present

## 2020-11-18 DIAGNOSIS — E785 Hyperlipidemia, unspecified: Secondary | ICD-10-CM | POA: Diagnosis present

## 2020-11-18 DIAGNOSIS — I130 Hypertensive heart and chronic kidney disease with heart failure and stage 1 through stage 4 chronic kidney disease, or unspecified chronic kidney disease: Secondary | ICD-10-CM | POA: Diagnosis present

## 2020-11-18 DIAGNOSIS — G4730 Sleep apnea, unspecified: Secondary | ICD-10-CM | POA: Diagnosis not present

## 2020-11-18 LAB — CBC
HCT: 33.5 % — ABNORMAL LOW (ref 36.0–46.0)
Hemoglobin: 11.9 g/dL — ABNORMAL LOW (ref 12.0–15.0)
MCH: 34.6 pg — ABNORMAL HIGH (ref 26.0–34.0)
MCHC: 35.5 g/dL (ref 30.0–36.0)
MCV: 97.4 fL (ref 80.0–100.0)
Platelets: 176 10*3/uL (ref 150–400)
RBC: 3.44 MIL/uL — ABNORMAL LOW (ref 3.87–5.11)
RDW: 13.4 % (ref 11.5–15.5)
WBC: 6 10*3/uL (ref 4.0–10.5)
nRBC: 0 % (ref 0.0–0.2)

## 2020-11-18 LAB — BASIC METABOLIC PANEL
Anion gap: 10 (ref 5–15)
BUN: 17 mg/dL (ref 8–23)
CO2: 24 mmol/L (ref 22–32)
Calcium: 8.6 mg/dL — ABNORMAL LOW (ref 8.9–10.3)
Chloride: 105 mmol/L (ref 98–111)
Creatinine, Ser: 0.99 mg/dL (ref 0.44–1.00)
GFR, Estimated: 53 mL/min — ABNORMAL LOW (ref >60)
Glucose, Bld: 103 mg/dL — ABNORMAL HIGH (ref 70–99)
Potassium: 3.6 mmol/L (ref 3.5–5.1)
Sodium: 139 mmol/L (ref 135–145)

## 2020-11-18 LAB — TROPONIN I (HIGH SENSITIVITY): Troponin I (High Sensitivity): 2071 ng/L (ref <18)

## 2020-11-18 LAB — HEPARIN LEVEL (UNFRACTIONATED): Heparin Unfractionated: 0.53 IU/mL (ref 0.30–0.70)

## 2020-11-18 MED ORDER — LACTATED RINGERS IV SOLN: INTRAVENOUS | Status: DC

## 2020-11-18 MED ORDER — LATANOPROST 0.005 % OP SOLN
1.0000 [drp] | Freq: Every day | OPHTHALMIC | Status: DC
  Administered 2020-11-18 – 2020-11-19 (×2): 1 [drp] via OPHTHALMIC
  Filled 2020-11-18: qty 2.5

## 2020-11-18 MED ORDER — ISOSORBIDE MONONITRATE ER 30 MG PO TB24
30.0000 mg | ORAL_TABLET | Freq: Every day | ORAL | Status: DC
  Administered 2020-11-18 – 2020-11-20 (×3): 30 mg via ORAL
  Filled 2020-11-18 (×3): qty 1

## 2020-11-18 MED ORDER — POLYETHYLENE GLYCOL 3350 17 G PO PACK
17.0000 g | PACK | Freq: Every day | ORAL | Status: DC
  Administered 2020-11-18 – 2020-11-20 (×3): 17 g via ORAL
  Filled 2020-11-18 (×3): qty 1

## 2020-11-18 MED ORDER — ASPIRIN EC 81 MG PO TBEC
81.0000 mg | DELAYED_RELEASE_TABLET | Freq: Every day | ORAL | Status: DC
  Administered 2020-11-18 – 2020-11-20 (×3): 81 mg via ORAL
  Filled 2020-11-18 (×3): qty 1

## 2020-11-18 MED ORDER — NITROGLYCERIN 0.4 MG SL SUBL: 0.4000 mg | SUBLINGUAL_TABLET | SUBLINGUAL | Status: AC | PRN

## 2020-11-18 MED ORDER — GUAIFENESIN-DM 100-10 MG/5ML PO SYRP
5.0000 mL | ORAL_SOLUTION | ORAL | Status: DC | PRN
  Administered 2020-11-19 – 2020-11-20 (×2): 5 mL via ORAL
  Filled 2020-11-18 (×2): qty 5

## 2020-11-18 NOTE — Progress Notes (Signed)
PROGRESS NOTE   Sheila Long  IEP:[id number] DOB: 05-Nov-1924 DOA: 11/17/2020 PCP: [name], MD   Brief Narrative:   86 year old white female Known dysphagia of food impaction per GI Dr. [name] on endoscopy 06/26/2020 Chronic bronchitis and sarcoidosis OSA followed by Dr. Nicholos Johns pulmonary Cervical disc disease and diaphragmatic hernia Cervical neuropathy diagnosed on June 2021 with right hand tremor on Sinemet Previously seen by Dr. Darrold Junker 2018 for dizziness-carotid duplex 50-69 stenosis right side Holter showed PVC some sinus bradycardia-underwent Lexiscan can 05/03/2017 normal function no scar Prior admission early 2018 for possibility of heart failure sent home on Lasix  Admitted with shortness of breath-CT chest negative for PE Found to have mildly elevated BNP 133- troponin was 899 and peaked to 2071 Cardiology was consulted  Assessment & Plan:   Principal Problem:   [name] Active Problems:   Sarcoidosis   Hypertension   Coronary artery disease   GERD (gastroesophageal reflux disease)   Peripheral vascular disease (HCC)   Osteoarthritis of both knees   Chronic diastolic heart failure (HCC)   Anxiety   Dyslipidemia   1. Acute coronary syndrome NSTEMI a. Troponin trending upwards-continue heparin GTT stop date 11/20 1 PM b. increased to Toprol-XL 100, Imdur 30 added 11/20-aspirin c. We'll cut back fluid rate from 125 cc an hour to 50 cc an hour 2. Right carotid stenosis a. Continue aspirin at this time not really a candidate for outpatient CEA 3. Chronic bronchitis sarcoidosis OSA a. Seems currently stable at this time b. Doesn't seem to be steroids  4. Cervical disc disease with neuropathy 5. Tremor a. Continue Sinemet ir 25-100  b. Outpatient pain management in addition to further care 6. Multiple prior orthopedic issues 7. 4 mm pulmonary nodule a. Does not appear significant and does not have concerning  features b. Outpatient coordination if felt necessary in 8. AKI on admission CKD 3 a. Improved-see discussion regarding fluids as above-May need to discontinue ARB/HCTZ combination b. Daily weights  DVT prophylaxis: heparin Code Status:  Family Communication: Niece who patient identifies as [facility] POA Bonita Quin [phone] and updated Disposition:  Status is: Observation  The patient will require care spanning > 2 midnights and should be moved to inpatient because: Persistent severe electrolyte disturbances, Altered mental status and Ongoing diagnostic testing needed not appropriate for outpatient work up  Dispo: The patient is from: Home              Anticipated d/c is to: Home              Anticipated d/c date is: 2 days              Patient currently is not medically stable to d/c.   Consultants:   Cardiologist  Procedures: CT chest  Antimicrobials: None currently   Subjective: Awake alert coherent extremely hard of hearing and quite difficult to communicate with at times however can mow 3 "I do not feel too good" but I am not sure what to tell you" She does not endorse dark stool tarry stool chest pain nausea vomiting She is awake coherent and able to give me very good history and recall her sister's phone number  Objective: Vitals:   11/17/20 2104 11/18/20 0324 11/18/20 0500 11/18/20 0834  BP: 139/71 (!) 114/48  (!) 141/72  Pulse: 69 68  69  Resp: 17 17  16   Temp: 98.3 F (36.8 C) 97.8 F (36.6 C)  98.1 F (36.7 C)  TempSrc: Oral Oral  Oral  SpO2: 94% 96%  98%  Weight:   95.2 kg   Height:       No intake or output data in the 24 hours ending 11/18/20 0909 Filed Weights   11/17/20 1409 11/17/20 1829 11/18/20 0500  Weight: 95.3 kg 94.2 kg 95.2 kg    Examination: EOMI NCAT no focal deficit Chest clear without rales rhonchi Neck thick Mallampati 4 Abdomen obese nontender no rebound no guarding Do not appreciate any tremor-finger-nose-finger limited on left side  because of IV in antecubital fossa Power to lower extremities 5/5 for rest of neurological exam deferred    Data Reviewed: I have personally reviewed following labs and imaging studies BUNs/creatinine 21/1.4--gram/70/0.99 Hemoglobin down from 12.4-11.9 Platelet 176 WBC 6   Radiology Studies: DG Chest 1 View  Result Date: 11/17/2020 CLINICAL DATA:  [name].  Chest pain. EXAM: CHEST  1 VIEW COMPARISON:  One-view chest x-ray 07/08/2020 FINDINGS: The heart size is normal. Atherosclerotic changes are noted in the aorta. Lung volumes are low. No focal airspace disease is present. Degenerative changes are noted at the shoulders, right greater than left. IMPRESSION: 1. Low lung volumes. 2. No acute cardiopulmonary disease. Electronically Signed   By: Marin Roberts M.D.   On: 11/17/2020 14:05   CT Angio Chest PE W and/or Wo Contrast  Result Date: 11/17/2020 CLINICAL DATA:  86 year old female with history of positive D-dimer, leg swelling. EXAM: CT ANGIOGRAPHY CHEST WITH CONTRAST TECHNIQUE: Multidetector CT imaging of the chest was performed using the standard protocol during bolus administration of intravenous contrast. Multiplanar CT image reconstructions and MIPs were obtained to evaluate the vascular anatomy. CONTRAST:  Sixty mL Omnipaque 350, intravenous COMPARISON:  10/21/2017 FINDINGS: Cardiovascular: Satisfactory opacification of the pulmonary arteries to the segmental level. No evidence of pulmonary embolism. Normal caliber main pulmonary artery measuring up to 30 mm, unchanged. The RV to LV ratio is normal. Mild global cardiomegaly. No pericardial effusion. Unchanged mild ectasia of the ascending thoracic aorta. Scattered atherosclerotic calcifications of the descending thoracic aorta. Mild coronary atherosclerotic calcifications. Mediastinum/Nodes: No enlarged mediastinal, hilar, or axillary lymph nodes. Thyroid gland, trachea, and esophagus demonstrate no significant  findings. Lungs/Pleura: Unchanged polygonal solid pulmonary nodule in the right upper lobe measuring up to 4 mm, favored represent a lymph node. No new worrisome pulmonary nodules. No focal consolidations, pleural effusion, or pneumothorax. Upper Abdomen: Moderate hiatal hernia similar to 2018 comparison without evidence of strangulation. The remaining visualized upper abdomen is within normal limits. Musculoskeletal: No chest wall abnormality. No acute or significant osseous findings. Review of the MIP images confirms the above findings. IMPRESSION: Vascular: 1. No evidence of acute pulmonary embolism to the subsegmental level. 2.  Aortic Atherosclerosis (ICD10-I70.0). Non-Vascular: 1. Moderate hiatal hernia, unchanged. 2. No acute intrathoracic abnormality. Marliss Coots, MD [facility] Electronically Signed   By: Marliss Coots MD   On: 11/17/2020 15:56   US Venous Img Lower Bilateral  Result Date: 11/17/2020 CLINICAL DATA:  Bilateral leg pain and tightness EXAM: BILATERAL LOWER EXTREMITY VENOUS DOPPLER ULTRASOUND TECHNIQUE: Gray-scale sonography with graded compression, as well as color Doppler and duplex ultrasound were performed to evaluate the lower extremity deep venous systems from the level of the common femoral vein and including the common femoral, femoral, profunda femoral, popliteal and calf veins including the posterior tibial, peroneal and gastrocnemius veins when visible. The superficial great saphenous vein was also interrogated. Spectral Doppler was utilized to evaluate flow at rest and with distal  augmentation maneuvers in the common femoral, femoral and popliteal veins. COMPARISON:  None. FINDINGS: RIGHT LOWER EXTREMITY Common Femoral Vein: No evidence of thrombus. Normal compressibility, respiratory phasicity and response to augmentation. Saphenofemoral Junction: No evidence of thrombus. Normal compressibility and flow on color  Doppler imaging. Profunda Femoral Vein: No evidence of thrombus. Normal compressibility and flow on color Doppler imaging. Femoral Vein: No evidence of thrombus. Normal compressibility, respiratory phasicity and response to augmentation. Popliteal Vein: No evidence of thrombus. Normal compressibility, respiratory phasicity and response to augmentation. Calf Veins: No evidence of thrombus. Normal compressibility and flow on color Doppler imaging. LEFT LOWER EXTREMITY Common Femoral Vein: No evidence of thrombus. Normal compressibility, respiratory phasicity and response to augmentation. Saphenofemoral Junction: No evidence of thrombus. Normal compressibility and flow on color Doppler imaging. Profunda Femoral Vein: No evidence of thrombus. Normal compressibility and flow on color Doppler imaging. Femoral Vein: No evidence of thrombus. Normal compressibility, respiratory phasicity and response to augmentation. Popliteal Vein: No evidence of thrombus. Normal compressibility, respiratory phasicity and response to augmentation. Calf Veins: No evidence of thrombus. Normal compressibility and flow on color Doppler imaging. IMPRESSION: No evidence of deep venous thrombosis in either lower extremity. Electronically Signed   By: Judie Petit.  Shick M.D.   On: 11/17/2020 14:57     Scheduled Meds: . aspirin EC  81 mg Oral Daily  . carbidopa-levodopa  1 tablet Oral QHS  . irbesartan  300 mg Oral Daily   And  . hydrochlorothiazide  12.5 mg Oral Daily  . isosorbide mononitrate  30 mg Oral Daily  . metoprolol succinate  100 mg Oral Daily   Continuous Infusions: . heparin 1,000 Units/hr (11/17/20 1525)  . lactated ringers 125 mL/hr at 11/18/20 0233     LOS: 0 days    Time spent: 12 called  Rhetta Mura, MD [facility] To contact the attending provider between 7A-7P or the covering provider during after hours 7P-7A, please log into the web site [email or web address] and access using universal [place] password  for that web site. If you do not have the password, please call the hospital operator.  11/18/2020, 9:09 AM

## 2020-11-18 NOTE — Progress Notes (Signed)
ANTICOAGULATION CONSULT NOTE   Pharmacy Consult for heparin infusion Indication: chest pain/ACS/STEMI  Patient Measurements: Height: 5\' 4"  (162.6 cm) Weight: 95.2 kg (209 lb 12.8 oz) IBW/kg (Calculated) : 54.7 Heparin Dosing Weight: 76.5 kg  Vital Signs: Temp: 97.8 F (36.6 C) (11/20 0324) Temp Source: Oral (11/20 0324) BP: 114/48 (11/20 0324) Pulse Rate: 68 (11/20 0324)  Labs: Recent Labs    11/17/20 1343 11/17/20 1343 11/17/20 1446 11/17/20 1526 11/17/20 2219 11/18/20 0013 11/18/20 0543  HGB 12.4  --   --   --   --   --  11.9*  HCT 36.1  --   --   --   --   --  33.5*  PLT 177  --   --   --   --   --  176  APTT  --   --  28  --   --   --   --   LABPROT  --   --  12.2  --   --   --   --   INR  --   --  0.9  --   --   --   --   HEPARINUNFRC  --   --   --   --  0.50  --  0.53  CREATININE 1.40*  --   --   --   --   --  0.99  TROPONINIHS 317*   < >  --  899* 1,742* 2,071*  --    < > = values in this interval not displayed.    Estimated Creatinine Clearance: 38.9 mL/min (by C-G formula based on SCr of 0.99 mg/dL).   Medical History: Past Medical History:  Diagnosis Date  . Allergic rhinitis due to pollen   . Anxiety   . Aortic atherosclerosis (HCC)   . CHF (congestive heart failure) (HCC)   . Coronary artery disease    Stent in 2000's (DC)  . Dyspnea   . GERD (gastroesophageal reflux disease)   . Hard of hearing   . Hypertension   . Neuropathy   . Obstructive sleep apnea   . Osteoarthritis of both knees   . Peripheral vascular disease (HCC)   . Sarcoidosis   . Thyroid nodule      Assessment: 86 yo female with PMH of CHF, CAD, HTN presenting with leg swelling & numbness. Home med hx incomplete at this time.  Past med hx shows HTN meds, but no indication of anticoags.  1st HS troponin 317, Fibrin D-dimer 1117, H&H, Plts WNL.  INR and aPTT ordered and pending results.   Goal of Therapy:  Heparin level 0.3-0.7 units/ml Monitor platelets by anticoagulation  protocol: Yes   Plan:  11/19:  HL @ 2219 = 0.5 Will continue pt on current rate and recheck HL 6 hrs after rate change.   11/20: HL @ 0543 = 0.53 Will continue pt on current rate and recheck HL on 11/21 with AM labs.   Emile Ringgenberg D 11/18/2020 7:05 AM

## 2020-11-18 NOTE — Plan of Care (Signed)
  Problem: Education: Goal: Ability to demonstrate management of disease process will improve Outcome: Not Progressing Goal: Ability to verbalize understanding of medication therapies will improve Outcome: Not Progressing Goal: Individualized Educational Video(s) Outcome: Not Progressing   Problem: Activity: Goal: Capacity to carry out activities will improve Outcome: Not Progressing   Problem: Cardiac: Goal: Ability to achieve and maintain adequate cardiopulmonary perfusion will improve Outcome: Not Progressing   Problem: Education: Goal: Knowledge of General Education information will improve Description: Including pain rating scale, medication(s)/side effects and non-pharmacologic comfort measures Outcome: Not Progressing   Problem: Health Behavior/Discharge Planning: Goal: Ability to manage health-related needs will improve Outcome: Not Progressing   Problem: Clinical Measurements: Goal: Ability to maintain clinical measurements within normal limits will improve Outcome: Not Progressing Goal: Will remain free from infection Outcome: Not Progressing Goal: Diagnostic test results will improve Outcome: Not Progressing Goal: Respiratory complications will improve Outcome: Not Progressing Goal: Cardiovascular complication will be avoided Outcome: Not Progressing   Problem: Activity: Goal: Risk for activity intolerance will decrease Outcome: Not Progressing   Problem: Nutrition: Goal: Adequate nutrition will be maintained Outcome: Not Progressing   Problem: Coping: Goal: Level of anxiety will decrease Outcome: Not Progressing   Problem: Elimination: Goal: Will not experience complications related to bowel motility Outcome: Not Progressing Goal: Will not experience complications related to urinary retention Outcome: Not Progressing   Problem: Pain Managment: Goal: General experience of comfort will improve Outcome: Not Progressing   Problem: Safety: Goal:  Ability to remain free from injury will improve Outcome: Not Progressing   Problem: Skin Integrity: Goal: Risk for impaired skin integrity will decrease Outcome: Not Progressing   Problem: Education: Goal: Ability to demonstrate management of disease process will improve Outcome: Not Progressing Goal: Ability to verbalize understanding of medication therapies will improve Outcome: Not Progressing Goal: Individualized Educational Video(s) Outcome: Not Progressing   Problem: Activity: Goal: Capacity to carry out activities will improve Outcome: Not Progressing   Problem: Cardiac: Goal: Ability to achieve and maintain adequate cardiopulmonary perfusion will improve Outcome: Not Progressing   Problem: Education: Goal: Knowledge of General Education information will improve Description: Including pain rating scale, medication(s)/side effects and non-pharmacologic comfort measures Outcome: Not Progressing   Problem: Health Behavior/Discharge Planning: Goal: Ability to manage health-related needs will improve Outcome: Not Progressing   Problem: Clinical Measurements: Goal: Ability to maintain clinical measurements within normal limits will improve Outcome: Not Progressing Goal: Will remain free from infection Outcome: Not Progressing Goal: Diagnostic test results will improve Outcome: Not Progressing Goal: Respiratory complications will improve Outcome: Not Progressing Goal: Cardiovascular complication will be avoided Outcome: Not Progressing   Problem: Activity: Goal: Risk for activity intolerance will decrease Outcome: Not Progressing   Problem: Nutrition: Goal: Adequate nutrition will be maintained Outcome: Not Progressing   Problem: Coping: Goal: Level of anxiety will decrease Outcome: Not Progressing   Problem: Elimination: Goal: Will not experience complications related to bowel motility Outcome: Not Progressing Goal: Will not experience complications related  to urinary retention Outcome: Not Progressing   Problem: Pain Managment: Goal: General experience of comfort will improve Outcome: Not Progressing   Problem: Safety: Goal: Ability to remain free from injury will improve Outcome: Not Progressing   Problem: Skin Integrity: Goal: Risk for impaired skin integrity will decrease Outcome: Not Progressing

## 2020-11-18 NOTE — Progress Notes (Signed)
ANTICOAGULATION CONSULT NOTE   Pharmacy Consult for heparin infusion Indication: chest pain/ACS/STEMI  Patient Measurements: Height: 5\' 4"  (162.6 cm) Weight: 94.2 kg (207 lb 11.2 oz) IBW/kg (Calculated) : 54.7 Heparin Dosing Weight: 76.5 kg  Vital Signs: Temp: 98.3 F (36.8 C) (11/19 2104) Temp Source: Oral (11/19 2104) BP: 139/71 (11/19 2104) Pulse Rate: 69 (11/19 2104)  Labs: Recent Labs    11/17/20 1343 11/17/20 1343 11/17/20 1446 11/17/20 1526 11/17/20 2219 11/18/20 0013  HGB 12.4  --   --   --   --   --   HCT 36.1  --   --   --   --   --   PLT 177  --   --   --   --   --   APTT  --   --  28  --   --   --   LABPROT  --   --  12.2  --   --   --   INR  --   --  0.9  --   --   --   HEPARINUNFRC  --   --   --   --  0.50  --   CREATININE 1.40*  --   --   --   --   --   TROPONINIHS 317*   < >  --  899* 1,742* 2,071*   < > = values in this interval not displayed.    Estimated Creatinine Clearance: 27.3 mL/min (A) (by C-G formula based on SCr of 1.4 mg/dL (H)).   Medical History: Past Medical History:  Diagnosis Date  . Allergic rhinitis due to pollen   . Anxiety   . Aortic atherosclerosis (HCC)   . CHF (congestive heart failure) (HCC)   . Coronary artery disease    Stent in 2000's (DC)  . Dyspnea   . GERD (gastroesophageal reflux disease)   . Hard of hearing   . Hypertension   . Neuropathy   . Obstructive sleep apnea   . Osteoarthritis of both knees   . Peripheral vascular disease (HCC)   . Sarcoidosis   . Thyroid nodule      Assessment: 86 yo female with PMH of CHF, CAD, HTN presenting with leg swelling & numbness. Home med hx incomplete at this time.  Past med hx shows HTN meds, but no indication of anticoags.  1st HS troponin 317, Fibrin D-dimer 1117, H&H, Plts WNL.  INR and aPTT ordered and pending results.   Goal of Therapy:  Heparin level 0.3-0.7 units/ml Monitor platelets by anticoagulation protocol: Yes   Plan:  11/20:  HL @ 2219 =  0.5 Will continue pt on current rate and recheck HL 6 hrs after rate change.   Karigan Cloninger D 11/18/2020 1:38 AM

## 2020-11-18 NOTE — Consult Note (Signed)
[facility] Cardiology Consultation Note  Patient ID: Sheila Long, MRN: [id number], DOB/AGE: [birth year] 86 y.o. Admit date: 11/17/2020   Date of Consult: 11/18/2020 Primary Physician: [name], MD Primary Cardiologist: Paraschos  Chief Complaint:  Chief Complaint  Patient presents with  . Leg Swelling   Reason for Consult: [name] chest pain  HPI: 86 y.o. female with known coronary artery disease status post remote stenting hypertension hyperlipidemia chronic kidney disease stage III with a glomerular filtration rate of 35 sarcoidosis with significant progression of issues of shortness of breath and chest discomfort with activity.  There is also some other issues with left arm discomfort which may or may not be related to symptoms of chest discomfort.  When seen in the emergency room the patient had an EKG showing normal sinus rhythm with nonspecific ST changes a glomerular filtration rate of 35 a BNP of 133 a troponin of 317, 899, 1742, 2071.  This would suggest that the patient may have a non-ST elevation myocardial infarction with her chest discomfort with no significant EKG changes.  Currently she has felt quite well when admitted to hospital and placed on a heparin drip and with continued medication management with metoprolol.  We have discussed at length further diagnostic testing and treatment options at 86 years old for which we would prefer medication management at this time unless she has significant progression of symptoms not ability to discharge to home.  Currently the patient does have a chest x-ray which is normal suggesting no evidence of congestive heart failure.  Past Medical History:  Diagnosis Date  . Allergic rhinitis due to pollen   . Anxiety   . Aortic atherosclerosis (HCC)   . CHF (congestive heart failure) (HCC)   . Coronary artery disease    Stent in 2000's (DC)  . Dyspnea   . GERD (gastroesophageal reflux disease)   . Hard of hearing    . Hypertension   . Neuropathy   . Obstructive sleep apnea   . Osteoarthritis of both knees   . Peripheral vascular disease (HCC)   . Sarcoidosis   . Thyroid nodule       Surgical History:  Past Surgical History:  Procedure Laterality Date  . ABDOMINAL HYSTERECTOMY    . BIOPSY THYROID  2014   Dr Andee Poles  . BREAST EXCISIONAL BIOPSY Left 1980s   surgical bx neg  . BREAST EXCISIONAL BIOPSY Left 1980s   benign  . CARDIOVASCULAR STRESS TEST  05/12/2012   with dobutamine---negative. Normal LV function  . CORONARY ANGIOPLASTY  2006  . ESOPHAGOGASTRODUODENOSCOPY (EGD) WITH PROPOFOL N/A 04/03/2020   Procedure: ESOPHAGOGASTRODUODENOSCOPY (EGD) WITH PROPOFOL;  Surgeon: [name], MD;  Location: [facility] ENDOSCOPY;  Service: Gastroenterology;  Laterality: N/A;  . JOINT REPLACEMENT Left 2000   hip  . JOINT REPLACEMENT Right 2007   hip  . KNEE ARTHROPLASTY Right 06/23/2017   Procedure: COMPUTER ASSISTED TOTAL KNEE ARTHROPLASTY;  Surgeon: Donato Heinz, MD;  Location: [facility] ORS;  Service: Orthopedics;  Laterality: Right;     Home Meds: Prior to Admission medications   Medication Sig Start Date End Date Taking? Authorizing Provider  aspirin EC 81 MG tablet Take 81 mg by mouth daily.    Yes [provider]  carbidopa-levodopa (SINEMET IR) 25-100 MG tablet Take 1 tablet by mouth at bedtime.    Yes [provider]  latanoprost (XALATAN) 0.005 % ophthalmic solution Place 1 drop into both eyes at bedtime. 05/30/17  Yes [provider]  metoprolol succinate (TOPROL-XL) 100 MG 24 hr tablet Take 100 mg by mouth daily. 10/21/20  Yes [provider]  olmesartan-hydrochlorothiazide (BENICAR HCT) 40-12.5 MG tablet Take 1 tablet by mouth daily. 06/23/20  Yes [provider]    Inpatient Medications:  . carbidopa-levodopa  1 tablet Oral QHS  . irbesartan  300 mg Oral Daily   And  . hydrochlorothiazide  12.5 mg Oral Daily  . metoprolol succinate  100 mg Oral  Daily   . heparin 1,000 Units/hr (11/17/20 1525)  . lactated ringers 125 mL/hr at 11/18/20 1610    Allergies:  Allergies  Allergen Reactions  . Tramadol Other (See Comments)    CONFUSION  . Sulfa Antibiotics     Patient does not recall  . Zocor [Simvastatin] Other (See Comments)    INTOLERANCE-MYALGIAS     Social History   Socioeconomic History  . Marital status: Widowed    Spouse name: Not on file  . Number of children: 0  . Years of education: Not on file  . Highest education level: Not on file  Occupational History  . 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 Smoking status: Never Smoker  . Smokeless tobacco: Never Used  Vaping Use  . Vaping Use: Never used  Substance and Sexual Activity  . Alcohol use: No  . Drug use: No  . Sexual activity: Not Currently    Birth control/protection: Post-menopausal  Other Topics Concern  . Not on file  Social History Narrative   Past living will   Needs to redo health care POA   Requests sister [employer] as decision makers   Would accept resuscitation   Not sure about tube feeds   Social Determinants of Health   Financial Resource Strain:   . Difficulty of Paying Living Expenses: Not on file  Food Insecurity:   . Worried About Programme researcher, broadcasting/film/video in the Last Year: Not on file  . Ran Out of Food in the Last Year: Not on file  Transportation Needs:   . Lack of Transportation (Medical): Not on file  . Lack of Transportation (Non-Medical): Not on file  Physical Activity:   . Days of Exercise per Week: Not on file  . Minutes of Exercise per Session: Not on file  Stress:   . Feeling of Stress : Not on file  Social Connections:   . Frequency of Communication with Friends and Family: Not on file  . Frequency of Social Gatherings with Friends and Family: Not on file  . Attends Religious Services: Not on file  . Active Member of Clubs or Organizations: Not on file  . Attends Tax inspector Meetings: Not on file  . Marital Status: Not on file  Intimate Partner Violence:   . Fear of Current or Ex-Partner: Not on file  . Emotionally Abused: Not on file  . Physically Abused: Not on file  . Sexually Abused: Not on file     Family History  Problem Relation Age of Onset  . Cancer Sister 19       unknown - groin area  . Heart attack Brother   . Cancer Sister 83       pancreatic   . Heart attack Sister   . Heart attack Brother      Review of Systems Positive for chest pain shortness of breath Negative for: General:  chills, fever, night sweats or weight changes.  Cardiovascular: PND orthopnea syncope  dizziness  Dermatological skin lesions rashes Respiratory: Cough congestion Urologic: Frequent urination urination at night and hematuria Abdominal: negative for nausea, vomiting, diarrhea, bright red blood per rectum, melena, or hematemesis Neurologic: negative for visual changes, and/or hearing changes  All other systems reviewed and are otherwise negative except as noted above.  Labs: No results for input(s): CKTOTAL, CKMB, TROPONINI in the last 72 hours. Lab Results  Component Value Date   WBC 6.0 11/18/2020   HGB 11.9 (L) 11/18/2020   HCT 33.5 (L) 11/18/2020   MCV 97.4 11/18/2020   PLT 176 11/18/2020    Recent Labs  Lab 11/17/20 1343 11/17/20 1343 11/18/20 0543  NA 137   < > 139  K 3.9   < > 3.6  CL 103   < > 105  CO2 26   < > 24  BUN 21   < > 17  CREATININE 1.40*   < > 0.99  CALCIUM 8.7*   < > 8.6*  PROT 6.7  --   --   BILITOT 1.1  --   --   ALKPHOS 55  --   --   ALT 8  --   --   AST 16  --   --   GLUCOSE 118*   < > 103*   < > = values in this interval not displayed.   No results found for: CHOL, HDL, LDLCALC, TRIG No results found for: DDIMER  Radiology/Studies:  DG Chest 1 View  Result Date: 11/17/2020 CLINICAL DATA:  [name].  Chest pain. EXAM: CHEST  1 VIEW COMPARISON:  One-view chest x-ray 07/08/2020 FINDINGS:  The heart size is normal. Atherosclerotic changes are noted in the aorta. Lung volumes are low. No focal airspace disease is present. Degenerative changes are noted at the shoulders, right greater than left. IMPRESSION: 1. Low lung volumes. 2. No acute cardiopulmonary disease. Electronically Signed   By: Marin Roberts M.D.   On: 11/17/2020 14:05   CT Angio Chest PE W and/or Wo Contrast  Result Date: 11/17/2020 CLINICAL DATA:  86 year old female with history of positive D-dimer, leg swelling. EXAM: CT ANGIOGRAPHY CHEST WITH CONTRAST TECHNIQUE: Multidetector CT imaging of the chest was performed using the standard protocol during bolus administration of intravenous contrast. Multiplanar CT image reconstructions and MIPs were obtained to evaluate the vascular anatomy. CONTRAST:  Sixty mL Omnipaque 350, intravenous COMPARISON:  10/21/2017 FINDINGS: Cardiovascular: Satisfactory opacification of the pulmonary arteries to the segmental level. No evidence of pulmonary embolism. Normal caliber main pulmonary artery measuring up to 30 mm, unchanged. The RV to LV ratio is normal. Mild global cardiomegaly. No pericardial effusion. Unchanged mild ectasia of the ascending thoracic aorta. Scattered atherosclerotic calcifications of the descending thoracic aorta. Mild coronary atherosclerotic calcifications. Mediastinum/Nodes: No enlarged mediastinal, hilar, or axillary lymph nodes. Thyroid gland, trachea, and esophagus demonstrate no significant findings. Lungs/Pleura: Unchanged polygonal solid pulmonary nodule in the right upper lobe measuring up to 4 mm, favored represent a lymph node. No new worrisome pulmonary nodules. No focal consolidations, pleural effusion, or pneumothorax. Upper Abdomen: Moderate hiatal hernia similar to 2018 comparison without evidence of strangulation. The remaining visualized upper abdomen is within normal limits. Musculoskeletal: No chest wall abnormality. No acute or significant osseous  findings. Review of the MIP images confirms the above findings. IMPRESSION: Vascular: 1. No evidence of acute pulmonary embolism to the subsegmental level. 2.  Aortic Atherosclerosis (ICD10-I70.0). Non-Vascular: 1. Moderate hiatal hernia, unchanged. 2. No acute intrathoracic abnormality. Marliss Coots, MD [facility] Electronically Signed   By: Marliss Cootsylan  Suttle MD   On: 11/17/2020 15:56   US Venous Img Lower Bilateral  Result Date: 11/17/2020 CLINICAL DATA:  Bilateral leg pain and tightness EXAM: BILATERAL LOWER EXTREMITY VENOUS DOPPLER ULTRASOUND TECHNIQUE: Gray-scale sonography with graded compression, as well as color Doppler and duplex ultrasound were performed to evaluate the lower extremity deep venous systems from the level of the common femoral vein and including the common femoral, femoral, profunda femoral, popliteal and calf veins including the posterior tibial, peroneal and gastrocnemius veins when visible. The superficial great saphenous vein was also interrogated. Spectral Doppler was utilized to evaluate flow at rest and with distal augmentation maneuvers in the common femoral, femoral and popliteal veins. COMPARISON:  None. FINDINGS: RIGHT LOWER EXTREMITY Common Femoral Vein: No evidence of thrombus. Normal compressibility, respiratory phasicity and response to augmentation. Saphenofemoral Junction: No evidence of thrombus. Normal compressibility and flow on color Doppler imaging. Profunda Femoral Vein: No evidence of thrombus. Normal compressibility and flow on color Doppler imaging. Femoral Vein: No evidence of thrombus. Normal compressibility, respiratory phasicity and response to augmentation. Popliteal Vein: No evidence of thrombus. Normal compressibility, respiratory phasicity and response to augmentation. Calf Veins: No evidence of thrombus. Normal compressibility and flow on color Doppler imaging. LEFT LOWER EXTREMITY Common Femoral  Vein: No evidence of thrombus. Normal compressibility, respiratory phasicity and response to augmentation. Saphenofemoral Junction: No evidence of thrombus. Normal compressibility and flow on color Doppler imaging. Profunda Femoral Vein: No evidence of thrombus. Normal compressibility and flow on color Doppler imaging. Femoral Vein: No evidence of thrombus. Normal compressibility, respiratory phasicity and response to augmentation. Popliteal Vein: No evidence of thrombus. Normal compressibility, respiratory phasicity and response to augmentation. Calf Veins: No evidence of thrombus. Normal compressibility and flow on color Doppler imaging. IMPRESSION: No evidence of deep venous thrombosis in either lower extremity. Electronically Signed   By: Judie PetitM.  Shick M.D.   On: 11/17/2020 14:57    EKG: Normal sinus rhythm with nonspecific ST changes  Weights: Filed Weights   11/17/20 1409 11/17/20 1829 11/18/20 0500  Weight: 95.3 kg 94.2 kg 95.2 kg     Physical Exam: Blood pressure (!) 114/48, pulse 68, temperature 97.8 F (36.6 C), temperature source Oral, resp. rate 17, height 5\' 4"  (1.626 m), weight 95.2 kg, SpO2 96 %. Body mass index is 36.01 kg/m. General: Well developed, well nourished, in no acute distress. Head eyes ears nose throat: Normocephalic, atraumatic, sclera non-icteric, no xanthomas, nares are without discharge. No apparent thyromegaly and/or mass  Lungs: Normal respiratory effort.  no wheezes, no rales, no rhonchi.  Few basilar crackles Heart: RRR with normal S1 S2. no murmur gallop, no rub, PMI is normal size and placement, carotid upstroke normal without bruit, jugular venous pressure is normal Abdomen: Soft, non-tender, non-distended with normoactive bowel sounds. No hepatomegaly. No rebound/guarding. No obvious abdominal masses. Abdominal aorta is normal size without bruit Extremities: Trace edema. no cyanosis, no clubbing, no ulcers  Peripheral : 2+ bilateral upper extremity pulses, 2+  bilateral femoral pulses, 2+ bilateral dorsal pedal pulse Neuro: Alert and oriented. No facial asymmetry. No focal deficit. Moves all extremities spontaneously. Musculoskeletal: Normal muscle tone without kyphosis Psych:  Responds to questions appropriately with a normal affect.    Assessment: 86 year old female with known coronary artery disease hypertension hyperlipidemia sarcoidosis chronic kidney disease stage III with progression of shortness of breath and chest discomfort possibly consistent with non-ST elevation myocardial infarction and no current evidence of  congestive heart failure  Plan: 1.  Heparin in fusion for 24 to 48 hours for non-ST elevation myocardial infarction with addition of aspirin 81 mg 2.  Continue to assess troponin elevation with additional troponin value 3.  Echocardiogram for LV systolic dysfunction valvular heart disease contributing to above 4.  Addition of isosorbide for treatment of anginal symptoms and suspected coronary artery disease 5.  Continuation of metoprolol and angiotensin receptor blocker for hypertension control myocardial infarction and anginal symptoms 6.  Begin ambulation and follow-up for improvements of symptoms and the potential for medication management for current condition rather than further interventional treatment  Signed, Lamar Blinks M.D. [facility] 11/18/2020, 7:02 AM

## 2020-11-19 DIAGNOSIS — I214 Non-ST elevation (NSTEMI) myocardial infarction: Principal | ICD-10-CM

## 2020-11-19 DIAGNOSIS — I6521 Occlusion and stenosis of right carotid artery: Secondary | ICD-10-CM | POA: Diagnosis not present

## 2020-11-19 DIAGNOSIS — N189 Chronic kidney disease, unspecified: Secondary | ICD-10-CM

## 2020-11-19 DIAGNOSIS — G4730 Sleep apnea, unspecified: Secondary | ICD-10-CM | POA: Diagnosis not present

## 2020-11-19 DIAGNOSIS — K5900 Constipation, unspecified: Secondary | ICD-10-CM

## 2020-11-19 DIAGNOSIS — N179 Acute kidney failure, unspecified: Secondary | ICD-10-CM

## 2020-11-19 DIAGNOSIS — I5032 Chronic diastolic (congestive) heart failure: Secondary | ICD-10-CM

## 2020-11-19 LAB — CBC
HCT: 31.2 % — ABNORMAL LOW (ref 36.0–46.0)
Hemoglobin: 11.1 g/dL — ABNORMAL LOW (ref 12.0–15.0)
MCH: 34.9 pg — ABNORMAL HIGH (ref 26.0–34.0)
MCHC: 35.6 g/dL (ref 30.0–36.0)
MCV: 98.1 fL (ref 80.0–100.0)
Platelets: 152 10*3/uL (ref 150–400)
RBC: 3.18 MIL/uL — ABNORMAL LOW (ref 3.87–5.11)
RDW: 13.4 % (ref 11.5–15.5)
WBC: 7.4 10*3/uL (ref 4.0–10.5)
nRBC: 0 % (ref 0.0–0.2)

## 2020-11-19 LAB — COMPREHENSIVE METABOLIC PANEL
ALT: <5 U/L (ref 0–44)
AST: 19 U/L (ref 15–41)
Albumin: 3.1 g/dL — ABNORMAL LOW (ref 3.5–5.0)
Alkaline Phosphatase: 45 U/L (ref 38–126)
Anion gap: 9 (ref 5–15)
BUN: 22 mg/dL (ref 8–23)
CO2: 25 mmol/L (ref 22–32)
Calcium: 8.6 mg/dL — ABNORMAL LOW (ref 8.9–10.3)
Chloride: 104 mmol/L (ref 98–111)
Creatinine, Ser: 1.22 mg/dL — ABNORMAL HIGH (ref 0.44–1.00)
GFR, Estimated: 41 mL/min — ABNORMAL LOW (ref >60)
Glucose, Bld: 102 mg/dL — ABNORMAL HIGH (ref 70–99)
Potassium: 3.8 mmol/L (ref 3.5–5.1)
Sodium: 138 mmol/L (ref 135–145)
Total Bilirubin: 1 mg/dL (ref 0.3–1.2)
Total Protein: 5.5 g/dL — ABNORMAL LOW (ref 6.5–8.1)

## 2020-11-19 LAB — HEPARIN LEVEL (UNFRACTIONATED): Heparin Unfractionated: 0.5 IU/mL (ref 0.30–0.70)

## 2020-11-19 MED ORDER — FUROSEMIDE 10 MG/ML IJ SOLN
40.0000 mg | Freq: Once | INTRAMUSCULAR | Status: AC
  Administered 2020-11-19: 40 mg via INTRAVENOUS
  Filled 2020-11-19: qty 4

## 2020-11-19 MED ORDER — FLEET ENEMA 7-19 GM/118ML RE ENEM
1.0000 | ENEMA | Freq: Once | RECTAL | Status: AC
  Administered 2020-11-19: 1 via RECTAL

## 2020-11-19 MED ORDER — CLOPIDOGREL BISULFATE 75 MG PO TABS
75.0000 mg | ORAL_TABLET | Freq: Every day | ORAL | Status: DC
  Administered 2020-11-19 – 2020-11-20 (×2): 75 mg via ORAL
  Filled 2020-11-19 (×2): qty 1

## 2020-11-19 MED ORDER — FLUTICASONE PROPIONATE 50 MCG/ACT NA SUSP
2.0000 | Freq: Every day | NASAL | Status: DC
  Filled 2020-11-19: qty 16

## 2020-11-19 MED ORDER — IPRATROPIUM-ALBUTEROL 0.5-2.5 (3) MG/3ML IN SOLN
3.0000 mL | Freq: Four times a day (QID) | RESPIRATORY_TRACT | Status: DC
  Administered 2020-11-19 – 2020-11-20 (×4): 3 mL via RESPIRATORY_TRACT
  Filled 2020-11-19 (×4): qty 3

## 2020-11-19 MED ORDER — IPRATROPIUM-ALBUTEROL 0.5-2.5 (3) MG/3ML IN SOLN: 3.0000 mL | RESPIRATORY_TRACT | Status: DC | PRN

## 2020-11-19 MED ORDER — MAGNESIUM HYDROXIDE 400 MG/5ML PO SUSP
30.0000 mL | Freq: Every day | ORAL | Status: DC
  Administered 2020-11-19: 30 mL via ORAL
  Filled 2020-11-19: qty 30

## 2020-11-19 NOTE — Progress Notes (Signed)
Pt stated she does not wear CPAP regularly and does not want to wear tonight. Pt aware CPAP can be made available at any time during the night for her. RN Diannia Ruder aware.

## 2020-11-19 NOTE — Progress Notes (Signed)
Ambulated patient in room to bathroom with walker. Tolerated well. Will ambulate patient in hallway after morning med pass

## 2020-11-19 NOTE — Progress Notes (Signed)
[facility] Cardiology [facility] Encounter Note  [name] / Admit Date: 11/17/2020 / Date of Encounter: 11/19/2020, 7:51 AM   Subjective: 11/21 the patient has had full resolution of her chest discomfort which was the main concern when she was admitted to the hospital.  This suggests that her non-ST elevation myocardial infarction has been treated medically.  In addition to that the patient now has had some significant wheezing and shortness of breath.  On exam there is significant amount of expiratory wheezing but no evidence of rails.  The patient's troponin did peak at 2071 with a BNP of 133 a glomerular filtration rate of 35.  We had discussed at length that at 86 years old it would be a preferable for medical management of her non-ST elevation myocardial infarction.  She did agree to this and we have successfully been able to improve her current condition.  Review of Systems: Positive for: Wheezing Negative for: Vision change, hearing change, syncope, dizziness, nausea, vomiting,diarrhea, bloody stool, stomach pain, cough, congestion, diaphoresis, urinary frequency, urinary pain,skin lesions, skin rashes Others previously listed  Objective: Telemetry: Normal sinus rhythm Physical Exam: Blood pressure 101/67, pulse 66, temperature 98.1 F (36.7 C), temperature source Oral, resp. rate 18, height 5\' 4"  (1.626 m), weight 95.2 kg, SpO2 99 %. Body mass index is 36.01 kg/m. General: Well developed, well nourished, in no acute distress. Head: Normocephalic, atraumatic, sclera non-icteric, no xanthomas, nares are without discharge. Neck: No apparent masses Lungs: Normal respirations with significant diffuse wheezes, no rhonchi, no rales , no crackles   Heart: Regular rate and rhythm, normal S1 S2, no murmur, no rub, no gallop, PMI is normal size and placement, carotid upstroke normal without bruit, jugular venous pressure normal Abdomen: Soft, non-tender, non-distended with  normoactive bowel sounds. No hepatosplenomegaly. Abdominal aorta is normal size without bruit Extremities: Trace edema, no clubbing, no cyanosis, no ulcers,  Peripheral: 2+ radial, 2+ femoral, 2+ dorsal pedal pulses Neuro: Alert and oriented. Moves all extremities spontaneously. Psych:  Responds to questions appropriately with a normal affect.   Intake/Output Summary (Last 24 hours) at 11/19/2020 0751 Last data filed at 11/19/2020 0323 Gross per 24 hour  Intake 2865.63 ml  Output --  Net 2865.63 ml    Inpatient Medications:  . aspirin EC  81 mg Oral Daily  . carbidopa-levodopa  1 tablet Oral QHS  . irbesartan  300 mg Oral Daily   And  . hydrochlorothiazide  12.5 mg Oral Daily  . isosorbide mononitrate  30 mg Oral Daily  . latanoprost  1 drop Both Eyes QHS  . metoprolol succinate  100 mg Oral Daily  . polyethylene glycol  17 g Oral Daily   Infusions:  . heparin 1,000 Units/hr (11/19/20 0323)  . lactated ringers 50 mL/hr at 11/19/20 0323    Labs: Recent Labs    11/18/20 0543 11/19/20 0532  NA 139 138  K 3.6 3.8  CL 105 104  CO2 24 25  GLUCOSE 103* 102*  BUN 17 22  CREATININE 0.99 1.22*  CALCIUM 8.6* 8.6*   Recent Labs    11/17/20 1343 11/19/20 0532  AST 16 19  ALT 8 <5  ALKPHOS 55 45  BILITOT 1.1 1.0  PROT 6.7 5.5*  ALBUMIN 3.7 3.1*   Recent Labs    11/17/20 1343 11/17/20 1343 11/18/20 0543 11/19/20 0532  WBC 7.2   < > 6.0 7.4  NEUTROABS 4.6  --   --   --   HGB 12.4   < >  11.9* 11.1*  HCT 36.1   < > 33.5* 31.2*  MCV 99.2   < > 97.4 98.1  PLT 177   < > 176 152   < > = values in this interval not displayed.   No results for input(s): CKTOTAL, CKMB, TROPONINI in the last 72 hours. Invalid input(s): POCBNP No results for input(s): HGBA1C in the last 72 hours.   Weights: Filed Weights   11/17/20 1409 11/17/20 1829 11/18/20 0500  Weight: 95.3 kg 94.2 kg 95.2 kg     Radiology/Studies:  DG Chest 1 View  Result Date: 11/17/2020 CLINICAL DATA:   [name].  Chest pain. EXAM: CHEST  1 VIEW COMPARISON:  One-view chest x-ray 07/08/2020 FINDINGS: The heart size is normal. Atherosclerotic changes are noted in the aorta. Lung volumes are low. No focal airspace disease is present. Degenerative changes are noted at the shoulders, right greater than left. IMPRESSION: 1. Low lung volumes. 2. No acute cardiopulmonary disease. Electronically Signed   By: Marin Roberts M.D.   On: 11/17/2020 14:05   CT Angio Chest PE W and/or Wo Contrast  Result Date: 11/17/2020 CLINICAL DATA:  86 year old female with history of positive D-dimer, leg swelling. EXAM: CT ANGIOGRAPHY CHEST WITH CONTRAST TECHNIQUE: Multidetector CT imaging of the chest was performed using the standard protocol during bolus administration of intravenous contrast. Multiplanar CT image reconstructions and MIPs were obtained to evaluate the vascular anatomy. CONTRAST:  Sixty mL Omnipaque 350, intravenous COMPARISON:  10/21/2017 FINDINGS: Cardiovascular: Satisfactory opacification of the pulmonary arteries to the segmental level. No evidence of pulmonary embolism. Normal caliber main pulmonary artery measuring up to 30 mm, unchanged. The RV to LV ratio is normal. Mild global cardiomegaly. No pericardial effusion. Unchanged mild ectasia of the ascending thoracic aorta. Scattered atherosclerotic calcifications of the descending thoracic aorta. Mild coronary atherosclerotic calcifications. Mediastinum/Nodes: No enlarged mediastinal, hilar, or axillary lymph nodes. Thyroid gland, trachea, and esophagus demonstrate no significant findings. Lungs/Pleura: Unchanged polygonal solid pulmonary nodule in the right upper lobe measuring up to 4 mm, favored represent a lymph node. No new worrisome pulmonary nodules. No focal consolidations, pleural effusion, or pneumothorax. Upper Abdomen: Moderate hiatal hernia similar to 2018 comparison without evidence of strangulation. The remaining visualized upper  abdomen is within normal limits. Musculoskeletal: No chest wall abnormality. No acute or significant osseous findings. Review of the MIP images confirms the above findings. IMPRESSION: Vascular: 1. No evidence of acute pulmonary embolism to the subsegmental level. 2.  Aortic Atherosclerosis (ICD10-I70.0). Non-Vascular: 1. Moderate hiatal hernia, unchanged. 2. No acute intrathoracic abnormality. Marliss Coots, MD [facility] Radiology Electronically Signed   By: Marliss Coots MD   On: 11/17/2020 15:56   US Venous Img Lower Bilateral  Result Date: 11/17/2020 CLINICAL DATA:  Bilateral leg pain and tightness EXAM: BILATERAL LOWER EXTREMITY VENOUS DOPPLER ULTRASOUND TECHNIQUE: Gray-scale sonography with graded compression, as well as color Doppler and duplex ultrasound were performed to evaluate the lower extremity deep venous systems from the level of the common femoral vein and including the common femoral, femoral, profunda femoral, popliteal and calf veins including the posterior tibial, peroneal and gastrocnemius veins when visible. The superficial great saphenous vein was also interrogated. Spectral Doppler was utilized to evaluate flow at rest and with distal augmentation maneuvers in the common femoral, femoral and popliteal veins. COMPARISON:  None. FINDINGS: RIGHT LOWER EXTREMITY Common Femoral Vein: No evidence of thrombus. Normal compressibility, respiratory phasicity and response to augmentation. Saphenofemoral Junction: No evidence of  thrombus. Normal compressibility and flow on color Doppler imaging. Profunda Femoral Vein: No evidence of thrombus. Normal compressibility and flow on color Doppler imaging. Femoral Vein: No evidence of thrombus. Normal compressibility, respiratory phasicity and response to augmentation. Popliteal Vein: No evidence of thrombus. Normal compressibility, respiratory phasicity and response to augmentation. Calf Veins: No  evidence of thrombus. Normal compressibility and flow on color Doppler imaging. LEFT LOWER EXTREMITY Common Femoral Vein: No evidence of thrombus. Normal compressibility, respiratory phasicity and response to augmentation. Saphenofemoral Junction: No evidence of thrombus. Normal compressibility and flow on color Doppler imaging. Profunda Femoral Vein: No evidence of thrombus. Normal compressibility and flow on color Doppler imaging. Femoral Vein: No evidence of thrombus. Normal compressibility, respiratory phasicity and response to augmentation. Popliteal Vein: No evidence of thrombus. Normal compressibility, respiratory phasicity and response to augmentation. Calf Veins: No evidence of thrombus. Normal compressibility and flow on color Doppler imaging. IMPRESSION: No evidence of deep venous thrombosis in either lower extremity. Electronically Signed   By: Judie Petit.  Shick M.D.   On: 11/17/2020 14:57     Assessment and Recommendation  86 y.o. female with known coronary artery disease status post remote coronary stenting hypertension hyperlipidemia sarcoidosis chronic kidney disease stage III sleep apnea with COPD having acute non-ST elevation myocardial infarction and chest pain now improved with medication management  Non-ST elevation myocardial infarction Patient has received 24 hours of heparin and would suggest dual antiplatelet therapy for non-ST elevation myocardial infarction in place of heparin at this time.  The patient has had resolution of chest pain with addition of isosorbide as well metoprolol will be continued for anginal symptoms LV dysfunction and myocardial infarction.  No further invasive procedures will be performed unless the patient has progression of symptoms untreatable with medication management  Hypertension Patient does have a continued hypertension although currently reasonably well treated with medication management including metoprolol isosorbide hydrochlorothiazide at this time.   Will consider the possibility of amlodipine for anginal symptoms as well as hypertension control if necessary  Hyperlipidemia Patient will have consideration of addition of statin therapy for high intensity cholesterol therapy and further risk reduction cardiovascular event in the future.  This will be reevaluated by her primary cardiologist as an outpatient due to the fact that the patient has had allergies and intolerance to the medications before  Cardiac rehabilitation Patient will have cardiac rehabilitation upon discharge to the hospital  Signed, Arnoldo Hooker M.D. FACC

## 2020-11-19 NOTE — Evaluation (Addendum)
Physical Therapy Evaluation Patient Details Name: Sheila Long MRN: [id number] DOB: [birth year] Today's Date: 11/19/2020   History of Present Illness  Pt is a 86 y/o F admitted on 11/17/20 after developing chest pain the day prior that was associated with SOB, also with c/o BLE pain & stiffness. Pt admitted for tx of possible NSTEMI. PMH: anxiety, aortic atherosclerosis, CHF, CAD (stent in 2000's), dyspnea, HOH, HTN, neuropathy, OSA, B knee OA, PVD, sarcoidosis, CKD stage 3  Clinical Impression  Prior to admission pt was mod I with ambulation with SPC, cooking & bathing without assistance with neighbors checking on her & doing laundry tasks. Pt is very pleasant & agreeable to PT evaluation on this date. Pt initially ambulates ~25 ft with SPC with CGA but frequently reaches for objects with other UE to hold to & steady herself. Pt agreeable to trialing RW & ambulates increased distances with RW & supervision with improved safety & balance with this AD. PT educates pt on need to use RW upon d/c instead of [facility] & pt is agreeable. Also educated pt on recommendation of 24 hr supervision at d/c with pt reporting she believes she has enough family/friends that this is possible (pt prefers a female to assist with bathing/dressing PRN). PT instructs pt on heel cord stretch with sheet but pt reports no stretch felt. Will continue to follow pt acutely to focus on balance & gait with LRAD. Pt would benefit from 24 hr supervision & HHPT f/u at d/c.  HR at rest = 77 bpm, HR after gait = 89 bpm, SPO2 = 98% on room air after gait     Follow Up Recommendations Home health PT;Supervision/Assistance - 24 hour;Supervision for mobility/OOB    Equipment Recommendations   (none - pt reports she already has DME at home)    Recommendations for Other Services       Precautions / Restrictions Precautions Precautions: Fall Restrictions Weight Bearing Restrictions: No      Mobility  Bed Mobility Overal bed  mobility: Modified Independent             General bed mobility comments: supine>sit with HOB elevated, pt reports "I have a harder time getting out of these beds than mine at home"    Transfers Overall transfer level: Modified independent   Transfers: Sit to/from Stand Sit to Stand: Modified independent (Device/Increase time)         General transfer comment: only instructional cuing for technique for sit>stand (push to stand vs pulling up on RW)  Ambulation/Gait Ambulation/Gait assistance: Supervision Gait Distance (Feet): 200 Feet Assistive device: Rolling walker (2 wheeled) Gait Pattern/deviations: Decreased step length - right;Decreased step length - left;Decreased stride length Gait velocity: decreased      Stairs            Wheelchair Mobility    Modified Rankin (Stroke Patients Only)       Balance Overall balance assessment: Mild deficits observed, not formally tested;Needs assistance Sitting-balance support: Feet supported Sitting balance-Leahy Scale: Good         Standing balance comment: Pt able to maintain static standing without BUE support with CGA<>close supervision but requires BUE support on AD for gait with supervision                             Pertinent Vitals/Pain Pain Assessment: No/denies pain (pt only c/o slight tightness in R calf but not sore when PT squeezes B calves; PT attempts  to show pt heel cord stretch with towel but pt reports no stretch noted when performing)    Home Living Family/patient expects to be discharged to:: Private residence Living Arrangements: Alone Available Help at Discharge: Friend(s);Family;Available PRN/intermittently (Pt reports she has been arranging for more assistance if she ever needs it & feels she can have friends/family provide 24 hr assist if necessary upon d/c.) Type of Home: House Home Access: Stairs to enter Entrance Stairs-Rails: Right Entrance Stairs-Number of Steps:  2 Home Layout: Multi-level;Laundry or work area in basement (pt doesn't need to access basement as her neighbors assist her with laundry) Home Equipment: Environmental consultant - 2 wheels;Walker - standard;Cane - single point (Reports she had a seat installed within shower/tub to allow her to sit.)      Prior Function Level of Independence: Independent with assistive device(s);Needs assistance   Gait / Transfers Assistance Needed: mod I gait with SPC, reports 1 fall 3-4 months ago when ambulating without SPC     Comments: Pt reports she cooks full meals for herself 2x/week to have for the following days. Driving PRN, ambulatory with SPC, neighbors check on her 3x/day, mod I with bathing/dressing, plays puzzles during the day after she wakes up at 10am, washes dishes herself     Hand Dominance        Extremity/Trunk Assessment   Upper Extremity Assessment Upper Extremity Assessment: Generalized weakness;Overall [facility] for tasks assessed    Lower Extremity Assessment Lower Extremity Assessment: Generalized weakness       Communication   Communication: HOH  Cognition Arousal/Alertness: Awake/alert Behavior During Therapy: WFL for tasks assessed/performed Overall Cognitive Status: Within Functional Limits for tasks assessed                                        General Comments      Exercises     Assessment/Plan    PT Assessment Patient needs continued PT services  PT Problem List Decreased strength;Decreased activity tolerance;Decreased balance;Cardiopulmonary status limiting activity;Decreased knowledge of use of DME;Pain;Decreased mobility       PT Treatment Interventions DME instruction;Therapeutic exercise;Balance training;Gait training;Stair training;Neuromuscular re-education;Functional mobility training;Therapeutic activities;Patient/family education    PT Goals (Current goals can be found in the Care Plan section)  Acute Rehab PT Goals Patient Stated Goal: to  go home PT Goal Formulation: With patient Time For Goal Achievement: 12/03/20 Potential to Achieve Goals: Good    Frequency Min 2X/week   Barriers to discharge Decreased caregiver support      Co-evaluation               AM-PAC PT "6 Clicks" Mobility  Outcome Measure Help needed turning from your back to your side while in a flat bed without using bedrails?: None Help needed moving from lying on your back to sitting on the side of a flat bed without using bedrails?: None Help needed moving to and from a bed to a chair (including a wheelchair)?: None Help needed standing up from a chair using your arms (e.g., wheelchair or bedside chair)?: None Help needed to walk in hospital room?: None Help needed climbing 3-5 steps with a railing? : A Little 6 Click Score: 23    End of Session Equipment Utilized During Treatment: Gait belt Activity Tolerance: Patient tolerated treatment well Patient left: in chair;with chair alarm set;with nursing/sitter in room Nurse Communication: Mobility status (notified NT of need for cord  to connect chair alarm to wall) PT Visit Diagnosis: Unsteadiness on feet (R26.81);Muscle weakness (generalized) (M62.81)    Time: 5732-2025 PT Time Calculation (min) (ACUTE ONLY): 25 min   Charges:   PT Evaluation $PT Eval Low Complexity: 1 Low PT Treatments $Therapeutic Activity: 8-22 mins        Aleda Grana, PT, DPT 11/19/20, 12:27 PM   Sandi Mariscal 11/19/2020, 12:23 PM

## 2020-11-19 NOTE — Progress Notes (Signed)
Patient ID: Sheila Long, female   DOB: [birth year], 86 y.o.   MRN: [id number] [facility] PROGRESS NOTE  Lavren Lewan IFO:[id number] DOB: 05/20/1924 DOA: 11/17/2020 PCP: [name], MD  HPI/Subjective: Patient this morning was complaining of wheezing.  She states she was wheezing prior to even coming into the hospital.  When she coughs it is clear.  States she uses a whole box of tissues.  Upon reevaluation this afternoon she was feeling a little bit woozy.  Objective: Vitals:   11/19/20 0837 11/19/20 1208  BP: 127/70 124/74  Pulse: 67 78  Resp: 18 18  Temp: 98.2 F (36.8 C) 98.1 F (36.7 C)  SpO2: 99% 98%    Intake/Output Summary (Last 24 hours) at 11/19/2020 1333 Last data filed at 11/19/2020 0323 Gross per 24 hour  Intake 2625.63 ml  Output --  Net 2625.63 ml   Filed Weights   11/17/20 1409 11/17/20 1829 11/18/20 0500  Weight: 95.3 kg 94.2 kg 95.2 kg    ROS: Review of Systems  Respiratory: Positive for shortness of breath and wheezing. Negative for cough.   Cardiovascular: Negative for chest pain.  Gastrointestinal: Positive for constipation. Negative for abdominal pain, nausea and vomiting.   Exam: Physical Exam HENT:     Head: Normocephalic.     Mouth/Throat:     Pharynx: No oropharyngeal exudate.  Eyes:     General: Lids are normal.     Conjunctiva/sclera: Conjunctivae normal.     Pupils: Pupils are equal, round, and reactive to light.  Cardiovascular:     Rate and Rhythm: Normal rate and regular rhythm.     Heart sounds: Normal heart sounds, S1 normal and S2 normal.  Pulmonary:     Breath sounds: Examination of the right-lower field reveals decreased breath sounds and wheezing. Examination of the left-lower field reveals decreased breath sounds and wheezing. Decreased breath sounds and wheezing present. No rhonchi or rales.  Abdominal:     Palpations: Abdomen is soft.     Tenderness: There is no abdominal tenderness.   Musculoskeletal:     Right lower leg: Swelling present.     Left lower leg: Swelling present.  Skin:    General: Skin is warm.     Findings: No rash.  Neurological:     Mental Status: She is alert and oriented to person, place, and time.       Data Reviewed: Basic Metabolic Panel: Recent Labs  Lab 11/17/20 1343 11/18/20 0543 11/19/20 0532  NA 137 139 138  K 3.9 3.6 3.8  CL 103 105 104  CO2 26 24 25   GLUCOSE 118* 103* 102*  BUN 21 17 22   CREATININE 1.40* 0.99 1.22*  CALCIUM 8.7* 8.6* 8.6*   Liver Function Tests: Recent Labs  Lab 11/17/20 1343 11/19/20 0532  AST 16 19  ALT 8 <5  ALKPHOS 55 45  BILITOT 1.1 1.0  PROT 6.7 5.5*  ALBUMIN 3.7 3.1*    CBC: Recent Labs  Lab 11/17/20 1343 11/18/20 0543 11/19/20 0532  WBC 7.2 6.0 7.4  NEUTROABS 4.6  --   --   HGB 12.4 11.9* 11.1*  HCT 36.1 33.5* 31.2*  MCV 99.2 97.4 98.1  PLT 177 176 152   BNP (last 3 results) Recent Labs    11/17/20 1343  BNP 133.0*    Recent Results (from the past 240 hour(s))  Resp Panel by RT-PCR (Flu A&B, Covid) Nasopharyngeal Swab     Status: None   Collection Time: 11/17/20  1:43 PM  Specimen: Nasopharyngeal Swab; Nasopharyngeal(NP) swabs in vial transport medium  Result Value Ref Range Status   SARS Coronavirus 2 by RT PCR NEGATIVE NEGATIVE Final    Comment: (NOTE) SARS-CoV-2 target nucleic acids are NOT DETECTED.  The SARS-CoV-2 RNA is generally detectable in upper respiratory specimens during the acute phase of infection. The lowest concentration of SARS-CoV-2 viral copies this assay can detect is 138 copies/mL. A negative result does not preclude SARS-Cov-2 infection and should not be used as the sole basis for treatment or other patient management decisions. A negative result may occur with  improper specimen collection/handling, submission of specimen other than nasopharyngeal swab, presence of viral mutation(s) within the areas targeted by this assay, and inadequate  number of viral copies(<138 copies/mL). A negative result must be combined with clinical observations, patient history, and epidemiological information. The expected result is Negative.  Fact Sheet for Patients:  BloggerCourse.com  Fact Sheet for Healthcare Providers:  SeriousBroker.it  This test is no t yet approved or cleared by the [place] FDA and  has been authorized for detection and/or diagnosis of SARS-CoV-2 by FDA under an Emergency Use Authorization (EUA). This EUA will remain  in effect (meaning this test can be used) for the duration of the COVID-19 declaration under Section 564(b)(1) of the Act, 21 U.S.C.section 360bbb-3(b)(1), unless the authorization is terminated  or revoked sooner.       Influenza A by PCR NEGATIVE NEGATIVE Final   Influenza B by PCR NEGATIVE NEGATIVE Final    Comment: (NOTE) The Xpert Xpress SARS-CoV-2/FLU/RSV plus assay is intended as an aid in the diagnosis of influenza from Nasopharyngeal swab specimens and should not be used as a sole basis for treatment. Nasal washings and aspirates are unacceptable for Xpert Xpress SARS-CoV-2/FLU/RSV testing.  Fact Sheet for Patients: BloggerCourse.com  Fact Sheet for Healthcare Providers: SeriousBroker.it  This test is not yet approved or cleared by the [place] FDA and has been authorized for detection and/or diagnosis of SARS-CoV-2 by FDA under an Emergency Use Authorization (EUA). This EUA will remain in effect (meaning this test can be used) for the duration of the COVID-19 declaration under Section 564(b)(1) of the Act, 21 U.S.C. section 360bbb-3(b)(1), unless the authorization is terminated or revoked.  Performed at [facility], [address] [place], [place] [address]      Studies: CT Angio Chest PE W and/or Wo Contrast  Result Date: 11/17/2020 CLINICAL DATA:   86 year old female with history of positive D-dimer, leg swelling. EXAM: CT ANGIOGRAPHY CHEST WITH CONTRAST TECHNIQUE: Multidetector CT imaging of the chest was performed using the standard protocol during bolus administration of intravenous contrast. Multiplanar CT image reconstructions and MIPs were obtained to evaluate the vascular anatomy. CONTRAST:  Sixty mL Omnipaque 350, intravenous COMPARISON:  10/21/2017 FINDINGS: Cardiovascular: Satisfactory opacification of the pulmonary arteries to the segmental level. No evidence of pulmonary embolism. Normal caliber main pulmonary artery measuring up to 30 mm, unchanged. The RV to LV ratio is normal. Mild global cardiomegaly. No pericardial effusion. Unchanged mild ectasia of the ascending thoracic aorta. Scattered atherosclerotic calcifications of the descending thoracic aorta. Mild coronary atherosclerotic calcifications. Mediastinum/Nodes: No enlarged mediastinal, hilar, or axillary lymph nodes. Thyroid gland, trachea, and esophagus demonstrate no significant findings. Lungs/Pleura: Unchanged polygonal solid pulmonary nodule in the right upper lobe measuring up to 4 mm, favored represent a lymph node. No new worrisome pulmonary nodules. No focal consolidations, pleural effusion, or pneumothorax. Upper Abdomen: Moderate hiatal hernia similar to 2018 comparison without evidence  of strangulation. The remaining visualized upper abdomen is within normal limits. Musculoskeletal: No chest wall abnormality. No acute or significant osseous findings. Review of the MIP images confirms the above findings. IMPRESSION: Vascular: 1. No evidence of acute pulmonary embolism to the subsegmental level. 2.  Aortic Atherosclerosis (ICD10-I70.0). Non-Vascular: 1. Moderate hiatal hernia, unchanged. 2. No acute intrathoracic abnormality. Marliss Coots, MD [facility] Electronically Signed   By: Marliss Coots MD   On: 11/17/2020  15:56   US Venous Img Lower Bilateral  Result Date: 11/17/2020 CLINICAL DATA:  Bilateral leg pain and tightness EXAM: BILATERAL LOWER EXTREMITY VENOUS DOPPLER ULTRASOUND TECHNIQUE: Gray-scale sonography with graded compression, as well as color Doppler and duplex ultrasound were performed to evaluate the lower extremity deep venous systems from the level of the common femoral vein and including the common femoral, femoral, profunda femoral, popliteal and calf veins including the posterior tibial, peroneal and gastrocnemius veins when visible. The superficial great saphenous vein was also interrogated. Spectral Doppler was utilized to evaluate flow at rest and with distal augmentation maneuvers in the common femoral, femoral and popliteal veins. COMPARISON:  None. FINDINGS: RIGHT LOWER EXTREMITY Common Femoral Vein: No evidence of thrombus. Normal compressibility, respiratory phasicity and response to augmentation. Saphenofemoral Junction: No evidence of thrombus. Normal compressibility and flow on color Doppler imaging. Profunda Femoral Vein: No evidence of thrombus. Normal compressibility and flow on color Doppler imaging. Femoral Vein: No evidence of thrombus. Normal compressibility, respiratory phasicity and response to augmentation. Popliteal Vein: No evidence of thrombus. Normal compressibility, respiratory phasicity and response to augmentation. Calf Veins: No evidence of thrombus. Normal compressibility and flow on color Doppler imaging. LEFT LOWER EXTREMITY Common Femoral Vein: No evidence of thrombus. Normal compressibility, respiratory phasicity and response to augmentation. Saphenofemoral Junction: No evidence of thrombus. Normal compressibility and flow on color Doppler imaging. Profunda Femoral Vein: No evidence of thrombus. Normal compressibility and flow on color Doppler imaging. Femoral Vein: No evidence of thrombus. Normal compressibility, respiratory phasicity and response to augmentation.  Popliteal Vein: No evidence of thrombus. Normal compressibility, respiratory phasicity and response to augmentation. Calf Veins: No evidence of thrombus. Normal compressibility and flow on color Doppler imaging. IMPRESSION: No evidence of deep venous thrombosis in either lower extremity. Electronically Signed   By: Judie Petit.  Shick M.D.   On: 11/17/2020 14:57    Scheduled Meds: . aspirin EC  81 mg Oral Daily  . carbidopa-levodopa  1 tablet Oral QHS  . clopidogrel  75 mg Oral Daily  . ipratropium-albuterol  3 mL Nebulization Q6H  . irbesartan  300 mg Oral Daily  . isosorbide mononitrate  30 mg Oral Daily  . latanoprost  1 drop Both Eyes QHS  . metoprolol succinate  100 mg Oral Daily  . polyethylene glycol  17 g Oral Daily  . sodium phosphate  1 enema Rectal Once   Continuous Infusions:  Assessment/Plan:  1. NSTEMI.  Medical management.  Heparin drip stopped.  Continue Toprol-XL.  Continue Imdur, aspirin and Plavix. 2. Right carotid stenosis.  On aspirin and Plavix. 3. Obstructive sleep apnea on CPAP at night.  Will order. 4. Acute kidney injury on chronic kidney disease stage III.  Continue to monitor closely.  Check a BMP tomorrow afternoon. 5. Constipation.  Patient requests a Fleet enema. 6. Wheeze this morning.  Gave 1 dose of Lasix.  Lungs sound better this afternoon but patient felt woozy.  We will watch and reevaluate again tomorrow. 7. Chronic diastolic congestive heart  failure with moderate mitral regurgitation seen on prior echo.  Give 1 dose of Lasix this morning.  Reevaluate tomorrow. 8. Postnasal drip trial of Flonase.        Code Status:     Code Status Orders  (From admission, onward)         Start     Ordered   11/17/20 1852  Full code  Continuous        11/17/20 1854        Code Status History    Date Active Date Inactive Code Status Order ID Comments User Context   06/23/2017 1530 06/25/2017 2012 Full Code [id number]  Donato HeinzHooten, James P, MD Inpatient   Advance  Care Planning Activity     Family Communication: Niece at the bedside Disposition Plan: Status is: Inpatient  Dispo: The patient is from: Home              Anticipated d/c is to: Home              Anticipated d/c date is: 11/20/2020              Patient currently given a dose of Lasix here for her chronic diastolic congestive heart failure since she was receiving IV fluids and heparin during the hospital course.  Patient was feeling a little woozy this afternoon so I will reevaluate for potential disposition tomorrow.  Time spent: 28 minutes  Irelyn Perfecto [employer]  [facility]

## 2020-11-19 NOTE — Progress Notes (Signed)
ANTICOAGULATION CONSULT NOTE   Pharmacy Consult for heparin infusion Indication: chest pain/ACS/STEMI  Patient Measurements: Height: 5\' 4"  (162.6 cm) Weight: 95.2 kg (209 lb 12.8 oz) IBW/kg (Calculated) : 54.7 Heparin Dosing Weight: 76.5 kg  Vital Signs: Temp: 98.1 F (36.7 C) (11/21 0507) Temp Source: Oral (11/21 0507) BP: 101/67 (11/21 0507) Pulse Rate: 66 (11/21 0507)  Labs: Recent Labs    11/17/20 1343 11/17/20 1343 11/17/20 1446 11/17/20 1526 11/17/20 2219 11/18/20 0013 11/18/20 0543 11/19/20 0532  HGB 12.4   < >  --   --   --   --  11.9* 11.1*  HCT 36.1  --   --   --   --   --  33.5* 31.2*  PLT 177  --   --   --   --   --  176 152  APTT  --   --  28  --   --   --   --   --   LABPROT  --   --  12.2  --   --   --   --   --   INR  --   --  0.9  --   --   --   --   --   HEPARINUNFRC  --   --   --   --  0.50  --  0.53 0.50  CREATININE 1.40*  --   --   --   --   --  0.99  --   TROPONINIHS 317*   < >  --  899* 1,742* 2,071*  --   --    < > = values in this interval not displayed.    Estimated Creatinine Clearance: 38.9 mL/min (by C-G formula based on SCr of 0.99 mg/dL).   Medical History: Past Medical History:  Diagnosis Date  . Allergic rhinitis due to pollen   . Anxiety   . Aortic atherosclerosis (HCC)   . CHF (congestive heart failure) (HCC)   . Coronary artery disease    Stent in 2000's (DC)  . Dyspnea   . GERD (gastroesophageal reflux disease)   . Hard of hearing   . Hypertension   . Neuropathy   . Obstructive sleep apnea   . Osteoarthritis of both knees   . Peripheral vascular disease (HCC)   . Sarcoidosis   . Thyroid nodule      Assessment: 86 yo female with PMH of CHF, CAD, HTN presenting with leg swelling & numbness. Home med hx incomplete at this time.  Past med hx shows HTN meds, but no indication of anticoags.  1st HS troponin 317, Fibrin D-dimer 1117, H&H, Plts WNL.  INR and aPTT ordered and pending results.   Goal of Therapy:  Heparin  level 0.3-0.7 units/ml Monitor platelets by anticoagulation protocol: Yes   Plan:  11/19:  HL @ 2219 = 0.5 Will continue pt on current rate and recheck HL 6 hrs after rate change.   11/20: HL @ 0543 = 0.53 Will continue pt on current rate and recheck HL on 11/21 with AM labs.   11/21: HL @ 0532 = 0.5 Will continue pt on current rate and recheck HL on 11/22 with AM labs.   Cheng Dec D 11/19/2020 6:24 AM

## 2020-11-20 DIAGNOSIS — G4733 Obstructive sleep apnea (adult) (pediatric): Secondary | ICD-10-CM

## 2020-11-20 DIAGNOSIS — I5033 Acute on chronic diastolic (congestive) heart failure: Secondary | ICD-10-CM | POA: Diagnosis not present

## 2020-11-20 DIAGNOSIS — I214 Non-ST elevation (NSTEMI) myocardial infarction: Secondary | ICD-10-CM | POA: Diagnosis not present

## 2020-11-20 DIAGNOSIS — N179 Acute kidney failure, unspecified: Secondary | ICD-10-CM | POA: Diagnosis not present

## 2020-11-20 DIAGNOSIS — R0982 Postnasal drip: Secondary | ICD-10-CM

## 2020-11-20 LAB — BASIC METABOLIC PANEL
Anion gap: 13 (ref 5–15)
BUN: 24 mg/dL — ABNORMAL HIGH (ref 8–23)
CO2: 25 mmol/L (ref 22–32)
Calcium: 8.6 mg/dL — ABNORMAL LOW (ref 8.9–10.3)
Chloride: 100 mmol/L (ref 98–111)
Creatinine, Ser: 1.2 mg/dL — ABNORMAL HIGH (ref 0.44–1.00)
GFR, Estimated: 42 mL/min — ABNORMAL LOW (ref >60)
Glucose, Bld: 126 mg/dL — ABNORMAL HIGH (ref 70–99)
Potassium: 3.7 mmol/L (ref 3.5–5.1)
Sodium: 138 mmol/L (ref 135–145)

## 2020-11-20 MED ORDER — FLUTICASONE PROPIONATE 50 MCG/ACT NA SUSP
2.0000 | Freq: Every day | NASAL | 0 refills | Status: DC
Start: 2020-11-20 — End: 2022-06-06

## 2020-11-20 MED ORDER — ALBUTEROL SULFATE HFA 108 (90 BASE) MCG/ACT IN AERS: 2.0000 | INHALATION_SPRAY | Freq: Four times a day (QID) | RESPIRATORY_TRACT | 0 refills | Status: DC | PRN

## 2020-11-20 MED ORDER — IRBESARTAN 75 MG PO TABS
75.0000 mg | ORAL_TABLET | Freq: Every day | ORAL | 0 refills | Status: DC
Start: 2020-11-20 — End: 2022-04-23

## 2020-11-20 MED ORDER — ISOSORBIDE MONONITRATE ER 30 MG PO TB24
30.0000 mg | ORAL_TABLET | Freq: Every day | ORAL | 0 refills | Status: DC
Start: 2020-11-20 — End: 2022-04-23

## 2020-11-20 MED ORDER — CLOPIDOGREL BISULFATE 75 MG PO TABS
75.0000 mg | ORAL_TABLET | Freq: Every day | ORAL | 0 refills | Status: DC
Start: 2020-11-20 — End: 2022-04-23

## 2020-11-20 MED ORDER — POLYETHYLENE GLYCOL 3350 17 G PO PACK: 17.0000 g | PACK | Freq: Every day | ORAL | 0 refills | Status: DC | PRN

## 2020-11-20 MED ORDER — NITROGLYCERIN 0.4 MG SL SUBL: 0.4000 mg | SUBLINGUAL_TABLET | SUBLINGUAL | 0 refills | Status: DC | PRN

## 2020-11-20 MED ORDER — IRBESARTAN 75 MG PO TABS
75.0000 mg | ORAL_TABLET | Freq: Every day | ORAL | Status: DC
  Administered 2020-11-20: 75 mg via ORAL
  Filled 2020-11-20: qty 1

## 2020-11-20 MED ORDER — FUROSEMIDE 20 MG PO TABS: 20.0000 mg | ORAL_TABLET | Freq: Every day | ORAL | 0 refills | Status: DC

## 2020-11-20 NOTE — Discharge Summary (Signed)
[facility]   PATIENT NAME: Sheila Long    MR#:  [id number]  DATE OF BIRTH:  08-03-1924  DATE OF ADMISSION:  11/17/2020 ADMITTING PHYSICIAN: Rhetta Mura, MD  DATE OF DISCHARGE: 11/20/2020 12:39 PM  PRIMARY CARE PHYSICIAN: [name], MD    ADMISSION DIAGNOSIS:  [name] [R06.02] Elevated troponin [R77.8] Chest pain, unspecified type [R07.9] NSTEMI (non-ST elevated myocardial infarction) (HCC) [I21.4]  DISCHARGE DIAGNOSIS:  Principal Problem:   [name] Active Problems:   Sarcoidosis   Hypertension   Coronary artery disease   GERD (gastroesophageal reflux disease)   Peripheral vascular disease (HCC)   Osteoarthritis of both knees   Observed sleep apnea   Chronic diastolic CHF (congestive heart failure) (HCC)   Anxiety   Constipation   Dyslipidemia   NSTEMI (non-ST elevated myocardial infarction) (HCC)   Stenosis of right carotid artery   Acute kidney injury superimposed on CKD (HCC)   SECONDARY DIAGNOSIS:   Past Medical History:  Diagnosis Date  . Allergic rhinitis due to pollen   . Anxiety   . Aortic atherosclerosis (HCC)   . CHF (congestive heart failure) (HCC)   . Coronary artery disease    Stent in 2000's (DC)  . Dyspnea   . GERD (gastroesophageal reflux disease)   . Hard of hearing   . Hypertension   . Neuropathy   . Obstructive sleep apnea   . Osteoarthritis of both knees   . Peripheral vascular disease (HCC)   . Sarcoidosis   . Thyroid nodule     HOSPITAL COURSE:   1.  NSTEMI.  Patient was seen in consultation by Dr. Liborio Nixon cardiology and recommended medical management.  The patient was on heparin drip for 2 days.  Continue Toprol XL, Avapro, Imdur, aspirin, Plavix.  The patient has an intolerance to statins. 2.  Acute kidney injury on chronic kidney disease stage IIIb.  I needed to give a dose of IV Lasix while here in the hospital.  Creatinine was stable over the  last 2 days.  Creatinine 1.2 upon discharge home.  Creatinine was as high as 1.40 during the hospital course. 3.  Acute on chronic diastolic congestive heart failure with moderate mitral regurgitation seen on prior outpatient echocardiogram.  The patient was given IV fluids and heparin drip while in the hospital.  I gave 1 dose of IV Lasix on 11/19/2020.  Lungs are clear today.  We will give oral Lasix upon going home. 4.  Postnasal drip.  Trial of Flonase 5.  Obstructive sleep apnea on CPAP at night. 6.  Constipation.  MiraLAX as needed at home 7.  Wheeze on 11/19/2020.  No wheeze today.  Continue albuterol inhaler as needed.  Likely from fluid overload.  DISCHARGE CONDITIONS:   Satisfactory  CONSULTS OBTAINED:  Cardiology  DRUG ALLERGIES:   Allergies  Allergen Reactions  . Tramadol Other (See Comments)    CONFUSION  . Sulfa Antibiotics     Patient does not recall  . Zocor [Simvastatin] Other (See Comments)    INTOLERANCE-MYALGIAS     DISCHARGE MEDICATIONS:   Allergies as of 11/20/2020      Reactions   Tramadol Other (See Comments)   CONFUSION   Sulfa Antibiotics    Patient does not recall   Zocor [simvastatin] Other (See Comments)   INTOLERANCE-MYALGIAS      Medication List    STOP taking these medications   olmesartan-hydrochlorothiazide 40-12.5 MG tablet Commonly known as: BENICAR HCT  TAKE these medications   albuterol 108 (90 Base) MCG/ACT inhaler Commonly known as: VENTOLIN HFA Inhale 2 puffs into the lungs every 6 (six) hours as needed for wheezing or shortness of breath.   aspirin EC 81 MG tablet Take 81 mg by mouth daily.   carbidopa-levodopa 25-100 MG tablet Commonly known as: SINEMET IR Take 1 tablet by mouth at bedtime.   clopidogrel 75 MG tablet Commonly known as: PLAVIX Take 1 tablet (75 mg total) by mouth daily.   fluticasone 50 MCG/ACT nasal spray Commonly known as: FLONASE Place 2 sprays into both nostrils daily.   furosemide 20  MG tablet Commonly known as: Lasix Take 1 tablet (20 mg total) by mouth daily.   irbesartan 75 MG tablet Commonly known as: AVAPRO Take 1 tablet (75 mg total) by mouth daily.   isosorbide mononitrate 30 MG 24 hr tablet Commonly known as: IMDUR Take 1 tablet (30 mg total) by mouth daily.   latanoprost 0.005 % ophthalmic solution Commonly known as: XALATAN Place 1 drop into both eyes at bedtime.   metoprolol succinate 100 MG 24 hr tablet Commonly known as: TOPROL-XL Take 100 mg by mouth daily.   nitroGLYCERIN 0.4 MG SL tablet Commonly known as: NITROSTAT Place 1 tablet (0.4 mg total) under the tongue every 5 (five) minutes as needed for chest pain.   polyethylene glycol 17 g packet Commonly known as: MIRALAX / GLYCOLAX Take 17 g by mouth daily as needed for severe constipation.        DISCHARGE INSTRUCTIONS:   Follow-up PMD 5 days Follow-up cardiology 1 week  If you experience worsening of your admission symptoms, develop shortness of breath, life threatening emergency, suicidal or homicidal thoughts you must seek medical attention immediately by calling 911 or calling your MD immediately  if symptoms less severe.  You Must read complete instructions/literature along with all the possible adverse reactions/side effects for all the Medicines you take and that have been prescribed to you. Take any new Medicines after you have completely understood and accept all the possible adverse reactions/side effects.   Please note  You were cared for by a hospitalist during your hospital stay. If you have any questions about your discharge medications or the care you received while you were in the hospital after you are discharged, you can call the unit and asked to speak with the hospitalist on call if the hospitalist that took care of you is not available. Once you are discharged, your primary care physician will handle any further medical issues. Please note that NO REFILLS for any  discharge medications will be authorized once you are discharged, as it is imperative that you return to your primary care physician (or establish a relationship with a primary care physician if you do not have one) for your aftercare needs so that they can reassess your need for medications and monitor your lab values.    Today   CHIEF COMPLAINT:   Chief Complaint  Patient presents with  . Leg Swelling    HISTORY OF PRESENT ILLNESS:  Elicia LampKathelene Yacoub  is a 86 y.o. female came in initially with leg swelling and found to have an NSTEMI.   VITAL SIGNS:  Blood pressure (!) 139/59, pulse 95, temperature (!) 97.4 F (36.3 C), temperature source Oral, resp. rate 17, height 5\' 4"  (1.626 m), weight 96.8 kg, SpO2 99 %.    PHYSICAL EXAMINATION:  GENERAL:  86 y.o.-year-old patient lying in the bed with no acute distress.  EYES:  Pupils equal, round, reactive to light and accommodation. No scleral icterus. HEENT: Head atraumatic, normocephalic. Oropharynx and nasopharynx clear.  LUNGS: Normal breath sounds bilaterally, no wheezing, rales,rhonchi or crepitation. No use of accessory muscles of respiration.  CARDIOVASCULAR: S1, S2 normal. No murmurs, rubs, or gallops.  ABDOMEN: Soft, non-tender, non-distended. Bowel sounds present. No organomegaly or mass.  EXTREMITIES: No pedal edema.  NEUROLOGIC: Cranial nerves II through XII are intact. Muscle strength 5/5 in all extremities. Sensation intact. Gait not checked.  PSYCHIATRIC: The patient is alert and oriented x 3.  SKIN: No obvious rash, lesion, or ulcer.   DATA REVIEW:   CBC Recent Labs  Lab 11/19/20 0532  WBC 7.4  HGB 11.1*  HCT 31.2*  PLT 152    Chemistries  Recent Labs  Lab 11/19/20 0532 11/19/20 0532 11/20/20 0437  NA 138   < > 138  K 3.8   < > 3.7  CL 104   < > 100  CO2 25   < > 25  GLUCOSE 102*   < > 126*  BUN 22   < > 24*  CREATININE 1.22*   < > 1.20*  CALCIUM 8.6*   < > 8.6*  AST 19  --   --   ALT <5  --    --   ALKPHOS 45  --   --   BILITOT 1.0  --   --    < > = values in this interval not displayed.    Microbiology Results  Results for orders placed or performed during the hospital encounter of 11/17/20  Resp Panel by RT-PCR (Flu A&B, Covid) Nasopharyngeal Swab     Status: None   Collection Time: 11/17/20  1:43 PM   Specimen: Nasopharyngeal Swab; Nasopharyngeal(NP) swabs in vial transport medium  Result Value Ref Range Status   SARS Coronavirus 2 by RT PCR NEGATIVE NEGATIVE Final    Comment: (NOTE) SARS-CoV-2 target nucleic acids are NOT DETECTED.  The SARS-CoV-2 RNA is generally detectable in upper respiratory specimens during the acute phase of infection. The lowest concentration of SARS-CoV-2 viral copies this assay can detect is 138 copies/mL. A negative result does not preclude SARS-Cov-2 infection and should not be used as the sole basis for treatment or other patient management decisions. A negative result may occur with  improper specimen collection/handling, submission of specimen other than nasopharyngeal swab, presence of viral mutation(s) within the areas targeted by this assay, and inadequate number of viral copies(<138 copies/mL). A negative result must be combined with clinical observations, patient history, and epidemiological information. The expected result is Negative.  Fact Sheet for Patients:  BloggerCourse.com  Fact Sheet for Healthcare Providers:  SeriousBroker.it  This test is no t yet approved or cleared by the [place] FDA and  has been authorized for detection and/or diagnosis of SARS-CoV-2 by FDA under an Emergency Use Authorization (EUA). This EUA will remain  in effect (meaning this test can be used) for the duration of the COVID-19 declaration under Section 564(b)(1) of the Act, 21 U.S.C.section 360bbb-3(b)(1), unless the authorization is terminated  or revoked sooner.       Influenza A by  PCR NEGATIVE NEGATIVE Final   Influenza B by PCR NEGATIVE NEGATIVE Final    Comment: (NOTE) The Xpert Xpress SARS-CoV-2/FLU/RSV plus assay is intended as an aid in the diagnosis of influenza from Nasopharyngeal swab specimens and should not be used as a sole basis for treatment. Nasal washings and aspirates are unacceptable for Xpert Xpress  SARS-CoV-2/FLU/RSV testing.  Fact Sheet for Patients: BloggerCourse.com  Fact Sheet for Healthcare Providers: SeriousBroker.it  This test is not yet approved or cleared by the [place] FDA and has been authorized for detection and/or diagnosis of SARS-CoV-2 by FDA under an Emergency Use Authorization (EUA). This EUA will remain in effect (meaning this test can be used) for the duration of the COVID-19 declaration under Section 564(b)(1) of the Act, 21 U.S.C. section 360bbb-3(b)(1), unless the authorization is terminated or revoked.  Performed at [facility], [address], [place], [place] [address]      Management plans discussed with the patient, and she is in agreement.  Deferred me calling family.  CODE STATUS:     Code Status Orders  (From admission, onward)         Start     Ordered   11/17/20 1852  Full code  Continuous        11/17/20 1854        Code Status History    Date Active Date Inactive Code Status Order ID Comments User Context   06/23/2017 1530 06/25/2017 2012 Full Code [id number]  Donato Heinz, MD Inpatient   Advance Care Planning Activity      TOTAL TIME TAKING CARE OF THIS PATIENT: 35 minutes.    Alford Highland M.D on 11/20/2020 at 5:08 PM  Between 7am to 6pm - Pager - [phone]  After 6pm go to [email or web address] - password [email or web address]  [facility]  CC: Primary care physician; [name], MD

## 2020-11-20 NOTE — Progress Notes (Signed)
[facility] Encounter Note  [name] / Admit Date: 11/17/2020 / Date of Encounter: 11/20/2020, 7:22 AM   Subjective: 11/21 the patient has had full resolution of her chest discomfort which was the main concern when she was admitted to the hospital.  This suggests that her non-ST elevation myocardial infarction has been treated medically.  In addition to that the patient now has had some significant wheezing and shortness of breath.  On exam there is significant amount of expiratory wheezing but no evidence of rails.  The patient's troponin did peak at 2071 with a BNP of 133 a glomerular filtration rate of 35.  We had discussed at length that at 86 years old it would be a preferable for medical management of her non-ST elevation myocardial infarction.  She did agree to this and we have successfully been able to improve her current condition.  11/22 patient has had significant improvements in symptoms overall.  She feels much better today than previous days.  Her wheezing is significantly improved from yesterday and is moving much better air.  The patient has not had any evidence of cardiac chest discomfort although with coughing yesterday she did have some episodes of mild amount of chest discomfort.  The patient has ambulated to the chair with no further evidence of issues.  Her peak troponin was 2071 consistent with non-ST elevation myocardial infarction and she has had appropriate increase in medication management for anginal symptoms which appears to have worked.  There is slight amount of dizziness which may be consistent with her lower blood pressure and medication management at this time and will assess for changes and need for adjustments with ambulation today  Review of Systems: Positive for: None Negative for: Vision change, hearing change, syncope, dizziness, nausea, vomiting,diarrhea, bloody stool, stomach pain, cough, congestion, diaphoresis,  urinary frequency, urinary pain,skin lesions, skin rashes Others previously listed  Objective: Telemetry: Normal sinus rhythm Physical Exam: Blood pressure (!) 111/46, pulse 85, temperature 98.3 F (36.8 C), resp. rate 17, height 5\' 4"  (1.626 m), weight 96.8 kg, SpO2 93 %. Body mass index is 36.65 kg/m. General: Well developed, well nourished, in no acute distress. Head: Normocephalic, atraumatic, sclera non-icteric, no xanthomas, nares are without discharge. Neck: No apparent masses Lungs: Normal respirations with significant slight wheezes, no rhonchi, no rales , no crackles   Heart: Regular rate and rhythm, normal S1 S2, no murmur, no rub, no gallop, PMI is normal size and placement, carotid upstroke normal without bruit, jugular venous pressure normal Abdomen: Soft, non-tender, non-distended with normoactive bowel sounds. No hepatosplenomegaly. Abdominal aorta is normal size without bruit Extremities: Trace edema, no clubbing, no cyanosis, no ulcers,  Peripheral: 2+ radial, 2+ femoral, 2+ dorsal pedal pulses Neuro: Alert and oriented. Moves all extremities spontaneously. Psych:  Responds to questions appropriately with a normal affect.   Intake/Output Summary (Last 24 hours) at 11/20/2020 11/22/2020 Last data filed at 11/19/2020 1900 Gross per 24 hour  Intake 720 ml  Output 500 ml  Net 220 ml    Inpatient Medications:  . aspirin EC  81 mg Oral Daily  . carbidopa-levodopa  1 tablet Oral QHS  . clopidogrel  75 mg Oral Daily  . fluticasone  2 spray Each Nare Daily  . ipratropium-albuterol  3 mL Nebulization Q6H  . irbesartan  300 mg Oral Daily  . isosorbide mononitrate  30 mg Oral Daily  . latanoprost  1 drop Both Eyes QHS  . magnesium hydroxide  30 mL Oral  QHS  . metoprolol succinate  100 mg Oral Daily  . polyethylene glycol  17 g Oral Daily   Infusions:    Labs: Recent Labs    11/19/20 0532 11/20/20 0437  NA 138 138  K 3.8 3.7  CL 104 100  CO2 25 25  GLUCOSE 102*  126*  BUN 22 24*  CREATININE 1.22* 1.20*  CALCIUM 8.6* 8.6*   Recent Labs    11/17/20 1343 11/19/20 0532  AST 16 19  ALT 8 <5  ALKPHOS 55 45  BILITOT 1.1 1.0  PROT 6.7 5.5*  ALBUMIN 3.7 3.1*   Recent Labs    11/17/20 1343 11/17/20 1343 11/18/20 0543 11/19/20 0532  WBC 7.2   < > 6.0 7.4  NEUTROABS 4.6  --   --   --   HGB 12.4   < > 11.9* 11.1*  HCT 36.1   < > 33.5* 31.2*  MCV 99.2   < > 97.4 98.1  PLT 177   < > 176 152   < > = values in this interval not displayed.   No results for input(s): CKTOTAL, CKMB, TROPONINI in the last 72 hours. Invalid input(s): POCBNP No results for input(s): HGBA1C in the last 72 hours.   Weights: Filed Weights   11/17/20 1829 11/18/20 0500 11/20/20 0444  Weight: 94.2 kg 95.2 kg 96.8 kg     Radiology/Studies:  DG Chest 1 View  Result Date: 11/17/2020 CLINICAL DATA:  [name].  Chest pain. EXAM: CHEST  1 VIEW COMPARISON:  One-view chest x-ray 07/08/2020 FINDINGS: The heart size is normal. Atherosclerotic changes are noted in the aorta. Lung volumes are low. No focal airspace disease is present. Degenerative changes are noted at the shoulders, right greater than left. IMPRESSION: 1. Low lung volumes. 2. No acute cardiopulmonary disease. Electronically Signed   By: Marin Robertshristopher  Mattern M.D.   On: 11/17/2020 14:05   CT Angio Chest PE W and/or Wo Contrast  Result Date: 11/17/2020 CLINICAL DATA:  86 year old female with history of positive D-dimer, leg swelling. EXAM: CT ANGIOGRAPHY CHEST WITH CONTRAST TECHNIQUE: Multidetector CT imaging of the chest was performed using the standard protocol during bolus administration of intravenous contrast. Multiplanar CT image reconstructions and MIPs were obtained to evaluate the vascular anatomy. CONTRAST:  Sixty mL Omnipaque 350, intravenous COMPARISON:  10/21/2017 FINDINGS: Cardiovascular: Satisfactory opacification of the pulmonary arteries to the segmental level. No evidence of pulmonary  embolism. Normal caliber main pulmonary artery measuring up to 30 mm, unchanged. The RV to LV ratio is normal. Mild global cardiomegaly. No pericardial effusion. Unchanged mild ectasia of the ascending thoracic aorta. Scattered atherosclerotic calcifications of the descending thoracic aorta. Mild coronary atherosclerotic calcifications. Mediastinum/Nodes: No enlarged mediastinal, hilar, or axillary lymph nodes. Thyroid gland, trachea, and esophagus demonstrate no significant findings. Lungs/Pleura: Unchanged polygonal solid pulmonary nodule in the right upper lobe measuring up to 4 mm, favored represent a lymph node. No new worrisome pulmonary nodules. No focal consolidations, pleural effusion, or pneumothorax. Upper Abdomen: Moderate hiatal hernia similar to 2018 comparison without evidence of strangulation. The remaining visualized upper abdomen is within normal limits. Musculoskeletal: No chest wall abnormality. No acute or significant osseous findings. Review of the MIP images confirms the above findings. IMPRESSION: Vascular: 1. No evidence of acute pulmonary embolism to the subsegmental level. 2.  Aortic Atherosclerosis (ICD10-I70.0). Non-Vascular: 1. Moderate hiatal hernia, unchanged. 2. No acute intrathoracic abnormality. Marliss Cootsylan Suttle, MD [facility] Electronically Signed   By: Domingo Dimesylan  Suttle MD   On: 11/17/2020 15:56   US Venous Img Lower Bilateral  Result Date: 11/17/2020 CLINICAL DATA:  Bilateral leg pain and tightness EXAM: BILATERAL LOWER EXTREMITY VENOUS DOPPLER ULTRASOUND TECHNIQUE: Gray-scale sonography with graded compression, as well as color Doppler and duplex ultrasound were performed to evaluate the lower extremity deep venous systems from the level of the common femoral vein and including the common femoral, femoral, profunda femoral, popliteal and calf veins including the posterior tibial, peroneal and gastrocnemius veins when  visible. The superficial great saphenous vein was also interrogated. Spectral Doppler was utilized to evaluate flow at rest and with distal augmentation maneuvers in the common femoral, femoral and popliteal veins. COMPARISON:  None. FINDINGS: RIGHT LOWER EXTREMITY Common Femoral Vein: No evidence of thrombus. Normal compressibility, respiratory phasicity and response to augmentation. Saphenofemoral Junction: No evidence of thrombus. Normal compressibility and flow on color Doppler imaging. Profunda Femoral Vein: No evidence of thrombus. Normal compressibility and flow on color Doppler imaging. Femoral Vein: No evidence of thrombus. Normal compressibility, respiratory phasicity and response to augmentation. Popliteal Vein: No evidence of thrombus. Normal compressibility, respiratory phasicity and response to augmentation. Calf Veins: No evidence of thrombus. Normal compressibility and flow on color Doppler imaging. LEFT LOWER EXTREMITY Common Femoral Vein: No evidence of thrombus. Normal compressibility, respiratory phasicity and response to augmentation. Saphenofemoral Junction: No evidence of thrombus. Normal compressibility and flow on color Doppler imaging. Profunda Femoral Vein: No evidence of thrombus. Normal compressibility and flow on color Doppler imaging. Femoral Vein: No evidence of thrombus. Normal compressibility, respiratory phasicity and response to augmentation. Popliteal Vein: No evidence of thrombus. Normal compressibility, respiratory phasicity and response to augmentation. Calf Veins: No evidence of thrombus. Normal compressibility and flow on color Doppler imaging. IMPRESSION: No evidence of deep venous thrombosis in either lower extremity. Electronically Signed   By: Judie Petit.  Shick M.D.   On: 11/17/2020 14:57     Assessment and Recommendation  86 y.o. female with known coronary artery disease status post remote coronary stenting hypertension hyperlipidemia sarcoidosis chronic kidney disease  stage III sleep apnea with COPD having acute non-ST elevation myocardial infarction and chest pain now improved with medication management  Non-ST elevation myocardial infarction Patient has received 24 hours of heparin and would suggest dual antiplatelet therapy for non-ST elevation myocardial infarction in place of heparin at this time.  The patient has had resolution of chest pain with addition of isosorbide as well metoprolol will be continued for anginal symptoms LV dysfunction and myocardial infarction.  No further invasive procedures will be performed unless the patient has progression of symptoms untreatable with medication management  Hypertension Patient does have a continued hypertension although currently reasonably well treated with medication management including metoprolol isosorbide hydrochlorothiazide at this time.  Will consider the possibility of amlodipine for anginal symptoms as well as hypertension control if necessary.  Although, currently her blood pressure is well maintained and there is concerns of dizziness  Hyperlipidemia Patient will have consideration of addition of statin therapy for high intensity cholesterol therapy and further risk reduction cardiovascular event in the future.  This will be reevaluated by her primary cardiologist as an outpatient due to the fact that the patient has had allergies and intolerance to the medications before  Cardiac rehabilitation Patient will have cardiac rehabilitation upon discharge to the hospital  Plan 1.  Continue current medical management for acute non-ST elevation myocardial infarction and progressive chest pain including dual antiplatelet therapy including aspirin and Plavix 2.  No further  cardiac diagnostics and/or invasive procedures due to advanced age and significantly high risk of complication 3.  Begin ambulation and follow for improvements of symptoms and if patient ambulating well with isosorbide with no further chest  pain and hypertension control with metoprolol and irbesartan would consider the possibility of discharge to home with cardiac rehabilitation and follow-up with him in 1 week. 4.  Dr. Juliann Pares will be taking over care Signed, Arnoldo Hooker M.D. FACC

## 2020-11-27 ENCOUNTER — Inpatient Hospital Stay: Payer: PPO | Admitting: Internal Medicine

## 2020-11-29 DIAGNOSIS — I739 Peripheral vascular disease, unspecified: Secondary | ICD-10-CM | POA: Diagnosis not present

## 2020-11-29 DIAGNOSIS — D860 Sarcoidosis of lung: Secondary | ICD-10-CM | POA: Diagnosis not present

## 2020-11-29 DIAGNOSIS — I10 Essential (primary) hypertension: Secondary | ICD-10-CM | POA: Diagnosis not present

## 2020-11-29 DIAGNOSIS — D869 Sarcoidosis, unspecified: Secondary | ICD-10-CM | POA: Diagnosis not present

## 2020-11-29 DIAGNOSIS — E785 Hyperlipidemia, unspecified: Secondary | ICD-10-CM | POA: Diagnosis not present

## 2020-11-29 DIAGNOSIS — I2510 Atherosclerotic heart disease of native coronary artery without angina pectoris: Secondary | ICD-10-CM | POA: Diagnosis not present

## 2020-11-29 DIAGNOSIS — R0602 Shortness of breath: Secondary | ICD-10-CM | POA: Diagnosis not present

## 2020-11-29 DIAGNOSIS — Z23 Encounter for immunization: Secondary | ICD-10-CM | POA: Diagnosis not present

## 2020-11-29 DIAGNOSIS — I2729 Other secondary pulmonary hypertension: Secondary | ICD-10-CM | POA: Diagnosis not present

## 2020-11-29 DIAGNOSIS — I5032 Chronic diastolic (congestive) heart failure: Secondary | ICD-10-CM | POA: Diagnosis not present

## 2020-11-30 ENCOUNTER — Encounter: Payer: Self-pay | Admitting: Internal Medicine

## 2020-11-30 ENCOUNTER — Ambulatory Visit (INDEPENDENT_AMBULATORY_CARE_PROVIDER_SITE_OTHER): Payer: PPO | Admitting: Internal Medicine

## 2020-11-30 ENCOUNTER — Other Ambulatory Visit: Payer: Self-pay

## 2020-11-30 VITALS — BP 146/75 | HR 79 | Ht 64.0 in | Wt 212.5 lb

## 2020-11-30 DIAGNOSIS — E538 Deficiency of other specified B group vitamins: Secondary | ICD-10-CM | POA: Diagnosis not present

## 2020-11-30 DIAGNOSIS — I5033 Acute on chronic diastolic (congestive) heart failure: Secondary | ICD-10-CM | POA: Diagnosis not present

## 2020-11-30 DIAGNOSIS — K219 Gastro-esophageal reflux disease without esophagitis: Secondary | ICD-10-CM | POA: Diagnosis not present

## 2020-11-30 DIAGNOSIS — I10 Essential (primary) hypertension: Secondary | ICD-10-CM | POA: Diagnosis not present

## 2020-11-30 DIAGNOSIS — G4733 Obstructive sleep apnea (adult) (pediatric): Secondary | ICD-10-CM | POA: Diagnosis not present

## 2020-11-30 MED ORDER — CYANOCOBALAMIN 1000 MCG/ML IJ SOLN
1000.0000 ug | Freq: Once | INTRAMUSCULAR | Status: AC
  Administered 2020-11-30: 1000 ug via INTRAMUSCULAR

## 2020-11-30 NOTE — Progress Notes (Signed)
Established Patient Office Visit  Subjective:  Patient ID: Sheila Long, female    DOB: December 30, 1924  Age: 86 y.o. MRN: [id number]  CC:  Chief Complaint  Patient presents with  . Hospitalization Follow-up    patient was admitted from 11/19 to 11/22    HPI     Patient was treated in the hospital with a heart failure.  She was advised to weigh himself herself daily.  And follow low-salt diet.  If she gained some more weight more than 2.10 she should call and inform the physician office.  Patient main problem include shortness of breath has a history of hypertension with sarcoidosis coronary artery disease reflux problem and osteoarthritis of the both knee she also has a sleep apnea and anxiety neurosis.   Treated in the hospital for diastolic congestive heart failure with moderate mitral regurgitation documented by echocardiogram.  She received Lasix for diastolic i congestive heart failure patient has also postnasal drip and obstructive sleep apnea on CPAP at night.  Constipation is being managed.  MiraLAX.  On today's examination she was not found to be wheezing.  Sheila Long presents for bp  Check  , c/o dizziness  Past Medical History:  Diagnosis Date  . Allergic rhinitis due to pollen   . Anxiety   . Aortic atherosclerosis (HCC)   . CHF (congestive heart failure) (HCC)   . Coronary artery disease    Stent in 2000's (DC)  . Dyspnea   . GERD (gastroesophageal reflux disease)   . Hard of hearing   . Hypertension   . Neuropathy   . Obstructive sleep apnea   . Osteoarthritis of both knees   . Peripheral vascular disease (HCC)   . Sarcoidosis   . Thyroid nodule     Past Surgical History:  Procedure Laterality Date  . ABDOMINAL HYSTERECTOMY    . BIOPSY THYROID  2014   Dr Andee Poles  . BREAST EXCISIONAL BIOPSY Left 1980s   surgical bx neg  . BREAST EXCISIONAL BIOPSY Left 1980s   benign  . CARDIOVASCULAR STRESS TEST  05/12/2012   with dobutamine---negative.  Normal LV function  . CORONARY ANGIOPLASTY  2006  . ESOPHAGOGASTRODUODENOSCOPY (EGD) WITH PROPOFOL N/A 04/03/2020   Procedure: ESOPHAGOGASTRODUODENOSCOPY (EGD) WITH PROPOFOL;  Surgeon: [name], MD;  Location: [facility] ENDOSCOPY;  Service: Gastroenterology;  Laterality: N/A;  . JOINT REPLACEMENT Left 2000   hip  . JOINT REPLACEMENT Right 2007   hip  . KNEE ARTHROPLASTY Right 06/23/2017   Procedure: COMPUTER ASSISTED TOTAL KNEE ARTHROPLASTY;  Surgeon: Donato Heinz, MD;  Location: [facility] ORS;  Service: Orthopedics;  Laterality: Right;    Family History  Problem Relation Age of Onset  . Cancer Sister 13       unknown - groin area  . Heart attack Brother   . Cancer Sister 62       pancreatic   . Heart attack Sister   . Heart attack Brother     Social History   Socioeconomic History  . Marital status: Widowed    Spouse name: Not on file  . Number of children: 0  . Years of education: Not on file  . Highest education level: Not on file  Occupational History  . 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 Smoking status: Never Smoker  . Smokeless tobacco: Never Used  Vaping Use  . Vaping Use: Never used  Substance and Sexual Activity  . Alcohol use: No  .  Drug use: No  . Sexual activity: Not Currently    Birth control/protection: Post-menopausal  Other Topics Concern  . Not on file  Social History Narrative   Past living will   Needs to redo health care POA   Requests sister Sheila Long as decision makers   Would accept resuscitation   Not sure about tube feeds   Social Determinants of Health   Financial Resource Strain:   . Difficulty of Paying Living Expenses: Not on file  Food Insecurity:   . Worried About Programme researcher, broadcasting/film/video in the Last Year: Not on file  . Ran Out of Food in the Last Year: Not on file  Transportation Needs:   . Lack of Transportation (Medical): Not on file  . Lack of Transportation (Non-Medical): Not on file   Physical Activity:   . Days of Exercise per Week: Not on file  . Minutes of Exercise per Session: Not on file  Stress:   . Feeling of Stress : Not on file  Social Connections:   . Frequency of Communication with Friends and Family: Not on file  . Frequency of Social Gatherings with Friends and Family: Not on file  . Attends Religious Services: Not on file  . Active Member of Clubs or Organizations: Not on file  . Attends Banker Meetings: Not on file  . Marital Status: Not on file  Intimate Partner Violence:   . Fear of Current or Ex-Partner: Not on file  . Emotionally Abused: Not on file  . Physically Abused: Not on file  . Sexually Abused: Not on file     Current Outpatient Medications:  .  albuterol (VENTOLIN HFA) 108 (90 Base) MCG/ACT inhaler, Inhale 2 puffs into the lungs every 6 (six) hours as needed for wheezing or shortness of breath., Disp: 18 g, Rfl: 0 .  aspirin EC 81 MG tablet, Take 81 mg by mouth daily. , Disp: , Rfl:  .  carbidopa-levodopa (SINEMET IR) 25-100 MG tablet, Take 1 tablet by mouth at bedtime. , Disp: , Rfl:  .  clopidogrel (PLAVIX) 75 MG tablet, Take 1 tablet (75 mg total) by mouth daily., Disp: 30 tablet, Rfl: 0 .  fluticasone (FLONASE) 50 MCG/ACT nasal spray, Place 2 sprays into both nostrils daily., Disp: 16 g, Rfl: 0 .  furosemide (LASIX) 20 MG tablet, Take 1 tablet (20 mg total) by mouth daily., Disp: 30 tablet, Rfl: 0 .  irbesartan (AVAPRO) 75 MG tablet, Take 1 tablet (75 mg total) by mouth daily., Disp: 30 tablet, Rfl: 0 .  isosorbide mononitrate (IMDUR) 30 MG 24 hr tablet, Take 1 tablet (30 mg total) by mouth daily., Disp: 30 tablet, Rfl: 0 .  latanoprost (XALATAN) 0.005 % ophthalmic solution, Place 1 drop into both eyes at bedtime., Disp: , Rfl: 5 .  metoprolol succinate (TOPROL-XL) 100 MG 24 hr tablet, Take 100 mg by mouth daily., Disp: , Rfl:  .  nitroGLYCERIN (NITROSTAT) 0.4 MG SL tablet, Place 1 tablet (0.4 mg total) under the  tongue every 5 (five) minutes as needed for chest pain., Disp: 30 tablet, Rfl: 0 .  polyethylene glycol (MIRALAX / GLYCOLAX) 17 g packet, Take 17 g by mouth daily as needed for severe constipation., Disp: 30 each, Rfl: 0   Allergies  Allergen Reactions  . Tramadol Other (See Comments)    CONFUSION  . Sulfa Antibiotics     Patient does not recall  . Zocor [Simvastatin] Other (See Comments)    INTOLERANCE-MYALGIAS  ROS Review of Systems  Constitutional: Negative.   HENT: Negative.   Eyes: Negative.   Respiratory: Negative.   Cardiovascular: Negative.   Gastrointestinal: Negative.   Endocrine: Negative.   Genitourinary: Negative.   Musculoskeletal: Negative.   Skin: Negative.   Allergic/Immunologic: Negative.   Neurological: Negative.   Hematological: Negative.   Psychiatric/Behavioral: Negative.   All other systems reviewed and are negative.     Objective:    Physical Exam Vitals reviewed.  Constitutional:      Appearance: Normal appearance.  HENT:     Mouth/Throat:     Mouth: Mucous membranes are moist.  Eyes:     Pupils: Pupils are equal, round, and reactive to light.  Neck:     Vascular: No carotid bruit.  Cardiovascular:     Rate and Rhythm: Normal rate and regular rhythm.     Pulses: Normal pulses.     Heart sounds: Normal heart sounds.  Pulmonary:     Effort: Pulmonary effort is normal.     Breath sounds: Normal breath sounds.  Abdominal:     General: Bowel sounds are normal.     Palpations: Abdomen is soft. There is no hepatomegaly, splenomegaly or mass.     Tenderness: There is no abdominal tenderness.     Hernia: No hernia is present.  Musculoskeletal:        General: No tenderness.     Cervical back: Neck supple.     Right lower leg: No edema.     Left lower leg: No edema.  Skin:    Findings: No rash.  Neurological:     Mental Status: She is alert and oriented to person, place, and time.     Motor: No weakness.  Psychiatric:        Mood  and Affect: Mood and affect normal.        Behavior: Behavior normal.     BP (!) 146/75   Pulse 79   Ht 5\' 4"  (1.626 m)   Wt 212 lb 8 oz (96.4 kg)   BMI 36.48 kg/m  Wt Readings from Last 3 Encounters:  11/30/20 212 lb 8 oz (96.4 kg)  11/20/20 213 lb 8 oz (96.8 kg)  07/08/20 210 lb (95.3 kg)     Health Maintenance Due  Topic Date Due  . TETANUS/TDAP  Never done  . DEXA SCAN  Never done  . PNA vac Low Risk Adult (2 of 2 - PPSV23) 09/24/2017  . INFLUENZA VACCINE  Never done    There are no preventive care reminders to display for this patient.  Lab Results  Component Value Date   TSH 1.674 11/17/2020   Lab Results  Component Value Date   WBC 7.4 11/19/2020   HGB 11.1 (L) 11/19/2020   HCT 31.2 (L) 11/19/2020   MCV 98.1 11/19/2020   PLT 152 11/19/2020   Lab Results  Component Value Date   NA 138 11/20/2020   K 3.7 11/20/2020   CO2 25 11/20/2020   GLUCOSE 126 (H) 11/20/2020   BUN 24 (H) 11/20/2020   CREATININE 1.20 (H) 11/20/2020   BILITOT 1.0 11/19/2020   ALKPHOS 45 11/19/2020   AST 19 11/19/2020   ALT <5 11/19/2020   PROT 5.5 (L) 11/19/2020   ALBUMIN 3.1 (L) 11/19/2020   CALCIUM 8.6 (L) 11/20/2020   ANIONGAP 13 11/20/2020   GFR 61.85 12/24/2016   No results found for: CHOL No results found for: HDL No results found for: LDLCALC No results found for:  TRIG No results found for: CHOLHDL Lab Results  Component Value Date   HGBA1C 6.4 10/30/2017      Assessment & Plan:   Problem List Items Addressed This Visit      Cardiovascular and Mediastinum   Hypertension    Hypertension is stable on present medication.  Today her blood pressure will 146/75.  She was advised to follow low-salt diet.  And continue taking her medications which were started her in the hospital      Acute on chronic diastolic CHF (congestive heart failure) (HCC)    On today's examination patient was not wheezing.  Her chest does not sound any different than before.  Few rhonchi  were noted in the right upper chest.  There is 1+ pedal edema.  And she was able to walk without any shortness of breath.        Respiratory   Obstructive sleep apnea    She is using CPAP regularly.        Digestive   GERD (gastroesophageal reflux disease)    - The patient's GERD is stable on medication.  - Instructed the patient to avoid eating spicy and acidic foods, as well as foods high in fat. - Instructed the patient to avoid eating large meals or meals 2-3 hours prior to sleeping.        Other Visit Diagnoses    B12 deficiency    -  Primary   Relevant Medications   cyanocobalamin ((VITAMIN B-12)) injection 1,000 mcg (Completed)      Meds ordered this encounter  Medications  . cyanocobalamin ((VITAMIN B-12)) injection 1,000 mcg    Follow-up: No follow-ups on file.    Corky DownsJaved Monty Spicher, MD

## 2020-11-30 NOTE — Assessment & Plan Note (Signed)
Hypertension is stable on present medication.  Today her blood pressure will 146/75.  She was advised to follow low-salt diet.  And continue taking her medications which were started her in the hospital

## 2020-11-30 NOTE — Assessment & Plan Note (Signed)
-   The patient's GERD is stable on medication.  - Instructed the patient to avoid eating spicy and acidic foods, as well as foods high in fat. - Instructed the patient to avoid eating large meals or meals 2-3 hours prior to sleeping. 

## 2020-11-30 NOTE — Assessment & Plan Note (Signed)
On today's examination patient was not wheezing.  Her chest does not sound any different than before.  Few rhonchi were noted in the right upper chest.  There is 1+ pedal edema.  And she was able to walk without any shortness of breath.

## 2020-11-30 NOTE — Assessment & Plan Note (Signed)
She is using CPAP regularly.

## 2020-12-11 DIAGNOSIS — M5489 Other dorsalgia: Secondary | ICD-10-CM | POA: Diagnosis not present

## 2020-12-11 DIAGNOSIS — G5600 Carpal tunnel syndrome, unspecified upper limb: Secondary | ICD-10-CM | POA: Insufficient documentation

## 2020-12-11 DIAGNOSIS — R200 Anesthesia of skin: Secondary | ICD-10-CM | POA: Diagnosis not present

## 2020-12-11 DIAGNOSIS — G629 Polyneuropathy, unspecified: Secondary | ICD-10-CM | POA: Diagnosis not present

## 2020-12-21 ENCOUNTER — Other Ambulatory Visit (INDEPENDENT_AMBULATORY_CARE_PROVIDER_SITE_OTHER): Payer: Self-pay | Admitting: Nurse Practitioner

## 2020-12-21 DIAGNOSIS — I739 Peripheral vascular disease, unspecified: Secondary | ICD-10-CM

## 2020-12-26 ENCOUNTER — Other Ambulatory Visit: Payer: Self-pay

## 2020-12-26 ENCOUNTER — Encounter (INDEPENDENT_AMBULATORY_CARE_PROVIDER_SITE_OTHER): Payer: Self-pay | Admitting: Nurse Practitioner

## 2020-12-26 ENCOUNTER — Ambulatory Visit (INDEPENDENT_AMBULATORY_CARE_PROVIDER_SITE_OTHER): Payer: PPO

## 2020-12-26 ENCOUNTER — Ambulatory Visit (INDEPENDENT_AMBULATORY_CARE_PROVIDER_SITE_OTHER): Payer: PPO | Admitting: Nurse Practitioner

## 2020-12-26 VITALS — BP 141/84 | HR 76 | Resp 16 | Ht 64.0 in | Wt 217.0 lb

## 2020-12-26 DIAGNOSIS — I10 Essential (primary) hypertension: Secondary | ICD-10-CM

## 2020-12-26 DIAGNOSIS — M170 Bilateral primary osteoarthritis of knee: Secondary | ICD-10-CM

## 2020-12-26 DIAGNOSIS — I739 Peripheral vascular disease, unspecified: Secondary | ICD-10-CM | POA: Diagnosis not present

## 2020-12-31 ENCOUNTER — Encounter (INDEPENDENT_AMBULATORY_CARE_PROVIDER_SITE_OTHER): Payer: Self-pay | Admitting: Nurse Practitioner

## 2021-01-01 ENCOUNTER — Ambulatory Visit: Payer: PPO | Admitting: Internal Medicine

## 2021-01-08 ENCOUNTER — Encounter: Payer: PPO | Attending: Cardiology

## 2021-01-08 ENCOUNTER — Other Ambulatory Visit: Payer: Self-pay

## 2021-01-08 DIAGNOSIS — I214 Non-ST elevation (NSTEMI) myocardial infarction: Secondary | ICD-10-CM

## 2021-01-08 NOTE — Progress Notes (Signed)
Subjective:    Patient ID: Sheila Long, female    DOB: 07/03/1924, 87 y.o.   MRN: [id number] Chief Complaint  Patient presents with  . New Patient (Initial Visit)    Ref Malvin JohnsPotter PVD    The patient returns to the office for followup and review of the noninvasive studies. There have been no interval changes in lower extremity symptoms. No interval shortening of the patient's claudication distance or development of rest pain symptoms. No new ulcers or wounds have occurred since the last visit.  There have been no significant changes to the patient's overall health care.  The patient denies amaurosis fugax or recent TIA symptoms. There are no recent neurological changes noted. The patient denies history of DVT, PE or superficial thrombophlebitis. The patient denies recent episodes of angina or shortness of breath.   ABI Rt=0.71 and Lt=0.54  (previous ABI's Rt=1.06 and Lt=0.71) Duplex ultrasound of the right tibial arteries reveals triphasic/biphasic waveforms whereas the left has monophasic waveforms.  The patient has good toe waveforms on the right digit with slightly dampened on the left.   Review of Systems  Musculoskeletal: Positive for arthralgias.  Skin: Negative for wound.  All other systems reviewed and are negative.      Objective:   Physical Exam Vitals reviewed.  HENT:     Head: Normocephalic.  Cardiovascular:     Rate and Rhythm: Normal rate.     Pulses: Decreased pulses.  Pulmonary:     Effort: Pulmonary effort is normal.  Skin:    General: Skin is warm and dry.  Neurological:     Mental Status: She is alert and oriented to person, place, and time.  Psychiatric:        Mood and Affect: Mood normal.        Behavior: Behavior normal.        Thought Content: Thought content normal.        Judgment: Judgment normal.     BP (!) 141/84 (BP Location: Right Arm)   Pulse 76   Resp 16   Ht 5\' 4"  (1.626 m)   Wt 217 lb (98.4 kg)   BMI 37.25 kg/m   Past  Medical History:  Diagnosis Date  . Allergic rhinitis due to pollen   . Anxiety   . Aortic atherosclerosis (HCC)   . CHF (congestive heart failure) (HCC)   . Coronary artery disease    Stent in 2000's (DC)  . Dyspnea   . GERD (gastroesophageal reflux disease)   . Hard of hearing   . Hypertension   . Neuropathy   . Obstructive sleep apnea   . Osteoarthritis of both knees   . Peripheral vascular disease (HCC)   . Sarcoidosis   . Thyroid nodule     Social History   Socioeconomic History  . Marital status: Widowed    Spouse name: Not on file  . Number of children: 0  . Years of education: Not on file  . Highest education level: Not on file  Occupational History  . 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 Smoking status: Never Smoker  . Smokeless tobacco: Never Used  Vaping Use  . Vaping Use: Never used  Substance and Sexual Activity  . Alcohol use: No  . Drug use: No  . Sexual activity: Not Currently    Birth control/protection: Post-menopausal  Other Topics Concern  . Not on file  Social History Narrative   Past living  will   Needs to redo health care POA   Requests sister [employer] as decision makers   Would accept resuscitation   Not sure about tube feeds   Social Determinants of Health   Financial Resource Strain: Not on file  Food Insecurity: Not on file  Transportation Needs: Not on file  Physical Activity: Not on file  Stress: Not on file  Social Connections: Not on file  Intimate Partner Violence: Not on file    Past Surgical History:  Procedure Laterality Date  . ABDOMINAL HYSTERECTOMY    . BIOPSY THYROID  2014   Dr Andee Poles  . BREAST EXCISIONAL BIOPSY Left 1980s   surgical bx neg  . BREAST EXCISIONAL BIOPSY Left 1980s   benign  . CARDIOVASCULAR STRESS TEST  05/12/2012   with dobutamine---negative. Normal LV function  . CORONARY ANGIOPLASTY  2006  . ESOPHAGOGASTRODUODENOSCOPY (EGD) WITH PROPOFOL N/A  04/03/2020   Procedure: ESOPHAGOGASTRODUODENOSCOPY (EGD) WITH PROPOFOL;  Surgeon: [name], MD;  Location: [facility] ENDOSCOPY;  Service: Gastroenterology;  Laterality: N/A;  . JOINT REPLACEMENT Left 2000   hip  . JOINT REPLACEMENT Right 2007   hip  . KNEE ARTHROPLASTY Right 06/23/2017   Procedure: COMPUTER ASSISTED TOTAL KNEE ARTHROPLASTY;  Surgeon: Donato Heinz, MD;  Location: [facility] ORS;  Service: Orthopedics;  Laterality: Right;    Family History  Problem Relation Age of Onset  . Cancer Sister 39       unknown - groin area  . Heart attack Brother   . Cancer Sister 9       pancreatic   . Heart attack Sister   . Heart attack Brother     Allergies  Allergen Reactions  . Tramadol Other (See Comments)    CONFUSION  . Sulfa Antibiotics     Patient does not recall  . Zocor [Simvastatin] Other (See Comments)    INTOLERANCE-MYALGIAS     CBC Latest Ref Rng & Units 11/19/2020 11/18/2020 11/17/2020  WBC 4.0 - 10.5 K/uL 7.4 6.0 7.2  Hemoglobin 12.0 - 15.0 g/dL 11.1(L) 11.9(L) 12.4  Hematocrit 36.0 - 46.0 % 31.2(L) 33.5(L) 36.1  Platelets 150 - 400 K/uL 152 176 177      CMP     Component Value Date/Time   NA 138 11/20/2020 0437   NA 141 03/23/2015 0842   K 3.7 11/20/2020 0437   K 4.0 03/23/2015 0842   CL 100 11/20/2020 0437   CL 107 03/23/2015 0842   CO2 25 11/20/2020 0437   CO2 26 03/23/2015 0842   GLUCOSE 126 (H) 11/20/2020 0437   GLUCOSE 126 (H) 03/23/2015 0842   BUN 24 (H) 11/20/2020 0437   BUN 17 03/23/2015 0842   CREATININE 1.20 (H) 11/20/2020 0437   CREATININE 1.05 (H) 03/23/2015 0842   CALCIUM 8.6 (L) 11/20/2020 0437   CALCIUM 9.2 03/23/2015 0842   PROT 5.5 (L) 11/19/2020 0532   PROT 7.1 03/23/2015 0842   ALBUMIN 3.1 (L) 11/19/2020 0532   ALBUMIN 4.2 03/23/2015 0842   AST 19 11/19/2020 0532   AST 26 03/23/2015 0842   ALT <5 11/19/2020 0532   ALT 18 03/23/2015 0842   ALKPHOS 45 11/19/2020 0532   ALKPHOS 78 03/23/2015 0842   BILITOT 1.0 11/19/2020 0532    BILITOT 1.1 03/23/2015 0842   GFRNONAA 42 (L) 11/20/2020 0437   GFRNONAA 47 (L) 03/23/2015 0842   GFRAA 42 (L) 07/08/2020 1143   GFRAA 55 (L) 03/23/2015 0842     VAS [place] ABI WITH/WO TBI  Result Date: 01/02/2021 LOWER EXTREMITY DOPPLER STUDY Indications: Rest pain, and peripheral artery disease. High Risk Factors: Hypertension.  Comparison Study: 10/18/2016 Performing Technologist: Reece Agar RT (R)(VS)  Examination Guidelines: A complete evaluation includes at minimum, Doppler waveform signals and systolic blood pressure reading at the level of bilateral brachial, anterior tibial, and posterior tibial arteries, when vessel segments are accessible. Bilateral testing is considered an integral part of a complete examination. Photoelectric Plethysmograph (PPG) waveforms and toe systolic pressure readings are included as required and additional duplex testing as needed. Limited examinations for reoccurring indications may be performed as noted.  ABI Findings: +---------+------------------+-----+---------+--------+ Right    Rt Pressure (mmHg)IndexWaveform Comment  +---------+------------------+-----+---------+--------+ Brachial 136                                      +---------+------------------+-----+---------+--------+ ATA      100               0.71 triphasic         +---------+------------------+-----+---------+--------+ PTA      91                0.65 biphasic          +---------+------------------+-----+---------+--------+ Great Toe90                0.64 Dampened          +---------+------------------+-----+---------+--------+ +---------+------------------+-----+----------+-------+ Left     Lt Pressure (mmHg)IndexWaveform  Comment +---------+------------------+-----+----------+-------+ Brachial 140                                      +---------+------------------+-----+----------+-------+ ATA      69                0.49 monophasic         +---------+------------------+-----+----------+-------+ PTA      76                0.54 monophasic        +---------+------------------+-----+----------+-------+ Great Toe87                0.62 Abnormal          +---------+------------------+-----+----------+-------+ +-------+-----------+-----------+------------+------------+ ABI/TBIToday's ABIToday's TBIPrevious ABIPrevious TBI +-------+-----------+-----------+------------+------------+ Right  .71        .64        1.06        .49          +-------+-----------+-----------+------------+------------+ Left   .54        .62        .71         .50          +-------+-----------+-----------+------------+------------+ Bilateral ABIs appear decreased compared to prior study on 10/18/2016. Bilateral TBIs appear essentially unchanged compared to prior study on 10/18/2016.  Summary: Right: Resting right ankle-brachial index indicates moderate right lower extremity arterial disease. The right toe-brachial index is abnormal. Left: Resting left ankle-brachial index indicates moderate left lower extremity arterial disease. The left toe-brachial index is abnormal. *See table(s) above for measurements and observations.  Electronically signed by Festus Barren MD on 01/02/2021 at 8:46:23 AM.   Final        Assessment & Plan:   1. PVD (peripheral vascular disease) (HCC)  Recommend:  The patient has evidence of atherosclerosis of the lower extremities with claudication.  The patient does not voice  lifestyle limiting changes at this point in time.  No invasive studies, angiography or surgery at this time discussed angiogram with patient but she did not wish to proceed at this time. The patient should continue walking and begin a more formal exercise program.  The patient should continue antiplatelet therapy and aggressive treatment of the lipid abnormalities  No changes in the patient's medications at this time  The patient should continue wearing  graduated compression socks 10-15 mmHg strength to control the mild edema.   Will have patient follow-up in 3 months with noninvasive studies. 2. Primary hypertension Continue antihypertensive medications as already ordered, these medications have been reviewed and there are no changes at this time.   3. Primary osteoarthritis of both knees Continue NSAID medications as already ordered, these medications have been reviewed and there are no changes at this time.  Continued activity and therapy was stressed.    Current Outpatient Medications on File Prior to Visit  Medication Sig Dispense Refill  . albuterol (VENTOLIN HFA) 108 (90 Base) MCG/ACT inhaler Inhale 2 puffs into the lungs every 6 (six) hours as needed for wheezing or shortness of breath. 18 g 0  . aspirin EC 81 MG tablet Take 81 mg by mouth daily.     . carbidopa-levodopa (SINEMET IR) 25-100 MG tablet Take 1 tablet by mouth at bedtime.     . furosemide (LASIX) 20 MG tablet Take 1 tablet (20 mg total) by mouth daily. 30 tablet 0  . gabapentin (NEURONTIN) 100 MG capsule Take by mouth.    . latanoprost (XALATAN) 0.005 % ophthalmic solution Place 1 drop into both eyes at bedtime.  5  . metoprolol succinate (TOPROL-XL) 100 MG 24 hr tablet Take 100 mg by mouth daily.    . nitroGLYCERIN (NITROSTAT) 0.4 MG SL tablet Place 1 tablet (0.4 mg total) under the tongue every 5 (five) minutes as needed for chest pain. 30 tablet 0  . olmesartan-hydrochlorothiazide (BENICAR HCT) 20-12.5 MG tablet Take 1 tablet by mouth daily.    . clopidogrel (PLAVIX) 75 MG tablet Take 1 tablet (75 mg total) by mouth daily. (Patient not taking: Reported on 12/26/2020) 30 tablet 0  . fluticasone (FLONASE) 50 MCG/ACT nasal spray Place 2 sprays into both nostrils daily. (Patient not taking: Reported on 12/26/2020) 16 g 0  . irbesartan (AVAPRO) 75 MG tablet Take 1 tablet (75 mg total) by mouth daily. (Patient not taking: Reported on 12/26/2020) 30 tablet 0  .  isosorbide mononitrate (IMDUR) 30 MG 24 hr tablet Take 1 tablet (30 mg total) by mouth daily. (Patient not taking: Reported on 12/26/2020) 30 tablet 0  . polyethylene glycol (MIRALAX / GLYCOLAX) 17 g packet Take 17 g by mouth daily as needed for severe constipation. (Patient not taking: Reported on 12/26/2020) 30 each 0   No current facility-administered medications on file prior to visit.    There are no Patient Instructions on file for this visit. No follow-ups on file.   Georgiana Spinner, NP

## 2021-01-08 NOTE — Progress Notes (Signed)
Virtual Visit completed. Patient informed on EP and RD appointment and 6 Minute walk test. Patient also informed of patient health questionnaires on My Chart. Patient Verbalizes understanding. Visit diagnosis can be found in CHL11/19/2021. 

## 2021-01-09 ENCOUNTER — Ambulatory Visit (INDEPENDENT_AMBULATORY_CARE_PROVIDER_SITE_OTHER): Payer: PPO | Admitting: Internal Medicine

## 2021-01-09 VITALS — BP 154/77 | HR 72 | Wt 213.6 lb

## 2021-01-09 DIAGNOSIS — D869 Sarcoidosis, unspecified: Secondary | ICD-10-CM | POA: Diagnosis not present

## 2021-01-09 DIAGNOSIS — I2729 Other secondary pulmonary hypertension: Secondary | ICD-10-CM

## 2021-01-09 DIAGNOSIS — R42 Dizziness and giddiness: Secondary | ICD-10-CM

## 2021-01-09 DIAGNOSIS — K219 Gastro-esophageal reflux disease without esophagitis: Secondary | ICD-10-CM

## 2021-01-09 DIAGNOSIS — I10 Essential (primary) hypertension: Secondary | ICD-10-CM

## 2021-01-09 DIAGNOSIS — M170 Bilateral primary osteoarthritis of knee: Secondary | ICD-10-CM | POA: Diagnosis not present

## 2021-01-15 ENCOUNTER — Encounter: Payer: Self-pay | Admitting: Internal Medicine

## 2021-01-15 DIAGNOSIS — R42 Dizziness and giddiness: Secondary | ICD-10-CM | POA: Insufficient documentation

## 2021-01-15 NOTE — Assessment & Plan Note (Signed)
Patient dizziness is due to inner ear disease also cervical spine osteoarthritis and gait imbalance.  I suggested her to drink a lot of fluid and take Claritin 5 mg p.o. daily as needed for dizziness.  Use of walker or a stick to walk.

## 2021-01-15 NOTE — Assessment & Plan Note (Signed)
-   The patient's GERD is stable on medication.  - Instructed the patient to avoid eating spicy and acidic foods, as well as foods high in fat. - Instructed the patient to avoid eating large meals or meals 2-3 hours prior to sleeping. 

## 2021-01-15 NOTE — Progress Notes (Signed)
Established Patient Office Visit  Subjective:  Patient ID: Sheila Long, female    DOB: December 21, 1924  Age: 87 y.o. MRN: [id number]  CC:  Chief Complaint  Patient presents with  . 1 month    1 month follow-up on blurred vision and dizziness. Still having symptoms    HPI  Sheila Long presents for general checkup, patient dizziness is due to inner ear and neck arthritis.  She denies any history of chest pain or passing out spell denies any history of palpitation.  There is no history of swelling in the legs suggestive of blood clot. Past Medical History:  Diagnosis Date  . Allergic rhinitis due to pollen   . Anxiety   . Aortic atherosclerosis (HCC)   . CHF (congestive heart failure) (HCC)   . Coronary artery disease    Stent in 2000's (DC)  . Dyspnea   . GERD (gastroesophageal reflux disease)   . Hard of hearing   . Hypertension   . Neuropathy   . Obstructive sleep apnea   . Osteoarthritis of both knees   . Peripheral vascular disease (HCC)   . Sarcoidosis   . Thyroid nodule     Past Surgical History:  Procedure Laterality Date  . ABDOMINAL HYSTERECTOMY    . BIOPSY THYROID  2014   Dr Andee Poles  . BREAST EXCISIONAL BIOPSY Left 1980s   surgical bx neg  . BREAST EXCISIONAL BIOPSY Left 1980s   benign  . CARDIOVASCULAR STRESS TEST  05/12/2012   with dobutamine---negative. Normal LV function  . CORONARY ANGIOPLASTY  2006  . ESOPHAGOGASTRODUODENOSCOPY (EGD) WITH PROPOFOL N/A 04/03/2020   Procedure: ESOPHAGOGASTRODUODENOSCOPY (EGD) WITH PROPOFOL;  Surgeon: [name], MD;  Location: [facility] ENDOSCOPY;  Service: Gastroenterology;  Laterality: N/A;  . JOINT REPLACEMENT Left 2000   hip  . JOINT REPLACEMENT Right 2007   hip  . KNEE ARTHROPLASTY Right 06/23/2017   Procedure: COMPUTER ASSISTED TOTAL KNEE ARTHROPLASTY;  Surgeon: Donato Heinz, MD;  Location: [facility] ORS;  Service: Orthopedics;  Laterality: Right;    Family History  Problem Relation Age of Onset  .  Cancer Sister 33       unknown - groin area  . Heart attack Brother   . Cancer Sister 1       pancreatic   . Heart attack Sister   . Heart attack Brother     Social History   Socioeconomic History  . Marital status: Widowed    Spouse name: Not on file  . Number of children: 0  . Years of education: Not on file  . Highest education level: Not on file  Occupational History  . 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 Smoking status: Never Smoker  . Smokeless tobacco: Never Used  Vaping Use  . Vaping Use: Never used  Substance and Sexual Activity  . Alcohol use: No  . Drug use: No  . Sexual activity: Not Currently    Birth control/protection: Post-menopausal  Other Topics Concern  . Not on file  Social History Narrative   Past living will   Needs to redo health care POA   Requests sister [employer] as decision makers   Would accept resuscitation   Not sure about tube feeds   Social Determinants of Health   Financial Resource Strain: Not on file  Food Insecurity: Not on file  Transportation Needs: Not on file  Physical Activity: Not on file  Stress: Not on file  Social Connections: Not on file  Intimate Partner Violence: Not on file     Current Outpatient Medications:  .  albuterol (VENTOLIN HFA) 108 (90 Base) MCG/ACT inhaler, Inhale 2 puffs into the lungs every 6 (six) hours as needed for wheezing or shortness of breath., Disp: 18 g, Rfl: 0 .  aspirin EC 81 MG tablet, Take 81 mg by mouth daily. , Disp: , Rfl:  .  carbidopa-levodopa (SINEMET IR) 25-100 MG tablet, Take 1 tablet by mouth at bedtime. , Disp: , Rfl:  .  clopidogrel (PLAVIX) 75 MG tablet, Take 1 tablet (75 mg total) by mouth daily., Disp: 30 tablet, Rfl: 0 .  fluticasone (FLONASE) 50 MCG/ACT nasal spray, Place 2 sprays into both nostrils daily., Disp: 16 g, Rfl: 0 .  furosemide (LASIX) 20 MG tablet, Take 1 tablet (20 mg total) by mouth daily., Disp: 30 tablet, Rfl:  0 .  irbesartan (AVAPRO) 75 MG tablet, Take 1 tablet (75 mg total) by mouth daily., Disp: 30 tablet, Rfl: 0 .  latanoprost (XALATAN) 0.005 % ophthalmic solution, Place 1 drop into both eyes at bedtime., Disp: , Rfl: 5 .  metoprolol succinate (TOPROL-XL) 100 MG 24 hr tablet, Take 100 mg by mouth daily., Disp: , Rfl:  .  olmesartan-hydrochlorothiazide (BENICAR HCT) 20-12.5 MG tablet, Take 1 tablet by mouth daily., Disp: , Rfl:  .  gabapentin (NEURONTIN) 100 MG capsule, Take by mouth. (Patient not taking: Reported on 01/09/2021), Disp: , Rfl:  .  isosorbide mononitrate (IMDUR) 30 MG 24 hr tablet, Take 1 tablet (30 mg total) by mouth daily. (Patient not taking: No sig reported), Disp: 30 tablet, Rfl: 0 .  nitroGLYCERIN (NITROSTAT) 0.4 MG SL tablet, Place 1 tablet (0.4 mg total) under the tongue every 5 (five) minutes as needed for chest pain. (Patient not taking: Reported on 01/09/2021), Disp: 30 tablet, Rfl: 0 .  polyethylene glycol (MIRALAX / GLYCOLAX) 17 g packet, Take 17 g by mouth daily as needed for severe constipation. (Patient not taking: No sig reported), Disp: 30 each, Rfl: 0   Allergies  Allergen Reactions  . Tramadol Other (See Comments)    CONFUSION  . Sulfa Antibiotics     Patient does not recall  . Zocor [Simvastatin] Other (See Comments)    INTOLERANCE-MYALGIAS     ROS Review of Systems  Constitutional: Negative.   HENT: Positive for hearing loss. Negative for ear pain and mouth sores.   Eyes: Negative.  Negative for itching.  Respiratory: Negative.   Cardiovascular: Negative.   Gastrointestinal: Negative.   Endocrine: Negative.  Negative for polydipsia.  Genitourinary: Negative.   Musculoskeletal: Positive for back pain, gait problem and neck pain.  Skin: Negative.   Allergic/Immunologic: Negative.   Hematological: Negative.   Psychiatric/Behavioral: Negative.   All other systems reviewed and are negative.     Objective:    Physical Exam Vitals reviewed.   Constitutional:      Appearance: Normal appearance.  HENT:     Mouth/Throat:     Mouth: Mucous membranes are moist.  Eyes:     Pupils: Pupils are equal, round, and reactive to light.  Neck:     Vascular: No carotid bruit.  Cardiovascular:     Rate and Rhythm: Normal rate and regular rhythm.     Pulses: Normal pulses.     Heart sounds: Normal heart sounds.  Pulmonary:     Effort: Pulmonary effort is normal.     Breath sounds: Normal breath sounds.  Abdominal:  General: Bowel sounds are normal.     Palpations: Abdomen is soft. There is no hepatomegaly, splenomegaly or mass.     Hernia: No hernia is present.  Musculoskeletal:        General: No tenderness.     Cervical back: Neck supple. No rigidity.     Right lower leg: No edema.     Left lower leg: No edema.  Lymphadenopathy:     Cervical: No cervical adenopathy.  Skin:    Coloration: Skin is not jaundiced.     Findings: No rash.  Neurological:     Mental Status: She is alert and oriented to person, place, and time.     Motor: Weakness present.  Psychiatric:        Mood and Affect: Mood and affect normal.        Behavior: Behavior normal.     BP (!) 154/77   Pulse 72   Wt 213 lb 9.6 oz (96.9 kg)   BMI 36.66 kg/m  Wt Readings from Last 3 Encounters:  01/09/21 213 lb 9.6 oz (96.9 kg)  12/26/20 217 lb (98.4 kg)  11/30/20 212 lb 8 oz (96.4 kg)     Health Maintenance Due  Topic Date Due  . TETANUS/TDAP  Never done  . DEXA SCAN  Never done  . PNA vac Low Risk Adult (2 of 2 - PPSV23) 09/24/2017  . INFLUENZA VACCINE  Never done    There are no preventive care reminders to display for this patient.  Lab Results  Component Value Date   TSH 1.674 11/17/2020   Lab Results  Component Value Date   WBC 7.4 11/19/2020   HGB 11.1 (L) 11/19/2020   HCT 31.2 (L) 11/19/2020   MCV 98.1 11/19/2020   PLT 152 11/19/2020   Lab Results  Component Value Date   NA 138 11/20/2020   K 3.7 11/20/2020   CO2 25  11/20/2020   GLUCOSE 126 (H) 11/20/2020   BUN 24 (H) 11/20/2020   CREATININE 1.20 (H) 11/20/2020   BILITOT 1.0 11/19/2020   ALKPHOS 45 11/19/2020   AST 19 11/19/2020   ALT <5 11/19/2020   PROT 5.5 (L) 11/19/2020   ALBUMIN 3.1 (L) 11/19/2020   CALCIUM 8.6 (L) 11/20/2020   ANIONGAP 13 11/20/2020   GFR 61.85 12/24/2016   No results found for: CHOL No results found for: HDL No results found for: LDLCALC No results found for: TRIG No results found for: CHOLHDL Lab Results  Component Value Date   HGBA1C 6.4 10/30/2017      Assessment & Plan:   Problem List Items Addressed This Visit      Cardiovascular and Mediastinum   Hypertension - Primary    - Today, the patient's blood pressure is not well managed on present  Regime   Told to lose wt and follow diet. - The patient will continue the current treatment regimen.  - I encouraged the patient to eat a low-sodium diet to help control blood pressure. - I encouraged the patient to live an active lifestyle and complete activities that increases heart rate to 85% target heart rate at least 5 times per week for one hour.          Pulmonary hypertension associated with sarcoidosis (HCC)    Stable at the present time no wheezing was noted.  There is no cervical lymphadenopathy.        Digestive   GERD (gastroesophageal reflux disease)    - The patient's GERD is  stable on medication.  - Instructed the patient to avoid eating spicy and acidic foods, as well as foods high in fat. - Instructed the patient to avoid eating large meals or meals 2-3 hours prior to sleeping.         Musculoskeletal and Integument   Osteoarthritis of both knees    Patient has a chronic pain in the both knee it is secondary to primary osteoarthritis of the both knee joint.  Suggest symptomatic treatment.        Other   Dizziness    Patient dizziness is due to inner ear disease also cervical spine osteoarthritis and gait imbalance.  I suggested her  to drink a lot of fluid and take Claritin 5 mg p.o. daily as needed for dizziness.  Use of walker or a stick to walk.         No orders of the defined types were placed in this encounter.   Follow-up: No follow-ups on file.    Corky DownsJaved Belen Pesch, MD

## 2021-01-15 NOTE — Assessment & Plan Note (Signed)
-   Today, the patient's blood pressure is not well managed on present  Regime   Told to lose wt and follow diet. - The patient will continue the current treatment regimen.  - I encouraged the patient to eat a low-sodium diet to help control blood pressure. - I encouraged the patient to live an active lifestyle and complete activities that increases heart rate to 85% target heart rate at least 5 times per week for one hour.

## 2021-01-15 NOTE — Assessment & Plan Note (Signed)
Stable at the present time no wheezing was noted.  There is no cervical lymphadenopathy.

## 2021-01-15 NOTE — Assessment & Plan Note (Signed)
Patient has a chronic pain in the both knee it is secondary to primary osteoarthritis of the both knee joint.  Suggest symptomatic treatment.

## 2021-01-25 DIAGNOSIS — H903 Sensorineural hearing loss, bilateral: Secondary | ICD-10-CM | POA: Diagnosis not present

## 2021-01-25 DIAGNOSIS — H6123 Impacted cerumen, bilateral: Secondary | ICD-10-CM | POA: Diagnosis not present

## 2021-01-26 ENCOUNTER — Telehealth (INDEPENDENT_AMBULATORY_CARE_PROVIDER_SITE_OTHER): Payer: Self-pay

## 2021-01-26 NOTE — Telephone Encounter (Signed)
Patient called stating that for the past month she has been having a lot bruises and she concern about having a dvt. The patient is taking Aspirin 81mg  and she is prescribed Clopidogrel but she has stop taking it. 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 The patient was made aware with medial advice but she will speak with her PCP if she can take the blood thinner with one of her blood pressure medication. Patient stated that she will call back to see if she can have her appointment moved up patient prefer to see Dew.

## 2021-02-13 DIAGNOSIS — I739 Peripheral vascular disease, unspecified: Secondary | ICD-10-CM | POA: Diagnosis not present

## 2021-02-13 DIAGNOSIS — Z96651 Presence of right artificial knee joint: Secondary | ICD-10-CM | POA: Diagnosis not present

## 2021-02-13 DIAGNOSIS — G8929 Other chronic pain: Secondary | ICD-10-CM | POA: Diagnosis not present

## 2021-02-13 DIAGNOSIS — M5442 Lumbago with sciatica, left side: Secondary | ICD-10-CM | POA: Diagnosis not present

## 2021-02-13 DIAGNOSIS — M5441 Lumbago with sciatica, right side: Secondary | ICD-10-CM | POA: Diagnosis not present

## 2021-02-14 ENCOUNTER — Other Ambulatory Visit: Payer: Self-pay | Admitting: Internal Medicine

## 2021-03-01 DIAGNOSIS — H40153 Residual stage of open-angle glaucoma, bilateral: Secondary | ICD-10-CM | POA: Diagnosis not present

## 2021-03-13 ENCOUNTER — Other Ambulatory Visit: Payer: Self-pay | Admitting: *Deleted

## 2021-03-13 MED ORDER — PANTOPRAZOLE SODIUM 40 MG PO TBEC: 40.0000 mg | DELAYED_RELEASE_TABLET | Freq: Every day | ORAL | 3 refills | Status: DC

## 2021-03-13 MED ORDER — BENZONATATE 100 MG PO CAPS: 100.0000 mg | ORAL_CAPSULE | Freq: Three times a day (TID) | ORAL | 0 refills | Status: DC | PRN

## 2021-03-19 ENCOUNTER — Other Ambulatory Visit (INDEPENDENT_AMBULATORY_CARE_PROVIDER_SITE_OTHER): Payer: Self-pay | Admitting: Nurse Practitioner

## 2021-03-19 DIAGNOSIS — I739 Peripheral vascular disease, unspecified: Secondary | ICD-10-CM

## 2021-03-20 ENCOUNTER — Ambulatory Visit (INDEPENDENT_AMBULATORY_CARE_PROVIDER_SITE_OTHER): Payer: PPO

## 2021-03-20 ENCOUNTER — Ambulatory Visit (INDEPENDENT_AMBULATORY_CARE_PROVIDER_SITE_OTHER): Payer: PPO | Admitting: Vascular Surgery

## 2021-03-20 ENCOUNTER — Encounter (INDEPENDENT_AMBULATORY_CARE_PROVIDER_SITE_OTHER): Payer: Self-pay | Admitting: Vascular Surgery

## 2021-03-20 ENCOUNTER — Other Ambulatory Visit: Payer: Self-pay

## 2021-03-20 VITALS — BP 143/80 | HR 78 | Resp 16 | Wt 208.0 lb

## 2021-03-20 DIAGNOSIS — I10 Essential (primary) hypertension: Secondary | ICD-10-CM | POA: Diagnosis not present

## 2021-03-20 DIAGNOSIS — I739 Peripheral vascular disease, unspecified: Secondary | ICD-10-CM

## 2021-03-20 DIAGNOSIS — G629 Polyneuropathy, unspecified: Secondary | ICD-10-CM | POA: Diagnosis not present

## 2021-03-20 NOTE — Progress Notes (Signed)
MRN : [id number]  Sheila Long is a 87 y.o. (03-18-1924) female who presents with chief complaint of  Chief Complaint  Patient presents with  . Follow-up    3 month ABI  .  History of Present Illness: Patient returns today in follow up of her PAD.  She continues to have neuropathic pain in both feet.  She is also getting some neuropathic pain in her hands.  She does not really have claudication but her ambulation is slow and limited at this point.  She has been following with Dr. Ernest Pine who I discussed her with a couple of weeks ago.  She is in the process of being evaluated for steroid back injections although she sounds leery of this. We rechecked her lower extremity perfusion today.   Her ABIs are actually up to 0.95 on the right and stable at 0.53 on the left.  Current Outpatient Medications  Medication Sig Dispense Refill  . albuterol (VENTOLIN HFA) 108 (90 Base) MCG/ACT inhaler Inhale 2 puffs into the lungs every 6 (six) hours as needed for wheezing or shortness of breath. 18 g 0  . aspirin EC 81 MG tablet Take 81 mg by mouth daily.     . metoprolol succinate (TOPROL-XL) 100 MG 24 hr tablet Take 100 mg by mouth daily.    Marland Kitchen olmesartan-hydrochlorothiazide (BENICAR HCT) 20-12.5 MG tablet Take 1 tablet by mouth daily.    . pantoprazole (PROTONIX) 40 MG tablet Take 1 tablet (40 mg total) by mouth daily. 30 tablet 3  . benzonatate (TESSALON PERLES) 100 MG capsule Take 1 capsule (100 mg total) by mouth 3 (three) times daily as needed for cough. (Patient not taking: Reported on 03/20/2021) 90 capsule 0  . carbidopa-levodopa (SINEMET IR) 25-100 MG tablet Take 1 tablet by mouth at bedtime.  (Patient not taking: Reported on 03/20/2021)    . clopidogrel (PLAVIX) 75 MG tablet Take 1 tablet (75 mg total) by mouth daily. (Patient not taking: Reported on 03/20/2021) 30 tablet 0  . fluticasone (FLONASE) 50 MCG/ACT nasal spray Place 2 sprays into both nostrils daily. (Patient not taking: Reported  on 03/20/2021) 16 g 0  . furosemide (LASIX) 20 MG tablet Take 1 tablet (20 mg total) by mouth daily. (Patient not taking: Reported on 03/20/2021) 30 tablet 0  . gabapentin (NEURONTIN) 100 MG capsule Take by mouth. (Patient not taking: No sig reported)    . irbesartan (AVAPRO) 75 MG tablet Take 1 tablet (75 mg total) by mouth daily. (Patient not taking: Reported on 03/20/2021) 30 tablet 0  . isosorbide mononitrate (IMDUR) 30 MG 24 hr tablet Take 1 tablet (30 mg total) by mouth daily. (Patient not taking: No sig reported) 30 tablet 0  . latanoprost (XALATAN) 0.005 % ophthalmic solution Place 1 drop into both eyes at bedtime. (Patient not taking: Reported on 03/20/2021)  5  . nitroGLYCERIN (NITROSTAT) 0.4 MG SL tablet Place 1 tablet (0.4 mg total) under the tongue every 5 (five) minutes as needed for chest pain. (Patient not taking: No sig reported) 30 tablet 0  . polyethylene glycol (MIRALAX / GLYCOLAX) 17 g packet Take 17 g by mouth daily as needed for severe constipation. (Patient not taking: No sig reported) 30 each 0   No current facility-administered medications for this visit.    Past Medical History:  Diagnosis Date  . Allergic rhinitis due to pollen   . Anxiety   . Aortic atherosclerosis (HCC)   . CHF (congestive heart failure) (HCC)   .  Coronary artery disease    Stent in 2000's (DC)  . Dyspnea   . GERD (gastroesophageal reflux disease)   . Hard of hearing   . Hypertension   . Neuropathy   . Obstructive sleep apnea   . Osteoarthritis of both knees   . Peripheral vascular disease (HCC)   . Sarcoidosis   . Thyroid nodule     Past Surgical History:  Procedure Laterality Date  . ABDOMINAL HYSTERECTOMY    . BIOPSY THYROID  2014   Dr Andee Poles  . BREAST EXCISIONAL BIOPSY Left 1980s   surgical bx neg  . BREAST EXCISIONAL BIOPSY Left 1980s   benign  . CARDIOVASCULAR STRESS TEST  05/12/2012   with dobutamine---negative. Normal LV function  . CORONARY ANGIOPLASTY  2006  .  ESOPHAGOGASTRODUODENOSCOPY (EGD) WITH PROPOFOL N/A 04/03/2020   Procedure: ESOPHAGOGASTRODUODENOSCOPY (EGD) WITH PROPOFOL;  Surgeon: [name], MD;  Location: [facility] ENDOSCOPY;  Service: Gastroenterology;  Laterality: N/A;  . JOINT REPLACEMENT Left 2000   hip  . JOINT REPLACEMENT Right 2007   hip  . KNEE ARTHROPLASTY Right 06/23/2017   Procedure: COMPUTER ASSISTED TOTAL KNEE ARTHROPLASTY;  Surgeon: Donato Heinz, MD;  Location: [facility] ORS;  Service: Orthopedics;  Laterality: Right;     Social History   Tobacco Use  . Smoking status: Never Smoker  . Smokeless tobacco: Never Used  Vaping Use  . Vaping Use: Never used  Substance Use Topics  . Alcohol use: No  . Drug use: No      Family History  Problem Relation Age of Onset  . Cancer Sister 60       unknown - groin area  . Heart attack Brother   . Cancer Sister 6       pancreatic   . Heart attack Sister   . Heart attack Brother      Allergies  Allergen Reactions  . Tramadol Other (See Comments)    CONFUSION  . Sulfa Antibiotics     Patient does not recall  . Zocor [Simvastatin] Other (See Comments)    INTOLERANCE-MYALGIAS      REVIEW OF SYSTEMS (Negative unless checked)  Constitutional: [] ?Weight loss  [] ?Fever  [] ?Chills Cardiac: [] ?Chest pain   [] ?Chest pressure   [] ?Palpitations   [] ?[name] when laying flat   [] ?[name] at rest   [] ?[name] with exertion. Vascular:  [x] ?Pain in legs with walking   [x] ?Pain in legs at rest   [] ?Pain in legs when laying flat   [] ?Claudication   [] ?Pain in feet when walking  [] ?Pain in feet at rest  [] ?Pain in feet when laying flat   [] ?History of DVT   [] ?Phlebitis   [] ?Swelling in legs   [] ?Varicose veins   [] ?Non-healing ulcers Pulmonary:   [] ?Uses home oxygen   [] ?Productive cough   [] ?Hemoptysis   [] ?Wheeze  [] ?COPD   [] ?Asthma Neurologic:  [] ?Dizziness  [] ?Blackouts   [] ?Seizures   [] ?History of stroke   [] ?History of TIA  [] ?Aphasia    [] ?Temporary blindness   [] ?Dysphagia   [] ?Weakness or numbness in arms   [] ?Weakness or numbness in legs Musculoskeletal:  [x] ?Arthritis   [x] ?Joint swelling   [x] ?Joint pain   [] ?Low back pain Hematologic:  [] ?Easy bruising  [] ?Easy bleeding   [] ?Hypercoagulable state   [] ?Anemic   Gastrointestinal:  [] ?Blood in stool   [] ?Vomiting blood  [] ?Gastroesophageal reflux/heartburn   [] ?Abdominal pain Genitourinary:  [] ?Chronic kidney disease   [] ?Difficult urination  [] ?Frequent urination  [] ?Burning with urination   [] ?  Hematuria Skin:  [] ?Rashes   [] ?Ulcers   [] ?Wounds Psychological:  [] ?History of anxiety   [] ? History of major depression.  Physical Examination  BP (!) 143/80 (BP Location: Right Arm)   Pulse 78   Resp 16   Wt 208 lb (94.3 kg)   BMI 35.70 kg/m  Gen:  WD/WN, NAD.  Appears younger than stated age. Head: [place]/AT, No temporalis wasting. Ear/Nose/Throat: Hearing grossly intact, nares w/o erythema or drainage Eyes: Conjunctiva clear. Sclera non-icteric Neck: Supple.  Trachea midline Pulmonary:  Good air movement, no use of accessory muscles.  Cardiac: Somewhat irregular Vascular:  Vessel Right Left  Radial Palpable Palpable                          PT  1+ palpable  1+ palpable  DP  1+ palpable  trace palpable    Musculoskeletal: M/S 5/5 throughout.  No deformity or atrophy.  Uses a walker.  Mild bilateral lower extremity edema. Neurologic: Sensation grossly intact in extremities.  Symmetrical.  Speech is fluent.  Psychiatric: Judgment intact, Mood & affect appropriate for pt's clinical situation. Dermatologic: No rashes or ulcers noted.  No cellulitis or open wounds.       Labs No results found for this or any previous visit (from the past 2160 hour(s)).  Radiology No results found.  Assessment/Plan Hypertension blood pressure control important in reducing the progression of atherosclerotic disease. On appropriate oral medications.   Thigh pain The  patient complains and the pain is worse on the right than the left. Her right leg has normal perfusion in her left leg is only mild to moderately reduced. I do not think her symptoms are from poor circulation and with much more strongly suspect arthritis and neuropathy.  Neuropathy (HCC) Given the stable to improved nature of her perfusion, I think most of her lower extremity symptoms are neuropathic in nature.  Particular given her advanced age, I would be leery to do anything invasive without being sure that improving her perfusion would actually improve her symptoms.  She has tried Neurontin in the past for her neuropathy but that did not help.  She is interested in trying Lyrica.  She is going to discuss the steroid injections with Dr. as well.  We will plan to recheck her in 6 months with noninvasive studies.  Peripheral vascular disease (HCC) Her ABIs are actually up to 0.95 on the right and stable at 0.53 on the left.  We again discussed that perfusion is possibly a contributing factor to her lower extremity symptoms, but probably not the primary cause.  Neuropathic pain is more likely.  I would not plan on performing any intervention at this time as I think the benefit may be limited and given her advanced age that is not without risk.  We will plan to recheck her in 6 months with noninvasive studies.    , MD  03/21/2021 10:06 AM    This note was created with Dragon medical transcription system.  Any errors from dictation are purely unintentional

## 2021-03-21 NOTE — Assessment & Plan Note (Signed)
Her ABIs are actually up to 0.95 on the right and stable at 0.53 on the left.  We again discussed that perfusion is possibly a contributing factor to her lower extremity symptoms, but probably not the primary cause.  Neuropathic pain is more likely.  I would not plan on performing any intervention at this time as I think the benefit may be limited and given her advanced age that is not without risk.  We will plan to recheck her in 6 months with noninvasive studies.

## 2021-03-21 NOTE — Assessment & Plan Note (Signed)
Given the stable to improved nature of her perfusion, I think most of her lower extremity symptoms are neuropathic in nature.  Particular given her advanced age, I would be leery to do anything invasive without being sure that improving her perfusion would actually improve her symptoms.  She has tried Neurontin in the past for her neuropathy but that did not help.  She is interested in trying Lyrica.  She is going to discuss the steroid injections with Dr. Ernest Pine as well.  We will plan to recheck her in 6 months with noninvasive studies.

## 2021-03-23 DIAGNOSIS — H6122 Impacted cerumen, left ear: Secondary | ICD-10-CM | POA: Diagnosis not present

## 2021-03-23 DIAGNOSIS — H6063 Unspecified chronic otitis externa, bilateral: Secondary | ICD-10-CM | POA: Diagnosis not present

## 2021-03-23 DIAGNOSIS — H903 Sensorineural hearing loss, bilateral: Secondary | ICD-10-CM | POA: Diagnosis not present

## 2021-04-09 DIAGNOSIS — R0602 Shortness of breath: Secondary | ICD-10-CM | POA: Diagnosis not present

## 2021-04-09 DIAGNOSIS — I2510 Atherosclerotic heart disease of native coronary artery without angina pectoris: Secondary | ICD-10-CM | POA: Diagnosis not present

## 2021-04-09 DIAGNOSIS — R600 Localized edema: Secondary | ICD-10-CM | POA: Diagnosis not present

## 2021-04-09 DIAGNOSIS — I214 Non-ST elevation (NSTEMI) myocardial infarction: Secondary | ICD-10-CM | POA: Diagnosis not present

## 2021-04-09 DIAGNOSIS — I10 Essential (primary) hypertension: Secondary | ICD-10-CM | POA: Diagnosis not present

## 2021-04-09 DIAGNOSIS — I739 Peripheral vascular disease, unspecified: Secondary | ICD-10-CM | POA: Diagnosis not present

## 2021-04-26 ENCOUNTER — Other Ambulatory Visit: Payer: Self-pay | Admitting: Internal Medicine

## 2021-04-26 DIAGNOSIS — N181 Chronic kidney disease, stage 1: Secondary | ICD-10-CM

## 2021-05-22 DIAGNOSIS — R1314 Dysphagia, pharyngoesophageal phase: Secondary | ICD-10-CM | POA: Diagnosis not present

## 2021-05-22 DIAGNOSIS — H6123 Impacted cerumen, bilateral: Secondary | ICD-10-CM | POA: Diagnosis not present

## 2021-05-22 DIAGNOSIS — K219 Gastro-esophageal reflux disease without esophagitis: Secondary | ICD-10-CM | POA: Diagnosis not present

## 2021-05-24 DIAGNOSIS — I739 Peripheral vascular disease, unspecified: Secondary | ICD-10-CM | POA: Diagnosis not present

## 2021-05-24 DIAGNOSIS — I2729 Other secondary pulmonary hypertension: Secondary | ICD-10-CM | POA: Diagnosis not present

## 2021-05-24 DIAGNOSIS — E538 Deficiency of other specified B group vitamins: Secondary | ICD-10-CM | POA: Diagnosis not present

## 2021-05-24 DIAGNOSIS — D869 Sarcoidosis, unspecified: Secondary | ICD-10-CM | POA: Diagnosis not present

## 2021-05-24 DIAGNOSIS — R7309 Other abnormal glucose: Secondary | ICD-10-CM | POA: Diagnosis not present

## 2021-05-24 DIAGNOSIS — R350 Frequency of micturition: Secondary | ICD-10-CM | POA: Diagnosis not present

## 2021-05-24 DIAGNOSIS — E559 Vitamin D deficiency, unspecified: Secondary | ICD-10-CM | POA: Diagnosis not present

## 2021-05-24 DIAGNOSIS — D508 Other iron deficiency anemias: Secondary | ICD-10-CM | POA: Diagnosis not present

## 2021-05-24 DIAGNOSIS — I10 Essential (primary) hypertension: Secondary | ICD-10-CM | POA: Diagnosis not present

## 2021-05-24 DIAGNOSIS — N1832 Chronic kidney disease, stage 3b: Secondary | ICD-10-CM | POA: Diagnosis not present

## 2021-06-16 ENCOUNTER — Emergency Department: Payer: PPO

## 2021-06-16 ENCOUNTER — Emergency Department
Admission: EM | Admit: 2021-06-16 | Discharge: 2021-06-16 | Disposition: A | Discharge status: Home / Self Care | Payer: PPO | Attending: Emergency Medicine | Admitting: Emergency Medicine

## 2021-06-16 ENCOUNTER — Encounter: Payer: Self-pay | Admitting: Emergency Medicine

## 2021-06-16 ENCOUNTER — Other Ambulatory Visit: Payer: Self-pay

## 2021-06-16 DIAGNOSIS — I130 Hypertensive heart and chronic kidney disease with heart failure and stage 1 through stage 4 chronic kidney disease, or unspecified chronic kidney disease: Secondary | ICD-10-CM | POA: Insufficient documentation

## 2021-06-16 DIAGNOSIS — Z7902 Long term (current) use of antithrombotics/antiplatelets: Secondary | ICD-10-CM | POA: Diagnosis not present

## 2021-06-16 DIAGNOSIS — Z96651 Presence of right artificial knee joint: Secondary | ICD-10-CM | POA: Diagnosis not present

## 2021-06-16 DIAGNOSIS — R110 Nausea: Secondary | ICD-10-CM | POA: Diagnosis not present

## 2021-06-16 DIAGNOSIS — R5381 Other malaise: Secondary | ICD-10-CM | POA: Diagnosis not present

## 2021-06-16 DIAGNOSIS — Z79899 Other long term (current) drug therapy: Secondary | ICD-10-CM | POA: Insufficient documentation

## 2021-06-16 DIAGNOSIS — I5033 Acute on chronic diastolic (congestive) heart failure: Secondary | ICD-10-CM | POA: Diagnosis not present

## 2021-06-16 DIAGNOSIS — Z96643 Presence of artificial hip joint, bilateral: Secondary | ICD-10-CM | POA: Insufficient documentation

## 2021-06-16 DIAGNOSIS — R42 Dizziness and giddiness: Secondary | ICD-10-CM

## 2021-06-16 DIAGNOSIS — E860 Dehydration: Secondary | ICD-10-CM | POA: Diagnosis not present

## 2021-06-16 DIAGNOSIS — Z7982 Long term (current) use of aspirin: Secondary | ICD-10-CM | POA: Insufficient documentation

## 2021-06-16 DIAGNOSIS — N189 Chronic kidney disease, unspecified: Secondary | ICD-10-CM | POA: Insufficient documentation

## 2021-06-16 DIAGNOSIS — I2510 Atherosclerotic heart disease of native coronary artery without angina pectoris: Secondary | ICD-10-CM | POA: Diagnosis not present

## 2021-06-16 LAB — COMPREHENSIVE METABOLIC PANEL
ALT: 12 U/L (ref 0–44)
AST: 22 U/L (ref 15–41)
Albumin: 3.8 g/dL (ref 3.5–5.0)
Alkaline Phosphatase: 46 U/L (ref 38–126)
Anion gap: 7 (ref 5–15)
BUN: 24 mg/dL — ABNORMAL HIGH (ref 8–23)
CO2: 26 mmol/L (ref 22–32)
Calcium: 8.8 mg/dL — ABNORMAL LOW (ref 8.9–10.3)
Chloride: 103 mmol/L (ref 98–111)
Creatinine, Ser: 1.34 mg/dL — ABNORMAL HIGH (ref 0.44–1.00)
GFR, Estimated: 37 mL/min — ABNORMAL LOW (ref >60)
Glucose, Bld: 153 mg/dL — ABNORMAL HIGH (ref 70–99)
Potassium: 4.1 mmol/L (ref 3.5–5.1)
Sodium: 136 mmol/L (ref 135–145)
Total Bilirubin: 1 mg/dL (ref 0.3–1.2)
Total Protein: 7.3 g/dL (ref 6.5–8.1)

## 2021-06-16 LAB — URINALYSIS, COMPLETE (UACMP) WITH MICROSCOPIC
Bilirubin Urine: NEGATIVE
Glucose, UA: NEGATIVE mg/dL
Hgb urine dipstick: NEGATIVE
Ketones, ur: NEGATIVE mg/dL
Nitrite: NEGATIVE
Protein, ur: NEGATIVE mg/dL
Specific Gravity, Urine: 1.014 (ref 1.005–1.030)
pH: 7 (ref 5.0–8.0)

## 2021-06-16 LAB — CBC WITH DIFFERENTIAL/PLATELET
Abs Immature Granulocytes: 0.01 10*3/uL (ref 0.00–0.07)
Basophils Absolute: 0 10*3/uL (ref 0.0–0.1)
Basophils Relative: 1 %
Eosinophils Absolute: 0.2 10*3/uL (ref 0.0–0.5)
Eosinophils Relative: 5 %
HCT: 33.2 % — ABNORMAL LOW (ref 36.0–46.0)
Hemoglobin: 11 g/dL — ABNORMAL LOW (ref 12.0–15.0)
Immature Granulocytes: 0 %
Lymphocytes Relative: 26 %
Lymphs Abs: 1.2 10*3/uL (ref 0.7–4.0)
MCH: 31.6 pg (ref 26.0–34.0)
MCHC: 33.1 g/dL (ref 30.0–36.0)
MCV: 95.4 fL (ref 80.0–100.0)
Monocytes Absolute: 0.3 10*3/uL (ref 0.1–1.0)
Monocytes Relative: 8 %
Neutro Abs: 2.7 10*3/uL (ref 1.7–7.7)
Neutrophils Relative %: 60 %
Platelets: 232 10*3/uL (ref 150–400)
RBC: 3.48 MIL/uL — ABNORMAL LOW (ref 3.87–5.11)
RDW: 14.1 % (ref 11.5–15.5)
WBC: 4.5 10*3/uL (ref 4.0–10.5)
nRBC: 0 % (ref 0.0–0.2)

## 2021-06-16 LAB — TROPONIN I (HIGH SENSITIVITY): Troponin I (High Sensitivity): 7 ng/L (ref <18)

## 2021-06-16 MED ORDER — CETIRIZINE HCL 10 MG PO TABS: 10.0000 mg | ORAL_TABLET | Freq: Every day | ORAL | 0 refills | Status: DC

## 2021-06-16 MED ORDER — MECLIZINE HCL 12.5 MG PO TABS: 12.5000 mg | ORAL_TABLET | Freq: Three times a day (TID) | ORAL | 0 refills | Status: DC | PRN

## 2021-06-16 MED ORDER — PREDNISONE 50 MG PO TABS: 50.0000 mg | ORAL_TABLET | Freq: Every day | ORAL | 0 refills | Status: DC

## 2021-06-16 NOTE — ED Triage Notes (Signed)
First RN Note: pt to ED via [facility] from home with c/o dizziness x 3 weeks. Per EMS pt A&O x4, ambulatory at home. Per EMS pt with no relief from dizziness. Per EMS pt currently taking plavix, denies recent falls/injuries.    CBG 201 Negative Orthostatics 130/90 NSR  98% RA  20G L forearm

## 2021-06-16 NOTE — ED Notes (Signed)
Patient transported to CT 

## 2021-06-16 NOTE — ED Provider Notes (Signed)
[facility] Emergency Department Provider Note  ____________________________________________  Time seen: Approximately 2:24 PM  I have reviewed the triage vital signs and the nursing notes.   HISTORY  Chief Complaint Dizziness    HPI Sheila Long is a 87 y.o. female who presents the emergency department complaining of dizziness.  Patient denied any nausea or vomiting.  She was unable to quantify the dizziness other than she felt unsteady.  Patient was unable to say where she felt like she was moving in the room was spinning around her.  She has had no chest pain, shortness of breath, abdominal pain, nausea vomiting, diarrhea or constipation.  No weakness.  According to the family member which is with the patient the patient has had no unilateral weakness.  No changes in mentation.  Patient does have a history of CHF, CAD, GERD, hypertension, sarcoidosis.  Patient has not tried any medications for his complaint.  When patient developed a headache today that presented to the ED for evaluation but again she has no unilateral weakness, no difficulty formulating thoughts or words.  Patient does state that she has a history of chronic sinus issues and sees ENT for same.       Past Medical History:  Diagnosis Date   Allergic rhinitis due to pollen    Anxiety    Aortic atherosclerosis (HCC)    CHF (congestive heart failure) (HCC)    Coronary artery disease    Stent in 2000's (DC)   Dyspnea    GERD (gastroesophageal reflux disease)    Hard of hearing    Hypertension    Neuropathy    Obstructive sleep apnea    Osteoarthritis of both knees    Peripheral vascular disease (HCC)    Sarcoidosis    Thyroid nodule     Patient Active Problem List   Diagnosis Date Noted   Dizziness 01/15/2021   Carpal tunnel syndrome 12/11/2020   PND (post-nasal drip)    Stenosis of right carotid artery    Acute kidney injury superimposed on CKD (HCC)    NSTEMI (non-ST  elevated myocardial infarction) (HCC) 11/18/2020   [name] 11/17/2020   Back pain without sciatica 11/07/2020   Numbness in both hands 11/07/2020   Numbness 10/05/2019   Axillary abscess 11/12/2018   Status post total replacement of both hips 10/04/2018   Prediabetes 10/30/2017   Acute on chronic diastolic CHF (congestive heart failure) (HCC) 10/29/2017   Anxiety 10/29/2017   Fatigue 07/29/2017   Mood disorder (HCC) 07/29/2017   Constipation 06/26/2017   S/P total knee arthroplasty 06/23/2017   Pulmonary hypertension associated with sarcoidosis (HCC) 02/03/2017   Lower extremity edema 11/28/2016   Familial tremor 09/24/2016   Sarcoidosis    Hypertension    Allergic rhinitis due to pollen    Coronary artery disease    Aortic atherosclerosis (HCC)    GERD (gastroesophageal reflux disease)    Peripheral vascular disease (HCC)    Osteoarthritis of both knees    Obstructive sleep apnea    Neuropathy    Dyslipidemia 11/19/2013   Thyroid nodule 11/19/2013    Past Surgical History:  Procedure Laterality Date   ABDOMINAL HYSTERECTOMY     BIOPSY THYROID  2014   Dr Andee Poles   BREAST EXCISIONAL BIOPSY Left 1980s   surgical bx neg   BREAST EXCISIONAL BIOPSY Left 1980s   benign   CARDIOVASCULAR STRESS TEST  05/12/2012   with dobutamine---negative. Normal LV function   CORONARY ANGIOPLASTY  2006  ESOPHAGOGASTRODUODENOSCOPY (EGD) WITH PROPOFOL N/A 04/03/2020   Procedure: ESOPHAGOGASTRODUODENOSCOPY (EGD) WITH PROPOFOL;  Surgeon: [name], MD;  Location: [facility] ENDOSCOPY;  Service: Gastroenterology;  Laterality: N/A;   JOINT REPLACEMENT Left 2000   hip   JOINT REPLACEMENT Right 2007   hip   KNEE ARTHROPLASTY Right 06/23/2017   Procedure: COMPUTER ASSISTED TOTAL KNEE ARTHROPLASTY;  Surgeon: Donato Heinz, MD;  Location: [facility] ORS;  Service: Orthopedics;  Laterality: Right;    Prior to Admission medications   Medication Sig Start Date End Date Taking? Authorizing Provider   cetirizine (ZYRTEC) 10 MG tablet Take 1 tablet (10 mg total) by mouth daily. 06/16/21  Yes Mckenzee Beem, Delorise Royals, PA-C  meclizine (ANTIVERT) 12.5 MG tablet Take 1 tablet (12.5 mg total) by mouth 3 (three) times daily as needed for dizziness. 06/16/21  Yes Geovany Trudo, Delorise Royals, PA-C  predniSONE (DELTASONE) 50 MG tablet Take 1 tablet (50 mg total) by mouth daily with breakfast. 06/16/21  Yes Darya Bigler, Delorise Royals, PA-C  albuterol (VENTOLIN HFA) 108 (90 Base) MCG/ACT inhaler Inhale 2 puffs into the lungs every 6 (six) hours as needed for wheezing or shortness of breath. 11/20/20   Alford Highland, MD  aspirin EC 81 MG tablet Take 81 mg by mouth daily.     [provider]  benzonatate (TESSALON PERLES) 100 MG capsule Take 1 capsule (100 mg total) by mouth 3 (three) times daily as needed for cough. Patient not taking: Reported on 03/20/2021 03/13/21   [name], MD  carbidopa-levodopa (SINEMET IR) 25-100 MG tablet Take 1 tablet by mouth at bedtime.  Patient not taking: Reported on 03/20/2021    [provider]  clopidogrel (PLAVIX) 75 MG tablet Take 1 tablet (75 mg total) by mouth daily. Patient not taking: Reported on 03/20/2021 11/20/20   Alford Highland, MD  fluticasone [facility]) 50 MCG/ACT nasal spray Place 2 sprays into both nostrils daily. Patient not taking: Reported on 03/20/2021 11/20/20   Alford Highland, MD  furosemide (LASIX) 20 MG tablet Take 1 tablet (20 mg total) by mouth daily. Patient not taking: Reported on 03/20/2021 11/20/20   Alford Highland, MD  gabapentin (NEURONTIN) 100 MG capsule Take by mouth. Patient not taking: No sig reported 12/11/20   [provider]  irbesartan (AVAPRO) 75 MG tablet Take 1 tablet (75 mg total) by mouth daily. Patient not taking: Reported on 03/20/2021 11/20/20   Alford Highland, MD  isosorbide mononitrate (IMDUR) 30 MG 24 hr tablet Take 1 tablet (30 mg total) by mouth daily. Patient not taking: No sig reported 11/20/20    Alford Highland, MD  latanoprost (XALATAN) 0.005 % ophthalmic solution Place 1 drop into both eyes at bedtime. Patient not taking: Reported on 03/20/2021 05/30/17   [provider]  metoprolol succinate (TOPROL-XL) 100 MG 24 hr tablet TAKE 1 TABLET BY MOUTH EVERY DAY 04/26/21   [name], MD  nitroGLYCERIN (NITROSTAT) 0.4 MG SL tablet Place 1 tablet (0.4 mg total) under the tongue every 5 (five) minutes as needed for chest pain. Patient not taking: No sig reported 11/20/20   Alford Highland, MD  olmesartan-hydrochlorothiazide (BENICAR HCT) 20-12.5 MG tablet Take 1 tablet by mouth daily.    [provider]  pantoprazole (PROTONIX) 40 MG tablet Take 1 tablet (40 mg total) by mouth daily. 03/13/21   [name], MD  polyethylene glycol (MIRALAX / GLYCOLAX) 17 g packet Take 17 g by mouth daily as needed for severe constipation. Patient not taking: No sig reported 11/20/20   Alford Highland,  MD    Allergies Tramadol, Sulfa antibiotics, and Zocor [simvastatin]  Family History  Problem Relation Age of Onset   Cancer Sister 18       unknown - groin area   Heart attack Brother    Cancer Sister 6       pancreatic    Heart attack Sister    Heart attack Brother     Social History Social History   Tobacco Use   Smoking status: Never   Smokeless tobacco: Never  Vaping Use   Vaping Use: Never used  Substance Use Topics   Alcohol use: No   Drug use: No     Review of Systems  Constitutional: No fever/chills.  Positive for vague dizziness. Eyes: No visual changes. No discharge ENT: No upper respiratory complaints. Cardiovascular: no chest pain. Respiratory: no cough. No SOB. Gastrointestinal: No abdominal pain.  No nausea, no vomiting.  No diarrhea.  No constipation. Genitourinary: Negative for dysuria. No hematuria Musculoskeletal: Negative for musculoskeletal pain. Skin: Negative for rash, abrasions, lacerations, ecchymosis. Neurological: Negative for  headaches, focal weakness or numbness.  10 System ROS otherwise negative.  ____________________________________________   PHYSICAL EXAM:  VITAL SIGNS: ED Triage Vitals  Enc Vitals Group     BP 06/16/21 1202 (!) 155/73     Pulse Rate 06/16/21 1202 65     Resp 06/16/21 1202 18     Temp 06/16/21 1202 98.3 F (36.8 C)     Temp Source 06/16/21 1202 Oral     SpO2 06/16/21 1202 98 %     Weight 06/16/21 1203 208 lb (94.3 kg)     Height 06/16/21 1203 5\' 4"  (1.626 m)     Head Circumference --      Peak Flow --      Pain Score 06/16/21 1202 2     Pain Loc --      Pain Edu? --      Excl. in GC? --      Constitutional: Alert and oriented. Well appearing and in no acute distress. Eyes: Conjunctivae are normal. PERRL. EOMI. Head: Atraumatic. ENT:      Ears: EACs unremarkable bilaterally.  TM is mildly retracted on the right and bulging on the left.  No injection.      Nose: Moderate clear congestion/rhinnorhea.      Mouth/Throat: Mucous membranes are moist.  Oropharynx is nonerythematous and nonedematous.  Uvula is midline. Neck: No stridor.   Hematological/Lymphatic/Immunilogical: No cervical lymphadenopathy. Cardiovascular: Normal rate, regular rhythm. Normal S1 and S2.  Good peripheral circulation. Respiratory: Normal respiratory effort without tachypnea or retractions. Lungs CTAB. Good air entry to the bases with no decreased or absent breath sounds. Gastrointestinal: Bowel sounds 4 quadrants. Soft and nontender to palpation. No guarding or rigidity. No palpable masses. No distention. No CVA tenderness. Musculoskeletal: Full range of motion to all extremities. No gross deformities appreciated. Neurologic:  Normal speech and language. No gross focal neurologic deficits are appreciated.  Cranial nerves II through XII grossly intact.  Negative Romberg's and pronator drift. Skin:  Skin is warm, dry and intact. No rash noted. Psychiatric: Mood and affect are normal. Speech and behavior  are normal. Patient exhibits appropriate insight and judgement.   ____________________________________________   LABS (all labs ordered are listed, but only abnormal results are displayed)  Labs Reviewed  CBC WITH DIFFERENTIAL/PLATELET - Abnormal; Notable for the following components:      Result Value   RBC 3.48 (*)    Hemoglobin 11.0 (*)  HCT 33.2 (*)    All other components within normal limits  COMPREHENSIVE METABOLIC PANEL - Abnormal; Notable for the following components:   Glucose, Bld 153 (*)    BUN 24 (*)    Creatinine, Ser 1.34 (*)    Calcium 8.8 (*)    GFR, Estimated 37 (*)    All other components within normal limits  URINALYSIS, COMPLETE (UACMP) WITH MICROSCOPIC - Abnormal; Notable for the following components:   Color, Urine YELLOW (*)    APPearance CLEAR (*)    Leukocytes,Ua MODERATE (*)    Bacteria, UA RARE (*)    All other components within normal limits  TROPONIN I (HIGH SENSITIVITY)   ____________________________________________  EKG   ____________________________________________  RADIOLOGY I personally viewed and evaluated these images as part of my medical decision making, as well as reviewing the written report by the radiologist.  ED Provider Interpretation: No acute intracranial abnormality on CT scan.  CT Head Wo Contrast  Result Date: 06/16/2021 CLINICAL DATA:  Dizziness for 3 weeks EXAM: CT HEAD WITHOUT CONTRAST TECHNIQUE: Contiguous axial images were obtained from the base of the skull through the vertex without intravenous contrast. COMPARISON:  12/13/2018 FINDINGS: Brain: No evidence of acute infarction, hemorrhage, hydrocephalus, extra-axial collection or mass lesion/mass effect. Mild low-density changes within the periventricular and subcortical white matter compatible with chronic microvascular ischemic change. Mild diffuse cerebral volume loss. Vascular: Atherosclerotic calcifications involving the large vessels of the skull base. No  unexpected hyperdense vessel. Skull: Normal. Negative for fracture or focal lesion. Sinuses/Orbits: No acute finding. Other: None. IMPRESSION: 1. No acute intracranial findings. 2. Chronic microvascular ischemic change and cerebral volume loss. Electronically Signed   By: Duanne Guess D.O.   On: 06/16/2021 13:56    ____________________________________________    PROCEDURES  Procedure(s) performed:    Procedures    Medications - No data to display   ____________________________________________   INITIAL IMPRESSION / ASSESSMENT AND PLAN / ED COURSE  Pertinent labs & imaging results that were available during my care of the patient were reviewed by me and considered in my medical decision making (see chart for details).  Review of the [place] CSRS was performed in accordance of the [facility] prior to dispensing any controlled drugs.           Patient's diagnosis is consistent with vertigo.  Patient presented to the emergency department complaining of dizziness.  She was unable to specifically describe the dizziness other than she felt off balance.  Patient had developed a mild headache today, denied any difficulty forming thoughts or words.  Mentation was at baseline according to the family.  No unilateral weakness.  Exam was reassuring with no neurodeficits.  Patient has equal grip strength bilaterally.  Physical exam was reassuring though there was findings of retracted TM on the right and bulging TM to the left.  This increases my suspicion for vertigo but labs and CT were obtained to further evaluate this dizziness.  Labs are reassuring.  Patient CT scan is reassuring at this time.  I will treat the patient symptomatically with Zyrtec to add to her Flonase, short burst of steroid for eustachian tube dysfunction and a low-dose of meclizine.  I have cautioned the patient's family member about the use of meclizine and needing somebody with the patient if she is going to take the meclizine.   The daughter verbalizes understanding of this plan.  Concerning signs and symptoms are discussed with the patient and her family member.  Follow-up with  ENT as needed. Patient is given ED precautions to return to the ED for any worsening or new symptoms.     ____________________________________________  FINAL CLINICAL IMPRESSION(S) / ED DIAGNOSES  Final diagnoses:  Vertigo      NEW MEDICATIONS STARTED DURING THIS VISIT:  ED Discharge Orders          Ordered    predniSONE (DELTASONE) 50 MG tablet  Daily with breakfast        06/16/21 1434    cetirizine (ZYRTEC) 10 MG tablet  Daily        06/16/21 1434    meclizine (ANTIVERT) 12.5 MG tablet  3 times daily PRN        06/16/21 1434                This chart was dictated using voice recognition software/Dragon. Despite best efforts to proofread, errors can occur which can change the meaning. Any change was purely unintentional.    Racheal PatchesCuthriell, Vincie Linn D, PA-C 06/16/21 1434    Dionne BucySiadecki, Sebastian, MD 06/16/21 [phone]

## 2021-06-16 NOTE — ED Notes (Signed)
Discharge instructions reviewed with pt, pt calm , collective , denied pain or sob.  

## 2021-06-27 ENCOUNTER — Other Ambulatory Visit: Payer: Self-pay | Admitting: Internal Medicine

## 2021-07-09 ENCOUNTER — Other Ambulatory Visit: Payer: Self-pay | Admitting: Otolaryngology

## 2021-07-09 DIAGNOSIS — R1310 Dysphagia, unspecified: Secondary | ICD-10-CM

## 2021-07-12 DIAGNOSIS — H40153 Residual stage of open-angle glaucoma, bilateral: Secondary | ICD-10-CM | POA: Diagnosis not present

## 2021-07-16 ENCOUNTER — Other Ambulatory Visit: Payer: Self-pay

## 2021-07-16 ENCOUNTER — Ambulatory Visit
Admission: RE | Admit: 2021-07-16 | Discharge: 2021-07-16 | Disposition: A | Discharge status: Home / Self Care | Payer: PPO | Source: Ambulatory Visit | Attending: Otolaryngology | Admitting: Otolaryngology

## 2021-07-16 DIAGNOSIS — R1310 Dysphagia, unspecified: Secondary | ICD-10-CM | POA: Insufficient documentation

## 2021-07-16 NOTE — Therapy (Signed)
[facility] DIAGNOSTIC RADIOLOGY [address] [place], [place], [address] Phone: [phone]   Fax:     Modified Barium Swallow  Patient Details  Name: Sheila Long MRN: [id number] Date of Birth: November 07, 1924 No data recorded  Encounter Date: 07/16/2021   End of Session - 07/16/21 1342     Visit Number 1    Number of Visits 1    Date for SLP Re-Evaluation 07/16/21    SLP Start Time 1220    SLP Stop Time  1250    SLP Time Calculation (min) 30 min    Activity Tolerance Patient tolerated treatment well               Subjective Assessment - 07/16/21 1318     Subjective Reports difficulty with mucus early in the morning, occasionally sticking sensation with meats/bread    Currently in Pain? No/denies             Objective Swallowing Evaluation: Type of Study: MBS-Modified Barium Swallow Study   Patient Details  Name: Sheila Long MRN: [id number] Date of Birth: 09-13-1924  Today's Date: 07/16/2021 Time: SLP Start Time (ACUTE ONLY): 1220 -SLP Stop Time (ACUTE ONLY): 1250  SLP Time Calculation (min) (ACUTE ONLY): 30 min   Past Medical History:  Past Medical History:  Diagnosis Date   Allergic rhinitis due to pollen    Anxiety    Aortic atherosclerosis (HCC)    CHF (congestive heart failure) (HCC)    Coronary artery disease    Stent in 2000's (DC)   Dyspnea    GERD (gastroesophageal reflux disease)    Hard of hearing    Hypertension    Neuropathy    Obstructive sleep apnea    Osteoarthritis of both knees    Peripheral vascular disease (HCC)    Sarcoidosis    Thyroid nodule    Past Surgical History:  Past Surgical History:  Procedure Laterality Date   ABDOMINAL HYSTERECTOMY     BIOPSY THYROID  2014   Dr Andee Poles   BREAST EXCISIONAL BIOPSY Left 1980s   surgical bx neg   BREAST EXCISIONAL BIOPSY Left 1980s   benign   CARDIOVASCULAR STRESS TEST  05/12/2012   with dobutamine---negative. Normal LV  function   CORONARY ANGIOPLASTY  2006   ESOPHAGOGASTRODUODENOSCOPY (EGD) WITH PROPOFOL N/A 04/03/2020   Procedure: ESOPHAGOGASTRODUODENOSCOPY (EGD) WITH PROPOFOL;  Surgeon: [name], MD;  Location: [facility] ENDOSCOPY;  Service: Gastroenterology;  Laterality: N/A;   JOINT REPLACEMENT Left 2000   hip   JOINT REPLACEMENT Right 2007   hip   KNEE ARTHROPLASTY Right 06/23/2017   Procedure: COMPUTER ASSISTED TOTAL KNEE ARTHROPLASTY;  Surgeon: Donato Heinz, MD;  Location: [facility] ORS;  Service: Orthopedics;  Laterality: Right;   HPI: Patient is a 87 y.o. female with past medical history of GERD/LPR, sarcoidosis, HTN, PVD, hiatal hernia, OSA, NSTEMI, CHF, anxiety, CKD, esophageal stricture referred by Dr. Andee Poles for MBS. Patient reports difficulty with solids vs liquids, such as tough meats, breads. Admitted at [facility] 04/03/20 for food impaction/dysphagia during which GI was consulted; EGD was performed with finding of benign stricture at GE junction; Dr. Tobi Bastos did not dilate at the time due to moderate to severe esophagitis. Has since had follow-up with GI with recommendation for dilation, however pt has not pursued this yet.   No data recorded   Assessment / Plan / Recommendation  CHL IP CLINICAL IMPRESSIONS 07/16/2021  Clinical Impression Patient presents with functional oropharyngeal swallow with no penetration or  aspiration observed. Oral stage is characterized by adequate lip closure, oral containment, rotary mastication and anterior to posterior transfer. Mild lingual residue with puree which pt senses and clears with subsequent swallow; pt had difficulty with oral transit of 93mm barium tablet with water; she was able to transit whole in applesauce without difficulty. Swallow initiation is timely for age at the level of the valleculae. Pharyngeal stage is characterized by adequate tongue base retraction, hyolaryngeal excursion, and pharyngeal constriction with no abnormal residue noted in the pharynx.  Epiglottic deflection is complete; there is no penetration or aspiration of any consistency. Appearance of mildly prominent cricopharyngeus which does not impede bolus flow or clearance. There was adequate/complete clearance through the cervical esophagus. Consistencies tested were thin liquids x3 cup sips, nectar x1 cup sip, puree x2, mechanical soft (1/4 graham cracker in applesauce x2), 13 mm barium tablet with 1 tsp puree. Recommend patient continue regular diet with thin liquids; no further ST indicated at this time. Patient reported globus sensation at level of hyoid with graham cracker, however there was no pharyngeal residue correlating with this sensation. As pt has history of GERD/LPR, SLP educated on reflux modifications and provided handout with this information. Should pt desire further work-up or to follow up with dilation as recommended by GI, GI follow-up may be appropriate. Recommend patient continue regular diet with thin liquids; no further ST indicated at this time.  SLP Visit Diagnosis Dysphagia, oropharyngeal phase (R13.12)  Impact on safety and function Mild aspiration risk      CHL IP TREATMENT RECOMMENDATION 07/16/2021  Treatment Recommendations No treatment recommended at this time     No flowsheet data found.  CHL IP DIET RECOMMENDATION 07/16/2021  SLP Diet Recommendations Regular solids;Thin liquid  Liquid Administration via Cup  Medication Administration Whole meds with liquid  Compensations Slow rate;Small sips/bites;Follow solids with liquid  Postural Changes Seated upright at 90 degrees;Remain semi-upright after after feeds/meals (Comment)      CHL IP OTHER RECOMMENDATIONS 07/16/2021  Recommended Consults Consider GI evaluation  Oral Care Recommendations Oral care BID  Other Recommendations --      CHL IP FOLLOW UP RECOMMENDATIONS 07/16/2021  Follow up Recommendations None      No flowsheet data found.         CHL IP ORAL PHASE 07/16/2021  Oral Phase WFL   Oral - Nectar Cup WFL  Oral - Thin Cup WFL  Oral - Puree WFL;Lingual/palatal residue  Oral - Mech Soft WFL  Oral - Pill Decreased bolus cohesion    CHL IP PHARYNGEAL PHASE 07/16/2021  Pharyngeal Phase WFL  Pharyngeal- Nectar Cup [facility]  Pharyngeal Material does not enter airway  Pharyngeal- Thin Cup [facility]  Pharyngeal Material does not enter airway  Pharyngeal- Puree WFL  Pharyngeal Material does not enter airway  Pharyngeal- Mechanical Soft WFL  Pharyngeal Material does not enter airway     CHL IP CERVICAL ESOPHAGEAL PHASE 07/16/2021  Cervical Esophageal Phase [facility]   Rondel Baton, MS, CCC-SLP Speech-Language Pathologist   Arlana Lindau 07/16/2021, 1:44 PM     Dysphagia, unspecified type - Plan: DG SWALLOW FUNC OP MEDICARE SPEECH PATH, DG SWALLOW FUNC OP MEDICARE SPEECH PATH        Problem List Patient Active Problem List   Diagnosis Date Noted   Dizziness 01/15/2021   Carpal tunnel syndrome 12/11/2020   PND (post-nasal drip)    Stenosis of right carotid artery    Acute kidney injury superimposed on CKD (HCC)  NSTEMI (non-ST elevated myocardial infarction) (HCC) 11/18/2020   [name] 11/17/2020   Back pain without sciatica 11/07/2020   Numbness in both hands 11/07/2020   Numbness 10/05/2019   Axillary abscess 11/12/2018   Status post total replacement of both hips 10/04/2018   Prediabetes 10/30/2017   Acute on chronic diastolic CHF (congestive heart failure) (HCC) 10/29/2017   Anxiety 10/29/2017   Fatigue 07/29/2017   Mood disorder (HCC) 07/29/2017   Constipation 06/26/2017   S/P total knee arthroplasty 06/23/2017   Pulmonary hypertension associated with sarcoidosis (HCC) 02/03/2017   Lower extremity edema 11/28/2016   Familial tremor 09/24/2016   Sarcoidosis    Hypertension    Allergic rhinitis due to pollen    Coronary artery disease    Aortic atherosclerosis (HCC)    GERD (gastroesophageal reflux disease)    Peripheral vascular disease  (HCC)    Osteoarthritis of both knees    Obstructive sleep apnea    Neuropathy    Dyslipidemia 11/19/2013   Thyroid nodule 11/19/2013    Arlana Lindau 07/16/2021, 1:43 PM  [facility] DIAGNOSTIC RADIOLOGY [address] [place], [place], [address] Phone: [phone]   Fax:     Name: Sheila Long MRN: [id number] Date of Birth: 05-26-1924

## 2021-08-06 DIAGNOSIS — I10 Essential (primary) hypertension: Secondary | ICD-10-CM | POA: Diagnosis not present

## 2021-08-06 DIAGNOSIS — I739 Peripheral vascular disease, unspecified: Secondary | ICD-10-CM | POA: Diagnosis not present

## 2021-08-06 DIAGNOSIS — M545 Low back pain, unspecified: Secondary | ICD-10-CM | POA: Diagnosis not present

## 2021-08-06 DIAGNOSIS — N1832 Chronic kidney disease, stage 3b: Secondary | ICD-10-CM | POA: Diagnosis not present

## 2021-08-06 DIAGNOSIS — J42 Unspecified chronic bronchitis: Secondary | ICD-10-CM | POA: Diagnosis not present

## 2021-08-06 DIAGNOSIS — M4807 Spinal stenosis, lumbosacral region: Secondary | ICD-10-CM | POA: Diagnosis not present

## 2021-08-08 DIAGNOSIS — M5136 Other intervertebral disc degeneration, lumbar region: Secondary | ICD-10-CM | POA: Diagnosis not present

## 2021-08-08 DIAGNOSIS — M48062 Spinal stenosis, lumbar region with neurogenic claudication: Secondary | ICD-10-CM | POA: Diagnosis not present

## 2021-09-11 ENCOUNTER — Ambulatory Visit (INDEPENDENT_AMBULATORY_CARE_PROVIDER_SITE_OTHER): Payer: PPO | Admitting: Vascular Surgery

## 2021-09-11 ENCOUNTER — Encounter (INDEPENDENT_AMBULATORY_CARE_PROVIDER_SITE_OTHER): Payer: PPO

## 2021-10-02 ENCOUNTER — Ambulatory Visit (INDEPENDENT_AMBULATORY_CARE_PROVIDER_SITE_OTHER): Payer: PPO | Admitting: Vascular Surgery

## 2021-10-02 ENCOUNTER — Ambulatory Visit (INDEPENDENT_AMBULATORY_CARE_PROVIDER_SITE_OTHER): Payer: PPO

## 2021-10-02 DIAGNOSIS — I739 Peripheral vascular disease, unspecified: Secondary | ICD-10-CM

## 2021-10-11 ENCOUNTER — Ambulatory Visit (INDEPENDENT_AMBULATORY_CARE_PROVIDER_SITE_OTHER): Payer: PPO | Admitting: Internal Medicine

## 2021-10-11 DIAGNOSIS — Z0000 Encounter for general adult medical examination without abnormal findings: Secondary | ICD-10-CM | POA: Diagnosis not present

## 2021-10-11 NOTE — Progress Notes (Signed)
Subjective:   Sheila Long is a 87 y.o. female who presents for an Initial Medicare Annual Wellness Visit.  I discussed the limitations of evaluation and management by telemedicine and the availability of in person appointments. Patient expressed understanding and agreed to proceed.   Visit performed using audio  Patient:home Provider:home   Review of Systems    Defer to provider  Cardiac Risk Factors include: advanced age (>47men, >33 women);hypertension     Objective:    There were no vitals filed for this visit. There is no height or weight on file to calculate BMI.  Advanced Directives 10/11/2021 06/16/2021 01/08/2021 11/17/2020 07/08/2020 04/03/2020 04/03/2020  Does Patient Have a Medical Advance Directive? No Yes Yes Unable to assess, patient is non-responsive or altered mental status No No No  Type of Advance Directive - Healthcare Power of Attorney Living will;Healthcare Power of Attorney - - - -  Does patient want to make changes to medical advance directive? - - No - Patient declined - - - -  Would patient like information on creating a medical advance directive? No - Patient declined - - - No - Patient declined - No - Patient declined    Current Medications (verified) Outpatient Encounter Medications as of 10/11/2021  Medication Sig   albuterol (VENTOLIN HFA) 108 (90 Base) MCG/ACT inhaler Inhale 2 puffs into the lungs every 6 (six) hours as needed for wheezing or shortness of breath.   aspirin EC 81 MG tablet Take 81 mg by mouth daily.    benzonatate (TESSALON) 100 MG capsule TAKE 1 CAPSULE BY MOUTH THREE TIMES A DAY AS NEEDED FOR COUGH   carbidopa-levodopa (SINEMET IR) 25-100 MG tablet Take 1 tablet by mouth at bedtime.  (Patient not taking: Reported on 03/20/2021)   cetirizine (ZYRTEC) 10 MG tablet Take 1 tablet (10 mg total) by mouth daily.   clopidogrel (PLAVIX) 75 MG tablet Take 1 tablet (75 mg total) by mouth daily. (Patient not taking: Reported on 03/20/2021)    fluticasone (FLONASE) 50 MCG/ACT nasal spray Place 2 sprays into both nostrils daily. (Patient not taking: Reported on 03/20/2021)   furosemide (LASIX) 20 MG tablet Take 1 tablet (20 mg total) by mouth daily. (Patient not taking: Reported on 03/20/2021)   gabapentin (NEURONTIN) 100 MG capsule Take by mouth. (Patient not taking: No sig reported)   irbesartan (AVAPRO) 75 MG tablet Take 1 tablet (75 mg total) by mouth daily. (Patient not taking: Reported on 03/20/2021)   isosorbide mononitrate (IMDUR) 30 MG 24 hr tablet Take 1 tablet (30 mg total) by mouth daily. (Patient not taking: No sig reported)   latanoprost (XALATAN) 0.005 % ophthalmic solution Place 1 drop into both eyes at bedtime. (Patient not taking: Reported on 03/20/2021)   meclizine (ANTIVERT) 12.5 MG tablet Take 1 tablet (12.5 mg total) by mouth 3 (three) times daily as needed for dizziness.   metoprolol succinate (TOPROL-XL) 100 MG 24 hr tablet TAKE 1 TABLET BY MOUTH EVERY DAY   nitroGLYCERIN (NITROSTAT) 0.4 MG SL tablet Place 1 tablet (0.4 mg total) under the tongue every 5 (five) minutes as needed for chest pain. (Patient not taking: No sig reported)   olmesartan-hydrochlorothiazide (BENICAR HCT) 20-12.5 MG tablet Take 1 tablet by mouth daily.   pantoprazole (PROTONIX) 40 MG tablet Take 1 tablet (40 mg total) by mouth daily.   polyethylene glycol (MIRALAX / GLYCOLAX) 17 g packet Take 17 g by mouth daily as needed for severe constipation. (Patient not taking: No sig reported)  predniSONE (DELTASONE) 50 MG tablet Take 1 tablet (50 mg total) by mouth daily with breakfast.   No facility-administered encounter medications on file as of 10/11/2021.    Allergies (verified) Tramadol, Sulfa antibiotics, and Zocor [simvastatin]   History: Past Medical History:  Diagnosis Date   Allergic rhinitis due to pollen    Anxiety    Aortic atherosclerosis (HCC)    CHF (congestive heart failure) (HCC)    Coronary artery disease    Stent in  2000's ([facility])   Dyspnea    GERD (gastroesophageal reflux disease)    Hard of hearing    Hypertension    Neuropathy    Obstructive sleep apnea    Osteoarthritis of both knees    Peripheral vascular disease (HCC)    Sarcoidosis    Thyroid nodule    Past Surgical History:  Procedure Laterality Date   ABDOMINAL HYSTERECTOMY     BIOPSY THYROID  2014   Dr Andee Poles   BREAST EXCISIONAL BIOPSY Left 1980s   surgical bx neg   BREAST EXCISIONAL BIOPSY Left 1980s   benign   CARDIOVASCULAR STRESS TEST  05/12/2012   with dobutamine---negative. Normal LV function   CORONARY ANGIOPLASTY  2006   ESOPHAGOGASTRODUODENOSCOPY (EGD) WITH PROPOFOL N/A 04/03/2020   Procedure: ESOPHAGOGASTRODUODENOSCOPY (EGD) WITH PROPOFOL;  Surgeon: [name], MD;  Location: [facility] ENDOSCOPY;  Service: Gastroenterology;  Laterality: N/A;   JOINT REPLACEMENT Left 2000   hip   JOINT REPLACEMENT Right 2007   hip   KNEE ARTHROPLASTY Right 06/23/2017   Procedure: COMPUTER ASSISTED TOTAL KNEE ARTHROPLASTY;  Surgeon: Donato Heinz, MD;  Location: [facility] ORS;  Service: Orthopedics;  Laterality: Right;   Family History  Problem Relation Age of Onset   Cancer Sister 33       unknown - groin area   Heart attack Brother    Cancer Sister 80       pancreatic    Heart attack Sister    Heart attack Brother    Social History   Socioeconomic History   Marital status: Widowed    Spouse name: Not on file   Number of children: 0   Years of education: Not on file   Highest education level: Not on file  Occupational History   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  Tobacco Use   Smoking status: Never   Smokeless tobacco: Never  Vaping Use   Vaping Use: Never used  Substance and Sexual Activity   Alcohol use: No   Drug use: No   Sexual activity: Not Currently    Birth control/protection: Post-menopausal  Other Topics Concern   Not on file  Social History Narrative   Past living will   Needs to redo  health care POA   Requests sister [employer] as decision makers   Would accept resuscitation   Not sure about tube feeds   Social Determinants of Health   Financial Resource Strain: Medium Risk   Difficulty of Paying Living Expenses: Somewhat hard  Food Insecurity: No Food Insecurity   Worried About Programme researcher, broadcasting/film/video in the Last Year: Never true   Ran Out of Food in the Last Year: Never true  Transportation Needs: No Transportation Needs   Lack of Transportation (Medical): No   Lack of Transportation (Non-Medical): No  Physical Activity: Inactive   Days of Exercise per Week: 0 days   Minutes of Exercise per Session: 0 min  Stress: No Stress Concern Present  Feeling of Stress : Only a little  Social Connections: Moderately Integrated   Frequency of Communication with Friends and Family: More than three times a week   Frequency of Social Gatherings with Friends and Family: More than three times a week   Attends Religious Services: More than 4 times per year   Active Member of [employer] or Organizations: Yes   Attends Banker Meetings: More than 4 times per year   Marital Status: Widowed    Tobacco Counseling Counseling given: Not Answered   Clinical Intake:  Pre-visit preparation completed: Yes  Pain : No/denies pain     Nutritional Risks: None Diabetes: No  How often do you need to have someone help you when you read instructions, pamphlets, or other written materials from your doctor or pharmacy?: 1 - Never What is the last grade level you completed in school?: 11th  Diabetic?No  Interpreter Needed?: No  Information entered by :: Melody Comas, CMA   Activities of Daily Living In your present state of health, do you have any difficulty performing the following activities: 10/11/2021 11/18/2020  Hearing? Malvin Johns  Vision? Y N  Difficulty concentrating or making decisions? N N  Walking or climbing stairs? N N  Dressing or bathing? N N  Doing  errands, shopping? Y N  Preparing Food and eating ? N -  Using the Toilet? N -  In the past six months, have you accidently leaked urine? N -  Do you have problems with loss of bowel control? N -  Managing your Medications? N -  Managing your Finances? N -  Housekeeping or managing your Housekeeping? N -  Some recent data might be hidden    Patient Care Team: [name], MD as PCP - General (Internal Medicine) Kieth Brightly, MD (General Surgery) Lyndon Code, MD (Internal Medicine)  Indicate any recent Medical Services you may have received from other than [facility] providers in the past year (date may be approximate).     Assessment:   This is a routine wellness examination for [place].  Hearing/Vision screen No results found.  Dietary issues and exercise activities discussed: Current Exercise Habits: The patient does not participate in regular exercise at present, Exercise limited by: respiratory conditions(s);orthopedic condition(s)   Goals Addressed   None    Depression Screen PHQ 2/9 Scores 10/11/2021 10/11/2021 06/08/2018 11/24/2017 10/27/2017 02/03/2017  PHQ - 2 Score 0 0 0 0 0 0    Fall Risk Fall Risk  10/11/2021 01/08/2021 11/24/2019 11/12/2018 06/08/2018  Falls in the past year? 1 0 0 0 No  Comment - - Emmi Telephone Survey: data to providers prior to load - -  Number falls in past yr: 0 0 - - -  Injury with Fall? 0 0 - - -  Risk for fall due to : No Fall Risks No Fall Risks;Impaired mobility - - -  Follow up Falls evaluation completed Falls evaluation completed;Education provided;Falls prevention discussed - Falls evaluation completed -    FALL RISK PREVENTION PERTAINING TO THE HOME:  Any stairs in or around the home? No  If so, are there any without handrails? No  Home free of loose throw rugs in walkways, pet beds, electrical cords, etc? Yes  Adequate lighting in your home to reduce risk of falls? Yes   ASSISTIVE DEVICES UTILIZED TO PREVENT  FALLS:  Life alert? No  Use of a cane, walker or w/c? Yes  Grab bars in the bathroom? Yes  Shower chair  or bench in shower? Yes  Elevated toilet seat or a handicapped toilet? No   TIMED UP AND GO:  Was the test performed? No .  Length of time to ambulate- NA  Gait slow and steady with assistive device  Cognitive Function: MMSE - Mini Mental State Exam 10/11/2021  Not completed: Unable to complete     6CIT Screen 10/11/2021  What Year? 0 points  What month? 0 points  What time? 3 points  Count back from 20 0 points  Months in reverse 2 points  Repeat phrase 0 points  Total Score 5    Immunizations Immunization History  Administered Date(s) Administered   Moderna Sars-Covid-2 Vaccination 02/11/2020, 03/13/2020, 12/08/2020   Pneumococcal Conjugate-13 09/24/2016    TDAP status: Due, Education has been provided regarding the importance of this vaccine. Advised may receive this vaccine at local pharmacy or Health Dept. Aware to provide a copy of the vaccination record if obtained from local pharmacy or Health Dept. Verbalized acceptance and understanding.  Flu Vaccine status: Due, Education has been provided regarding the importance of this vaccine. Advised may receive this vaccine at local pharmacy or Health Dept. Aware to provide a copy of the vaccination record if obtained from local pharmacy or Health Dept. Verbalized acceptance and understanding.  Pneumococcal vaccine status: Up to date  Covid-19 vaccine status: Completed vaccines  Qualifies for Shingles Vaccine? No   Zostavax completed No   Shingrix Completed?: No.    Education has been provided regarding the importance of this vaccine. Patient has been advised to call insurance company to determine out of pocket expense if they have not yet received this vaccine. Advised may also receive vaccine at local pharmacy or Health Dept. Verbalized acceptance and understanding.  Screening Tests Health Maintenance  Topic  Date Due   TETANUS/TDAP  Never done   Zoster Vaccines- Shingrix (1 of 2) Never done   DEXA SCAN  Never done   COVID-19 Vaccine (4 - Booster for Moderna series) 04/08/2021   INFLUENZA VACCINE  Never done   HPV VACCINES  Aged Out    Health Maintenance  Health Maintenance Due  Topic Date Due   TETANUS/TDAP  Never done   Zoster Vaccines- Shingrix (1 of 2) Never done   DEXA SCAN  Never done   COVID-19 Vaccine (4 - Booster for Moderna series) 04/08/2021   INFLUENZA VACCINE  Never done    Colorectal cancer screening: No longer required.   Mammogram status: No longer required due to age.  Patient declines Bone density  Lung Cancer Screening: (Low Dose CT Chest recommended if Age 74-80 years, 30 pack-year currently smoking OR have quit w/in 15years.) does not qualify.   Lung Cancer Screening Referral: NA  Additional Screening:  Hepatitis C Screening: does not qualify; Completed No  Vision Screening: Recommended annual ophthalmology exams for early detection of glaucoma and other disorders of the eye. Is the patient up to date with their annual eye exam?  Yes  Who is the provider or what is the name of the office in which the patient attends annual eye exams? Dr Alvester Morin If pt is not established with a provider, would they like to be referred to a provider to establish care?  Patient already establsed  .   Dental Screening: Recommended annual dental exams for proper oral hygiene  Community Resource Referral / Chronic Care Management: CRR required this visit?  No   CCM required this visit?  No      Plan:  I have personally reviewed and noted the following in the patient's chart:   Medical and social history Use of alcohol, tobacco or illicit drugs  Current medications and supplements including opioid prescriptions. Patient is not currently taking opioid prescriptions. Functional ability and status Nutritional status Physical activity Advanced directives List of other  physicians Hospitalizations, surgeries, and ER visits in previous 12 months Vitals Screenings to include cognitive, depression, and falls Referrals and appointments  In addition, I have reviewed and discussed with patient certain preventive protocols, quality metrics, and best practice recommendations. A written personalized care plan for preventive services as well as general preventive health recommendations were provided to patient.     Melody Comas, CMA   10/11/2021   Nurse Notes:  Ms. Enterline , Thank you for taking time to come for your Medicare Wellness Visit. I appreciate your ongoing commitment to your health goals. Please review the following plan we discussed and let me know if I can assist you in the future.   These are the goals we discussed:  Goals   None     This is a list of the screening recommended for you and due dates:  Health Maintenance  Topic Date Due   Tetanus Vaccine  Never done   Zoster (Shingles) Vaccine (1 of 2) Never done   DEXA scan (bone density measurement)  Never done   COVID-19 Vaccine (4 - Booster for Moderna series) 04/08/2021   Flu Shot  Never done   HPV Vaccine  Aged Out       Time spent with patient 40 min

## 2021-10-12 NOTE — Progress Notes (Signed)
I have reviewed this visit and agree with the documentation.   

## 2021-10-19 ENCOUNTER — Encounter (INDEPENDENT_AMBULATORY_CARE_PROVIDER_SITE_OTHER): Payer: Self-pay | Admitting: *Deleted

## 2021-10-20 ENCOUNTER — Emergency Department: Payer: PPO

## 2021-10-20 ENCOUNTER — Emergency Department
Admission: EM | Admit: 2021-10-20 | Discharge: 2021-10-20 | Disposition: A | Discharge status: Home / Self Care | Payer: PPO | Attending: Emergency Medicine | Admitting: Emergency Medicine

## 2021-10-20 ENCOUNTER — Other Ambulatory Visit: Payer: Self-pay

## 2021-10-20 ENCOUNTER — Encounter: Payer: Self-pay | Admitting: Emergency Medicine

## 2021-10-20 DIAGNOSIS — I7121 Aneurysm of the ascending aorta, without rupture: Secondary | ICD-10-CM | POA: Diagnosis not present

## 2021-10-20 DIAGNOSIS — I5032 Chronic diastolic (congestive) heart failure: Secondary | ICD-10-CM | POA: Insufficient documentation

## 2021-10-20 DIAGNOSIS — Z7982 Long term (current) use of aspirin: Secondary | ICD-10-CM | POA: Insufficient documentation

## 2021-10-20 DIAGNOSIS — I2510 Atherosclerotic heart disease of native coronary artery without angina pectoris: Secondary | ICD-10-CM | POA: Diagnosis not present

## 2021-10-20 DIAGNOSIS — I110 Hypertensive heart disease with heart failure: Secondary | ICD-10-CM | POA: Insufficient documentation

## 2021-10-20 DIAGNOSIS — M7989 Other specified soft tissue disorders: Secondary | ICD-10-CM | POA: Diagnosis not present

## 2021-10-20 DIAGNOSIS — R0602 Shortness of breath: Secondary | ICD-10-CM | POA: Diagnosis not present

## 2021-10-20 DIAGNOSIS — Z20822 Contact with and (suspected) exposure to covid-19: Secondary | ICD-10-CM | POA: Diagnosis not present

## 2021-10-20 DIAGNOSIS — R0789 Other chest pain: Secondary | ICD-10-CM | POA: Diagnosis present

## 2021-10-20 DIAGNOSIS — I700 Atherosclerosis of aorta: Secondary | ICD-10-CM | POA: Diagnosis not present

## 2021-10-20 DIAGNOSIS — J40 Bronchitis, not specified as acute or chronic: Secondary | ICD-10-CM | POA: Diagnosis not present

## 2021-10-20 LAB — COMPREHENSIVE METABOLIC PANEL
ALT: <5 U/L (ref 0–44)
AST: 18 U/L (ref 15–41)
Albumin: 3.8 g/dL (ref 3.5–5.0)
Alkaline Phosphatase: 53 U/L (ref 38–126)
Anion gap: 10 (ref 5–15)
BUN: 27 mg/dL — ABNORMAL HIGH (ref 8–23)
CO2: 24 mmol/L (ref 22–32)
Calcium: 9 mg/dL (ref 8.9–10.3)
Chloride: 105 mmol/L (ref 98–111)
Creatinine, Ser: 1.58 mg/dL — ABNORMAL HIGH (ref 0.44–1.00)
GFR, Estimated: 30 mL/min — ABNORMAL LOW (ref >60)
Glucose, Bld: 167 mg/dL — ABNORMAL HIGH (ref 70–99)
Potassium: 4.2 mmol/L (ref 3.5–5.1)
Sodium: 139 mmol/L (ref 135–145)
Total Bilirubin: 0.8 mg/dL (ref 0.3–1.2)
Total Protein: 6.9 g/dL (ref 6.5–8.1)

## 2021-10-20 LAB — CBC
HCT: 29.8 % — ABNORMAL LOW (ref 36.0–46.0)
Hemoglobin: 9.6 g/dL — ABNORMAL LOW (ref 12.0–15.0)
MCH: 31.2 pg (ref 26.0–34.0)
MCHC: 32.2 g/dL (ref 30.0–36.0)
MCV: 96.8 fL (ref 80.0–100.0)
Platelets: 237 10*3/uL (ref 150–400)
RBC: 3.08 MIL/uL — ABNORMAL LOW (ref 3.87–5.11)
RDW: 15.9 % — ABNORMAL HIGH (ref 11.5–15.5)
WBC: 7.8 10*3/uL (ref 4.0–10.5)
nRBC: 0 % (ref 0.0–0.2)

## 2021-10-20 LAB — RESP PANEL BY RT-PCR (FLU A&B, COVID) ARPGX2
Influenza A by PCR: NEGATIVE
Influenza B by PCR: NEGATIVE
SARS Coronavirus 2 by RT PCR: NEGATIVE

## 2021-10-20 LAB — TROPONIN I (HIGH SENSITIVITY)
Troponin I (High Sensitivity): 5 ng/L (ref <18)
Troponin I (High Sensitivity): 6 ng/L (ref <18)

## 2021-10-20 LAB — BRAIN NATRIURETIC PEPTIDE: B Natriuretic Peptide: 90.2 pg/mL (ref 0.0–100.0)

## 2021-10-20 MED ORDER — AZITHROMYCIN 250 MG PO TABS: ORAL_TABLET | ORAL | 0 refills | Status: AC

## 2021-10-20 MED ORDER — METHYLPREDNISOLONE SODIUM SUCC 125 MG IJ SOLR
125.0000 mg | Freq: Once | INTRAMUSCULAR | Status: AC
  Administered 2021-10-20: 125 mg via INTRAVENOUS
  Filled 2021-10-20: qty 2

## 2021-10-20 MED ORDER — IPRATROPIUM-ALBUTEROL 0.5-2.5 (3) MG/3ML IN SOLN
3.0000 mL | Freq: Once | RESPIRATORY_TRACT | Status: AC
  Administered 2021-10-20: 3 mL via RESPIRATORY_TRACT
  Filled 2021-10-20: qty 3

## 2021-10-20 MED ORDER — SODIUM CHLORIDE 0.9 % IV BOLUS
250.0000 mL | Freq: Once | INTRAVENOUS | Status: AC
  Administered 2021-10-20: 250 mL via INTRAVENOUS

## 2021-10-20 MED ORDER — BENZONATATE 100 MG PO CAPS: 100.0000 mg | ORAL_CAPSULE | Freq: Three times a day (TID) | ORAL | 0 refills | Status: AC | PRN

## 2021-10-20 MED ORDER — PREDNISONE 20 MG PO TABS: 40.0000 mg | ORAL_TABLET | Freq: Every day | ORAL | 0 refills | Status: AC

## 2021-10-20 NOTE — ED Notes (Signed)
US at bedside

## 2021-10-20 NOTE — ED Triage Notes (Signed)
Pt via POV from home. Pt c/o mid sternal CP, SOB on exertion, cough, wheezing, and nausea for the past 2 weeks. Denies exposure to anyone with COVID. Pt is A&Ox4 and NAD.

## 2021-10-20 NOTE — Discharge Instructions (Addendum)
Discussed your CT scan with your primary care doctor to decide the next steps.  I suspect this is just like the bronchitis and you can take the steroids, antibiotics cough drops to help with your symptoms.  Please follow-up with pulmonary.  Your hemoglobin level is slightly lower than before and your stool was brown but was staining positive for blood so she can follow this up with your primary care doctor and have a recheck within 1 week of your hemoglobin.  We discussed admission versus going home and she felt more comfortable going home which I think is reasonable given your oxygen levels are normal however if you develop worsening shortness of breath or  any other concerns please return to the ER immediately   1. Cardiomegaly and coronary artery calcifications. 2. Ascending aortic aneurysm is 4.2 centimeters, slightly increased compared with previous exam from November 2021. Recommend annual imaging followup by CTA or MRA. This recommendation follows 2010 ACCF/AHA/AATS/ACR/ASA/SCA/SCAI/SIR/STS/SVM Guidelines for the Diagnosis and Management of Patients with Thoracic Aortic Disease. Circulation. 2010; 121: [id number]. Aortic aneurysm NOS (ICD10-I71.9) 3.  Aortic aneurysm NOS (ICD10-I71.9). 4. Aortic Atherosclerosis (ICD10-I70.0). 5. Moderate hiatal hernia. 6. 1.2 centimeter mass within the anterior UPPER OUTER QUADRANT of the LEFT breast. Recommend further evaluation with diagnostic mammogram and LEFT breast ultrasound.

## 2021-10-20 NOTE — ED Provider Notes (Signed)
[facility] Emergency Department Provider Note  ____________________________________________   Event Date/Time   First MD Initiated Contact with Patient 10/20/21 1504     (approximate)  I have reviewed the triage vital signs and the nursing notes.   HISTORY  Chief Complaint Chest Pain and [name]    HPI Sheila Long is a 87 y.o. female with hypertension, CHF, coronary disease who comes in with concerns for chest pain, shortness of breath.  Patient reports having 2 weeks of wheezing, coughing.  Has not been on medications to help with better, worse with exertion.  She states that because she has been coughing she has developed some chest pain.  Denies any chest pain right now but only had 1 episode when she was coughing a while ago she denies any fevers or COVID exposures, abdominal pain, falls, hitting her head.  She states however that she has noticed some more cramping in her legs and feeling like her left leg is larger than her right leg.  But it sound like that is been chronic in nature.  On review of records from her pulmonary doctor she has a history of chronic bronchitis, sarcoidosis.  It typically she is treated with a 5-day course of steroids and some azithromycin.  She has follow-up with pulmonary later this week.     Past Medical History:  Diagnosis Date   Allergic rhinitis due to pollen    Anxiety    Aortic atherosclerosis (HCC)    CHF (congestive heart failure) (HCC)    Coronary artery disease    Stent in 2000's (DC)   Dyspnea    GERD (gastroesophageal reflux disease)    Hard of hearing    Hypertension    Neuropathy    Obstructive sleep apnea    Osteoarthritis of both knees    Peripheral vascular disease (HCC)    Sarcoidosis    Thyroid nodule     Patient Active Problem List   Diagnosis Date Noted   Dizziness 01/15/2021   Carpal tunnel syndrome 12/11/2020   PND (post-nasal drip)    Stenosis of right carotid  artery    Acute kidney injury superimposed on CKD (HCC)    NSTEMI (non-ST elevated myocardial infarction) (HCC) 11/18/2020   [name] 11/17/2020   Back pain without sciatica 11/07/2020   Numbness in both hands 11/07/2020   Numbness 10/05/2019   Axillary abscess 11/12/2018   Status post total replacement of both hips 10/04/2018   Prediabetes 10/30/2017   Acute on chronic diastolic CHF (congestive heart failure) (HCC) 10/29/2017   Anxiety 10/29/2017   Fatigue 07/29/2017   Mood disorder (HCC) 07/29/2017   Constipation 06/26/2017   S/P total knee arthroplasty 06/23/2017   Pulmonary hypertension associated with sarcoidosis (HCC) 02/03/2017   Lower extremity edema 11/28/2016   Familial tremor 09/24/2016   Sarcoidosis    Hypertension    Allergic rhinitis due to pollen    Coronary artery disease    Aortic atherosclerosis (HCC)    GERD (gastroesophageal reflux disease)    Peripheral vascular disease (HCC)    Osteoarthritis of both knees    Obstructive sleep apnea    Neuropathy    Dyslipidemia 11/19/2013   Thyroid nodule 11/19/2013    Past Surgical History:  Procedure Laterality Date   ABDOMINAL HYSTERECTOMY     BIOPSY THYROID  2014   Dr Andee Poles   BREAST EXCISIONAL BIOPSY Left 1980s   surgical bx neg   BREAST EXCISIONAL BIOPSY Left 1980s  benign   CARDIOVASCULAR STRESS TEST  05/12/2012   with dobutamine---negative. Normal LV function   CORONARY ANGIOPLASTY  2006   ESOPHAGOGASTRODUODENOSCOPY (EGD) WITH PROPOFOL N/A 04/03/2020   Procedure: ESOPHAGOGASTRODUODENOSCOPY (EGD) WITH PROPOFOL;  Surgeon: [name], MD;  Location: [facility] ENDOSCOPY;  Service: Gastroenterology;  Laterality: N/A;   JOINT REPLACEMENT Left 2000   hip   JOINT REPLACEMENT Right 2007   hip   KNEE ARTHROPLASTY Right 06/23/2017   Procedure: COMPUTER ASSISTED TOTAL KNEE ARTHROPLASTY;  Surgeon: Donato Heinz, MD;  Location: [facility] ORS;  Service: Orthopedics;  Laterality: Right;    Prior to Admission  medications   Medication Sig Start Date End Date Taking? Authorizing Provider  albuterol (VENTOLIN HFA) 108 (90 Base) MCG/ACT inhaler Inhale 2 puffs into the lungs every 6 (six) hours as needed for wheezing or shortness of breath. 11/20/20   Alford Highland, MD  aspirin EC 81 MG tablet Take 81 mg by mouth daily.     [provider]  benzonatate (TESSALON) 100 MG capsule TAKE 1 CAPSULE BY MOUTH THREE TIMES A DAY AS NEEDED FOR COUGH 06/27/21   [name], MD  carbidopa-levodopa (SINEMET IR) 25-100 MG tablet Take 1 tablet by mouth at bedtime.  Patient not taking: Reported on 03/20/2021    [provider]  cetirizine (ZYRTEC) 10 MG tablet Take 1 tablet (10 mg total) by mouth daily. 06/16/21   Cuthriell, Delorise Royals, PA-C  clopidogrel (PLAVIX) 75 MG tablet Take 1 tablet (75 mg total) by mouth daily. Patient not taking: Reported on 03/20/2021 11/20/20   Alford Highland, MD  fluticasone [facility]) 50 MCG/ACT nasal spray Place 2 sprays into both nostrils daily. Patient not taking: Reported on 03/20/2021 11/20/20   Alford Highland, MD  furosemide (LASIX) 20 MG tablet Take 1 tablet (20 mg total) by mouth daily. Patient not taking: Reported on 03/20/2021 11/20/20   Alford Highland, MD  gabapentin (NEURONTIN) 100 MG capsule Take by mouth. Patient not taking: No sig reported 12/11/20   [provider]  irbesartan (AVAPRO) 75 MG tablet Take 1 tablet (75 mg total) by mouth daily. Patient not taking: Reported on 03/20/2021 11/20/20   Alford Highland, MD  isosorbide mononitrate (IMDUR) 30 MG 24 hr tablet Take 1 tablet (30 mg total) by mouth daily. Patient not taking: No sig reported 11/20/20   Alford Highland, MD  latanoprost (XALATAN) 0.005 % ophthalmic solution Place 1 drop into both eyes at bedtime. Patient not taking: Reported on 03/20/2021 05/30/17   [provider]  meclizine (ANTIVERT) 12.5 MG tablet Take 1 tablet (12.5 mg total) by mouth 3 (three) times daily as needed  for dizziness. 06/16/21   Cuthriell, Delorise Royals, PA-C  metoprolol succinate (TOPROL-XL) 100 MG 24 hr tablet TAKE 1 TABLET BY MOUTH EVERY DAY 04/26/21   [name], MD  nitroGLYCERIN (NITROSTAT) 0.4 MG SL tablet Place 1 tablet (0.4 mg total) under the tongue every 5 (five) minutes as needed for chest pain. Patient not taking: No sig reported 11/20/20   Alford Highland, MD  olmesartan-hydrochlorothiazide (BENICAR HCT) 20-12.5 MG tablet Take 1 tablet by mouth daily.    [provider]  pantoprazole (PROTONIX) 40 MG tablet Take 1 tablet (40 mg total) by mouth daily. 03/13/21   [name], MD  polyethylene glycol (MIRALAX / GLYCOLAX) 17 g packet Take 17 g by mouth daily as needed for severe constipation. Patient not taking: No sig reported 11/20/20   Alford Highland, MD  predniSONE (DELTASONE) 50 MG tablet Take 1 tablet (50  mg total) by mouth daily with breakfast. 06/16/21   Cuthriell, Delorise Royals, PA-C    Allergies Tramadol, Sulfa antibiotics, and Zocor [simvastatin]  Family History  Problem Relation Age of Onset   Cancer Sister 49       unknown - groin area   Heart attack Brother    Cancer Sister 51       pancreatic    Heart attack Sister    Heart attack Brother     Social History Social History   Tobacco Use   Smoking status: Never   Smokeless tobacco: Never  Vaping Use   Vaping Use: Never used  Substance Use Topics   Alcohol use: No   Drug use: No      Review of Systems Constitutional: No fever/chills Eyes: No visual changes. ENT: No sore throat. Cardiovascular: Positive chest pain Respiratory: Positive shortness of breath, cough Gastrointestinal: No abdominal pain.  No nausea, no vomiting.  No diarrhea.  No constipation. Genitourinary: Negative for dysuria. Musculoskeletal: Negative for back pain.  Leg swelling on the left Skin: Negative for rash. Neurological: Negative for headaches, focal weakness or numbness. All other ROS  negative ____________________________________________   PHYSICAL EXAM:  VITAL SIGNS: ED Triage Vitals  Enc Vitals Group     BP 10/20/21 1309 (!) 121/53     Pulse Rate 10/20/21 1309 74     Resp 10/20/21 1309 20     Temp 10/20/21 1309 98.3 F (36.8 C)     Temp src --      SpO2 10/20/21 1309 99 %     Weight 10/20/21 1310 207 lb (93.9 kg)     Height 10/20/21 1310 5\' 4"  (1.626 m)     Head Circumference --      Peak Flow --      Pain Score 10/20/21 1310 0     Pain Loc --      Pain Edu? --      Excl. in GC? --     Constitutional: Alert and oriented. Well appearing and in no acute distress. Eyes: Conjunctivae are normal. EOMI. Head: Atraumatic. Nose: No congestion/rhinnorhea. Mouth/Throat: Mucous membranes are moist.   Neck: No stridor. Trachea Midline. FROM Cardiovascular: Normal rate, regular rhythm. Grossly normal heart sounds.  Good peripheral circulation. Respiratory: Normal respiratory effort.  No retractions.  Very mild expiratory wheeze on the left Gastrointestinal: Soft and nontender. No distention. No abdominal bruits.  Musculoskeletal: No lower extremity tenderness nor edema.  No joint effusions. Neurologic:  Normal speech and language. No gross focal neurologic deficits are appreciated.  Skin:  Skin is warm, dry and intact. No rash noted. Psychiatric: Mood and affect are normal. Speech and behavior are normal. GU: Deferred   ____________________________________________   LABS (all labs ordered are listed, but only abnormal results are displayed)  Labs Reviewed  COMPREHENSIVE METABOLIC PANEL - Abnormal; Notable for the following components:      Result Value   Glucose, Bld 167 (*)    BUN 27 (*)    Creatinine, Ser 1.58 (*)    GFR, Estimated 30 (*)    All other components within normal limits  CBC - Abnormal; Notable for the following components:   RBC 3.08 (*)    Hemoglobin 9.6 (*)    HCT 29.8 (*)    RDW 15.9 (*)    All other components within normal  limits  RESP PANEL BY RT-PCR (FLU A&B, COVID) ARPGX2  BRAIN NATRIURETIC PEPTIDE  TROPONIN I (HIGH SENSITIVITY)  TROPONIN I (  HIGH SENSITIVITY)   ____________________________________________   ED ECG REPORT I, Concha Se, the attending physician, personally viewed and interpreted this ECG.  Normal sinus rate of 72, no ST elevation, no T wave inversions, normal intervals ____________________________________________  RADIOLOGY Vela Prose, personally viewed and evaluated these images (plain radiographs) as part of my medical decision making, as well as reviewing the written report by the radiologist.  ED MD interpretation: No pneumonia  Official radiology report(s): DG Chest 2 View  Result Date: 10/20/2021 CLINICAL DATA:  shortness of breath EXAM: CHEST - 2 VIEW COMPARISON:  November 17, 2020 FINDINGS: The cardiomediastinal silhouette is unchanged in contour.Atherosclerotic calcifications. No pleural effusion. No pneumothorax. Biapical scarring. No acute pleuroparenchymal abnormality. Visualized abdomen is unremarkable. Mild degenerative changes of the thoracic spine. IMPRESSION: No acute cardiopulmonary abnormality. Electronically Signed   By: Meda Klinefelter M.D.   On: 10/20/2021 13:42    ____________________________________________   PROCEDURES  Procedure(s) performed (including Critical Care):  Procedures   ____________________________________________   INITIAL IMPRESSION / ASSESSMENT AND PLAN / ED COURSE   Sheila Long was evaluated in Emergency Department on 10/20/2021 for the symptoms described in the history of present illness. She was evaluated in the context of the global COVID-19 pandemic, which necessitated consideration that the patient might be at risk for infection with the SARS-CoV-2 virus that causes COVID-19. Institutional protocols and algorithms that pertain to the evaluation of patients at risk for COVID-19 are in a state of rapid change  based on information released by regulatory bodies including the CDC and federal and state organizations. These policies and algorithms were followed during the patient's care in the ED.    Most Likely DDx:  -Given the mild wheeze I suspect that this is most likely from her sarcoidosis versus her chronic bronchitis   DDx that was also considered d/t potential to cause harm, but was found less likely based on history and physical (as detailed above): -PNA (no fevers, cough but CXR to evaluate) -PNX (reassured with equal b/l breath sounds, CXR to evaluate) -Symptomatic anemia (will get H&H) -Aortic Dissection as no tearing pain and no radiation to the mid back, pulses equal -Pericarditis no rub on exam, EKG changes or hx to suggest dx -Tamponade (no notable SOB, tachycardic, hypotensive) -Esophageal rupture (no h/o diffuse vomitting/no crepitus)  Hemoglobin slightly lower than baseline, rectal exam was done and has brown stool.  Was Hemoccult positive I recommend following closely with her primary care doctor for repeat hemoglobin in 1 week and discussion whether not she should continue her Plavix COVID test is negative Troponin is negative kidney function slightly elevated from prior Unable to do CT PE due to her kidney function and I have very low suspicion for PE given her her troponin is negative, BNP is negative and her oxygen levels 100% and with the wheeze I suspect this more likely for bronchitis.  To get a CT without chest x-ray with no evidence of pneumonia.  This is overall reassuring but did show an aneurysm and a breast mass which I have talked to patient and given a copy of the results.  Her ultrasounds were done just to make sure there is no evidence of DVT that would make me have a higher suspicion for PE and these were negative.  Given this I have very low suspicion for PE and I really suspect this is all just her chronic bronchitis.  Patient was ambulatory with normal oxygen levels  without significant work of breathing.  We discussed admission versus going home.  Patient would really like to be able to go home and is willing to try a course of prednisone, azithromycin.  She already has pulmonary follow-up on Thursday but she understands that if she develops worsening shortness of breath that she needs to return the ER immediately.  However this time she does not want to be admitted to the hospital at this is reasonable given her normal oxygen level.  She lives by herself but she has a neighbor who is right next-door and they have no concerns with her going home and being by herself.  They feel safe with this discharge plan  I discussed the provisional nature of ED diagnosis, the treatment so far, the ongoing plan of care, follow up appointments and return precautions with the patient and any family or support people present. They expressed understanding and agreed with the plan, discharged home.      ____________________________________________   FINAL CLINICAL IMPRESSION(S) / ED DIAGNOSES   Final diagnoses:  Bronchitis     MEDICATIONS GIVEN DURING THIS VISIT:  Medications  methylPREDNISolone sodium succinate (SOLU-MEDROL) 125 mg/2 mL injection 125 mg (has no administration in time range)  ipratropium-albuterol (DUONEB) 0.5-2.5 (3) MG/3ML nebulizer solution 3 mL (3 mLs Nebulization Given 10/20/21 1652)  sodium chloride 0.9 % bolus 250 mL (250 mLs Intravenous New Bag/Given 10/20/21 1651)     ED Discharge Orders          Ordered    benzonatate (TESSALON PERLES) 100 MG capsule  3 times daily PRN        10/20/21 1819    predniSONE (DELTASONE) 20 MG tablet  Daily with breakfast        10/20/21 1819    azithromycin (ZITHROMAX Z-PAK) 250 MG tablet        10/20/21 1819             Note:  This document was prepared using Dragon voice recognition software and may include unintentional dictation errors.    Concha Se, MD 10/20/21 [phone]

## 2021-10-20 NOTE — ED Provider Notes (Signed)
Emergency Medicine Provider Triage Evaluation Note  Sheila Long , a 87 y.o. female  was evaluated in triage.  Pt complains of wheezing and cough with chest pain and shortness of breath x 2 weeks. Symptoms worsen with movement. Nausea this morning. No fever.  Review of Systems  Positive: Chest pain, shortness of breath Negative: Fever  Physical Exam  There were no vitals taken for this visit. Gen:   Awake, no distress   Resp:  Normal effort  MSK:   Moves extremities without difficulty  Other:    Medical Decision Making  Medically screening exam initiated at 1:06 PM.  Appropriate orders placed.  Sheila Long was informed that the remainder of the evaluation will be completed by another provider, this initial triage assessment does not replace that evaluation, and the importance of remaining in the ED until their evaluation is complete.   Chinita Pester, FNP 10/20/21 1310    Merwyn Katos, MD 10/20/21 [phone]

## 2021-10-20 NOTE — ED Notes (Signed)
Pt taken for scans 

## 2021-10-25 DIAGNOSIS — K219 Gastro-esophageal reflux disease without esophagitis: Secondary | ICD-10-CM | POA: Diagnosis not present

## 2021-11-18 IMAGING — RF DG SWALLOWING FUNCTION
9 series · 14 of 24 positions shown · non-contrast
Comparison: None.

CLINICAL DATA: Dysphagia.

EXAM:
MODIFIED BARIUM SWALLOW
TECHNIQUE: Different consistencies of barium were administered orally to the
patient by the Speech Pathologist. Imaging of the pharynx was
performed in the lateral projection. The radiologist was present in
the fluoroscopy room for this study, providing personal supervision.
FLUOROSCOPY TIME:  Fluoroscopy Time:  54 seconds
Radiation Exposure Index (if provided by the fluoroscopic device):
2.5 mGy
Number of Acquired Spot Images: 0

[Series 1: cp_standard · 0.25mm/px · 2 of 72 frames shown (1 of 9)]
[frame 11/72]
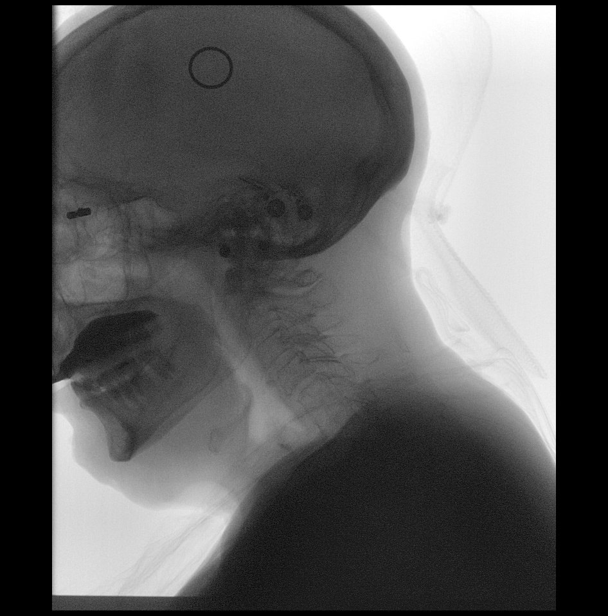
[frame 62/72]
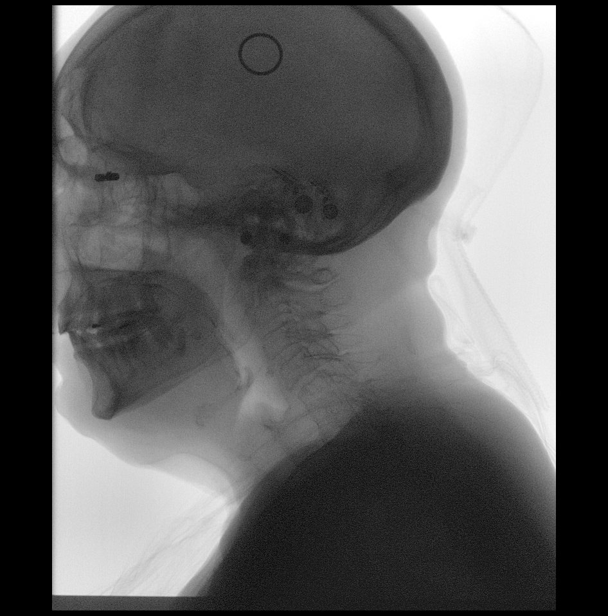

[Series 2: cp_standard · 0.25mm/px · 1 of 125 frames shown (2 of 9)]
[frame 63/125]
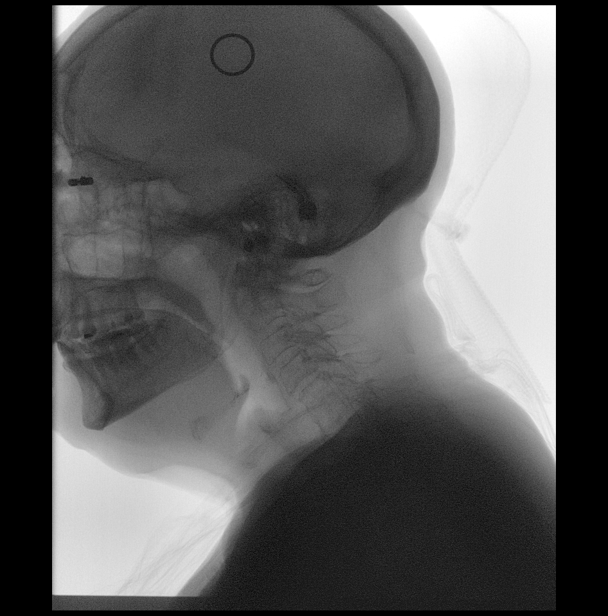

[Series 3: cp_standard · 0.25mm/px · 2 of 67 frames shown (3 of 9)]
[frame 34/67]
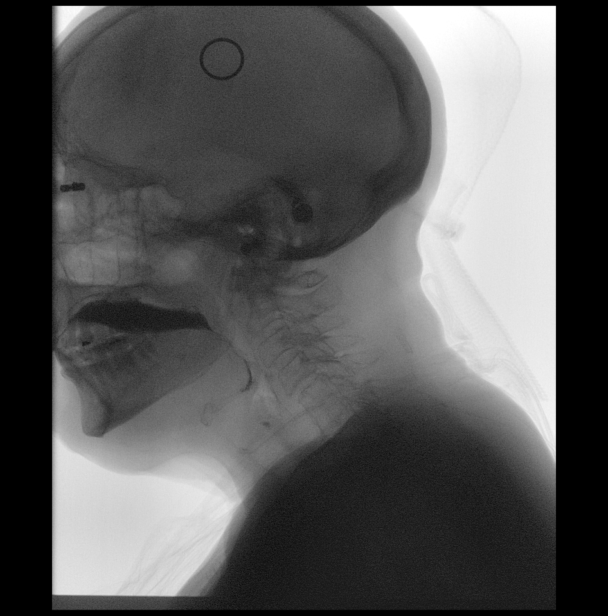
[frame 42/67]
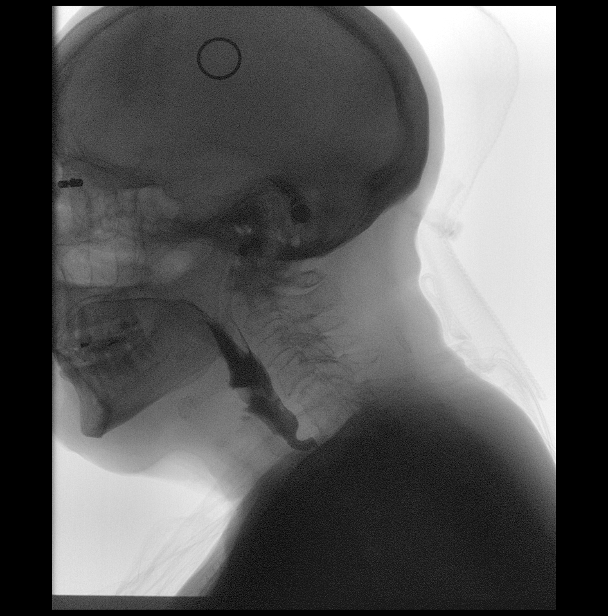

[Series 4: cp_standard · 0.25mm/px · 1 of 93 frames shown (4 of 9)]
[frame 41/93]
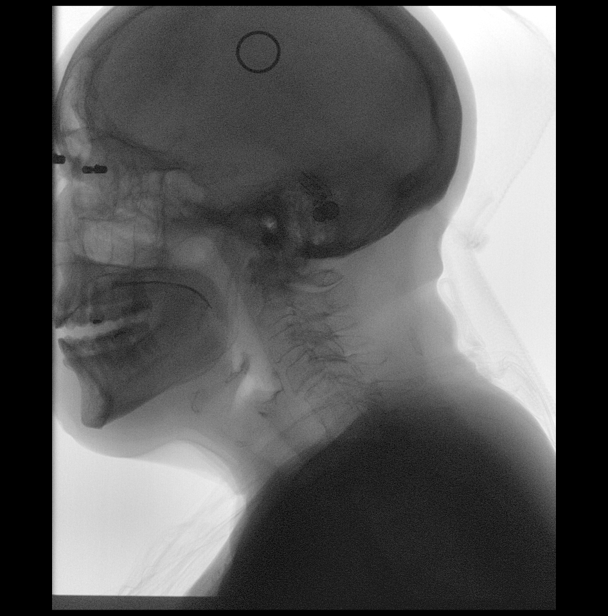

[Series 5: cp_standard · 0.25mm/px · 2 of 39 frames shown (5 of 9)]
[frame 6/39]
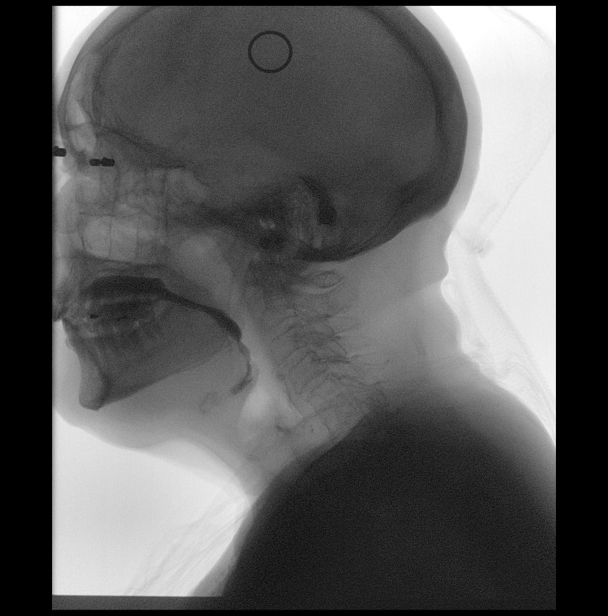
[frame 28/39]
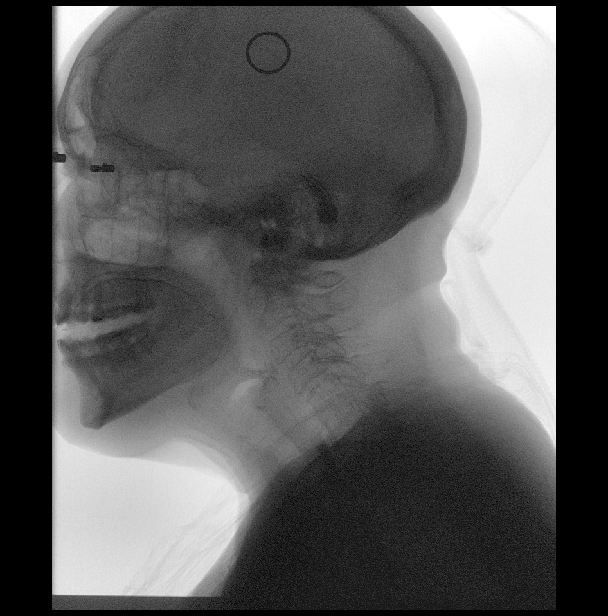

[Series 6: cp_standard · 0.25mm/px · 1 of 60 frames shown (6 of 9)]
[frame 31/60]
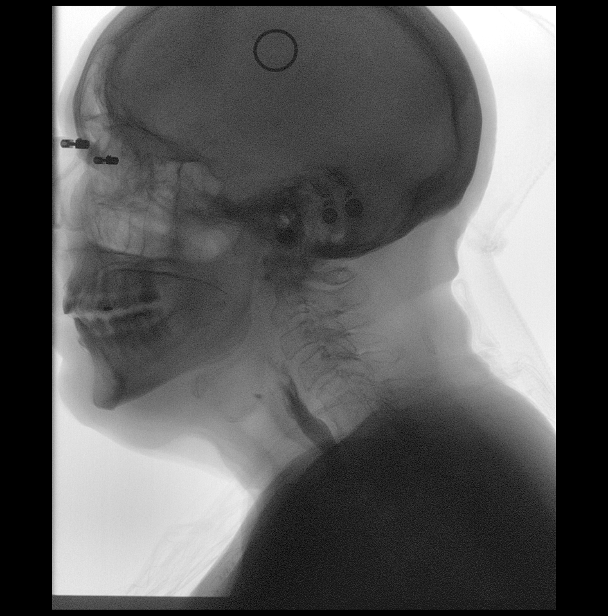

[Series 7: cp_standard · 0.25mm/px · 1 of 54 frames shown (7 of 9)]
[frame 9/54]
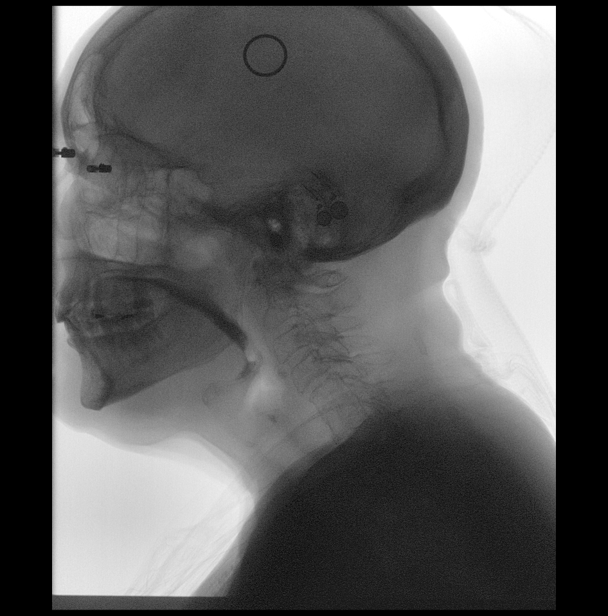

[Series 8: cp_standard · 0.25mm/px · 2 of 73 frames shown (8 of 9)]
[frame 11/73]
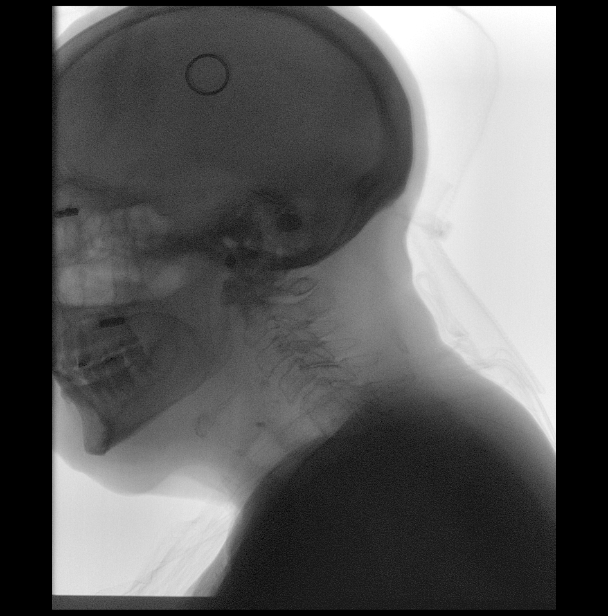
[frame 13/73]
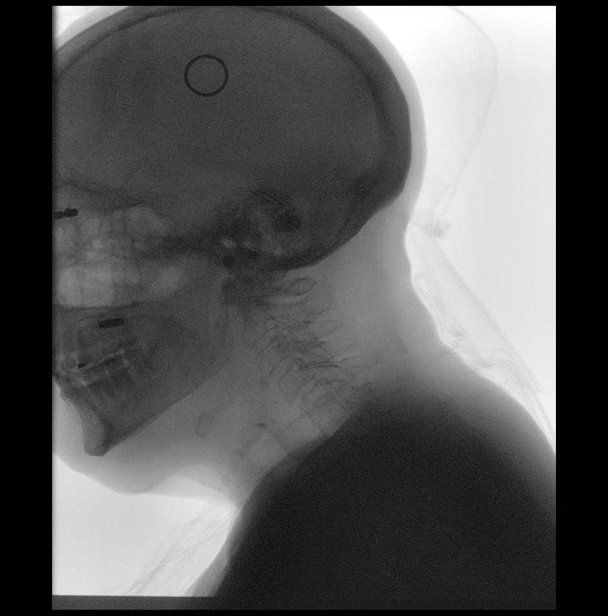

[Series 9: cp_standard · 0.25mm/px · 2 of 58 frames shown (9 of 9)]
[frame 9/58]
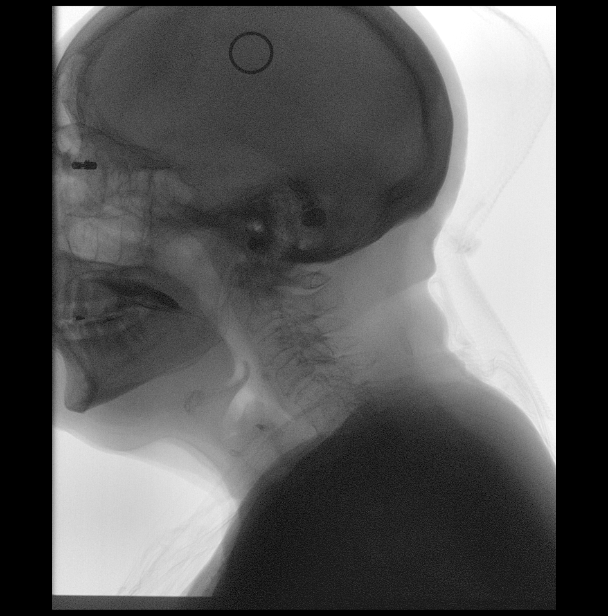
[frame 50/58]
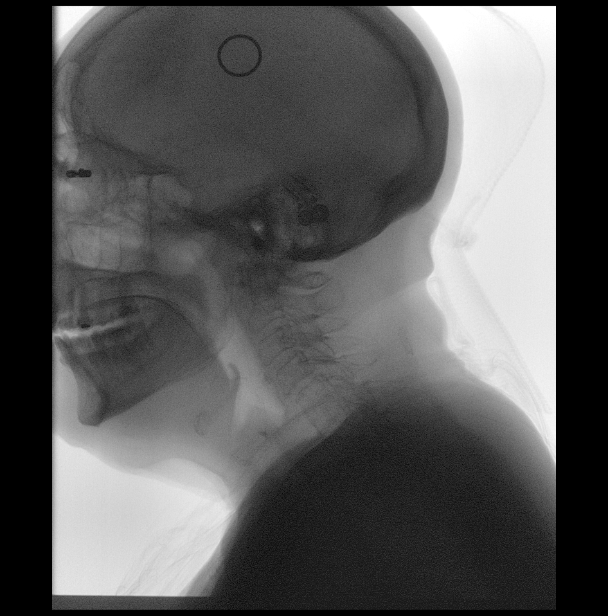

[14 of 24 positions shown; findings below may reference images not displayed]

FINDINGS: Real-time fluoroscopy of the swallowing function was performed with
a speech pathologist present.

Multiple consistencies of barium were administered which included
water, nectar, applesauce, barium tablet and Luan cracker.

Normal swallow function. No laryngeal penetration or tracheal
aspiration.
IMPRESSION: Modified barium swallow as described above.

Please refer to the Speech Pathologists report for complete details
and recommendations.

## 2021-11-19 ENCOUNTER — Other Ambulatory Visit: Payer: Self-pay | Admitting: *Deleted

## 2021-11-19 MED ORDER — OLMESARTAN MEDOXOMIL-HCTZ 20-12.5 MG PO TABS: 1.0000 | ORAL_TABLET | Freq: Every day | ORAL | 2 refills | Status: DC

## 2021-11-20 ENCOUNTER — Encounter (HOSPITAL_COMMUNITY): Payer: Self-pay | Admitting: Radiology

## 2021-11-20 DIAGNOSIS — I509 Heart failure, unspecified: Secondary | ICD-10-CM | POA: Diagnosis not present

## 2021-11-20 DIAGNOSIS — I110 Hypertensive heart disease with heart failure: Secondary | ICD-10-CM | POA: Diagnosis not present

## 2021-11-20 DIAGNOSIS — K449 Diaphragmatic hernia without obstruction or gangrene: Secondary | ICD-10-CM | POA: Diagnosis not present

## 2021-11-23 DIAGNOSIS — R600 Localized edema: Secondary | ICD-10-CM | POA: Diagnosis not present

## 2021-12-03 DIAGNOSIS — M25472 Effusion, left ankle: Secondary | ICD-10-CM | POA: Diagnosis not present

## 2021-12-05 DIAGNOSIS — N1832 Chronic kidney disease, stage 3b: Secondary | ICD-10-CM | POA: Diagnosis not present

## 2021-12-05 DIAGNOSIS — J42 Unspecified chronic bronchitis: Secondary | ICD-10-CM | POA: Diagnosis not present

## 2021-12-05 DIAGNOSIS — R0602 Shortness of breath: Secondary | ICD-10-CM | POA: Diagnosis not present

## 2021-12-05 DIAGNOSIS — I739 Peripheral vascular disease, unspecified: Secondary | ICD-10-CM | POA: Diagnosis not present

## 2021-12-05 DIAGNOSIS — I10 Essential (primary) hypertension: Secondary | ICD-10-CM | POA: Diagnosis not present

## 2021-12-05 DIAGNOSIS — N63 Unspecified lump in unspecified breast: Secondary | ICD-10-CM | POA: Diagnosis not present

## 2021-12-05 DIAGNOSIS — I25110 Atherosclerotic heart disease of native coronary artery with unstable angina pectoris: Secondary | ICD-10-CM | POA: Diagnosis not present

## 2021-12-06 ENCOUNTER — Other Ambulatory Visit: Payer: Self-pay | Admitting: Internal Medicine

## 2021-12-06 DIAGNOSIS — N63 Unspecified lump in unspecified breast: Secondary | ICD-10-CM

## 2021-12-06 DIAGNOSIS — I25110 Atherosclerotic heart disease of native coronary artery with unstable angina pectoris: Secondary | ICD-10-CM | POA: Diagnosis not present

## 2021-12-06 DIAGNOSIS — R0602 Shortness of breath: Secondary | ICD-10-CM | POA: Diagnosis not present

## 2021-12-11 DIAGNOSIS — H40153 Residual stage of open-angle glaucoma, bilateral: Secondary | ICD-10-CM | POA: Diagnosis not present

## 2022-01-04 DIAGNOSIS — Z7982 Long term (current) use of aspirin: Secondary | ICD-10-CM | POA: Diagnosis not present

## 2022-01-04 DIAGNOSIS — G20 Parkinson's disease: Secondary | ICD-10-CM | POA: Diagnosis not present

## 2022-01-04 DIAGNOSIS — I509 Heart failure, unspecified: Secondary | ICD-10-CM | POA: Diagnosis not present

## 2022-01-04 DIAGNOSIS — I70223 Atherosclerosis of native arteries of extremities with rest pain, bilateral legs: Secondary | ICD-10-CM | POA: Diagnosis not present

## 2022-01-04 DIAGNOSIS — N1832 Chronic kidney disease, stage 3b: Secondary | ICD-10-CM | POA: Diagnosis not present

## 2022-01-04 DIAGNOSIS — Z7902 Long term (current) use of antithrombotics/antiplatelets: Secondary | ICD-10-CM | POA: Diagnosis not present

## 2022-01-24 DIAGNOSIS — D860 Sarcoidosis of lung: Secondary | ICD-10-CM | POA: Diagnosis not present

## 2022-01-24 DIAGNOSIS — Z01818 Encounter for other preprocedural examination: Secondary | ICD-10-CM | POA: Diagnosis not present

## 2022-02-20 DIAGNOSIS — D869 Sarcoidosis, unspecified: Secondary | ICD-10-CM | POA: Diagnosis not present

## 2022-03-19 DIAGNOSIS — H40153 Residual stage of open-angle glaucoma, bilateral: Secondary | ICD-10-CM | POA: Diagnosis not present

## 2022-03-26 DIAGNOSIS — D860 Sarcoidosis of lung: Secondary | ICD-10-CM | POA: Diagnosis not present

## 2022-04-04 DIAGNOSIS — H40153 Residual stage of open-angle glaucoma, bilateral: Secondary | ICD-10-CM | POA: Diagnosis not present

## 2022-04-19 ENCOUNTER — Other Ambulatory Visit: Payer: Self-pay | Admitting: *Deleted

## 2022-04-19 DIAGNOSIS — K2100 Gastro-esophageal reflux disease with esophagitis, without bleeding: Secondary | ICD-10-CM | POA: Diagnosis not present

## 2022-04-19 DIAGNOSIS — G20 Parkinson's disease: Secondary | ICD-10-CM | POA: Diagnosis not present

## 2022-04-19 DIAGNOSIS — I119 Hypertensive heart disease without heart failure: Secondary | ICD-10-CM | POA: Diagnosis not present

## 2022-04-19 MED ORDER — CARBIDOPA-LEVODOPA 25-100 MG PO TABS: 1.0000 | ORAL_TABLET | Freq: Every day | ORAL | 3 refills | Status: DC

## 2022-04-21 ENCOUNTER — Other Ambulatory Visit: Payer: Self-pay

## 2022-04-21 ENCOUNTER — Observation Stay
Admission: EM | Admit: 2022-04-21 | Discharge: 2022-04-23 | Disposition: A | Discharge status: Home Health | Payer: PPO | Attending: Internal Medicine | Admitting: Internal Medicine

## 2022-04-21 ENCOUNTER — Emergency Department: Payer: PPO

## 2022-04-21 DIAGNOSIS — Z7982 Long term (current) use of aspirin: Secondary | ICD-10-CM | POA: Insufficient documentation

## 2022-04-21 DIAGNOSIS — I110 Hypertensive heart disease with heart failure: Secondary | ICD-10-CM | POA: Diagnosis not present

## 2022-04-21 DIAGNOSIS — K219 Gastro-esophageal reflux disease without esophagitis: Secondary | ICD-10-CM | POA: Diagnosis present

## 2022-04-21 DIAGNOSIS — I2510 Atherosclerotic heart disease of native coronary artery without angina pectoris: Secondary | ICD-10-CM | POA: Diagnosis not present

## 2022-04-21 DIAGNOSIS — R531 Weakness: Secondary | ICD-10-CM | POA: Diagnosis not present

## 2022-04-21 DIAGNOSIS — Z79899 Other long term (current) drug therapy: Secondary | ICD-10-CM | POA: Diagnosis not present

## 2022-04-21 DIAGNOSIS — I10 Essential (primary) hypertension: Secondary | ICD-10-CM | POA: Diagnosis not present

## 2022-04-21 DIAGNOSIS — R0602 Shortness of breath: Secondary | ICD-10-CM | POA: Diagnosis not present

## 2022-04-21 DIAGNOSIS — F419 Anxiety disorder, unspecified: Secondary | ICD-10-CM | POA: Diagnosis present

## 2022-04-21 DIAGNOSIS — Z96651 Presence of right artificial knee joint: Secondary | ICD-10-CM | POA: Insufficient documentation

## 2022-04-21 DIAGNOSIS — Z20822 Contact with and (suspected) exposure to covid-19: Secondary | ICD-10-CM | POA: Insufficient documentation

## 2022-04-21 DIAGNOSIS — M7989 Other specified soft tissue disorders: Secondary | ICD-10-CM | POA: Diagnosis present

## 2022-04-21 DIAGNOSIS — K5900 Constipation, unspecified: Secondary | ICD-10-CM | POA: Diagnosis present

## 2022-04-21 DIAGNOSIS — G20A1 Parkinson's disease without dyskinesia, without mention of fluctuations: Secondary | ICD-10-CM | POA: Diagnosis present

## 2022-04-21 DIAGNOSIS — K922 Gastrointestinal hemorrhage, unspecified: Secondary | ICD-10-CM | POA: Insufficient documentation

## 2022-04-21 DIAGNOSIS — D869 Sarcoidosis, unspecified: Secondary | ICD-10-CM | POA: Diagnosis present

## 2022-04-21 DIAGNOSIS — D649 Anemia, unspecified: Secondary | ICD-10-CM | POA: Diagnosis not present

## 2022-04-21 DIAGNOSIS — Z96643 Presence of artificial hip joint, bilateral: Secondary | ICD-10-CM | POA: Insufficient documentation

## 2022-04-21 DIAGNOSIS — E785 Hyperlipidemia, unspecified: Secondary | ICD-10-CM | POA: Diagnosis present

## 2022-04-21 DIAGNOSIS — I509 Heart failure, unspecified: Secondary | ICD-10-CM | POA: Insufficient documentation

## 2022-04-21 DIAGNOSIS — G20 Parkinson's disease: Secondary | ICD-10-CM | POA: Diagnosis not present

## 2022-04-21 LAB — COMPREHENSIVE METABOLIC PANEL
ALT: 9 U/L (ref 0–44)
AST: 14 U/L — ABNORMAL LOW (ref 15–41)
Albumin: 3.5 g/dL (ref 3.5–5.0)
Alkaline Phosphatase: 64 U/L (ref 38–126)
Anion gap: 5 (ref 5–15)
BUN: 17 mg/dL (ref 8–23)
CO2: 23 mmol/L (ref 22–32)
Calcium: 8.6 mg/dL — ABNORMAL LOW (ref 8.9–10.3)
Chloride: 112 mmol/L — ABNORMAL HIGH (ref 98–111)
Creatinine, Ser: 1.18 mg/dL — ABNORMAL HIGH (ref 0.44–1.00)
GFR, Estimated: 42 mL/min — ABNORMAL LOW (ref >60)
Glucose, Bld: 164 mg/dL — ABNORMAL HIGH (ref 70–99)
Potassium: 3.9 mmol/L (ref 3.5–5.1)
Sodium: 140 mmol/L (ref 135–145)
Total Bilirubin: 0.9 mg/dL (ref 0.3–1.2)
Total Protein: 6.3 g/dL — ABNORMAL LOW (ref 6.5–8.1)

## 2022-04-21 LAB — CBC
HCT: 25.2 % — ABNORMAL LOW (ref 36.0–46.0)
Hemoglobin: 7.4 g/dL — ABNORMAL LOW (ref 12.0–15.0)
MCH: 26.3 pg (ref 26.0–34.0)
MCHC: 29.4 g/dL — ABNORMAL LOW (ref 30.0–36.0)
MCV: 89.7 fL (ref 80.0–100.0)
Platelets: 296 10*3/uL (ref 150–400)
RBC: 2.81 MIL/uL — ABNORMAL LOW (ref 3.87–5.11)
RDW: 17.4 % — ABNORMAL HIGH (ref 11.5–15.5)
WBC: 6.3 10*3/uL (ref 4.0–10.5)
nRBC: 0 % (ref 0.0–0.2)

## 2022-04-21 LAB — PREPARE RBC (CROSSMATCH)

## 2022-04-21 LAB — TROPONIN I (HIGH SENSITIVITY): Troponin I (High Sensitivity): 13 ng/L (ref <18)

## 2022-04-21 LAB — RESP PANEL BY RT-PCR (FLU A&B, COVID) ARPGX2
Influenza A by PCR: NEGATIVE
Influenza B by PCR: NEGATIVE
SARS Coronavirus 2 by RT PCR: NEGATIVE

## 2022-04-21 LAB — BRAIN NATRIURETIC PEPTIDE: B Natriuretic Peptide: 385.5 pg/mL — ABNORMAL HIGH (ref 0.0–100.0)

## 2022-04-21 MED ORDER — POLYETHYLENE GLYCOL 3350 17 G PO PACK: 17.0000 g | PACK | Freq: Every day | ORAL | Status: DC | PRN

## 2022-04-21 MED ORDER — METOPROLOL SUCCINATE ER 100 MG PO TB24
100.0000 mg | ORAL_TABLET | Freq: Every day | ORAL | Status: DC
  Administered 2022-04-22 – 2022-04-23 (×2): 100 mg via ORAL
  Filled 2022-04-21 (×2): qty 1

## 2022-04-21 MED ORDER — PANTOPRAZOLE SODIUM 40 MG IV SOLR: 40.0000 mg | Freq: Two times a day (BID) | INTRAVENOUS | Status: DC

## 2022-04-21 MED ORDER — ALBUTEROL SULFATE HFA 108 (90 BASE) MCG/ACT IN AERS: 2.0000 | INHALATION_SPRAY | Freq: Four times a day (QID) | RESPIRATORY_TRACT | Status: DC | PRN

## 2022-04-21 MED ORDER — ALBUTEROL SULFATE (2.5 MG/3ML) 0.083% IN NEBU: 2.5000 mg | INHALATION_SOLUTION | Freq: Four times a day (QID) | RESPIRATORY_TRACT | Status: DC | PRN

## 2022-04-21 MED ORDER — ONDANSETRON HCL 4 MG PO TABS: 4.0000 mg | ORAL_TABLET | Freq: Four times a day (QID) | ORAL | Status: DC | PRN

## 2022-04-21 MED ORDER — PANTOPRAZOLE 80MG IVPB - SIMPLE MED
80.0000 mg | Freq: Once | INTRAVENOUS | Status: AC
  Administered 2022-04-22: 80 mg via INTRAVENOUS
  Filled 2022-04-21 (×2): qty 100

## 2022-04-21 MED ORDER — MECLIZINE HCL 25 MG PO TABS
12.5000 mg | ORAL_TABLET | Freq: Three times a day (TID) | ORAL | Status: DC | PRN
  Filled 2022-04-21: qty 0.5

## 2022-04-21 MED ORDER — PANTOPRAZOLE INFUSION (NEW) - SIMPLE MED
8.0000 mg/h | INTRAVENOUS | Status: DC
Start: 2022-04-21 — End: 2022-04-22
  Administered 2022-04-22: 8 mg/h via INTRAVENOUS
  Filled 2022-04-21 (×2): qty 100

## 2022-04-21 MED ORDER — LATANOPROST 0.005 % OP SOLN
1.0000 [drp] | Freq: Every day | OPHTHALMIC | Status: DC
  Administered 2022-04-22: 1 [drp] via OPHTHALMIC
  Filled 2022-04-21: qty 2.5

## 2022-04-21 MED ORDER — OLMESARTAN MEDOXOMIL-HCTZ 20-12.5 MG PO TABS: 1.0000 | ORAL_TABLET | Freq: Every day | ORAL | Status: DC

## 2022-04-21 MED ORDER — FUROSEMIDE 20 MG PO TABS
20.0000 mg | ORAL_TABLET | Freq: Every day | ORAL | Status: DC
  Administered 2022-04-22 – 2022-04-23 (×2): 20 mg via ORAL
  Filled 2022-04-21 (×2): qty 1

## 2022-04-21 MED ORDER — FLUTICASONE PROPIONATE 50 MCG/ACT NA SUSP: 2.0000 | Freq: Every day | NASAL | Status: DC | PRN

## 2022-04-21 MED ORDER — FUROSEMIDE 10 MG/ML IJ SOLN
20.0000 mg | Freq: Every day | INTRAMUSCULAR | Status: AC
  Administered 2022-04-22: 20 mg via INTRAVENOUS
  Filled 2022-04-21: qty 4

## 2022-04-21 MED ORDER — IRBESARTAN 150 MG PO TABS
150.0000 mg | ORAL_TABLET | Freq: Every day | ORAL | Status: DC
  Administered 2022-04-22 – 2022-04-23 (×2): 150 mg via ORAL
  Filled 2022-04-21 (×2): qty 1

## 2022-04-21 MED ORDER — ACETAMINOPHEN 650 MG RE SUPP: 650.0000 mg | Freq: Four times a day (QID) | RECTAL | Status: DC | PRN

## 2022-04-21 MED ORDER — HYDROCHLOROTHIAZIDE 12.5 MG PO TABS
12.5000 mg | ORAL_TABLET | Freq: Every day | ORAL | Status: DC
  Administered 2022-04-22 – 2022-04-23 (×2): 12.5 mg via ORAL
  Filled 2022-04-21 (×2): qty 1

## 2022-04-21 MED ORDER — NITROGLYCERIN 0.4 MG SL SUBL: 0.4000 mg | SUBLINGUAL_TABLET | SUBLINGUAL | Status: DC | PRN

## 2022-04-21 MED ORDER — SODIUM CHLORIDE 0.9 % IV SOLN: 10.0000 mL/h | Freq: Once | INTRAVENOUS | Status: DC

## 2022-04-21 MED ORDER — ACETAMINOPHEN 325 MG PO TABS: 650.0000 mg | ORAL_TABLET | Freq: Four times a day (QID) | ORAL | Status: DC | PRN

## 2022-04-21 MED ORDER — MELATONIN 5 MG PO TABS: 5.0000 mg | ORAL_TABLET | Freq: Every evening | ORAL | Status: DC | PRN

## 2022-04-21 MED ORDER — ONDANSETRON HCL 4 MG/2ML IJ SOLN: 4.0000 mg | Freq: Four times a day (QID) | INTRAMUSCULAR | Status: DC | PRN

## 2022-04-21 MED ORDER — CARBIDOPA-LEVODOPA 25-100 MG PO TABS
1.0000 | ORAL_TABLET | Freq: Every day | ORAL | Status: DC
  Administered 2022-04-22 (×2): 1 via ORAL
  Filled 2022-04-21 (×3): qty 1

## 2022-04-21 NOTE — ED Provider Notes (Signed)
? ?[facility] ?Provider Note ? ? ? Event Date/Time  ? First MD Initiated Contact with Patient 04/21/22 1826   ?  (approximate) ? ? ?History  ? ?Weakness ? ? ?HPI ? ?Sheila Long is a 88 y.o. female with a history of CHF, CAD, PVD who presents with complaints of generalized weakness, shortness of breath with exertion.  She denies chest pain.  No abdominal pain no nausea or vomiting.  No fevers no cough.  Typically she is able to ambulate fairly well but recently she has been getting short of breath with ambulation and feels dizzy when she goes to stand up.  Denies diarrhea.  No new medications. ?  ? ? ?Physical Exam  ? ?Triage Vital Signs: ?ED Triage Vitals  ?Enc Vitals Group  ?   BP 04/21/22 1829 (!) 152/73  ?   Pulse Rate 04/21/22 1829 78  ?   Resp 04/21/22 1829 20  ?   Temp 04/21/22 1833 99.1 ?F (37.3 ?C)  ?   Temp Source 04/21/22 1833 Oral  ?   SpO2 04/21/22 1829 100 %  ?   Weight 04/21/22 1829 93.9 kg (207 lb)  ?   Height 04/21/22 1829 1.626 m (5\' 4" )  ?   Head Circumference --   ?   Peak Flow --   ?   Pain Score 04/21/22 1829 0  ?   Pain Loc --   ?   Pain Edu? --   ?   Excl. in GC? --   ? ? ?Most recent vital signs: ?Vitals:  ? 04/21/22 1900 04/21/22 1930  ?BP: (!) 130/54 (!) 120/46  ?Pulse: 75 74  ?Resp: (!) 21 (!) 31  ?Temp:    ?SpO2: 100% 100%  ? ? ? ?General: Awake, no distress.  ?CV:  Good peripheral perfusion.  Regular rate and rhythm ?Resp:  Normal effort.  Clear to auscultation bilaterally ?Abd:  No distention.  Soft, nontender ?Other:  No calf pain or swelling ? ? ?ED Results / Procedures / Treatments  ? ?Labs ?(all labs ordered are listed, but only abnormal results are displayed) ?Labs Reviewed  ?CBC - Abnormal; Notable for the following components:  ?    Result Value  ? RBC 2.81 (*)   ? Hemoglobin 7.4 (*)   ? HCT 25.2 (*)   ? MCHC 29.4 (*)   ? RDW 17.4 (*)   ? All other components within normal limits  ?COMPREHENSIVE METABOLIC PANEL - Abnormal; Notable for the following  components:  ? Chloride 112 (*)   ? Glucose, Bld 164 (*)   ? Creatinine, Ser 1.18 (*)   ? Calcium 8.6 (*)   ? Total Protein 6.3 (*)   ? AST 14 (*)   ? GFR, Estimated 42 (*)   ? All other components within normal limits  ?BRAIN NATRIURETIC PEPTIDE - Abnormal; Notable for the following components:  ? B Natriuretic Peptide 385.5 (*)   ? All other components within normal limits  ?RESP PANEL BY RT-PCR (FLU A&B, COVID) ARPGX2  ?URINALYSIS, ROUTINE W REFLEX MICROSCOPIC  ?APTT  ?PROTIME-INR  ?TYPE AND SCREEN  ?PREPARE RBC (CROSSMATCH)  ?TROPONIN I (HIGH SENSITIVITY)  ? ? ? ?EKG ? ?ED ECG REPORT ?I, 04/23/22, the attending physician, personally viewed and interpreted this ECG. ? ?Date: 04/21/2022 ? ?Rhythm: normal sinus rhythm ?QRS Axis: normal ?Intervals: normal ?ST/T Wave abnormalities: normal ?Narrative Interpretation: no evidence of acute ischemia ? ? ? ?RADIOLOGY ?Chest x-ray reviewed by me, no  acute abnormality ? ? ? ?PROCEDURES: ? ?Critical Care performed: yes ? ?CRITICAL CARE ?Performed by: Jene Every ? ? ?Total critical care time: 30 minutes ? ?Critical care time was exclusive of separately billable procedures and treating other patients. ? ?Critical care was necessary to treat or prevent imminent or life-threatening deterioration. ? ?Critical care was time spent personally by me on the following activities: development of treatment plan with patient and/or surrogate as well as nursing, discussions with consultants, evaluation of patient's response to treatment, examination of patient, obtaining history from patient or surrogate, ordering and performing treatments and interventions, ordering and review of laboratory studies, ordering and review of radiographic studies, pulse oximetry and re-evaluation of patient's condition. ? ? ?Procedures ? ? ?MEDICATIONS ORDERED IN ED: ?Medications  ?0.9 %  sodium chloride infusion (has no administration in time range)  ? ? ? ?IMPRESSION / MDM / ASSESSMENT AND PLAN / ED  COURSE  ?I reviewed the triage vital signs and the nursing notes. ? ?Patient presents with complaints of weakness, shortness of breath with exertion over the last week.  She denies fevers or chills.  No cough.  No chest pain.  States that it is hard to describe how she feels. ? ?We will obtain labs, chest x-ray, urinalysis and reevaluate ? ?Lab work is notable for hemoglobin of 7.6, this seems about 2 g lower than her baseline.  Rectal exam performed which is dark brown stool, guaiac positive consistent with GI bleed, this is likely the cause of her shortness of breath, dizziness.  Discussed risks of blood transfusion, she has given permission for PRBCs ? ?I have consulted the hospitalist for admission ? ? ? ? ? ?  ? ? ?FINAL CLINICAL IMPRESSION(S) / ED DIAGNOSES  ? ?Final diagnoses:  ?Gastrointestinal hemorrhage, unspecified gastrointestinal hemorrhage type  ? ? ? ?Rx / DC Orders  ? ?ED Discharge Orders   ? ? None  ? ?  ? ? ? ?Note:  This document was prepared using Dragon voice recognition software and may include unintentional dictation errors. ?  ?Jene Every, MD ?04/21/22 2027 ? ?

## 2022-04-21 NOTE — H&P (Addendum)
?History and Physical  ? ?Sheila Long WUJ:[id number] DOB: January 07, 1924 DOA: 04/21/2022 ? ?PCP: Corky DownsMasoud, Javed, MD  ?Outpatient Specialists: Dr. Bennie HindAleskerov, [facility] pulmonology ?Patient coming from: Home via EMS ? ?I have personally briefly reviewed patient's old medical records in [facility] EMR. ? ?Chief Concern: Weakness ? ?HPI: [name] is a 88 year old female with hypertension, pulmonary sarcoidosis, chronic bronchitis, hyperlipidemia, GERD, moderate aortic regurgitation, peripheral neuropathy, pulmonary hypertension, sleep apnea, who presents emergency department for chief concerns of weakness and malaise. ? ?Initial vitals in the emergency department showed temperature of 99.1, respiration rate 21, heart rate 75, blood pressure 130/54, SPO2 of 100% on room air. ? ?Serum sodium is 140, potassium 3.9, chloride 112, bicarb 23, nonfasting blood glucose 164, BUN 17, serum creatinine of 1.18, GFR 42, WBC 6.3, hemoglobin 7.4, platelets of 296. ? ?BNP was 385.5.  High sensitive troponin was 13. ? ?COVID/influenza A/influenza B PCR were negative. ? ?Portable chest x-ray: Left lower lobe opacity could reflect atelectasis or developing pneumonia.  Small left pleural effusion. ? ?ED treatment: EDP ordered 1 unit of PRBC for transfusion ? ?At bedside, she is able to tell me her name, age, location of hospital, current calendar year.  ? ?She reports that since Monday she has been feeling general unwellness.  She cannot describe her feelings for me.  She states she had difficulty walking because her legs were hurting.  She also endorses worsening swelling of her lower extremities. ? ?She denies chest pain, shortness of breath, abdominal pain, dysuria, nausea, vomiting, fever, vision changes. ? ?[name], feeling sick. ? ?Social history: She lives by herself and performs her adls. She cooks for herself. She states her neighbors grocery shops for her and helps her with  laundry.  She denies history of tobacco, EtOH, recreational drug use.  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  ? ?ROS: ?Constitutional: no weight change, no fever ?ENT/Mouth: no sore throat, no rhinorrhea ?Eyes: no eye pain, no vision changes ?Cardiovascular: no chest pain, no dyspnea,  no edema, no palpitations ?Respiratory: no cough, no sputum, no wheezing ?Gastrointestinal: no nausea, no vomiting, no diarrhea, no constipation ?Genitourinary: no urinary incontinence, no dysuria, no hematuria ?Musculoskeletal: no arthralgias, + myalgias ?Skin: no skin lesions, no pruritus, ?Neuro: + weakness, no loss of consciousness, no syncope ?Psych: no anxiety, no depression, + decrease appetite ?Heme/Lymph: no bruising, no bleeding ? ?ED Course: Discussed with emergency medicine provider, patient requiring hospitalization for chief concerns of acute anemia. ? ?Assessment/Plan ? ?Principal Problem: ?  Acute on chronic anemia ?Active Problems: ?  Sarcoidosis ?  Hypertension ?  Coronary artery disease ?  GERD (gastroesophageal reflux disease) ?  Pulmonary hypertension associated with sarcoidosis (HCC) ?  Anxiety ?  Constipation ?  Dyslipidemia ?  Weakness ?  Parkinson disease (HCC) ?  Leg swelling ?  ?Assessment and Plan: ? ?* Acute on chronic anemia ?- Chronic normocytic anemia, baseline hemoglobin in 2022 has been 9.6-11 ?- Query upper GI bleed ?- Status post 1 unit of PRBC per EDP ?- Protonix GGT ordered ?- Repeat CBC at 2300 on day of admission, goal repeat hgb > 8 ?- Messaged RN to ensure that patient has 2 PIV ?- Gastroenterology has been consulted, Dr. Allegra LaiVanga ?- Admit to progressive, observation ? ?Leg swelling ?- Patient endorses worsening bilateral lower extremity pain and swelling since Monday, 04/15/2022 ?- Differentials include DVT versus worsening PAD ?- Bilateral lower  extremity ultrasound ordered  ? ?Parkinson disease (HCC) ?- Resumed home carbidopa-levodopa,  25-100 mg nightly ? ?Weakness ?- Query secondary to acute anemia ?- Check procalcitonin, if elevated would recommend a.m. team initiate commune acquired pneumonia treatment ? ?Constipation ?- Resumed home GlycoLax daily as needed for mild and moderate constipation ? ?Coronary artery disease ?- Aspirin 81 mg daily not resumed due to acute on chronic anemia ?- Resumed home nitroglycerin as needed for chest pain, metoprolol succinate 100 mg daily ? ?Hypertension ?- Resumed home olmesartan-hydrochlorothiazide 20-12.5 mg daily, metoprolol succinate 100 mg daily ? ?DVT prophylaxis-pharmacologic DVT prophylaxis has not been initiated by myself due to acute on chronic anemia ?- AM team to initiate pharmacologic DVT prophylaxis when appropriate ? ?Chart reviewed.  ? ?DVT prophylaxis: TED hose ?Code Status: Full code ?Diet: Clear liquids, n.p.o. after midnight ?Family Communication: This was offered, she states that she already called her sister. ?Disposition Plan: Pending clinical course ?Consults called: Gastroenterology ?Admission status: Progressive, observation ? ?Past Medical History:  ?Diagnosis Date  ? Allergic rhinitis due to pollen   ? Anxiety   ? Aortic atherosclerosis (HCC)   ? CHF (congestive heart failure) (HCC)   ? Coronary artery disease   ? Stent in 2000's ([place])  ? Dyspnea   ? GERD (gastroesophageal reflux disease)   ? Hard of hearing   ? Hypertension   ? Neuropathy   ? Obstructive sleep apnea   ? Osteoarthritis of both knees   ? Peripheral vascular disease (HCC)   ? Sarcoidosis   ? Thyroid nodule   ? ?Past Surgical History:  ?Procedure Laterality Date  ? ABDOMINAL HYSTERECTOMY    ? BIOPSY THYROID  2014  ? Dr Andee Poles  ? BREAST EXCISIONAL BIOPSY Left 1980s  ? surgical bx neg  ? BREAST EXCISIONAL BIOPSY Left 1980s  ? benign  ? CARDIOVASCULAR STRESS TEST  05/12/2012  ? with dobutamine---negative. Normal LV function  ? CORONARY ANGIOPLASTY  2006  ? ESOPHAGOGASTRODUODENOSCOPY (EGD) WITH PROPOFOL N/A 04/03/2020  ?  Procedure: ESOPHAGOGASTRODUODENOSCOPY (EGD) WITH PROPOFOL;  Surgeon: [name], MD;  Location: [facility] ENDOSCOPY;  Service: Gastroenterology;  Laterality: N/A;  ? JOINT REPLACEMENT Left 2000  ? hip  ? JOINT REPLACEMENT Right 2007  ? hip  ? KNEE ARTHROPLASTY Right 06/23/2017  ? Procedure: COMPUTER ASSISTED TOTAL KNEE ARTHROPLASTY;  Surgeon: Donato Heinz, MD;  Location: [facility] ORS;  Service: Orthopedics;  Laterality: Right;  ? ?Social History:  reports that she has never smoked. She has never used smokeless tobacco. She reports that she does not drink alcohol and does not use drugs. ? ?Allergies  ?Allergen Reactions  ? Tramadol Other (See Comments)  ?  CONFUSION  ? Sulfa Antibiotics   ?  Patient does not recall  ? Zocor [Simvastatin] Other (See Comments)  ?  INTOLERANCE-MYALGIAS ?  ? ?Family History  ?Problem Relation Age of Onset  ? Cancer Sister 61  ?     unknown - groin area  ? Heart attack Brother   ? Cancer Sister 71  ?     pancreatic   ? Heart attack Sister   ? Heart attack Brother   ? ?Family history: Family history reviewed and not pertinent ? ?Prior to Admission medications   ?Medication Sig Start Date End Date Taking? Authorizing Provider  ?albuterol (VENTOLIN HFA) 108 (90 Base) MCG/ACT inhaler Inhale 2 puffs into the lungs every 6 (six) hours as needed for wheezing or shortness of breath. 11/20/20   Alford Highland, MD  ?aspirin EC  81 MG tablet Take 81 mg by mouth daily.     [provider]  ?carbidopa-levodopa (SINEMET IR) 25-100 MG tablet Take 1 tablet by mouth at bedtime. 04/19/22   Kara Dies, NP  ?cetirizine (ZYRTEC) 10 MG tablet Take 1 tablet (10 mg total) by mouth daily. 06/16/21   Cuthriell, Delorise Royals, PA-C  ?clopidogrel (PLAVIX) 75 MG tablet Take 1 tablet (75 mg total) by mouth daily. ?Patient not taking: Reported on 03/20/2021 11/20/20   Alford Highland, MD  ?fluticasone [facility]) 50 MCG/ACT nasal spray Place 2 sprays into both nostrils daily. ?Patient not taking: Reported on  03/20/2021 11/20/20   Alford Highland, MD  ?furosemide (LASIX) 20 MG tablet Take 1 tablet (20 mg total) by mouth daily. ?Patient not taking: Reported on 03/20/2021 11/20/20   Alford Highland, MD  ?gabapentin (NEU

## 2022-04-21 NOTE — Assessment & Plan Note (Addendum)
No chest pain. ?- Aspirin 81 mg daily not resumed due to acute on chronic anemia ?- Resumed home nitroglycerin as needed for chest pain, metoprolol succinate 100 mg daily ?

## 2022-04-21 NOTE — ED Notes (Signed)
Blood consent from signed and understood by pt. Pt understands all risks. ?

## 2022-04-21 NOTE — ED Notes (Signed)
Blood transfusion started.  ?Consent obtained. Pt understands risks and signs to warrant reaction. Pt remains connected to all monitors. This RN will remain with pt during first 15 min.  ?

## 2022-04-21 NOTE — Assessment & Plan Note (Addendum)
-   Resumed home GlycoLax daily as needed for mild and moderate constipation ?

## 2022-04-21 NOTE — Assessment & Plan Note (Signed)
-   Query secondary to acute anemia ?- Check procalcitonin, if elevated would recommend a.m. team initiate commune acquired pneumonia treatment ?

## 2022-04-21 NOTE — Hospital Course (Addendum)
Sheila Long is a 89 year old female with hypertension, pulmonary sarcoidosis, chronic bronchitis, hyperlipidemia, GERD, moderate aortic regurgitation, peripheral neuropathy, pulmonary hypertension, sleep apnea, who presents emergency department for chief concerns of weakness and malaise. ? ?She denies chest pain, shortness of breath, abdominal pain, dysuria, nausea, vomiting, fever, vision changes. ?  ?[name], feeling sick. ? ?Initial vitals in the emergency department showed temperature of 99.1, respiration rate 21, heart rate 75, blood pressure 130/54, SPO2 of 100% on room air. ? ?Serum sodium is 140, potassium 3.9, chloride 112, bicarb 23, nonfasting blood glucose 164, BUN 17, serum creatinine of 1.18, GFR 42, WBC 6.3, hemoglobin 7.4, platelets of 296. ? ?BNP was 385.5.  High sensitive troponin was 13. ? ?COVID/influenza A/influenza B PCR were negative. ? ?Portable chest x-ray: Left lower lobe opacity could reflect atelectasis or developing pneumonia.  Small left pleural effusion. ? ?ED treatment: EDP ordered 1 unit of PRBC for transfusion. ? ?He was admitted for acute on chronic anemia.  Anemia panel with vitamin B12 deficiency for which she was already started supplement.  B12 at 153. ?Procalcitonin was negative. ?Hemoglobin improved to 8.7 after getting 1 unit of PRBC. ?No obvious GI bleed, patient was using infrequently some Aleve.  Having some intermittent black-colored stools which she heart attributed to the use of B12 as this started after she started her supplement.  No history of iron use.  Unable to see any B12 supplement in MedRec. ?Lower extremities venous Doppler was negative for DVT. ? ?Anemia panel with B12 deficiency, borderline ferritin and normal iron studies. ?She received 1 dose of IM B12 and started on p.o. supplement. ?She was also started on iron supplement due to concern of chronic intestinal bleed/malignancy but she does not want to undergo  any invasive procedures and denies any EGD or colonoscopy. ?Patient can follow-up with GI as an outpatient and undergo diagnostic EGD and colonoscopy if he agrees to proceed as an outpatient. ?Home aspirin and Plavix were discontinued. ? ?She will continue her current medication and follow-up with her providers. ?

## 2022-04-21 NOTE — Assessment & Plan Note (Signed)
-   Resumed home carbidopa-levodopa, 25-100 mg nightly ?

## 2022-04-21 NOTE — Assessment & Plan Note (Addendum)
-   Patient endorses worsening bilateral lower extremity pain and swelling since Monday, 04/15/2022 ?Lower extremity venous Doppler was negative for DVT. ?

## 2022-04-21 NOTE — ED Notes (Signed)
Pt tolerating transfusion well thus far. No signs of suspected reaction. Pt remains connected to all monitors with call light within reach.  ?

## 2022-04-21 NOTE — Assessment & Plan Note (Addendum)
Blood pressure intermittently mildly elevated. ?- Resumed home olmesartan-hydrochlorothiazide 20-12.5 mg daily, metoprolol succinate 100 mg daily ?

## 2022-04-21 NOTE — ED Triage Notes (Signed)
Pt arrives via EMS from home- pt has not been feeling good the past week and it got worse today- pt VSS per EMS- pt cbg was 169- pt states that she gets winded when she walks to the bathroom  ?

## 2022-04-21 NOTE — Assessment & Plan Note (Addendum)
-   Chronic normocytic anemia, baseline hemoglobin in 2022 has been 9.6-11 ?- Query upper GI bleed versus slow lower GI bleed, unknown how much she will get the benefit from colonoscopy based on her age but she is independent at baseline.  Will defer that decision to GI ?Anemia panel with vitamin B12 deficiency ?- Hemoglobin improved to 8.7 after 1 unit of PRBC ?- Protonix infusion was switched with twice daily Protonix ?- Monitor hemoglobin ?-Start her on B12 supplement with IV while in the hospital. ? ?

## 2022-04-22 ENCOUNTER — Observation Stay: Payer: PPO

## 2022-04-22 DIAGNOSIS — D649 Anemia, unspecified: Secondary | ICD-10-CM

## 2022-04-22 DIAGNOSIS — M7989 Other specified soft tissue disorders: Secondary | ICD-10-CM | POA: Diagnosis not present

## 2022-04-22 DIAGNOSIS — M79661 Pain in right lower leg: Secondary | ICD-10-CM | POA: Diagnosis not present

## 2022-04-22 LAB — BASIC METABOLIC PANEL
Anion gap: 6 (ref 5–15)
BUN: 15 mg/dL (ref 8–23)
CO2: 23 mmol/L (ref 22–32)
Calcium: 8.7 mg/dL — ABNORMAL LOW (ref 8.9–10.3)
Chloride: 112 mmol/L — ABNORMAL HIGH (ref 98–111)
Creatinine, Ser: 1.09 mg/dL — ABNORMAL HIGH (ref 0.44–1.00)
GFR, Estimated: 46 mL/min — ABNORMAL LOW (ref >60)
Glucose, Bld: 105 mg/dL — ABNORMAL HIGH (ref 70–99)
Potassium: 3.8 mmol/L (ref 3.5–5.1)
Sodium: 141 mmol/L (ref 135–145)

## 2022-04-22 LAB — TYPE AND SCREEN
ABO/RH(D): O POS
Antibody Screen: NEGATIVE
Unit division: 0

## 2022-04-22 LAB — IRON AND TIBC
Iron: 71 ug/dL (ref 28–170)
Saturation Ratios: 17 % (ref 10.4–31.8)
TIBC: 424 ug/dL (ref 250–450)
UIBC: 353 ug/dL

## 2022-04-22 LAB — URINALYSIS, ROUTINE W REFLEX MICROSCOPIC
Bilirubin Urine: NEGATIVE
Glucose, UA: NEGATIVE mg/dL
Hgb urine dipstick: NEGATIVE
Ketones, ur: NEGATIVE mg/dL
Nitrite: NEGATIVE
Protein, ur: NEGATIVE mg/dL
Specific Gravity, Urine: 1.006 (ref 1.005–1.030)
pH: 5 (ref 5.0–8.0)

## 2022-04-22 LAB — PROTIME-INR
INR: 1.1 (ref 0.8–1.2)
Prothrombin Time: 13.7 seconds (ref 11.4–15.2)

## 2022-04-22 LAB — BPAM RBC
Blood Product Expiration Date: 202305252359
ISSUE DATE / TIME: 202304232047
Unit Type and Rh: 5100

## 2022-04-22 LAB — VITAMIN B12: Vitamin B-12: 153 pg/mL — ABNORMAL LOW (ref 180–914)

## 2022-04-22 LAB — CBC
HCT: 30.2 % — ABNORMAL LOW (ref 36.0–46.0)
Hemoglobin: 8.7 g/dL — ABNORMAL LOW (ref 12.0–15.0)
MCH: 26.5 pg (ref 26.0–34.0)
MCHC: 28.8 g/dL — ABNORMAL LOW (ref 30.0–36.0)
MCV: 92.1 fL (ref 80.0–100.0)
Platelets: 273 10*3/uL (ref 150–400)
RBC: 3.28 MIL/uL — ABNORMAL LOW (ref 3.87–5.11)
RDW: 17.1 % — ABNORMAL HIGH (ref 11.5–15.5)
WBC: 6.5 10*3/uL (ref 4.0–10.5)
nRBC: 0 % (ref 0.0–0.2)

## 2022-04-22 LAB — RETICULOCYTES
Immature Retic Fract: 27 % — ABNORMAL HIGH (ref 2.3–15.9)
RBC.: 3.25 MIL/uL — ABNORMAL LOW (ref 3.87–5.11)
Retic Count, Absolute: 81.9 10*3/uL (ref 19.0–186.0)
Retic Ct Pct: 2.5 % (ref 0.4–3.1)

## 2022-04-22 LAB — APTT: aPTT: 28 seconds (ref 24–36)

## 2022-04-22 LAB — PROCALCITONIN: Procalcitonin: <0.1 ng/mL

## 2022-04-22 LAB — FOLATE: Folate: 12 ng/mL (ref >5.9)

## 2022-04-22 LAB — FERRITIN: Ferritin: 13 ng/mL (ref 11–307)

## 2022-04-22 MED ORDER — PANTOPRAZOLE SODIUM 40 MG IV SOLR
40.0000 mg | Freq: Two times a day (BID) | INTRAVENOUS | Status: DC
Start: 2022-04-22 — End: 2022-04-23
  Administered 2022-04-22 – 2022-04-23 (×3): 40 mg via INTRAVENOUS
  Filled 2022-04-22 (×3): qty 10

## 2022-04-22 NOTE — Assessment & Plan Note (Signed)
Currently on twice daily Protonix. ?

## 2022-04-22 NOTE — Progress Notes (Signed)
?Progress Note ? ? ?[name] DOB: May 21, 1924 DOA: 04/21/2022     0 ?DOS: the patient was seen and examined on 04/22/2022 ?  ?Brief hospital course: ?[name] is a 88 year old female with hypertension, pulmonary sarcoidosis, chronic bronchitis, hyperlipidemia, GERD, moderate aortic regurgitation, peripheral neuropathy, pulmonary hypertension, sleep apnea, who presents emergency department for chief concerns of weakness and malaise. ? ?She denies chest pain, shortness of breath, abdominal pain, dysuria, nausea, vomiting, fever, vision changes. ?  ?[name], feeling sick. ? ?Initial vitals in the emergency department showed temperature of 99.1, respiration rate 21, heart rate 75, blood pressure 130/54, SPO2 of 100% on room air. ? ?Serum sodium is 140, potassium 3.9, chloride 112, bicarb 23, nonfasting blood glucose 164, BUN 17, serum creatinine of 1.18, GFR 42, WBC 6.3, hemoglobin 7.4, platelets of 296. ? ?BNP was 385.5.  High sensitive troponin was 13. ? ?COVID/influenza A/influenza B PCR were negative. ? ?Portable chest x-ray: Left lower lobe opacity could reflect atelectasis or developing pneumonia.  Small left pleural effusion. ? ?ED treatment: EDP ordered 1 unit of PRBC for transfusion. ? ?He was admitted for acute on chronic anemia.  Anemia panel with vitamin B12 deficiency for which she was already started supplement.  B12 at 153. ?Procalcitonin was negative. ?Hemoglobin improved to 8.7 after getting 1 unit of PRBC. ?No obvious GI bleed, patient was using infrequently some Aleve.  Having some intermittent black-colored stools which she heart attributed to the use of B12 as this started after she started her supplement.  No history of iron use.  Unable to see any B12 supplement in Med[facility]. ?Lower extremities venous Doppler was negative for DVT. ?Pending GI evaluation. ?We will give her IM B12. ? ? ?Assessment and Plan: ?* Acute on  chronic anemia ?- Chronic normocytic anemia, baseline hemoglobin in 2022 has been 9.6-11 ?- Query upper GI bleed versus slow lower GI bleed, unknown how much she will get the benefit from colonoscopy based on her age but she is independent at baseline.  Will defer that decision to GI ?Anemia panel with vitamin B12 deficiency ?- Hemoglobin improved to 8.7 after 1 unit of PRBC ?- Protonix infusion was switched with twice daily Protonix ?- Monitor hemoglobin ?-Start her on B12 supplement with IV while in the hospital. ? ? ?Parkinson disease ([place]) ?- Resumed home carbidopa-levodopa, 25-100 mg nightly ? ?Hypertension ?Blood pressure intermittently mildly elevated. ?- Resumed home olmesartan-hydrochlorothiazide 20-12.5 mg daily, metoprolol succinate 100 mg daily ? ?Coronary artery disease ?No chest pain. ?- Aspirin 81 mg daily not resumed due to acute on chronic anemia ?- Resumed home nitroglycerin as needed for chest pain, metoprolol succinate 100 mg daily ? ?GERD (gastroesophageal reflux disease) ?Currently on twice daily Protonix. ? ?Weakness ?- Query secondary to acute anemia ?- Check procalcitonin, if elevated would recommend a.m. team initiate commune acquired pneumonia treatment ? ?Leg swelling ?- Patient endorses worsening bilateral lower extremity pain and swelling since Monday, 04/15/2022 ?Lower extremity venous Doppler was negative for DVT. ? ?Constipation ?- Resumed home GlycoLax daily as needed for mild and moderate constipation ? ?  ? ?Subjective: Patient was seen and examined today.  Per patient she was getting intermittent black stools since she started B12 supplement, denies any iron intake, unable to see the medicine in her chart.  Denies any nausea or vomiting at this time but did had some nausea and vomiting about a week ago. ?No regular NSAID use except occasional Aleve.  She mostly  uses Tylenol for aches and pains. ? ?Physical Exam: ?Vitals:  ? 04/22/22 0230 04/22/22 0803 04/22/22 1202 04/22/22 1252   ?BP:  133/69 (!) 163/74 (!) 149/67  ?Pulse:  71 71 72  ?Resp: (!) 22 16 16    ?Temp:  98 ?F (36.7 ?C) 97.8 ?F (36.6 ?C)   ?TempSrc:      ?SpO2:  98% 100%   ?Weight:      ?Height:      ? ?General.  Pleasant elderly lady, who stated much younger than her age, in no acute distress. ?Pulmonary.  Lungs clear bilaterally, normal respiratory effort. ?CV.  Regular rate and rhythm, no JVD, rub or murmur. ?Abdomen.  Soft, nontender, nondistended, BS positive. ?CNS.  Alert and oriented .  No focal neurologic deficit. ?Extremities.  No edema, no cyanosis, pulses intact and symmetrical. ?Psychiatry.  Judgment and insight appears normal. ? ?Data Reviewed: ?Prior notes, labs and images reviewed. ? ?Family Communication:  ? ?Disposition: ?Status is: Observation ?The patient remains OBS appropriate and will d/c before 2 midnights. ? Planned Discharge Destination: Home ? ?DVT prophylaxis.  SCDs ?Time spent: 45 minutes ? ?This record has been created using Systems analyst. Errors have been sought and corrected,but may not always be located. Such creation errors do not reflect on the standard of care. ? ?Author: ?Sheila Nimrod, MD ?04/22/2022 2:55 PM ? ?For on call review [email or web address].  ?

## 2022-04-22 NOTE — Consult Note (Signed)
? ? ?Sheila Miniumarren Chelle Cayton, MD [facility]  ?[phone] ?[place], [place] [address] ?Phone: [phone] ?Fax : [phone] ? Consultation ? ?Referring Provider:     Dr. Nelson ChimesAmin ?Primary Care Physician:  Sheila DownsMasoud, Javed, MD ?Primary Gastroenterologist:  Dr. Tobi BastosAnna         ?Reason for Consultation:     GI bleed ? ?Date of Admission:  04/21/2022 ?Date of Consultation:  04/22/2022 ?       ? HPI:   ?Sheila Long is a 88 y.o. female who comes in with weakness and shortness of breath and was found to have a hemoglobin of 7.6 with guaiac positive stools.  It was reported that patient stools started to be black when she started taking B12 supplementation. The patient's iron studies showed all of her levels to be in the normal range. Her B12 was low at 153. Her reticulocyte count was also elevated. The patient had a upper endoscopy by Dr. Tobi BastosAnna in 2021 and had a colonoscopy 10 years ago by Dr. Bluford Kaufmannh with a large polyp in the rectum. 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 The patient has a history of pulmonary sarcoidosis chronic bronchitis, hyperlipidemia, GERD, aortic regurgitation and primary hypertension. The patient reports that she makes her own medical decisions with the help of a neighbor who was in the room at the time of my interview per ? ?Past Medical History:  ?Diagnosis Date  ? Allergic rhinitis due to pollen   ? Anxiety   ? Aortic atherosclerosis (HCC)   ? CHF (congestive heart failure) (HCC)   ? Coronary artery disease   ? Stent in 2000's (DC)  ? Dyspnea   ? GERD (gastroesophageal reflux disease)   ? Hard of hearing   ? Hypertension   ? Neuropathy   ? Obstructive sleep apnea   ? Osteoarthritis of both knees   ? Peripheral vascular disease (HCC)   ? Sarcoidosis   ? Thyroid nodule   ? ? ?Past Surgical History:  ?Procedure Laterality Date  ? ABDOMINAL HYSTERECTOMY    ? BIOPSY THYROID  2014  ? Dr Andee PolesVaught  ? BREAST EXCISIONAL BIOPSY Left 1980s  ? surgical bx neg  ? BREAST EXCISIONAL BIOPSY Left 1980s  ?  benign  ? CARDIOVASCULAR STRESS TEST  05/12/2012  ? with dobutamine---negative. Normal LV function  ? CORONARY ANGIOPLASTY  2006  ? ESOPHAGOGASTRODUODENOSCOPY (EGD) WITH PROPOFOL N/A 04/03/2020  ? Procedure: ESOPHAGOGASTRODUODENOSCOPY (EGD) WITH PROPOFOL;  Surgeon: Wyline Long, Kiran, MD;  [facility];  Service: Gastroenterology;  Laterality: N/A;  ? JOINT REPLACEMENT Left 2000  ? hip  ? JOINT REPLACEMENT Right 2007  ? hip  ? KNEE ARTHROPLASTY Right 06/23/2017  ? Procedure: COMPUTER ASSISTED TOTAL KNEE ARTHROPLASTY;  Surgeon: Donato HeinzHooten, Sheila P, MD;  Location: [facility] ORS;  Service: Orthopedics;  Laterality: Right;  ? ? ?Prior to Admission medications   ?Medication Sig Start Date End Date Taking? Authorizing Provider  ?aspirin EC 81 MG tablet Take 81 mg by mouth daily.    Yes [provider]  ?cetirizine (ZYRTEC) 10 MG tablet Take 1 tablet (10 mg total) by mouth daily. 06/16/21  Yes Cuthriell, Sheila RoyalsJonathan D, PA-C  ?furosemide (LASIX) 20 MG tablet Take 1 tablet (20 mg total) by mouth daily. 11/20/20  Yes Wieting, Richard, MD  ?latanoprost (XALATAN) 0.005 % ophthalmic solution Place 1 drop into both eyes at bedtime. 05/30/17  Yes [provider]  ?metoprolol succinate (TOPROL-XL) 100 MG 24 hr tablet TAKE 1 TABLET BY MOUTH EVERY DAY  04/26/21  Yes Sheila, Sheila Rolls, MD  ?olmesartan-hydrochlorothiazide (BENICAR HCT) 20-12.5 MG tablet Take 1 tablet by mouth daily. 11/19/21  Yes Sheila, Sheila Rolls, MD  ?Vitamin D, Ergocalciferol, (DRISDOL) 1.25 MG (50000 UNIT) CAPS capsule Take 50,000 Units by mouth once a week. 12/20/21  Yes [provider]  ?albuterol (VENTOLIN HFA) 108 (90 Base) MCG/ACT inhaler Inhale 2 puffs into the lungs every 6 (six) hours as needed for wheezing or shortness of breath. 11/20/20   Alford Highland, MD  ?carbidopa-levodopa (SINEMET IR) 25-100 MG tablet Take 1 tablet by mouth at bedtime. ?Patient not taking: Reported on 04/21/2022 04/19/22   Sheila Dies, NP  ?clopidogrel (PLAVIX) 75 MG  tablet Take 1 tablet (75 mg total) by mouth daily. ?Patient not taking: Reported on 03/20/2021 11/20/20   Alford Highland, MD  ?fluticasone [facility]) 50 MCG/ACT nasal spray Place 2 sprays into both nostrils daily. ?Patient not taking: Reported on 03/20/2021 11/20/20   Alford Highland, MD  ?gabapentin (NEURONTIN) 100 MG capsule Take by mouth. ?Patient not taking: Reported on 01/09/2021 12/11/20   [provider]  ?irbesartan (AVAPRO) 75 MG tablet Take 1 tablet (75 mg total) by mouth daily. ?Patient not taking: Reported on 03/20/2021 11/20/20   Alford Highland, MD  ?isosorbide mononitrate (IMDUR) 30 MG 24 hr tablet Take 1 tablet (30 mg total) by mouth daily. ?Patient not taking: Reported on 12/26/2020 11/20/20   Alford Highland, MD  ?meclizine (ANTIVERT) 12.5 MG tablet Take 1 tablet (12.5 mg total) by mouth 3 (three) times daily as needed for dizziness. 06/16/21   Cuthriell, Sheila Royals, PA-C  ?nitroGLYCERIN (NITROSTAT) 0.4 MG SL tablet Place 1 tablet (0.4 mg total) under the tongue every 5 (five) minutes as needed for chest pain. ?Patient not taking: Reported on 01/09/2021 11/20/20   Alford Highland, MD  ?pantoprazole (PROTONIX) 40 MG tablet Take 1 tablet (40 mg total) by mouth daily. ?Patient not taking: Reported on 04/21/2022 03/13/21   Sheila Downs, MD  ?polyethylene glycol (MIRALAX / GLYCOLAX) 17 g packet Take 17 g by mouth daily as needed for severe constipation. ?Patient not taking: Reported on 12/26/2020 11/20/20   Alford Highland, MD  ? ? ?Family History  ?Problem Relation Age of Onset  ? Cancer Sister 82  ?     unknown - groin area  ? Heart attack Brother   ? Cancer Sister 68  ?     pancreatic   ? Heart attack Sister   ? Heart attack Brother   ?  ? ?Social History  ? ?Tobacco Use  ? Smoking status: Never  ? Smokeless tobacco: Never  ?Vaping Use  ? Vaping Use: Never used  ?Substance Use Topics  ? Alcohol use: No  ? Drug use: No  ? ? ?Allergies as of 04/21/2022 - Review Complete 04/21/2022  ?Allergen  Reaction Noted  ? Tramadol Other (See Comments) 04/24/2016  ? Sulfa antibiotics  10/27/2013  ? Zocor [simvastatin] Other (See Comments) 04/04/2015  ? ? ?Review of Systems:    ?All systems reviewed and negative except where noted in HPI. ? ? Physical Exam:  ?Vital signs in last 24 hours: ?Temp:  [97.6 ?F (36.4 ?C)-98.8 ?F (37.1 ?C)] 98 ?F (36.7 ?C) (04/24 1500) ?Pulse Rate:  [68-93] 68 (04/24 1500) ?Resp:  [15-41] 15 (04/24 1500) ?BP: (112-163)/(47-74) 122/47 (04/24 1500) ?SpO2:  [95 %-100 %] 100 % (04/24 1500) ?Last BM Date : 04/21/22 ?General:   Pleasant, cooperative in NAD ?Head:  Normocephalic and atraumatic. ?Eyes:   No icterus.   Conjunctiva  pink. PERRLA. ?Ears:  Normal auditory acuity. ?Neck:  Supple; no masses or thyroidomegaly ?Lungs: Respirations even and unlabored. Lungs clear to auscultation bilaterally.   No wheezes, crackles, or rhonchi.  ?Heart:  Regular rate and rhythm;  Without murmur, clicks, rubs or gallops ?Abdomen:  Soft, nondistended, nontender. Normal bowel sounds. No appreciable masses or hepatomegaly.  No rebound or guarding.  ?Rectal:  Not performed. ?Msk:  Symmetrical without gross deformities.    ?Extremities:  Without edema, cyanosis or clubbing. ?Neurologic:  Alert and oriented x3;  grossly normal neurologically. ?Skin:  Intact without significant lesions or rashes. ?Cervical Nodes:  No significant cervical adenopathy. ?Psych:  Alert and cooperative. Normal affect. ? ?LAB RESULTS: ?Recent Labs  ?  04/21/22 ?1832 04/22/22 ?0243  ?WBC 6.3 6.5  ?HGB 7.4* 8.7*  ?HCT 25.2* 30.2*  ?PLT 296 273  ? ?BMET ?Recent Labs  ?  04/21/22 ?1832 04/22/22 ?0243  ?NA 140 141  ?K 3.9 3.8  ?CL 112* 112*  ?CO2 23 23  ?GLUCOSE 164* 105*  ?BUN 17 15  ?CREATININE 1.18* 1.09*  ?CALCIUM 8.6* 8.7*  ? ?LFT ?Recent Labs  ?  04/21/22 ?1832  ?PROT 6.3*  ?ALBUMIN 3.5  ?AST 14*  ?ALT 9  ?ALKPHOS 64  ?BILITOT 0.9  ? ?PT/INR ?Recent Labs  ?  04/22/22 ?0243  ?LABPROT 13.7  ?INR 1.1  ? ? ?STUDIES: ?US Venous Img Lower  Bilateral (DVT) ? ?Result Date: 04/22/2022 ?CLINICAL DATA:  Bilateral lower extremity swelling and pain. EXAM: BILATERAL LOWER EXTREMITY VENOUS DOPPLER ULTRASOUND TECHNIQUE: Gray-scale sonography with graded compression

## 2022-04-22 NOTE — Care Management Obs Status (Signed)
MEDICARE OBSERVATION STATUS NOTIFICATION ? ? ?Patient Details  ?Name: Sheila Long ?MRN: [id number] ?Date of Birth: 1924-11-14 ? ? ?Medicare Observation Status Notification Given:  Yes ? ? ? ?Margarito Liner, LCSW ?04/22/2022, 4:36 PM ?

## 2022-04-23 DIAGNOSIS — D649 Anemia, unspecified: Secondary | ICD-10-CM | POA: Diagnosis not present

## 2022-04-23 LAB — PROCALCITONIN: Procalcitonin: <0.1 ng/mL

## 2022-04-23 LAB — CBC
HCT: 28.8 % — ABNORMAL LOW (ref 36.0–46.0)
Hemoglobin: 8.5 g/dL — ABNORMAL LOW (ref 12.0–15.0)
MCH: 26.4 pg (ref 26.0–34.0)
MCHC: 29.5 g/dL — ABNORMAL LOW (ref 30.0–36.0)
MCV: 89.4 fL (ref 80.0–100.0)
Platelets: 288 10*3/uL (ref 150–400)
RBC: 3.22 MIL/uL — ABNORMAL LOW (ref 3.87–5.11)
RDW: 17.1 % — ABNORMAL HIGH (ref 11.5–15.5)
WBC: 7 10*3/uL (ref 4.0–10.5)
nRBC: 0 % (ref 0.0–0.2)

## 2022-04-23 MED ORDER — CYANOCOBALAMIN 1000 MCG PO TABS: 1000.0000 ug | ORAL_TABLET | Freq: Every day | ORAL | 1 refills | Status: DC

## 2022-04-23 MED ORDER — FERROUS SULFATE 325 (65 FE) MG PO TBEC: 325.0000 mg | DELAYED_RELEASE_TABLET | Freq: Two times a day (BID) | ORAL | 3 refills | Status: DC

## 2022-04-23 MED ORDER — PANTOPRAZOLE SODIUM 40 MG PO TBEC: 40.0000 mg | DELAYED_RELEASE_TABLET | Freq: Two times a day (BID) | ORAL | 3 refills | Status: DC

## 2022-04-23 MED ORDER — VITAMIN B-12 1000 MCG PO TABS
1000.0000 ug | ORAL_TABLET | Freq: Every day | ORAL | Status: DC
  Administered 2022-04-23: 1000 ug via ORAL
  Filled 2022-04-23: qty 1

## 2022-04-23 NOTE — TOC Transition Note (Signed)
Transition of Care (TOC) - CM/SW Discharge Note ? ? ?Patient Details  ?Name: Sheila Long ?MRN: [id number] ?Date of Birth: [birth year] ? ?Transition of Care (TOC) CM/SW Contact:  ?Margarito Liner, LCSW ?Phone Number: ?04/23/2022, 1:54 PM ? ? ?Clinical Narrative:  Patient has orders to discharge home today. Set up with [facility] for PT, RN, aide. Information added to AVS. No further concerns. CSW signing off.  ? ?Final next level of care: Home w Home Health Services ?Barriers to Discharge: Barriers Resolved ? ? ?Patient Goals and CMS Choice ?  ?CMS Medicare.gov Compare Post Acute Care list provided to:: Patient ?  ? ?Discharge Placement ?  ?           ?  ?Patient to be transferred to facility by: Neighbor ?  ?Patient and family notified of of transfer: 04/23/22 ? ?Discharge Plan and Services ?  ?  ?Post Acute Care Choice: Home Health          ?  ?  ?  ?  ?  ?HH Arranged: Charity fundraiser, PT, Nurse's Aide ?HH Agency: [facility] ?Date HH Agency Contacted: 04/23/22 ?  ?Representative spoke with at [facility] Agency: Lorenza Chick ? ?Social Determinants of Health (SDOH) Interventions ?  ? ? ?Readmission Risk Interventions ?   ? View : No data to display.  ?  ?  ?  ? ? ? ? ? ?

## 2022-04-23 NOTE — TOC Initial Note (Addendum)
Transition of Care (TOC) - Initial/Assessment Note  ? ? ?Patient Details  ?Name: Sheila Long ?MRN: [id number] ?Date of Birth: 1924/10/29 ? ?Transition of Care (TOC) CM/SW Contact:    ?Candie Chroman, LCSW ?Phone Number: ?04/23/2022, 12:30 PM ? ?Clinical Narrative:  CSW met with patient. No supports at bedside. CSW introduced role and explained that PT recommendations would be discussed. Patient is agreeable to home health recommendations. No agency preference. [facility] is reviewing referral. No DME recommendations per PT. Patient said her neighbor will be picking her up: Sheila Long.  ? ?1:40 pm: [facility] is unable to accept referral. [facility] Lemmings is checking to see if they can accept. Left messages for [employer] and [employer]. ? ?Expected Discharge Plan: [place] ?Barriers to Discharge:  (Finding home health) ? ? ?Patient Goals and CMS Choice ?  ?CMS Medicare.gov Compare Post Acute Care list provided to:: Patient ?  ? ?Expected Discharge Plan and Services ?Expected Discharge Plan: [place] ?  ?  ?Post Acute Care Choice: Home Health ?Living arrangements for the past 2 months: [place] ?Expected Discharge Date: 04/23/22               ?  ?  ?  ?  ?  ?  ?  ?  ?  ?  ? ?Prior Living Arrangements/Services ?Living arrangements for the past 2 months: [place] ?Lives with:: Self ?Patient language and need for interpreter reviewed:: Yes ?Do you feel safe going back to the place where you live?: Yes      ?Need for Family Participation in Patient Care: Yes (Comment) ?Care giver support system in place?: Yes (comment) ?Current home services: DME ?Criminal Activity/Legal Involvement Pertinent to Current Situation/Hospitalization: No - Comment as needed ? ?Activities of Daily Living ?Home Assistive Devices/Equipment: Cane (specify quad or straight), Walker (specify type) ?ADL Screening (condition at time of admission) ?Patient's cognitive ability adequate  to safely complete daily activities?: Yes ?Is the patient deaf or have difficulty hearing?: Yes ?Does the patient have difficulty seeing, even when wearing glasses/contacts?: No ?Does the patient have difficulty concentrating, remembering, or making decisions?: No ?Patient able to express need for assistance with ADLs?: Yes ?Does the patient have difficulty dressing or bathing?: No ?Independently performs ADLs?: Yes (appropriate for developmental age) ?Does the patient have difficulty walking or climbing stairs?: Yes ?Weakness of Legs: Both ?Weakness of Arms/Hands: None ? ?Permission Sought/Granted ?Permission sought to share information with : Customer service manager ?Permission granted to share information with : Yes, Verbal Permission Granted ?   ? Permission granted to share info w AGENCY: Home health agencies ?   ?   ? ?Emotional Assessment ?Appearance:: Appears stated age ?Attitude/Demeanor/Rapport: Engaged, Gracious ?Affect (typically observed): Accepting, Appropriate, Calm, Pleasant ?Orientation: : Oriented to Self, Oriented to Place, Oriented to  Time, Oriented to Situation ?Alcohol / Substance Use: Not Applicable ?Psych Involvement: No (comment) ? ?Admission diagnosis:  Weakness [R53.1] ?Gastrointestinal hemorrhage, unspecified gastrointestinal hemorrhage type [K92.2] ?Patient Active Problem List  ? Diagnosis Date Noted  ? Weakness 04/21/2022  ? Acute on chronic anemia 04/21/2022  ? Parkinson disease ([place]) 04/21/2022  ? Leg swelling 04/21/2022  ? Dizziness 01/15/2021  ? Carpal tunnel syndrome 12/11/2020  ? PND (post-nasal drip)   ? Stenosis of right carotid artery   ? Acute kidney injury superimposed on CKD ([place])   ? NSTEMI (non-ST elevated myocardial infarction) ([place]) 11/18/2020  ? [name] 11/17/2020  ? Back pain without sciatica  11/07/2020  ? Numbness in both hands 11/07/2020  ? Numbness 10/05/2019  ? Axillary abscess 11/12/2018  ? Status post total replacement of both hips 10/04/2018   ? Prediabetes 10/30/2017  ? Acute on chronic diastolic CHF (congestive heart failure) ([place]) 10/29/2017  ? Fatigue 07/29/2017  ? Mood disorder ([place]) 07/29/2017  ? Constipation 06/26/2017  ? S/P total knee arthroplasty 06/23/2017  ? Pulmonary hypertension associated with sarcoidosis ([place]) 02/03/2017  ? Lower extremity edema 11/28/2016  ? Familial tremor 09/24/2016  ? Sarcoidosis   ? Hypertension   ? Allergic rhinitis due to pollen   ? Coronary artery disease   ? Aortic atherosclerosis ([place])   ? GERD (gastroesophageal reflux disease)   ? Peripheral vascular disease ([place])   ? Osteoarthritis of both knees   ? Obstructive sleep apnea   ? Neuropathy   ? Dyslipidemia 11/19/2013  ? Thyroid nodule 11/19/2013  ? ?PCP:  Cletis [place], MD ?Pharmacy:   ?[id number] [place], [place] - [address] ?[address] ?[place]?Phone: [phone]: [phone]? ?[facility] [address] [place]?Phone: [phone]: [phone]? ? ? ? ?Social Determinants of Health (SDOH) Interventions ?  ? ?Readmission Risk Interventions ?   ? View : No data to display.  ?  ?  ?  ? ? ? ?

## 2022-04-23 NOTE — Evaluation (Signed)
Physical Therapy Evaluation ?Patient Details ?Name: Sheila Long ?MRN: [id number] ?DOB: 12/14/1924 ?Today's Date: 04/23/2022 ? ?History of Present Illness ? presented to ER secondary to generalized weakness, malaise; admitted for management of acute/chronic anemia (HgB improved after trasfusion)  ?Clinical Impression ? Patient resting in bed upon arrival to session; alert and oriented, follows commands and agreeable to OOB activities.  Eager for upcoming discharge.  Denies pain and does endorse generalized improvement since admission.  Bilat UE/LE strength and ROM grossly symmetrical and WFL; no focal weakness appreciated.  Able to complete bed mobility with cga; sit/stand, basic transfers and gait (110') with RW, cga/close sup.  Demonstrates reciprocal stepping pattern with fair step height/length; mild SOB with exertion, good awareness of need for activity pacing and rest periods as needed.  No overt buckling or LOB; fair/good confidence with gait abilities.  Do recommend continued use of RW for all gait at this time due to persistent weakness, endurance deficits; patient voiced understanding and agreement.   ?Mild SOB reported with exertional activities; sats >97% on RA.  Did review home safety modifications, activity pacing and energy conservation strategies.  Patient voiced understanding of all information. ?Would benefit from skilled PT to address above deficits and promote optimal return to PLOF.; Recommend transition to HHPT upon discharge from acute hospitalization. ?   ?   ? ?Recommendations for follow up therapy are one component of a multi-disciplinary discharge planning process, led by the attending physician.  Recommendations may be updated based on patient status, additional functional criteria and insurance authorization. ? ?Follow Up Recommendations Home health PT ? ?  ?Assistance Recommended at Discharge PRN  ?Patient can return home with the following ? A little help with walking and/or  transfers;A little help with bathing/dressing/bathroom ? ?  ?Equipment Recommendations  (has SPC, RW and WC; no additional equipment)  ?Recommendations for Other Services ?    ?  ?Functional Status Assessment Patient has had a recent decline in their functional status and demonstrates the ability to make significant improvements in function in a reasonable and predictable amount of time.  ? ?  ?Precautions / Restrictions Precautions ?Precautions: Fall ?Restrictions ?Weight Bearing Restrictions: No  ? ?  ? ?Mobility ? Bed Mobility ?Overal bed mobility: Needs Assistance ?Bed Mobility: Supine to Sit ?  ?  ?Supine to sit: Min guard ?  ?  ?  ?  ? ?Transfers ?Overall transfer level: Needs assistance ?Equipment used: Rolling walker (2 wheels) ?Transfers: Sit to/from Stand ?Sit to Stand: Min guard ?  ?  ?  ?  ?  ?  ?  ? ?Ambulation/Gait ?Ambulation/Gait assistance: Min guard ?Gait Distance (Feet): 110 Feet ?Assistive device: Rolling walker (2 wheels) ?  ?  ?  ?  ?General Gait Details: reciprocal stepping pattern with fair step height/length; mild SOB with exertion, good awareness of need for activity pacing and rest periods as needed.  No overt buckling or LOB; fair/good confidence with gait abilities.  Do recommend continued use of RW for all gait at this time due to persistent weakness, endurance deficits; patient voiced understanding and agreement ? ?Stairs ?  ?  ?  ?  ?  ? ?Wheelchair Mobility ?  ? ?Modified Rankin (Stroke Patients Only) ?  ? ?  ? ?Balance Overall balance assessment: Needs assistance ?Sitting-balance support: No upper extremity supported, Feet supported ?Sitting balance-Leahy Scale: Good ?  ?  ?Standing balance support: Bilateral upper extremity supported ?Standing balance-Leahy Scale: Fair ?  ?  ?  ?  ?  ?  ?  ?  ?  ?  ?  ?  ?   ? ? ? ?  Pertinent Vitals/Pain Pain Assessment ?Pain Assessment: No/denies pain  ? ? ?Home Living Family/patient expects to be discharged to:: Private residence ?Living  Arrangements: Alone ?Available Help at Discharge: Friend(s);Available PRN/intermittently ?Type of Home: House ?Home Access: Stairs to enter ?Entrance Stairs-Rails: Right ?Entrance Stairs-Number of Steps: 2 ?Alternate Level Stairs-Number of Steps: neighbors help with essential needs in basement level of home ?Home Layout: Multi-level;Laundry or work area in basement;Able to live on main level with bedroom/bathroom ?Home Equipment: Conservation officer, nature (2 wheels);Cane - single point;Wheelchair - manual ?   ?  ?Prior Function Prior Level of Function : Independent/Modified Independent ?  ?  ?  ?  ?  ?  ?Mobility Comments: Mod indep with SPC vs RW for household mobility; denies recent fall history. ?ADLs Comments: Mod indep with bathing routine (in walk-in shower); neighbors assist with cooking/cleaning and errands as needed. ?  ? ? ?Hand Dominance  ? Dominant Hand: Right ? ?  ?Extremity/Trunk Assessment  ? Upper Extremity Assessment ?Upper Extremity Assessment: Overall WFL for tasks assessed ?  ? ?Lower Extremity Assessment ?Lower Extremity Assessment: Overall WFL for tasks assessed (grossly at least 4/5 throughout) ?  ? ?   ?Communication  ? Communication: HOH  ?Cognition Arousal/Alertness: Awake/alert ?Behavior During Therapy: [facility] for tasks assessed/performed ?Overall Cognitive Status: Within Functional Limits for tasks assessed ?  ?  ?  ?  ?  ?  ?  ?  ?  ?  ?  ?  ?  ?  ?  ?  ?  ?  ?  ? ?  ?General Comments   ? ?  ?Exercises Other Exercises ?Other Exercises: Reviewed role of PT and progressive mobility, reviewed use of RW and recommendations for continued use; reviewed home safety modifications, role of activity pacing/energy conservation. Patient voiced understanding of all information.  ? ?Assessment/Plan  ?  ?PT Assessment Patient needs continued PT services  ?PT Problem List Decreased activity tolerance;Decreased balance;Decreased mobility ? ?   ?  ?PT Treatment Interventions DME instruction;Gait training;Stair  training;Functional mobility training;Therapeutic activities;Therapeutic exercise;Balance training;Patient/family education   ? ?PT Goals (Current goals can be found in the Care Plan section)  ?Acute Rehab PT Goals ?Patient Stated Goal: to return home ?PT Goal Formulation: With patient ?Time For Goal Achievement: 05/07/22 ?Potential to Achieve Goals: Good ? ?  ?Frequency Min 2X/week ?  ? ? ?Co-evaluation   ?  ?  ?  ?  ? ? ?  ?AM-PAC PT "6 Clicks" Mobility  ?Outcome Measure Help needed turning from your back to your side while in a flat bed without using bedrails?: None ?Help needed moving from lying on your back to sitting on the side of a flat bed without using bedrails?: A Little ?Help needed moving to and from a bed to a chair (including a wheelchair)?: A Little ?Help needed standing up from a chair using your arms (e.g., wheelchair or bedside chair)?: A Little ?Help needed to walk in hospital room?: A Little ?Help needed climbing 3-5 steps with a railing? : A Little ?6 Click Score: 19 ? ?  ?End of Session Equipment Utilized During Treatment: Gait belt ?Activity Tolerance: Patient tolerated treatment well ?Patient left: in chair;with call bell/phone within reach;with chair alarm set ?Nurse Communication: Mobility status ?PT Visit Diagnosis: Muscle weakness (generalized) (M62.81);Difficulty in walking, not elsewhere classified (R26.2) ?  ? ?Time: [id number] ?PT Time Calculation (min) (ACUTE ONLY): 20 min ? ? ?Charges:   PT Evaluation ?$PT Eval Moderate Complexity: 1 Mod ?PT Treatments ?$Therapeutic Activity:  8-22 mins ?  ?   ? ?Marlaya Turck H. Owens Shark, PT, DPT, NCS ?04/23/22, 3:08 PM ?[phone] ? ? ?

## 2022-04-23 NOTE — Discharge Summary (Signed)
?Physician Discharge Summary ?  ?[name] MRN: [id number] DOB: 12/24/1924  ?Admit date:     04/21/2022  ?Discharge date: 04/23/22  ?Discharge Physician: Arnetha CourserSumayya Kiara Keep  ? ?PCP: Corky DownsMasoud, Javed, MD  ? ?Recommendations at discharge:  ?Please obtain CBC and BMP in 1 week ?Aspirin and Plavix were discontinued for concern of GI bleed. ?Follow-up with primary care provider within a week ?Follow-up with gastroenterology if she decided to go for colonoscopy and EGD. ? ?Discharge Diagnoses: ?Principal Problem: ?  Acute on chronic anemia ?Active Problems: ?  Parkinson disease (HCC) ?  Hypertension ?  Coronary artery disease ?  GERD (gastroesophageal reflux disease) ?  Weakness ?  Sarcoidosis ?  Pulmonary hypertension associated with sarcoidosis (HCC) ?  Constipation ?  Dyslipidemia ?  Leg swelling ? ?Resolved Problems: ?  Anxiety ? ?Hospital Course: ?[name] is a 88 year old female with hypertension, pulmonary sarcoidosis, chronic bronchitis, hyperlipidemia, GERD, moderate aortic regurgitation, peripheral neuropathy, pulmonary hypertension, sleep apnea, who presents emergency department for chief concerns of weakness and malaise. ? ?She denies chest pain, shortness of breath, abdominal pain, dysuria, nausea, vomiting, fever, vision changes. ?  ?[name], feeling sick. ? ?Initial vitals in the emergency department showed temperature of 99.1, respiration rate 21, heart rate 75, blood pressure 130/54, SPO2 of 100% on room air. ? ?Serum sodium is 140, potassium 3.9, chloride 112, bicarb 23, nonfasting blood glucose 164, BUN 17, serum creatinine of 1.18, GFR 42, WBC 6.3, hemoglobin 7.4, platelets of 296. ? ?BNP was 385.5.  High sensitive troponin was 13. ? ?COVID/influenza A/influenza B PCR were negative. ? ?Portable chest x-ray: Left lower lobe opacity could reflect atelectasis or developing pneumonia.  Small left pleural effusion. ? ?ED treatment: EDP ordered 1 unit  of PRBC for transfusion. ? ?He was admitted for acute on chronic anemia.  Anemia panel with vitamin B12 deficiency for which she was already started supplement.  B12 at 153. ?Procalcitonin was negative. ?Hemoglobin improved to 8.7 after getting 1 unit of PRBC. ?No obvious GI bleed, patient was using infrequently some Aleve.  Having some intermittent black-colored stools which she heart attributed to the use of B12 as this started after she started her supplement.  No history of iron use.  Unable to see any B12 supplement in [facility]. ?Lower extremities venous Doppler was negative for DVT. ? ?Anemia panel with B12 deficiency, borderline ferritin and normal iron studies. ?She received 1 dose of IM B12 and started on p.o. supplement. ?She was also started on iron supplement due to concern of chronic intestinal bleed/malignancy but she does not want to undergo any invasive procedures and denies any EGD or colonoscopy. ?Patient can follow-up with GI as an outpatient and undergo diagnostic EGD and colonoscopy if he agrees to proceed as an outpatient. ?Home aspirin and Plavix were discontinued. ? ?She will continue her current medication and follow-up with her providers. ? ?Assessment and Plan: ?* Acute on chronic anemia ?- Chronic normocytic anemia, baseline hemoglobin in 2022 has been 9.6-11 ?- Query upper GI bleed versus slow lower GI bleed, unknown how much she will get the benefit from colonoscopy based on her age but she is independent at baseline.  Will defer that decision to GI ?Anemia panel with vitamin B12 deficiency ?- Hemoglobin improved to 8.7 after 1 unit of PRBC ?- Protonix infusion was switched with twice daily Protonix ?- Monitor hemoglobin ?-Start her on B12 supplement with IV while in the hospital. ? ? ?Parkinson disease (HCC) ?-  Resumed home carbidopa-levodopa, 25-100 mg nightly ? ?Hypertension ?Blood pressure intermittently mildly elevated. ?- Resumed home olmesartan-hydrochlorothiazide 20-12.5 mg daily,  metoprolol succinate 100 mg daily ? ?Coronary artery disease ?No chest pain. ?- Aspirin 81 mg daily not resumed due to acute on chronic anemia ?- Resumed home nitroglycerin as needed for chest pain, metoprolol succinate 100 mg daily ? ?GERD (gastroesophageal reflux disease) ?Currently on twice daily Protonix. ? ?Weakness ?- Query secondary to acute anemia ?- Check procalcitonin, if elevated would recommend a.m. team initiate commune acquired pneumonia treatment ? ?Leg swelling ?- Patient endorses worsening bilateral lower extremity pain and swelling since Monday, 04/15/2022 ?Lower extremity venous Doppler was negative for DVT. ? ?Constipation ?- Resumed home GlycoLax daily as needed for mild and moderate constipation ? ? ?Consultants: Gastroenterology ?Procedures performed: None ?Disposition: Home health ?Diet recommendation:  ?Discharge Diet Orders (From admission, onward)  ? ?  Start     Ordered  ? 04/23/22 0000  Diet - low sodium heart healthy       ? 04/23/22 1225  ? ?  ?  ? ?  ? ?Cardiac diet ?DISCHARGE MEDICATION: ?Allergies as of 04/23/2022   ? ?   Reactions  ? Tramadol Other (See Comments)  ? CONFUSION  ? Sulfa Antibiotics   ? Patient does not recall  ? Zocor [simvastatin] Other (See Comments)  ? INTOLERANCE-MYALGIAS  ? ?  ? ?  ?Medication List  ?  ? ?STOP taking these medications   ? ?aspirin EC 81 MG tablet ?  ?clopidogrel 75 MG tablet ?Commonly known as: PLAVIX ?  ?gabapentin 100 MG capsule ?Commonly known as: NEURONTIN ?  ?isosorbide mononitrate 30 MG 24 hr tablet ?Commonly known as: IMDUR ?  ? ?  ? ?TAKE these medications   ? ?albuterol 108 (90 Base) MCG/ACT inhaler ?Commonly known as: VENTOLIN HFA ?Inhale 2 puffs into the lungs every 6 (six) hours as needed for wheezing or shortness of breath. ?  ?carbidopa-levodopa 25-100 MG tablet ?Commonly known as: SINEMET IR ?Take 1 tablet by mouth at bedtime. ?  ?cetirizine 10 MG tablet ?Commonly known as: ZYRTEC ?Take 1 tablet (10 mg total) by mouth daily. ?   ?cyanocobalamin 1000 MCG tablet ?Take 1 tablet (1,000 mcg total) by mouth daily. ?  ?ferrous sulfate 325 (65 FE) MG EC tablet ?Take 1 tablet (325 mg total) by mouth 2 (two) times daily. ?  ?fluticasone 50 MCG/ACT nasal spray ?Commonly known as: FLONASE ?Place 2 sprays into both nostrils daily. ?  ?furosemide 20 MG tablet ?Commonly known as: Lasix ?Take 1 tablet (20 mg total) by mouth daily. ?  ?latanoprost 0.005 % ophthalmic solution ?Commonly known as: XALATAN ?Place 1 drop into both eyes at bedtime. ?  ?meclizine 12.5 MG tablet ?Commonly known as: ANTIVERT ?Take 1 tablet (12.5 mg total) by mouth 3 (three) times daily as needed for dizziness. ?  ?metoprolol succinate 100 MG 24 hr tablet ?Commonly known as: TOPROL-XL ?TAKE 1 TABLET BY MOUTH EVERY DAY ?  ?nitroGLYCERIN 0.4 MG SL tablet ?Commonly known as: NITROSTAT ?Place 1 tablet (0.4 mg total) under the tongue every 5 (five) minutes as needed for chest pain. ?  ?olmesartan-hydrochlorothiazide 20-12.5 MG tablet ?Commonly known as: BENICAR HCT ?Take 1 tablet by mouth daily. ?  ?pantoprazole 40 MG tablet ?Commonly known as: PROTONIX ?Take 1 tablet (40 mg total) by mouth 2 (two) times daily. ?What changed: when to take this ?  ?polyethylene glycol 17 g packet ?Commonly known as: MIRALAX / GLYCOLAX ?Take 17 g  by mouth daily as needed for severe constipation. ?  ?Vitamin D (Ergocalciferol) 1.25 MG (50000 UNIT) Caps capsule ?Commonly known as: DRISDOL ?Take 50,000 Units by mouth once a week. ?  ? ?  ? ? Follow-up Information   ? ? [name], MD. Schedule an appointment as soon as possible for a visit in 1 week(s).   ?Specialties: Internal Medicine, Cardiology ?Contact information: ?[address] ?[place] [address] ?[phone] ? ? ?  ?  ? ?  ?  ? ?  ? ?Discharge Exam: ?Ceasar Mons Weights  ? 04/21/22 1829  ?Weight: 93.9 kg  ? ?General.     In no acute distress. ?Pulmonary.  Lungs clear bilaterally, normal respiratory effort. ?CV.  Regular rate and rhythm, no JVD, rub  or murmur. ?Abdomen.  Soft, nontender, nondistended, BS positive. ?CNS.  Alert and oriented .  No focal neurologic deficit. ?Extremities.  No edema, no cyanosis, pulses intact and symmetrical. ?Psychiatry.

## 2022-04-29 ENCOUNTER — Ambulatory Visit: Payer: PPO | Admitting: Internal Medicine

## 2022-04-29 DIAGNOSIS — M4807 Spinal stenosis, lumbosacral region: Secondary | ICD-10-CM | POA: Diagnosis not present

## 2022-04-29 DIAGNOSIS — I10 Essential (primary) hypertension: Secondary | ICD-10-CM | POA: Diagnosis not present

## 2022-04-29 DIAGNOSIS — N1832 Chronic kidney disease, stage 3b: Secondary | ICD-10-CM | POA: Diagnosis not present

## 2022-04-29 DIAGNOSIS — I25110 Atherosclerotic heart disease of native coronary artery with unstable angina pectoris: Secondary | ICD-10-CM | POA: Diagnosis not present

## 2022-04-29 DIAGNOSIS — Z09 Encounter for follow-up examination after completed treatment for conditions other than malignant neoplasm: Secondary | ICD-10-CM | POA: Diagnosis not present

## 2022-04-29 DIAGNOSIS — K921 Melena: Secondary | ICD-10-CM | POA: Diagnosis not present

## 2022-04-29 DIAGNOSIS — D518 Other vitamin B12 deficiency anemias: Secondary | ICD-10-CM | POA: Diagnosis not present

## 2022-05-14 ENCOUNTER — Other Ambulatory Visit: Payer: Self-pay

## 2022-05-14 ENCOUNTER — Emergency Department
Admission: EM | Admit: 2022-05-14 | Discharge: 2022-05-14 | Disposition: A | Discharge status: Home / Self Care | Payer: PPO | Attending: Emergency Medicine | Admitting: Emergency Medicine

## 2022-05-14 ENCOUNTER — Emergency Department: Payer: PPO

## 2022-05-14 DIAGNOSIS — R42 Dizziness and giddiness: Secondary | ICD-10-CM | POA: Diagnosis not present

## 2022-05-14 DIAGNOSIS — R110 Nausea: Secondary | ICD-10-CM | POA: Insufficient documentation

## 2022-05-14 DIAGNOSIS — R519 Headache, unspecified: Secondary | ICD-10-CM | POA: Diagnosis not present

## 2022-05-14 DIAGNOSIS — I10 Essential (primary) hypertension: Secondary | ICD-10-CM | POA: Diagnosis not present

## 2022-05-14 LAB — URINALYSIS, ROUTINE W REFLEX MICROSCOPIC
Bilirubin Urine: NEGATIVE
Glucose, UA: NEGATIVE mg/dL
Hgb urine dipstick: NEGATIVE
Ketones, ur: NEGATIVE mg/dL
Leukocytes,Ua: NEGATIVE
Nitrite: NEGATIVE
Protein, ur: NEGATIVE mg/dL
Specific Gravity, Urine: 1.011 (ref 1.005–1.030)
pH: 6 (ref 5.0–8.0)

## 2022-05-14 LAB — CBC
HCT: 29.8 % — ABNORMAL LOW (ref 36.0–46.0)
Hemoglobin: 8.8 g/dL — ABNORMAL LOW (ref 12.0–15.0)
MCH: 26.8 pg (ref 26.0–34.0)
MCHC: 29.5 g/dL — ABNORMAL LOW (ref 30.0–36.0)
MCV: 90.9 fL (ref 80.0–100.0)
Platelets: 243 10*3/uL (ref 150–400)
RBC: 3.28 MIL/uL — ABNORMAL LOW (ref 3.87–5.11)
RDW: 18.6 % — ABNORMAL HIGH (ref 11.5–15.5)
WBC: 6.7 10*3/uL (ref 4.0–10.5)
nRBC: 0 % (ref 0.0–0.2)

## 2022-05-14 LAB — COMPREHENSIVE METABOLIC PANEL
ALT: 8 U/L (ref 0–44)
AST: 14 U/L — ABNORMAL LOW (ref 15–41)
Albumin: 3.7 g/dL (ref 3.5–5.0)
Alkaline Phosphatase: 56 U/L (ref 38–126)
Anion gap: 8 (ref 5–15)
BUN: 16 mg/dL (ref 8–23)
CO2: 24 mmol/L (ref 22–32)
Calcium: 9 mg/dL (ref 8.9–10.3)
Chloride: 109 mmol/L (ref 98–111)
Creatinine, Ser: 1.05 mg/dL — ABNORMAL HIGH (ref 0.44–1.00)
GFR, Estimated: 49 mL/min — ABNORMAL LOW (ref >60)
Glucose, Bld: 137 mg/dL — ABNORMAL HIGH (ref 70–99)
Potassium: 4.1 mmol/L (ref 3.5–5.1)
Sodium: 141 mmol/L (ref 135–145)
Total Bilirubin: 0.7 mg/dL (ref 0.3–1.2)
Total Protein: 6.3 g/dL — ABNORMAL LOW (ref 6.5–8.1)

## 2022-05-14 LAB — TYPE AND SCREEN
ABO/RH(D): O POS
Antibody Screen: NEGATIVE

## 2022-05-14 LAB — LIPASE, BLOOD: Lipase: 54 U/L — ABNORMAL HIGH (ref 11–51)

## 2022-05-14 MED ORDER — ONDANSETRON 4 MG PO TBDP: 4.0000 mg | ORAL_TABLET | Freq: Three times a day (TID) | ORAL | 0 refills | Status: DC | PRN

## 2022-05-14 MED ORDER — ONDANSETRON 4 MG PO TBDP
4.0000 mg | ORAL_TABLET | Freq: Once | ORAL | Status: AC
  Administered 2022-05-14: 4 mg via ORAL
  Filled 2022-05-14: qty 1

## 2022-05-14 MED ORDER — ACETAMINOPHEN 500 MG PO TABS
1000.0000 mg | ORAL_TABLET | Freq: Once | ORAL | Status: AC
  Administered 2022-05-14: 1000 mg via ORAL
  Filled 2022-05-14: qty 2

## 2022-05-14 NOTE — Discharge Instructions (Addendum)
Use the Zofran medicine as needed in the mornings for any further nausea. ? ?If you develop any abdominal pain, fevers or worsening symptoms despite this, then please return to the ED. ?

## 2022-05-14 NOTE — ED Provider Notes (Signed)
? ?[facility] ?Provider Note ? ? ? Event Date/Time  ? First MD Initiated Contact with Patient 05/14/22 1752   ?  (approximate) ? ? ?History  ? ?Dizziness and Nausea ? ? ?HPI ? ?Sheila Long is a 88 y.o. female who presents to the ED for evaluation of Dizziness and Nausea ?  ?I reviewed DC summary from 4/25.  Admitted medically for acute on chronic anemia.  Required 1 unit of blood.  GI consulted and did not want to undergo any invasive procedures and so EGD/colonoscopy were deferred. ?She otherwise has a history of sarcoidosis, hypertension, hyperlipidemia, aortic regurgitation sleep apnea and chronic bronchitis. ? ?Patient presents to the ED for evaluation of nausea for the past 3 mornings.  She denies any emesis or abdominal pain, reports the nausea passes after an hour or 2.  She reports eating during the day without diarrhea, emesis, abdominal pain recurrence of the nausea.  She reports the lightheaded when she for stands up in the morning, but improved during the daytime.  No falls or syncope. ? ?She reports dark stool, that may be due to the iron supplement but she does report compliance to.  She questions if her hemoglobin is low. ? ?Physical Exam  ? ?Triage Vital Signs: ?ED Triage Vitals [05/14/22 1432]  ?Enc Vitals Group  ?   BP (!) 134/53  ?   Pulse Rate 67  ?   Resp 18  ?   Temp 98.6 ?F (37 ?C)  ?   Temp Source Oral  ?   SpO2 96 %  ?   Weight 207 lb (93.9 kg)  ?   Height 5\' 4"  (1.626 m)  ?   Head Circumference   ?   Peak Flow   ?   Pain Score 0  ?   Pain Loc   ?   Pain Edu?   ?   Excl. in GC?   ? ? ?Most recent vital signs: ?Vitals:  ? 05/14/22 1432 05/14/22 1800  ?BP: (!) 134/53 (!) 168/77  ?Pulse: 67 71  ?Resp: 18 18  ?Temp: 98.6 ?F (37 ?C)   ?SpO2: 96% 100%  ? ? ?General: Awake, no distress.  Pleasant and conversational in full sentences.  Quite sharp. ?CV:  Good peripheral perfusion.  ?Resp:  Normal effort.  ?Abd:  No distention.  Soft and benign throughout ?MSK:  No  deformity noted.  ?Neuro:  No focal deficits appreciated. Cranial nerves II through XII intact ?5/5 strength and sensation in all 4 extremities ?Other:   ? ? ?ED Results / Procedures / Treatments  ? ?Labs ?(all labs ordered are listed, but only abnormal results are displayed) ?Labs Reviewed  ?LIPASE, BLOOD - Abnormal; Notable for the following components:  ?    Result Value  ? Lipase 54 (*)   ? All other components within normal limits  ?COMPREHENSIVE METABOLIC PANEL - Abnormal; Notable for the following components:  ? Glucose, Bld 137 (*)   ? Creatinine, Ser 1.05 (*)   ? Total Protein 6.3 (*)   ? AST 14 (*)   ? GFR, Estimated 49 (*)   ? All other components within normal limits  ?CBC - Abnormal; Notable for the following components:  ? RBC 3.28 (*)   ? Hemoglobin 8.8 (*)   ? HCT 29.8 (*)   ? MCHC 29.5 (*)   ? RDW 18.6 (*)   ? All other components within normal limits  ?URINALYSIS, ROUTINE W REFLEX MICROSCOPIC - Abnormal;  Notable for the following components:  ? Color, Urine YELLOW (*)   ? APPearance CLEAR (*)   ? Bacteria, UA RARE (*)   ? All other components within normal limits  ?TYPE AND SCREEN  ? ? ?EKG ?Sinus rhythm, rate of 68 bpm.  Normal axis and intervals.  No evidence of acute ischemia. ? ?RADIOLOGY ?CT head interpreted by me without evidence of acute intracranial pathology ? ? ?Official radiology report(s): ?CT HEAD WO CONTRAST (5MM) ? ?Result Date: 05/14/2022 ?CLINICAL DATA:  Dizziness, headache EXAM: CT HEAD WITHOUT CONTRAST TECHNIQUE: Contiguous axial images were obtained from the base of the skull through the vertex without intravenous contrast. RADIATION DOSE REDUCTION: This exam was performed according to the departmental dose-optimization program which includes automated exposure control, adjustment of the mA and/or kV according to patient size and/or use of iterative reconstruction technique. COMPARISON:  CT head 06/16/2021 FINDINGS: Brain: Generalized atrophy unchanged. Negative for  hydrocephalus. Mild white matter hypodensity in the frontal lobes bilaterally unchanged. Negative for acute infarct, hemorrhage, mass Vascular: Negative for hyperdense vessel Skull: Negative.  Stable lesion in the clivus. Sinuses/Orbits: Paranasal sinuses clear. Bilateral cataract extraction Other: None IMPRESSION: Mild atrophy and mild chronic microvascular ischemic change in the white matter. No acute abnormality. Electronically Signed   By: Marlan Palauharles  Clark M.D.   On: 05/14/2022 15:11   ? ?PROCEDURES and INTERVENTIONS: ? ?.1-3 Lead EKG Interpretation ?Performed by: Delton PrairieSmith, Kaige Whistler, MD ?Authorized by: Delton PrairieSmith, Aaleeyah Bias, MD  ? ?  Interpretation: normal   ?  ECG rate:  74 ?  ECG rate assessment: normal   ?  Rhythm: sinus rhythm   ?  Ectopy: none   ?  Conduction: normal   ? ?Medications  ?acetaminophen (TYLENOL) tablet 1,000 mg (1,000 mg Oral Given 05/14/22 1927)  ?ondansetron (ZOFRAN-ODT) disintegrating tablet 4 mg (4 mg Oral Given 05/14/22 1927)  ? ? ? ?IMPRESSION / MDM / ASSESSMENT AND PLAN / ED COURSE  ?I reviewed the triage vital signs and the nursing notes. ? ?Differential diagnosis includes, but is not limited to, symptomatic anemia, pancreatitis, acute cystitis, hypokalemia ? ?{Patient presents with an acute illness or injury that is potentially life-threatening. ? ?88 year old woman who lives at home by herself presents to the ED with having felt nauseous for the past 3 mornings and concerned that her hemoglobin is low.  Her hemoglobin is up trending from her recent admission that had required a blood transfusion, no indication for repeat transfusion.  I suspect her melena is due to iron supplementation.  Metabolic panel is reassuring.  Mild elevation of lipase, could be contributing to her symptoms.  She is tolerating p.o. intake here of water and requesting discharge to get some food.  Urine without infectious features.  Doubt UTI.  I considered observation admission for this patient, but she requests going home and  family is agreeable with this.  We discussed return precautions.  Prescribe Zofran ? ?Clinical Course as of 05/14/22 2051  ?Tue May 14, 2022  ?2049 Reassessed.  Patient reports feeling okay and requesting discharge so she can get out of here and get some food to eat.  Does not want our food.  Her cousin is at the bedside and says she seems fine.  We discussed plan of care, Zofran at home and return precautions for the ED.  They expressed understanding and agreement and want to get out of here soon as possible. [DS]  ?  ?Clinical Course User Index ?[DS] Delton PrairieSmith, Irys Nigh, MD  ? ? ? ?  FINAL CLINICAL IMPRESSION(S) / ED DIAGNOSES  ? ?Final diagnoses:  ?Nausea  ? ? ? ?Rx / DC Orders  ? ?ED Discharge Orders   ? ?      Ordered  ?  ondansetron (ZOFRAN-ODT) 4 MG disintegrating tablet  Every 8 hours PRN       ? 05/14/22 2048  ? ?  ?  ? ?  ? ? ? ?Note:  This document was prepared using Dragon voice recognition software and may include unintentional dictation errors. ?  ?Delton Prairie, MD ?05/14/22 2051 ? ?

## 2022-05-14 NOTE — ED Provider Triage Note (Signed)
Emergency Medicine Provider Triage Evaluation Note ? ?Sheila Long , a 88 y.o. female  was evaluated in triage.  Pt complains of dizziness, headache started yesterday history of low hemoglobin and the blood transfusions. ? ?Review of Systems  ?Positive: See above ?Negative: Chest pain, shortness of breath ? ?Physical Exam  ?BP (!) 134/53 (BP Location: Left Arm)   Pulse 67   Temp 98.6 ?F (37 ?C) (Oral)   Resp 18   Ht 5\' 4"  (1.626 m)   Wt 93.9 kg   SpO2 96%   BMI 35.53 kg/m?  ?Gen:   Awake, no distress   ?Resp:  Normal effort ?MSK:   Moves extremities without difficulty  ?Other:   ? ?Medical Decision Making  ?Medically screening exam initiated at 2:38 PM.  Appropriate orders placed.  Alaney Witter was informed that the remainder of the evaluation will be completed by another provider, this initial triage assessment does not replace that evaluation, and the importance of remaining in the ED until their evaluation is complete. ? ? ?  ?[name], PA-C ?05/14/22 1439 ? ?

## 2022-05-14 NOTE — ED Triage Notes (Signed)
Pt here with dizziness and nausea x3 days. Pt believes that her hgb may be low, this happened 3 weeks ago. Pt denies pain.  ?

## 2022-05-17 DIAGNOSIS — H6123 Impacted cerumen, bilateral: Secondary | ICD-10-CM | POA: Diagnosis not present

## 2022-05-17 DIAGNOSIS — K219 Gastro-esophageal reflux disease without esophagitis: Secondary | ICD-10-CM | POA: Diagnosis not present

## 2022-05-17 DIAGNOSIS — H903 Sensorineural hearing loss, bilateral: Secondary | ICD-10-CM | POA: Diagnosis not present

## 2022-05-17 DIAGNOSIS — R1314 Dysphagia, pharyngoesophageal phase: Secondary | ICD-10-CM | POA: Diagnosis not present

## 2022-06-01 ENCOUNTER — Inpatient Hospital Stay
Admission: EM | Admit: 2022-06-01 | Discharge: 2022-06-06 | DRG: 291 | Disposition: A | Discharge status: Hospice | Payer: PPO | Attending: Internal Medicine | Admitting: Internal Medicine

## 2022-06-01 ENCOUNTER — Emergency Department: Payer: PPO

## 2022-06-01 ENCOUNTER — Other Ambulatory Visit: Payer: Self-pay

## 2022-06-01 DIAGNOSIS — I5031 Acute diastolic (congestive) heart failure: Secondary | ICD-10-CM | POA: Diagnosis not present

## 2022-06-01 DIAGNOSIS — J90 Pleural effusion, not elsewhere classified: Secondary | ICD-10-CM | POA: Diagnosis not present

## 2022-06-01 DIAGNOSIS — Z96651 Presence of right artificial knee joint: Secondary | ICD-10-CM | POA: Diagnosis present

## 2022-06-01 DIAGNOSIS — R069 Unspecified abnormalities of breathing: Secondary | ICD-10-CM | POA: Diagnosis not present

## 2022-06-01 DIAGNOSIS — Z515 Encounter for palliative care: Secondary | ICD-10-CM

## 2022-06-01 DIAGNOSIS — R112 Nausea with vomiting, unspecified: Secondary | ICD-10-CM | POA: Diagnosis not present

## 2022-06-01 DIAGNOSIS — R531 Weakness: Secondary | ICD-10-CM

## 2022-06-01 DIAGNOSIS — I509 Heart failure, unspecified: Secondary | ICD-10-CM

## 2022-06-01 DIAGNOSIS — R0789 Other chest pain: Secondary | ICD-10-CM | POA: Diagnosis not present

## 2022-06-01 DIAGNOSIS — J849 Interstitial pulmonary disease, unspecified: Secondary | ICD-10-CM | POA: Diagnosis not present

## 2022-06-01 DIAGNOSIS — R778 Other specified abnormalities of plasma proteins: Secondary | ICD-10-CM | POA: Diagnosis present

## 2022-06-01 DIAGNOSIS — D860 Sarcoidosis of lung: Secondary | ICD-10-CM | POA: Diagnosis present

## 2022-06-01 DIAGNOSIS — I2729 Other secondary pulmonary hypertension: Secondary | ICD-10-CM | POA: Diagnosis not present

## 2022-06-01 DIAGNOSIS — I130 Hypertensive heart and chronic kidney disease with heart failure and stage 1 through stage 4 chronic kidney disease, or unspecified chronic kidney disease: Secondary | ICD-10-CM | POA: Diagnosis not present

## 2022-06-01 DIAGNOSIS — Z96643 Presence of artificial hip joint, bilateral: Secondary | ICD-10-CM | POA: Diagnosis present

## 2022-06-01 DIAGNOSIS — I739 Peripheral vascular disease, unspecified: Secondary | ICD-10-CM | POA: Diagnosis not present

## 2022-06-01 DIAGNOSIS — I214 Non-ST elevation (NSTEMI) myocardial infarction: Secondary | ICD-10-CM

## 2022-06-01 DIAGNOSIS — I340 Nonrheumatic mitral (valve) insufficiency: Secondary | ICD-10-CM | POA: Diagnosis not present

## 2022-06-01 DIAGNOSIS — J9621 Acute and chronic respiratory failure with hypoxia: Secondary | ICD-10-CM | POA: Diagnosis not present

## 2022-06-01 DIAGNOSIS — Z66 Do not resuscitate: Secondary | ICD-10-CM | POA: Diagnosis not present

## 2022-06-01 DIAGNOSIS — J9811 Atelectasis: Secondary | ICD-10-CM | POA: Diagnosis present

## 2022-06-01 DIAGNOSIS — D869 Sarcoidosis, unspecified: Secondary | ICD-10-CM | POA: Diagnosis not present

## 2022-06-01 DIAGNOSIS — E669 Obesity, unspecified: Secondary | ICD-10-CM | POA: Diagnosis present

## 2022-06-01 DIAGNOSIS — R0689 Other abnormalities of breathing: Secondary | ICD-10-CM | POA: Diagnosis not present

## 2022-06-01 DIAGNOSIS — N1831 Chronic kidney disease, stage 3a: Secondary | ICD-10-CM | POA: Diagnosis present

## 2022-06-01 DIAGNOSIS — Z6834 Body mass index (BMI) 34.0-34.9, adult: Secondary | ICD-10-CM

## 2022-06-01 DIAGNOSIS — R0602 Shortness of breath: Secondary | ICD-10-CM | POA: Diagnosis present

## 2022-06-01 DIAGNOSIS — I10 Essential (primary) hypertension: Secondary | ICD-10-CM | POA: Diagnosis not present

## 2022-06-01 DIAGNOSIS — M170 Bilateral primary osteoarthritis of knee: Secondary | ICD-10-CM | POA: Diagnosis not present

## 2022-06-01 DIAGNOSIS — I2510 Atherosclerotic heart disease of native coronary artery without angina pectoris: Secondary | ICD-10-CM | POA: Diagnosis present

## 2022-06-01 DIAGNOSIS — Z8249 Family history of ischemic heart disease and other diseases of the circulatory system: Secondary | ICD-10-CM | POA: Diagnosis not present

## 2022-06-01 DIAGNOSIS — R0601 Orthopnea: Secondary | ICD-10-CM

## 2022-06-01 DIAGNOSIS — I700 Atherosclerosis of aorta: Secondary | ICD-10-CM | POA: Diagnosis not present

## 2022-06-01 DIAGNOSIS — K5900 Constipation, unspecified: Secondary | ICD-10-CM | POA: Diagnosis present

## 2022-06-01 DIAGNOSIS — R7989 Other specified abnormal findings of blood chemistry: Secondary | ICD-10-CM | POA: Diagnosis present

## 2022-06-01 DIAGNOSIS — G20A1 Parkinson's disease without dyskinesia, without mention of fluctuations: Secondary | ICD-10-CM | POA: Diagnosis present

## 2022-06-01 DIAGNOSIS — R062 Wheezing: Secondary | ICD-10-CM | POA: Diagnosis not present

## 2022-06-01 DIAGNOSIS — I272 Pulmonary hypertension, unspecified: Secondary | ICD-10-CM | POA: Diagnosis present

## 2022-06-01 DIAGNOSIS — G4733 Obstructive sleep apnea (adult) (pediatric): Secondary | ICD-10-CM | POA: Diagnosis not present

## 2022-06-01 DIAGNOSIS — K219 Gastro-esophageal reflux disease without esophagitis: Secondary | ICD-10-CM | POA: Diagnosis present

## 2022-06-01 DIAGNOSIS — G20 Parkinson's disease: Secondary | ICD-10-CM | POA: Diagnosis not present

## 2022-06-01 DIAGNOSIS — Z9861 Coronary angioplasty status: Secondary | ICD-10-CM

## 2022-06-01 DIAGNOSIS — H9190 Unspecified hearing loss, unspecified ear: Secondary | ICD-10-CM | POA: Diagnosis present

## 2022-06-01 DIAGNOSIS — G629 Polyneuropathy, unspecified: Secondary | ICD-10-CM | POA: Diagnosis not present

## 2022-06-01 DIAGNOSIS — R918 Other nonspecific abnormal finding of lung field: Secondary | ICD-10-CM | POA: Diagnosis not present

## 2022-06-01 DIAGNOSIS — Z79899 Other long term (current) drug therapy: Secondary | ICD-10-CM

## 2022-06-01 DIAGNOSIS — I248 Other forms of acute ischemic heart disease: Secondary | ICD-10-CM | POA: Diagnosis present

## 2022-06-01 DIAGNOSIS — I110 Hypertensive heart disease with heart failure: Secondary | ICD-10-CM | POA: Diagnosis not present

## 2022-06-01 DIAGNOSIS — R110 Nausea: Secondary | ICD-10-CM | POA: Diagnosis not present

## 2022-06-01 DIAGNOSIS — Z809 Family history of malignant neoplasm, unspecified: Secondary | ICD-10-CM | POA: Diagnosis not present

## 2022-06-01 LAB — COMPREHENSIVE METABOLIC PANEL
ALT: 7 U/L (ref 0–44)
AST: 15 U/L (ref 15–41)
Albumin: 3.6 g/dL (ref 3.5–5.0)
Alkaline Phosphatase: 59 U/L (ref 38–126)
Anion gap: 6 (ref 5–15)
BUN: 14 mg/dL (ref 8–23)
CO2: 27 mmol/L (ref 22–32)
Calcium: 8.5 mg/dL — ABNORMAL LOW (ref 8.9–10.3)
Chloride: 109 mmol/L (ref 98–111)
Creatinine, Ser: 0.95 mg/dL (ref 0.44–1.00)
GFR, Estimated: 55 mL/min — ABNORMAL LOW (ref >60)
Glucose, Bld: 135 mg/dL — ABNORMAL HIGH (ref 70–99)
Potassium: 3.7 mmol/L (ref 3.5–5.1)
Sodium: 142 mmol/L (ref 135–145)
Total Bilirubin: 0.9 mg/dL (ref 0.3–1.2)
Total Protein: 6.7 g/dL (ref 6.5–8.1)

## 2022-06-01 LAB — CBC WITH DIFFERENTIAL/PLATELET
Abs Immature Granulocytes: 0.02 10*3/uL (ref 0.00–0.07)
Basophils Absolute: 0 10*3/uL (ref 0.0–0.1)
Basophils Relative: 0 %
Eosinophils Absolute: 0.3 10*3/uL (ref 0.0–0.5)
Eosinophils Relative: 4 %
HCT: 33.9 % — ABNORMAL LOW (ref 36.0–46.0)
Hemoglobin: 10.1 g/dL — ABNORMAL LOW (ref 12.0–15.0)
Immature Granulocytes: 0 %
Lymphocytes Relative: 15 %
Lymphs Abs: 1.2 10*3/uL (ref 0.7–4.0)
MCH: 27.3 pg (ref 26.0–34.0)
MCHC: 29.8 g/dL — ABNORMAL LOW (ref 30.0–36.0)
MCV: 91.6 fL (ref 80.0–100.0)
Monocytes Absolute: 0.4 10*3/uL (ref 0.1–1.0)
Monocytes Relative: 5 %
Neutro Abs: 6.2 10*3/uL (ref 1.7–7.7)
Neutrophils Relative %: 76 %
Platelets: 267 10*3/uL (ref 150–400)
RBC: 3.7 MIL/uL — ABNORMAL LOW (ref 3.87–5.11)
RDW: 18.1 % — ABNORMAL HIGH (ref 11.5–15.5)
WBC: 8.2 10*3/uL (ref 4.0–10.5)
nRBC: 0 % (ref 0.0–0.2)

## 2022-06-01 LAB — TROPONIN I (HIGH SENSITIVITY)
Troponin I (High Sensitivity): 16 ng/L (ref <18)
Troponin I (High Sensitivity): 39 ng/L — ABNORMAL HIGH (ref <18)

## 2022-06-01 LAB — BRAIN NATRIURETIC PEPTIDE: B Natriuretic Peptide: 318.1 pg/mL — ABNORMAL HIGH (ref 0.0–100.0)

## 2022-06-01 MED ORDER — FUROSEMIDE 10 MG/ML IJ SOLN
40.0000 mg | Freq: Once | INTRAMUSCULAR | Status: AC
  Administered 2022-06-01: 40 mg via INTRAVENOUS
  Filled 2022-06-01: qty 4

## 2022-06-01 MED ORDER — GUAIFENESIN-DM 100-10 MG/5ML PO SYRP
5.0000 mL | ORAL_SOLUTION | ORAL | Status: DC | PRN
  Administered 2022-06-02 – 2022-06-05 (×9): 5 mL via ORAL
  Filled 2022-06-01 (×9): qty 10

## 2022-06-01 MED ORDER — FUROSEMIDE 10 MG/ML IJ SOLN
40.0000 mg | Freq: Every day | INTRAMUSCULAR | Status: AC
  Administered 2022-06-02: 40 mg via INTRAVENOUS
  Filled 2022-06-01: qty 4

## 2022-06-01 MED ORDER — IPRATROPIUM-ALBUTEROL 0.5-2.5 (3) MG/3ML IN SOLN
3.0000 mL | Freq: Once | RESPIRATORY_TRACT | Status: AC
Start: 2022-06-01 — End: 2022-06-01
  Administered 2022-06-01: 3 mL via RESPIRATORY_TRACT
  Filled 2022-06-01: qty 3

## 2022-06-01 MED ORDER — ONDANSETRON HCL 4 MG PO TABS
4.0000 mg | ORAL_TABLET | Freq: Four times a day (QID) | ORAL | Status: AC | PRN
  Administered 2022-06-03: 4 mg via ORAL
  Filled 2022-06-01: qty 1

## 2022-06-01 MED ORDER — NITROGLYCERIN 0.4 MG SL SUBL: 0.4000 mg | SUBLINGUAL_TABLET | SUBLINGUAL | Status: DC | PRN

## 2022-06-01 MED ORDER — ONDANSETRON HCL 4 MG/2ML IJ SOLN
4.0000 mg | Freq: Four times a day (QID) | INTRAMUSCULAR | Status: AC | PRN
  Administered 2022-06-02: 4 mg via INTRAVENOUS
  Filled 2022-06-01: qty 2

## 2022-06-01 MED ORDER — VITAMIN B-12 1000 MCG PO TABS
1000.0000 ug | ORAL_TABLET | Freq: Every day | ORAL | Status: DC
  Administered 2022-06-02 – 2022-06-06 (×5): 1000 ug via ORAL
  Filled 2022-06-01 (×5): qty 1

## 2022-06-01 MED ORDER — ACETAMINOPHEN 325 MG PO TABS: 650.0000 mg | ORAL_TABLET | Freq: Four times a day (QID) | ORAL | Status: AC | PRN

## 2022-06-01 MED ORDER — MECLIZINE HCL 25 MG PO TABS
12.5000 mg | ORAL_TABLET | Freq: Three times a day (TID) | ORAL | Status: DC | PRN
Start: 2022-06-01 — End: 2022-06-06

## 2022-06-01 MED ORDER — IPRATROPIUM-ALBUTEROL 0.5-2.5 (3) MG/3ML IN SOLN
3.0000 mL | Freq: Three times a day (TID) | RESPIRATORY_TRACT | Status: AC
  Administered 2022-06-01 – 2022-06-02 (×4): 3 mL via RESPIRATORY_TRACT
  Filled 2022-06-01 (×4): qty 3

## 2022-06-01 MED ORDER — FERROUS SULFATE 325 (65 FE) MG PO TABS
325.0000 mg | ORAL_TABLET | Freq: Two times a day (BID) | ORAL | Status: DC
  Administered 2022-06-01 – 2022-06-06 (×10): 325 mg via ORAL
  Filled 2022-06-01 (×10): qty 1

## 2022-06-01 MED ORDER — PANTOPRAZOLE SODIUM 40 MG PO TBEC
40.0000 mg | DELAYED_RELEASE_TABLET | Freq: Two times a day (BID) | ORAL | Status: DC
  Administered 2022-06-01 – 2022-06-06 (×10): 40 mg via ORAL
  Filled 2022-06-01 (×10): qty 1

## 2022-06-01 MED ORDER — ACETAMINOPHEN 650 MG RE SUPP
650.0000 mg | Freq: Four times a day (QID) | RECTAL | Status: AC | PRN
Start: 2022-06-01 — End: 2022-06-04

## 2022-06-01 MED ORDER — POLYETHYLENE GLYCOL 3350 17 G PO PACK
17.0000 g | PACK | Freq: Every day | ORAL | Status: DC | PRN
  Administered 2022-06-02: 17 g via ORAL
  Filled 2022-06-01: qty 1

## 2022-06-01 MED ORDER — ENOXAPARIN SODIUM 40 MG/0.4ML IJ SOSY
40.0000 mg | PREFILLED_SYRINGE | Freq: Every day | INTRAMUSCULAR | Status: DC
  Administered 2022-06-01 – 2022-06-05 (×5): 40 mg via SUBCUTANEOUS
  Filled 2022-06-01 (×5): qty 0.4

## 2022-06-01 NOTE — H&P (Addendum)
History and Physical   Sheila Long [id number] DOB: 11/01/1924 DOA: 06/01/2022  PCP: Gladstone Lighter, MD  Outpatient Specialists: Dr. Lanney Gins, pulmonologist Patient coming from: Home via EMS  I have personally briefly reviewed patient's old medical records in [place].  Chief Concern: [name]  HPI: [name] is a 88 year old female with history of hypertension, GERD, Parkinson's disease, sarcoidosis of lungs, who presents emergency department for chief concerns of shortness of breath.  Per EDP, patient was requiring 2 L nasal cannula via EMS however in the emergency department she is currently on room air.  Initial vitals in the emergency department showed temperature of 97.  9, respiration rate of 21, heart rate of 76, blood pressure 140/81, SPO2 of 95% on room air.  BNP was elevated at 318.  High sensitive troponin was 16.  Serum sodium 142, potassium 3.7, chloride 109, bicarb 27, BUN of 14, serum creatinine 0.95, GFR 55, nonfasting blood glucose 135, WBC 8.2, hemoglobin 10.1, platelets of 267.  UA was negative for leukocytes and nitrates.  Imaging: Portable chest x-ray in the emergency department was read as mild congestive heart failure, with increased small left pleural effusion.  ED treatment: Furosemide 40 mg IV one-time dose, DuoNebs one-time treatment.  At bedside patient is able to tell me her name, her age, and she knows she is in the hospital.  She was also able to identify her neighbor Leretha Dykes at bedside.  She reports that over the last few years she has had increasing shortness of breath.  The last week has been more short of breath than normal.  She states that shortness of breath is worse with exertion.  She endorses occasional chest pressure and tightness.  She had one episode this morning.  She does not know how long it lasts and cannot explain to me the association with the shortness of breath.  She reports it  just comes and goes occasionally.  She denies any known weight gain and/or swelling in her legs.  She does endorse that she drinks a lot of water, as much as she could.  She drinks a 16 ounce bottle of water, 3-4 bottles per day.  Fluid intake Water: 3-4 bottles of 16 ounces of water per day Coffee: 10 ounce cup daily Soda: None Ginger ale: 1 can, 2-3 times per week Tea: 1 glass, 2-3 times per week Milk: None Alcohol: None  Approximate daily fluid intake: 70 - 86 ounce per day  Social history: She lives at home by herself.  Her good friend and neighbor looks in on her occasionally.  She mainly performs her own ADLs including preparing food for herself.  She denies history of tobacco, EtOH, recreational drug use.  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  ROS: Constitutional: no weight change, no fever ENT/Mouth: no sore throat, no rhinorrhea Eyes: no eye pain, no vision changes Cardiovascular: no chest pain, + dyspnea,  no edema, no palpitations Respiratory: no cough, no sputum, no wheezing Gastrointestinal: no nausea, no vomiting, no diarrhea, no constipation Genitourinary: no urinary incontinence, no dysuria, no hematuria Musculoskeletal: no arthralgias, no myalgias Skin: no skin lesions, no pruritus, Neuro: + weakness, no loss of consciousness, no syncope Psych: no anxiety, no depression, + decrease appetite Heme/Lymph: no bruising, no bleeding  ED Course: Discussed with emergency medicine provider, patient requiring hospitalization for chief concerns of heart failure exacerbation.    Assessment/Plan  Principal  Problem:   Acute exacerbation of CHF (congestive heart failure) (HCC) Active Problems:   Parkinson disease (HCC)   GERD (gastroesophageal reflux disease)   Weakness   Sarcoidosis   Osteoarthritis of both knees   Pulmonary hypertension associated with sarcoidosis (HCC)   NSTEMI (non-ST elevated myocardial  infarction) (HCC)   Stage 3a chronic kidney disease (CKD) (HCC)   Atypical chest pain   Assessment and Plan:  * Acute exacerbation of CHF (congestive heart failure) (HCC) - Strict I's and O's - Status post furosemide 40 mg IV per EDP - I have ordered scheduled furosemide 40 mg IV for 06/10/2022, one-time dose - Complete echo ordered - Admit to telemetry cardiac, observation  GERD (gastroesophageal reflux disease) - PPI  Weakness - Fall precautions  Atypical chest pain - Etiology work-up - Nitroglycerin 0.4 mg sublingual Q 5 minutes as needed for chest pain ordered - Complete echo ordered  Stage 3a chronic kidney disease (CKD) (HCC) - At baseline  NSTEMI (non-ST elevated myocardial infarction) ([place]) - Query secondary to heart failure exacerbation in setting of volume overload - Complete echo ordered  Chart reviewed.   DVT prophylaxis: Enoxaparin Code Status: Full code Diet: Heart healthy Family Communication: Updated neighbor at bedside with patient's permission Disposition Plan: Pending clinical course and complete echo results Consults called: None at this time, patient may need cardiology evaluation pending complete echo Admission status: Telemetry cardiac, observation  Past Medical History:  Diagnosis Date   Allergic rhinitis due to pollen    Anxiety    Aortic atherosclerosis ([place])    CHF (congestive heart failure) ([place])    Coronary artery disease    Stent in 2000's ([place])   Dyspnea    GERD (gastroesophageal reflux disease)    Hard of hearing    Hypertension    Neuropathy    Obstructive sleep apnea    Osteoarthritis of both knees    Peripheral vascular disease ([place])    Sarcoidosis    Thyroid nodule    Past Surgical History:  Procedure Laterality Date   ABDOMINAL HYSTERECTOMY     BIOPSY THYROID  2014   Dr Pryor Ochoa   BREAST EXCISIONAL BIOPSY Left 1980s   surgical bx neg   BREAST EXCISIONAL BIOPSY Left 1980s   benign   CARDIOVASCULAR STRESS TEST   05/12/2012   with dobutamine---negative. Normal LV function   CORONARY ANGIOPLASTY  2006   ESOPHAGOGASTRODUODENOSCOPY (EGD) WITH PROPOFOL N/A 04/03/2020   Procedure: ESOPHAGOGASTRODUODENOSCOPY (EGD) WITH PROPOFOL;  Surgeon: Jonathon Bellows, MD;  Location: [facility] ENDOSCOPY;  Service: Gastroenterology;  Laterality: N/A;   JOINT REPLACEMENT Left 2000   hip   JOINT REPLACEMENT Right 2007   hip   KNEE ARTHROPLASTY Right 06/23/2017   Procedure: COMPUTER ASSISTED TOTAL KNEE ARTHROPLASTY;  Surgeon: Dereck Leep, MD;  Location: [facility] ORS;  Service: Orthopedics;  Laterality: Right;   Social History:  reports that she has never smoked. She has never used smokeless tobacco. She reports that she does not drink alcohol and does not use drugs.  Allergies  Allergen Reactions   Tramadol Other (See Comments)    CONFUSION   Sulfa Antibiotics     Patient does not recall   Zocor [Simvastatin] Other (See Comments)    INTOLERANCE-MYALGIAS    Family History  Problem Relation Age of Onset   Cancer Sister 19       unknown - groin area   Heart attack Brother    Cancer Sister 44       pancreatic  Heart attack Sister    Heart attack Brother    Family history: Family history reviewed and not pertinent.  Prior to Admission medications   Medication Sig Start Date End Date Taking? Authorizing Provider  albuterol (VENTOLIN HFA) 108 (90 Base) MCG/ACT inhaler Inhale 2 puffs into the lungs every 6 (six) hours as needed for wheezing or shortness of breath. 11/20/20   Loletha Grayer, MD  carbidopa-levodopa (SINEMET IR) 25-100 MG tablet Take 1 tablet by mouth at bedtime. Patient not taking: Reported on 04/21/2022 04/19/22   Theresia Lo, NP  cetirizine (ZYRTEC) 10 MG tablet Take 1 tablet (10 mg total) by mouth daily. 06/16/21   Cuthriell, Charline Bills, PA-C  ferrous sulfate 325 (65 FE) MG EC tablet Take 1 tablet (325 mg total) by mouth 2 (two) times daily. 04/23/22 04/23/23  Lorella Nimrod, MD  fluticasone (FLONASE) 50  MCG/ACT nasal spray Place 2 sprays into both nostrils daily. Patient not taking: Reported on 03/20/2021 11/20/20   Loletha Grayer, MD  furosemide (LASIX) 20 MG tablet Take 1 tablet (20 mg total) by mouth daily. 11/20/20   Wieting, Richard, MD  latanoprost (XALATAN) 0.005 % ophthalmic solution Place 1 drop into both eyes at bedtime. 05/30/17   [provider]  meclizine (ANTIVERT) 12.5 MG tablet Take 1 tablet (12.5 mg total) by mouth 3 (three) times daily as needed for dizziness. 06/16/21   Cuthriell, Charline Bills, PA-C  metoprolol succinate (TOPROL-XL) 100 MG 24 hr tablet TAKE 1 TABLET BY MOUTH EVERY DAY 04/26/21   Cletis Athens, MD  nitroGLYCERIN (NITROSTAT) 0.4 MG SL tablet Place 1 tablet (0.4 mg total) under the tongue every 5 (five) minutes as needed for chest pain. Patient not taking: Reported on 01/09/2021 11/20/20   Loletha Grayer, MD  olmesartan-hydrochlorothiazide (BENICAR HCT) 20-12.5 MG tablet Take 1 tablet by mouth daily. 11/19/21   Cletis Athens, MD  ondansetron (ZOFRAN-ODT) 4 MG disintegrating tablet Take 1 tablet (4 mg total) by mouth every 8 (eight) hours as needed. 05/14/22   Vladimir Crofts, MD  pantoprazole (PROTONIX) 40 MG tablet Take 1 tablet (40 mg total) by mouth 2 (two) times daily. 04/23/22   Lorella Nimrod, MD  polyethylene glycol (MIRALAX / GLYCOLAX) 17 g packet Take 17 g by mouth daily as needed for severe constipation. Patient not taking: Reported on 12/26/2020 11/20/20   Loletha Grayer, MD  vitamin B-12 1000 MCG tablet Take 1 tablet (1,000 mcg total) by mouth daily. 04/23/22   Lorella Nimrod, MD  Vitamin D, Ergocalciferol, (DRISDOL) 1.25 MG (50000 UNIT) CAPS capsule Take 50,000 Units by mouth once a week. 12/20/21   [provider]   Physical Exam: Vitals:   06/01/22 1108 06/01/22 1109 06/01/22 1200 06/01/22 1400  BP:  (!) 169/92 140/81 (!) 160/75  Pulse: 74  76 70  Resp: (!) 30  (!) 21 20  Temp:  97.9 F (36.6 C)    TempSrc:  Oral    SpO2: 97%  95% 100%   Weight:       Constitutional: appears younger than chronological age, frail, NAD, calm, comfortable Eyes: PERRL, lids and conjunctivae normal ENMT: Mucous membranes are moist. Posterior pharynx clear of any exudate or lesions. Age-appropriate dentition. Hearing appropriate Neck: normal, supple, no masses, no thyromegaly Respiratory: clear to auscultation bilaterally, no wheezing, no crackles. Normal respiratory effort. No accessory muscle use.  Cardiovascular: Regular rate and rhythm, no murmurs / rubs / gallops. No extremity edema. 2+ pedal pulses. No carotid bruits.  Abdomen: Obese abdomen, with multiple, appears  chronic and well-healed.  No tenderness, no masses palpated, no hepatosplenomegaly. Bowel sounds positive.  Musculoskeletal: no clubbing / cyanosis. No joint deformity upper and lower extremities. Good ROM, no contractures, no atrophy. Normal muscle tone.  Skin: no rashes, lesions, ulcers. No induration Neurologic: Sensation intact. Strength 5/5 in all 4.  Psychiatric: Normal judgment and insight. Alert and oriented x 3. Normal mood.   EKG: independently reviewed, showing sinus rhythm with rate of 74, QTc 445  Chest x-ray on Admission: I personally reviewed and I agree with radiologist reading as below.  DG Chest Portable 1 View  Result Date: 06/01/2022 CLINICAL DATA:  [name]. Wheezing. Nausea. Congestive heart failure. EXAM: PORTABLE CHEST 1 VIEW COMPARISON:  04/21/2022 FINDINGS: Stable mild cardiomegaly. Aortic atherosclerotic calcification incidentally noted. Mild interstitial infiltrates, most likely mild interstitial edema. Increased size of small left pleural effusion seen. IMPRESSION: Mild congestive heart failure, with increased small left pleural effusion. Electronically Signed   By: Marlaine Hind M.D.   On: 06/01/2022 11:29    Labs on Admission: I have personally reviewed following labs  CBC: Recent Labs  Lab 06/01/22 1105  WBC 8.2  NEUTROABS 6.2  HGB  10.1*  HCT 33.9*  MCV 91.6  PLT 99991111   Basic Metabolic Panel: Recent Labs  Lab 06/01/22 1105  NA 142  K 3.7  CL 109  CO2 27  GLUCOSE 135*  BUN 14  CREATININE 0.95  CALCIUM 8.5*   GFR: Estimated Creatinine Clearance: 37.9 mL/min (by C-G formula based on SCr of 0.95 mg/dL).  Liver Function Tests: Recent Labs  Lab 06/01/22 1105  AST 15  ALT 7  ALKPHOS 59  BILITOT 0.9  PROT 6.7  ALBUMIN 3.6   Urine analysis:    Component Value Date/Time   COLORURINE YELLOW (A) 05/14/2022 1941   APPEARANCEUR CLEAR (A) 05/14/2022 1941   LABSPEC 1.011 05/14/2022 1941   PHURINE 6.0 05/14/2022 1941   GLUCOSEU NEGATIVE 05/14/2022 1941   HGBUR NEGATIVE 05/14/2022 1941   BILIRUBINUR NEGATIVE 05/14/2022 1941   KETONESUR NEGATIVE 05/14/2022 1941   PROTEINUR NEGATIVE 05/14/2022 1941   NITRITE NEGATIVE 05/14/2022 1941   LEUKOCYTESUR NEGATIVE 05/14/2022 1941   Dr. Tobie Poet [facility]  If 7PM-7AM, please contact overnight-coverage provider If 7AM-7PM, please contact day coverage provider [email or web address]  06/01/2022, 3:46 PM

## 2022-06-01 NOTE — Assessment & Plan Note (Deleted)
-   Etiology work-up - Nitroglycerin 0.4 mg sublingual Q 5 minutes as needed for chest pain ordered - Complete echo ordered

## 2022-06-01 NOTE — ED Provider Notes (Signed)
[facility] Provider Note    Event Date/Time   First MD Initiated Contact with Patient 06/01/22 1100     (approximate)   History   [name]   HPI  Kalaysia Demonbreun is a 88 y.o. female history of CHF presents to the ER for evaluation of shortness of breath.  Patient is very hard of hearing therefore difficult to obtain history from.  She does not seem to be endorsing any chest pain or abdominal pain.  Chief complaint is shortness of breath.  Does not wear home oxygen.  Reportedly was tachypneic as well as hypertensive with EMS.     Physical Exam   Triage Vital Signs: ED Triage Vitals  Enc Vitals Group     BP --      Pulse Rate 06/01/22 1108 74     Resp 06/01/22 1108 (!) 30     Temp --      Temp src --      SpO2 06/01/22 1108 97 %     Weight 06/01/22 1103 201 lb (91.2 kg)     Height --      Head Circumference --      Peak Flow --      Pain Score 06/01/22 1102 0     Pain Loc --      Pain Edu? --      Excl. in GC? --     Most recent vital signs: Vitals:   06/01/22 1109 06/01/22 1200  BP: (!) 169/92 140/81  Pulse:  76  Resp:  (!) 21  Temp: 97.9 F (36.6 C)   SpO2:  95%     Constitutional: Alert, protecting airway Eyes: Conjunctivae are normal.  Head: Atraumatic. Nose: No congestion/rhinnorhea. Mouth/Throat: Mucous membranes are moist.   Neck: Painless ROM.  Cardiovascular:   Good peripheral circulation. No m/g/r Respiratory: Mild tachypnea with scattered anterior expiratory wheezes diminished breath sounds bilaterally posteriorly Gastrointestinal: Soft and nontender.  Musculoskeletal:  no deformity Neurologic:  MAE spontaneously. No gross focal neurologic deficits are appreciated.  Skin:  Skin is warm, dry and intact. No rash noted. Psychiatric: Mood and affect are normal. Speech and behavior are normal.    ED Results / Procedures / Treatments   Labs (all labs ordered are listed, but only abnormal results are  displayed) Labs Reviewed  CBC WITH DIFFERENTIAL/PLATELET - Abnormal; Notable for the following components:      Result Value   RBC 3.70 (*)    Hemoglobin 10.1 (*)    HCT 33.9 (*)    MCHC 29.8 (*)    RDW 18.1 (*)    All other components within normal limits  COMPREHENSIVE METABOLIC PANEL - Abnormal; Notable for the following components:   Glucose, Bld 135 (*)    Calcium 8.5 (*)    GFR, Estimated 55 (*)    All other components within normal limits  BRAIN NATRIURETIC PEPTIDE - Abnormal; Notable for the following components:   B Natriuretic Peptide 318.1 (*)    All other components within normal limits  TROPONIN I (HIGH SENSITIVITY)     EKG  ED ECG REPORT I, [name], the attending physician, personally viewed and interpreted this ECG.   Date: 06/01/2022  EKG Time: 11:10  Rate: 75  Rhythm: sinus  Axis: normal  Intervals:normal qt  ST&T Change: no stemi, no depressions    RADIOLOGY Please see ED Course for my review and interpretation.  I personally reviewed all radiographic images ordered to evaluate  for the above acute complaints and reviewed radiology reports and findings.  These findings were personally discussed with the patient.  Please see medical record for radiology report.    PROCEDURES:  Critical Care performed: No  Procedures   MEDICATIONS ORDERED IN ED: Medications  ipratropium-albuterol (DUONEB) 0.5-2.5 (3) MG/3ML nebulizer solution 3 mL (3 mLs Nebulization Given 06/01/22 1117)  furosemide (LASIX) injection 40 mg (40 mg Intravenous Given 06/01/22 1213)     IMPRESSION / MDM / ASSESSMENT AND PLAN / ED COURSE  I reviewed the triage vital signs and the nursing notes.                              Differential diagnosis includes, but is not limited to, Asthma, copd, CHF, pna, ptx, malignancy, Pe, anemia  Patient presented to the ER for evaluation of shortness of breath as described above.  This presenting complaint could reflect a potentially  life-threatening illness therefore the patient will be placed on continuous pulse oximetry and telemetry for monitoring.  Laboratory evaluation will be sent to evaluate for the above complaints.     Clinical Course as of 06/01/22 1252  Sat Jun 01, 2022  1121 Chest x-ray on my interpretation does not show any evidence of pneumothorax or infiltrate will await formal radiology report. [PR]  1140 This x-ray more consistent with CHF exacerbation.  Will give dose of IV Lasix. [PR]  1242 Patient feeling improved after Lasix.  Given her age and she lives home alone do believe that observation in the hospital clinically indicated. [PR]  1252 Case discussed in consultation with hospitalist. [PR]    Clinical Course User Index [PR] [name], MD    Patient's presentation is most consistent with acute presentation with potential threat to life or bodily function.   FINAL CLINICAL IMPRESSION(S) / ED DIAGNOSES   Final diagnoses:  Acute on chronic congestive heart failure, unspecified heart failure type (HCC)  Orthopnea     Rx / DC Orders   ED Discharge Orders     None        Note:  This document was prepared using Dragon voice recognition software and may include unintentional dictation errors.    [name], MD 06/01/22 1252

## 2022-06-01 NOTE — ED Triage Notes (Addendum)
Pt comes ems from home with shob. Family says it's ongoing but is worse for the past week. Is on a thinner. Waking up with some nausea, shob each morning. EMS reports some rales on left side. EMS original BP was 123456 systolic. Aox4, lives alone.   Pt does have wheezing audible without stethoscope. Pt is HOH and family member is bringing hearing aids.

## 2022-06-01 NOTE — ED Notes (Signed)
Purewick applied at this time.

## 2022-06-01 NOTE — Assessment & Plan Note (Deleted)
-   Query secondary to heart failure exacerbation in setting of volume overload - Complete echo ordered

## 2022-06-01 NOTE — Assessment & Plan Note (Addendum)
At baseline 

## 2022-06-01 NOTE — Assessment & Plan Note (Deleted)
Fall precautions.  

## 2022-06-01 NOTE — Assessment & Plan Note (Deleted)
-   Strict I's and O's - Status post furosemide 40 mg IV per EDP - I have ordered scheduled furosemide 40 mg IV for 06/10/2022, one-time dose - Complete echo ordered - Admit to telemetry cardiac, observation

## 2022-06-01 NOTE — Assessment & Plan Note (Addendum)
Continue Protonix °

## 2022-06-01 NOTE — Hospital Course (Addendum)
[name] is a 88 year old female with history of hypertension, GERD, Parkinson's disease, sarcoidosis of lungs, who presents emergency department for chief concerns of shortness of breath. Found to have hypoxia requiring 2 LPM oxygen secondary to acute HFpEF. Home tomorrow pending improvement in constipation.  6/7: CT chest, pulmonary consult and hospice evaluation

## 2022-06-01 NOTE — ED Notes (Signed)
Critical Result: troponin 39  Cox, MD aware

## 2022-06-02 ENCOUNTER — Observation Stay
Admit: 2022-06-02 | Discharge: 2022-06-02 | Disposition: A | Discharge status: Home / Self Care | Payer: PPO | Attending: Internal Medicine | Admitting: Internal Medicine

## 2022-06-02 DIAGNOSIS — I248 Other forms of acute ischemic heart disease: Secondary | ICD-10-CM | POA: Diagnosis present

## 2022-06-02 DIAGNOSIS — E669 Obesity, unspecified: Secondary | ICD-10-CM | POA: Diagnosis present

## 2022-06-02 DIAGNOSIS — G629 Polyneuropathy, unspecified: Secondary | ICD-10-CM | POA: Diagnosis present

## 2022-06-02 DIAGNOSIS — I5031 Acute diastolic (congestive) heart failure: Secondary | ICD-10-CM | POA: Diagnosis present

## 2022-06-02 DIAGNOSIS — R0789 Other chest pain: Secondary | ICD-10-CM | POA: Diagnosis not present

## 2022-06-02 DIAGNOSIS — Z6834 Body mass index (BMI) 34.0-34.9, adult: Secondary | ICD-10-CM | POA: Diagnosis not present

## 2022-06-02 DIAGNOSIS — Z66 Do not resuscitate: Secondary | ICD-10-CM | POA: Diagnosis not present

## 2022-06-02 DIAGNOSIS — I2510 Atherosclerotic heart disease of native coronary artery without angina pectoris: Secondary | ICD-10-CM | POA: Diagnosis present

## 2022-06-02 DIAGNOSIS — Z8249 Family history of ischemic heart disease and other diseases of the circulatory system: Secondary | ICD-10-CM | POA: Diagnosis not present

## 2022-06-02 DIAGNOSIS — Z809 Family history of malignant neoplasm, unspecified: Secondary | ICD-10-CM | POA: Diagnosis not present

## 2022-06-02 DIAGNOSIS — R0602 Shortness of breath: Secondary | ICD-10-CM | POA: Diagnosis present

## 2022-06-02 DIAGNOSIS — G20 Parkinson's disease: Secondary | ICD-10-CM | POA: Diagnosis present

## 2022-06-02 DIAGNOSIS — D860 Sarcoidosis of lung: Secondary | ICD-10-CM | POA: Diagnosis present

## 2022-06-02 DIAGNOSIS — I509 Heart failure, unspecified: Secondary | ICD-10-CM | POA: Diagnosis not present

## 2022-06-02 DIAGNOSIS — J9811 Atelectasis: Secondary | ICD-10-CM | POA: Diagnosis present

## 2022-06-02 DIAGNOSIS — M170 Bilateral primary osteoarthritis of knee: Secondary | ICD-10-CM | POA: Diagnosis present

## 2022-06-02 DIAGNOSIS — R778 Other specified abnormalities of plasma proteins: Secondary | ICD-10-CM | POA: Diagnosis not present

## 2022-06-02 DIAGNOSIS — I739 Peripheral vascular disease, unspecified: Secondary | ICD-10-CM | POA: Diagnosis present

## 2022-06-02 DIAGNOSIS — I130 Hypertensive heart and chronic kidney disease with heart failure and stage 1 through stage 4 chronic kidney disease, or unspecified chronic kidney disease: Secondary | ICD-10-CM | POA: Diagnosis present

## 2022-06-02 DIAGNOSIS — K5900 Constipation, unspecified: Secondary | ICD-10-CM | POA: Diagnosis not present

## 2022-06-02 DIAGNOSIS — Z515 Encounter for palliative care: Secondary | ICD-10-CM | POA: Diagnosis not present

## 2022-06-02 DIAGNOSIS — I272 Pulmonary hypertension, unspecified: Secondary | ICD-10-CM | POA: Diagnosis present

## 2022-06-02 DIAGNOSIS — H9190 Unspecified hearing loss, unspecified ear: Secondary | ICD-10-CM | POA: Diagnosis present

## 2022-06-02 DIAGNOSIS — G4733 Obstructive sleep apnea (adult) (pediatric): Secondary | ICD-10-CM | POA: Diagnosis present

## 2022-06-02 DIAGNOSIS — J9621 Acute and chronic respiratory failure with hypoxia: Secondary | ICD-10-CM | POA: Diagnosis not present

## 2022-06-02 DIAGNOSIS — I340 Nonrheumatic mitral (valve) insufficiency: Secondary | ICD-10-CM | POA: Diagnosis present

## 2022-06-02 DIAGNOSIS — J849 Interstitial pulmonary disease, unspecified: Secondary | ICD-10-CM | POA: Diagnosis present

## 2022-06-02 DIAGNOSIS — N1831 Chronic kidney disease, stage 3a: Secondary | ICD-10-CM | POA: Diagnosis present

## 2022-06-02 DIAGNOSIS — K219 Gastro-esophageal reflux disease without esophagitis: Secondary | ICD-10-CM | POA: Diagnosis present

## 2022-06-02 LAB — ECHOCARDIOGRAM COMPLETE
AR max vel: 1.79 cm2
AV Area VTI: 1.95 cm2
AV Area mean vel: 1.76 cm2
AV Mean grad: 4 mmHg
AV Peak grad: 7 mmHg
Ao pk vel: 1.32 m/s
Area-P 1/2: 2.84 cm2
MV VTI: 1.41 cm2
S' Lateral: 3.02 cm
Weight: 3216 oz

## 2022-06-02 LAB — CBC
HCT: 29.5 % — ABNORMAL LOW (ref 36.0–46.0)
Hemoglobin: 9 g/dL — ABNORMAL LOW (ref 12.0–15.0)
MCH: 27 pg (ref 26.0–34.0)
MCHC: 30.5 g/dL (ref 30.0–36.0)
MCV: 88.6 fL (ref 80.0–100.0)
Platelets: 222 10*3/uL (ref 150–400)
RBC: 3.33 MIL/uL — ABNORMAL LOW (ref 3.87–5.11)
RDW: 18 % — ABNORMAL HIGH (ref 11.5–15.5)
WBC: 6.7 10*3/uL (ref 4.0–10.5)
nRBC: 0 % (ref 0.0–0.2)

## 2022-06-02 LAB — BASIC METABOLIC PANEL
Anion gap: 4 — ABNORMAL LOW (ref 5–15)
BUN: 19 mg/dL (ref 8–23)
CO2: 27 mmol/L (ref 22–32)
Calcium: 8.3 mg/dL — ABNORMAL LOW (ref 8.9–10.3)
Chloride: 111 mmol/L (ref 98–111)
Creatinine, Ser: 0.96 mg/dL (ref 0.44–1.00)
GFR, Estimated: 54 mL/min — ABNORMAL LOW (ref >60)
Glucose, Bld: 119 mg/dL — ABNORMAL HIGH (ref 70–99)
Potassium: 3.5 mmol/L (ref 3.5–5.1)
Sodium: 142 mmol/L (ref 135–145)

## 2022-06-02 LAB — TROPONIN I (HIGH SENSITIVITY): Troponin I (High Sensitivity): 15 ng/L (ref <18)

## 2022-06-02 MED ORDER — FUROSEMIDE 10 MG/ML IJ SOLN
40.0000 mg | Freq: Every day | INTRAMUSCULAR | Status: DC
  Administered 2022-06-03: 40 mg via INTRAVENOUS
  Filled 2022-06-02: qty 4

## 2022-06-02 MED ORDER — PERFLUTREN LIPID MICROSPHERE
1.0000 mL | INTRAVENOUS | Status: AC | PRN
  Administered 2022-06-02: 3 mL via INTRAVENOUS

## 2022-06-02 NOTE — Progress Notes (Signed)
  Progress Note [name] [id number] DOB: Jun 16, 1924 DOA: 06/01/2022  DOS: the patient was seen and examined on 06/02/2022  Brief hospital course: [name] is a 88 year old female with history of hypertension, GERD, Parkinson's disease, sarcoidosis of lungs, who presents emergency department for chief concerns of shortness of breath. Found to have hypoxia requiring 2 LPM oxygen secondary to acute HFpEF. Assessment and Plan: * Acute heart failure with preserved ejection fraction (HCC) Essential hypertension. Presents with numbness or shortness of breath. Found to have mild hypoxia on admission with acute CHF. Echocardiogram shows preserved EF of 50 to 55% with grade 1 diastolic dysfunction. Continue with diuresis for now.  No valvular abnormality. Blood pressure adequately controlled.  Holding home antihypertensive regimen including Toprol-XL and Benicar HCT.  Elevated troponin No evidence of non-STEMI. Demand ischemia in the setting of CHF.  Parkinson disease ([place]) On Sinemet at home.  Will resume.  Stage 3a chronic kidney disease (CKD) (HCC) - At baseline  GERD (gastroesophageal reflux disease) - PPI  Obesity (BMI 30-39.9) Body mass index is 34.5 kg/m.  Placing the pt at higher risk of poor outcomes.  Obstructive sleep apnea Continue CPAP nightly.  Pulmonary hypertension associated with sarcoidosis ([place]) Prior history.  Monitor.  Peripheral vascular disease ([place]) Not on any antiplatelet medication. Monitor.  Subjective: Continues to have shortness of breath.  No cough.  No fever no chills.  Chest pain abdominal pain.  Physical Exam: Vitals:   06/02/22 0755 06/02/22 1154 06/02/22 1346 06/02/22 1520  BP: (!) 138/59 (!) 128/50  125/69  Pulse: 76 74  87  Resp: 17 17  17   Temp: (!) [id number] F (36.4 C) 98.2 F (36.8 C)    TempSrc:      SpO2: 100% 98% 95% 100%  Weight:       General: Appear in mild distress; no visible Abnormal Neck  Mass Or lumps, Conjunctiva normal Cardiovascular: S1 and S2 Present, no Murmur, Respiratory: increased respiratory effort, Bilateral Air entry present and faint crackles, no wheezes Abdomen: Bowel Sound present, Non tender  Extremities: bilateral Pedal edema Neurology: alert and oriented to time, place, and person  Gait not checked due to patient safety concerns   Data Reviewed: I have Reviewed nursing notes, Vitals, and Lab results since pt's last encounter. Pertinent lab results CBC and BMP I have ordered test including CBC and BMP    Family Communication: None at bedside  Disposition: Status is: Inpatient Remains inpatient appropriate because: Received IV diuresis  Author: Berle Mull, MD 06/02/2022 5:10 PM  Please look on [email or web address] to find out who is on call.

## 2022-06-02 NOTE — Assessment & Plan Note (Signed)
No evidence of non-STEMI. Demand ischemia in the setting of CHF.

## 2022-06-02 NOTE — Assessment & Plan Note (Signed)
Continue CPAP nightly. °

## 2022-06-02 NOTE — Assessment & Plan Note (Addendum)
Continue Sinemet.  Poor prognosis

## 2022-06-02 NOTE — Assessment & Plan Note (Addendum)
Not on any antiplatelet medication. Monitor.

## 2022-06-02 NOTE — Assessment & Plan Note (Signed)
Prior history.  Monitor. Consult pulmonary.  CT chest as she is still symptomatic

## 2022-06-02 NOTE — Assessment & Plan Note (Signed)
Body mass index is 34.51 kg/m.  Placing the pt at higher risk of poor outcomes.

## 2022-06-02 NOTE — Assessment & Plan Note (Signed)
Essential hypertension. Presents with numbness or shortness of breath. Found to have mild hypoxia on admission with acute CHF. Echocardiogram shows preserved EF of 50 to 55% with grade 1 diastolic dysfunction. Continue with diuresis for now.  No valvular abnormality. Chest x-ray shows improvement. Patient required therapy and further assistance on early morning of 6/6.  Will monitor closely.  Continue p.o. Lasix. Resume Aldactone, resume metoprolol at a lower dose of 25 mg.  Holding Benicar HCTZ.

## 2022-06-03 ENCOUNTER — Inpatient Hospital Stay: Payer: PPO

## 2022-06-03 DIAGNOSIS — I5031 Acute diastolic (congestive) heart failure: Secondary | ICD-10-CM | POA: Diagnosis not present

## 2022-06-03 LAB — BASIC METABOLIC PANEL
Anion gap: 7 (ref 5–15)
BUN: 18 mg/dL (ref 8–23)
CO2: 26 mmol/L (ref 22–32)
Calcium: 8.6 mg/dL — ABNORMAL LOW (ref 8.9–10.3)
Chloride: 107 mmol/L (ref 98–111)
Creatinine, Ser: 1.08 mg/dL — ABNORMAL HIGH (ref 0.44–1.00)
GFR, Estimated: 47 mL/min — ABNORMAL LOW (ref >60)
Glucose, Bld: 110 mg/dL — ABNORMAL HIGH (ref 70–99)
Potassium: 3.6 mmol/L (ref 3.5–5.1)
Sodium: 140 mmol/L (ref 135–145)

## 2022-06-03 LAB — MAGNESIUM: Magnesium: 2 mg/dL (ref 1.7–2.4)

## 2022-06-03 MED ORDER — ALBUTEROL SULFATE (2.5 MG/3ML) 0.083% IN NEBU
3.0000 mL | INHALATION_SOLUTION | Freq: Four times a day (QID) | RESPIRATORY_TRACT | Status: DC
Start: 2022-06-03 — End: 2022-06-06
  Administered 2022-06-03 – 2022-06-06 (×11): 3 mL via RESPIRATORY_TRACT
  Filled 2022-06-03 (×12): qty 3

## 2022-06-03 MED ORDER — MAGNESIUM HYDROXIDE 400 MG/5ML PO SUSP
30.0000 mL | Freq: Every day | ORAL | Status: DC | PRN
Start: 2022-06-03 — End: 2022-06-06
  Administered 2022-06-03 – 2022-06-05 (×3): 30 mL via ORAL
  Filled 2022-06-03 (×2): qty 30

## 2022-06-03 MED ORDER — SENNOSIDES-DOCUSATE SODIUM 8.6-50 MG PO TABS
1.0000 | ORAL_TABLET | Freq: Two times a day (BID) | ORAL | Status: DC
  Administered 2022-06-03 – 2022-06-06 (×7): 1 via ORAL
  Filled 2022-06-03 (×7): qty 1

## 2022-06-03 MED ORDER — FUROSEMIDE 40 MG PO TABS
40.0000 mg | ORAL_TABLET | Freq: Every day | ORAL | Status: DC
  Administered 2022-06-04 – 2022-06-05 (×2): 40 mg via ORAL
  Filled 2022-06-03 (×2): qty 1

## 2022-06-03 MED ORDER — MAGNESIUM HYDROXIDE 400 MG/5ML PO SUSP
ORAL | Status: AC
  Filled 2022-06-03: qty 30

## 2022-06-03 MED ORDER — FUROSEMIDE 40 MG PO TABS
40.0000 mg | ORAL_TABLET | Freq: Every day | ORAL | Status: DC
Start: 2022-06-03 — End: 2022-06-03

## 2022-06-03 NOTE — IPAL (Signed)
  Interdisciplinary Goals of Care Family Meeting   Date carried out: 06/03/2022  Location of the meeting: Bedside  Member's involved: Physician  Durable Power of Attorney or acting medical decision maker: Sister (not present)  Discussion: We discussed goals of care for [employer] .  Patient currently listed as a full code. Explained the rationale a full code. Discussed in detail with regards to what does not entail and whether patient would like resuscitation or not.  Patient tells me that if I am gone I am gone. Patient does not want to be kept alive on life support. Based on this conversation patient will be limited code with BiPAP only. Outpatient DNR only.  Code status: Limited code with BiPAP only.  Disposition: Continue current acute care  Time spent for the meeting: 20 minutes   Lynden Oxford, MD  06/03/2022, 6:33 PM

## 2022-06-03 NOTE — Progress Notes (Signed)
Nutrition Brief Note  RD pulled to chart secondary to CHF admission.   Wt Readings from Last 15 Encounters:  06/01/22 91.2 kg  05/14/22 93.9 kg  04/21/22 93.9 kg  10/20/21 93.9 kg  06/16/21 94.3 kg  03/20/21 94.3 kg  01/09/21 96.9 kg  12/26/20 98.4 kg  11/30/20 96.4 kg  11/20/20 96.8 kg  07/08/20 95.3 kg  06/26/20 95.8 kg  04/03/20 98.9 kg  12/13/19 98.9 kg  06/23/19 99.9 kg   Pt with history of hypertension, GERD, Parkinson's disease, sarcoidosis of lungs, who presents for chief concerns of shortness of breath.  Pt admitted with CHF exacerbation.   Medications reviewed and include ferrous sulfate, lasix, and vitamin B-12.   Body mass index is 34.5 kg/m. Patient meets criteria for obesity, class I based on current BMI. Obesity is a complex, chronic medical condition that is optimally managed by a multidisciplinary care team. Weight loss is not an ideal goal for an acute inpatient hospitalization. However, if further work-up for obesity is warranted, consider outpatient referral to outpatient bariatric service and/or [facility].    Current diet order is Heart Healthy (will liberalize diet to 2 gram sodium for wider variety of meal selection), patient is consuming approximately n/a% of meals at this time. Labs and medications reviewed.   No nutrition interventions warranted at this time. If nutrition issues arise, please consult RD.   Levada Schilling, RD, LDN, CDCES Registered Dietitian II Certified Diabetes Care and Education Specialist Please refer to [facility] for RD and/or RD on-call/weekend/after hours pager

## 2022-06-03 NOTE — Plan of Care (Signed)

## 2022-06-03 NOTE — Progress Notes (Signed)
   06/03/22 1126  Assess: MEWS Score  Temp 97.6 F (36.4 C)  BP (!) 147/58  MAP (mmHg) 85  Pulse Rate 80  Resp (!) 27  SpO2 100 %  O2 Device Room Air  Assess: MEWS Score  MEWS Temp 0  MEWS Systolic 0  MEWS Pulse 0  MEWS RR 2  MEWS LOC 0  MEWS Score 2  MEWS Score Color Yellow  Assess: if the MEWS score is Yellow or Red  Were vital signs taken at a resting state? No (had just ambulated to bathroom with assist)  Focused Assessment No change from prior assessment  Does the patient meet 2 or more of the SIRS criteria? No  MEWS guidelines implemented *See Row Information* No, vital signs rechecked  Assess: SIRS CRITERIA  SIRS Temperature  0  SIRS Pulse 0  SIRS Respirations  1  SIRS WBC 0  SIRS Score Sum  1

## 2022-06-03 NOTE — TOC Progression Note (Signed)
Transition of Care [facility]) - Progression Note    Patient Details  Name: Sheila Long MRN: [id number] Date of Birth: Apr 13, 1924  Transition [facility] Care [facility]) CM/SW Contact  Truddie Hidden, RN Phone Number: 06/03/2022, 1:38 PM  Clinical Narrative:    Patient was active with [facility] 03/2022. Message sent to [facility] to determine if patient is still active.         Expected Discharge Plan and Services                                                 Social Determinants of Health (SDOH) Interventions    Readmission Risk Interventions     View : No data to display.

## 2022-06-03 NOTE — Progress Notes (Signed)
  Progress Note [name] [id number] DOB: 1924/04/08 DOA: 06/01/2022  DOS: the patient was seen and examined on 06/03/2022  Brief hospital course: Ms. Tewanna Schrey is a 88 year old female with history of hypertension, GERD, Parkinson's disease, sarcoidosis of lungs, who presents emergency department for chief concerns of shortness of breath. Found to have hypoxia requiring 2 LPM oxygen secondary to acute HFpEF. Assessment and Plan: * Acute heart failure with preserved ejection fraction (HCC) Essential hypertension. Presents with numbness or shortness of breath. Found to have mild hypoxia on admission with acute CHF. Echocardiogram shows preserved EF of 50 to 55% with grade 1 diastolic dysfunction. Continue with diuresis for now.  No valvular abnormality. Blood pressure adequately controlled.  Holding home antihypertensive regimen including Toprol-XL and Benicar HCT. Switching IV Lasix to p.o. Lasix.  Chest x-ray shows significant improvement.  Elevated troponin No evidence of non-STEMI. Demand ischemia in the setting of CHF.  Parkinson disease ([place]) On Sinemet at home.  Will resume.  Stage 3a chronic kidney disease (CKD) (HCC) - At baseline  GERD (gastroesophageal reflux disease) - PPI  Obesity (BMI 30-39.9) Body mass index is 34.5 kg/m.  Placing the pt at higher risk of poor outcomes.  Obstructive sleep apnea Continue CPAP nightly.  Constipation Treated with aggressive bowel regimen due to patient's ongoing nausea complaint.  Pulmonary hypertension associated with sarcoidosis ([place]) Prior history.  Monitor.  Peripheral vascular disease ([place]) Not on any antiplatelet medication. Monitor.  Goals of care conversation.  Limited code only BiPAP.  Subjective: Continues to have shortness of breath continues to have cough.  Continues to have nausea.  Minimal oral intake.  No bowel movement but passing gas.  Physical Exam: Vitals:   06/03/22 1126  06/03/22 1227 06/03/22 1405 06/03/22 1613  BP: (!) 147/58   130/83  Pulse: 80   89  Resp: (!) 27 18  14   Temp: 97.6 F (36.4 C)   97.9 F (36.6 C)  TempSrc: Oral     SpO2: 100%  95% 100%  Weight:       General: Appear in mild distress; no visible Abnormal Neck Mass Or lumps, Conjunctiva normal Cardiovascular: S1 and S2 Present, no Murmur, Respiratory: increased respiratory effort, Bilateral Air entry present and faint crackles, no wheezes Abdomen: Bowel Sound present, no Extremities: trace Pedal edema Neurology: alert and oriented to time, place, and person  Gait not checked due to patient safety concerns   Data Reviewed: I have Reviewed nursing notes, Vitals, and Lab results since pt's last encounter. Pertinent lab results CBC and BMP I have ordered test including CBC and BMP I have ordered imaging studies chest x-ray and x-ray abdomen.   Family Communication: None at bedside  Disposition: Status is: Inpatient Remains inpatient appropriate because: Having frequent nausea with minimal oral intake and receiving diuretic therapy.  Author: Berle Mull, MD 06/03/2022 6:31 PM  Please look on [email or web address] to find out who is on call.

## 2022-06-03 NOTE — Assessment & Plan Note (Signed)
Treated with aggressive bowel regimen due to patient's ongoing nausea complaint.

## 2022-06-03 NOTE — Consult Note (Signed)
   Heart Failure Nurse Navigator Note  HFpEF 50 to 55%.  Grade 1 diastolic dysfunction.  Mild mitral regurgitation.  She presented to the emergency room with complaints of worsening shortness of breath.  BNP 318.  Chest x-ray revealed mild congestive heart failure with increased small left pleural effusion.  Comorbidities:  Hypertension GERD Parkinson's disease Sarcoidosis  of the lungs   Medications:  Lasix 40 mg by mouth daily   Labs:  Sodium 140, potassium 3.6, chloride 107, CO2 26, BUN 18, creatinine 1.08, GFR 47, magnesium 2.0 Weight is 91.2 kg Blood pressure 147/58 Intake 240 mL Output 1200 mL  Initial meeting with patient, she was lying in bed in no acute distress.  She states that she lives by herself but has a neighbor that checks on her frequently and also brings her her evening meal.  She does not use salt, and she did not feel that her neighbor cooked with salt.  Discussed foods to avoid.  Discussed her fluid restriction and recommended no more than 64 ounces in a days time.  She states that she does drink water and will have an occasional ginger ale.  Discussed the importance of daily weights and what to report.  She states that she already weighs herself daily and also checks her blood pressure at that time.  Discussed follow up in the outpatient heart failure clinic, she asks that I move the appointment from June 9 to the 26 at 2:30 PM as appointments after 2 work better for her ride.  She has no further questions.  Was given the living with heart failure teaching booklet, zone magnet, info on low sodium and heart failure.  Pricilla Riffle RN CHFN

## 2022-06-04 DIAGNOSIS — I5031 Acute diastolic (congestive) heart failure: Secondary | ICD-10-CM | POA: Diagnosis not present

## 2022-06-04 LAB — CBC WITH DIFFERENTIAL/PLATELET
Abs Immature Granulocytes: 0.02 10*3/uL (ref 0.00–0.07)
Basophils Absolute: 0 10*3/uL (ref 0.0–0.1)
Basophils Relative: 0 %
Eosinophils Absolute: 0.4 10*3/uL (ref 0.0–0.5)
Eosinophils Relative: 5 %
HCT: 31.4 % — ABNORMAL LOW (ref 36.0–46.0)
Hemoglobin: 9.3 g/dL — ABNORMAL LOW (ref 12.0–15.0)
Immature Granulocytes: 0 %
Lymphocytes Relative: 24 %
Lymphs Abs: 1.9 10*3/uL (ref 0.7–4.0)
MCH: 26.8 pg (ref 26.0–34.0)
MCHC: 29.6 g/dL — ABNORMAL LOW (ref 30.0–36.0)
MCV: 90.5 fL (ref 80.0–100.0)
Monocytes Absolute: 0.5 10*3/uL (ref 0.1–1.0)
Monocytes Relative: 6 %
Neutro Abs: 5.1 10*3/uL (ref 1.7–7.7)
Neutrophils Relative %: 65 %
Platelets: 267 10*3/uL (ref 150–400)
RBC: 3.47 MIL/uL — ABNORMAL LOW (ref 3.87–5.11)
RDW: 18.6 % — ABNORMAL HIGH (ref 11.5–15.5)
WBC: 8 10*3/uL (ref 4.0–10.5)
nRBC: 0 % (ref 0.0–0.2)

## 2022-06-04 LAB — BASIC METABOLIC PANEL
Anion gap: 9 (ref 5–15)
BUN: 14 mg/dL (ref 8–23)
CO2: 25 mmol/L (ref 22–32)
Calcium: 9 mg/dL (ref 8.9–10.3)
Chloride: 104 mmol/L (ref 98–111)
Creatinine, Ser: 1.12 mg/dL — ABNORMAL HIGH (ref 0.44–1.00)
GFR, Estimated: 45 mL/min — ABNORMAL LOW (ref >60)
Glucose, Bld: 183 mg/dL — ABNORMAL HIGH (ref 70–99)
Potassium: 3.8 mmol/L (ref 3.5–5.1)
Sodium: 138 mmol/L (ref 135–145)

## 2022-06-04 MED ORDER — CARBIDOPA-LEVODOPA 25-100 MG PO TABS
1.0000 | ORAL_TABLET | Freq: Every day | ORAL | Status: DC
  Administered 2022-06-04 – 2022-06-05 (×2): 1 via ORAL
  Filled 2022-06-04 (×2): qty 1

## 2022-06-04 MED ORDER — SPIRONOLACTONE 25 MG PO TABS
25.0000 mg | ORAL_TABLET | Freq: Every day | ORAL | Status: DC
  Administered 2022-06-04 – 2022-06-06 (×3): 25 mg via ORAL
  Filled 2022-06-04 (×3): qty 1

## 2022-06-04 MED ORDER — BISACODYL 10 MG RE SUPP
10.0000 mg | Freq: Once | RECTAL | Status: AC
  Administered 2022-06-04: 10 mg via RECTAL
  Filled 2022-06-04: qty 1

## 2022-06-04 MED ORDER — BENZONATATE 100 MG PO CAPS
100.0000 mg | ORAL_CAPSULE | Freq: Three times a day (TID) | ORAL | Status: DC
  Administered 2022-06-04 – 2022-06-06 (×7): 100 mg via ORAL
  Filled 2022-06-04 (×7): qty 1

## 2022-06-04 MED ORDER — FLEET ENEMA 7-19 GM/118ML RE ENEM
1.0000 | ENEMA | Freq: Once | RECTAL | Status: AC
Start: 2022-06-04 — End: 2022-06-04
  Administered 2022-06-04: 1 via RECTAL

## 2022-06-04 MED ORDER — METOPROLOL SUCCINATE ER 25 MG PO TB24
25.0000 mg | ORAL_TABLET | Freq: Every day | ORAL | Status: DC
Start: 2022-06-04 — End: 2022-06-05
  Administered 2022-06-04 – 2022-06-05 (×2): 25 mg via ORAL
  Filled 2022-06-04 (×2): qty 1

## 2022-06-04 NOTE — Evaluation (Signed)
Physical Therapy Evaluation Patient Details Name: Sheila Long MRN: [id number] DOB: 1924/03/26 Today's Date: 06/04/2022  History of Present Illness  Pt is a 66 to female that presented to the ED for SOB. Workup sohwed mild hypoxia with acute CHF. PMH of HTN, GERD, Parkinsons disease, sarcoidosis of lungs, PVD.   Clinical Impression  Patient A&Ox4, reported some chronic stiffness (back, legs) but no true pain. The patient reported at baseline she lives alone but has very helpful neighbors (they grocery shop, cook lunch and dinner for her, drive her to appts, etc). Pt is ambulatory with varying DME (pt stated it depends on the day) and modI for ADLs.  The patient was up in the bathroom with CNA at start of session. She was able to transfer from standard commode with grab bar, supervision and perform pericare. She ambulated ~45ft (pt declined leaving her room at this time)  with RW and supervision. Slow and cautious gait but no LOB noted, some mild SOB but spO2 96% on room air.  Pt up in chair with all needs in reach at end of session.  Overall the patient demonstrated deficits (see "PT Problem List") that impede the patient's functional abilities, safety, and mobility and would benefit from skilled PT intervention. Recommendation at this time is HHPT with intermittent supervision/assistance, pt also informed PT of her choice to hire an aide for morning visits once she is discharged, as well as a desire for a shower chair.      Recommendations for follow up therapy are one component of a multi-disciplinary discharge planning process, led by the attending physician.  Recommendations may be updated based on patient status, additional functional criteria and insurance authorization.  Follow Up Recommendations Home health PT    Assistance Recommended at Discharge Intermittent Supervision/Assistance  Patient can return home with the following  Assistance with cooking/housework;Assist for  transportation;Help with stairs or ramp for entrance    Equipment Recommendations None recommended by PT (shower chair)  Recommendations for Other Services       Functional Status Assessment Patient has had a recent decline in their functional status and demonstrates the ability to make significant improvements in function in a reasonable and predictable amount of time.     Precautions / Restrictions Precautions Precautions: Fall Restrictions Weight Bearing Restrictions: No      Mobility  Bed Mobility               General bed mobility comments: pt up in bathroom, and then in recliner at end of session    Transfers Overall transfer level: Needs assistance Equipment used: Rolling walker (2 wheels) Transfers: Sit to/from Stand Sit to Stand: Supervision           General transfer comment: from standard commode, use of grab bar    Ambulation/Gait Ambulation/Gait assistance: Supervision Gait Distance (Feet): 45 Feet Assistive device: Rolling walker (2 wheels)   Gait velocity: decreased     General Gait Details: slow and cautious but no LOB noted, some mild SOB but spO2 96% on room air.  Stairs            Wheelchair Mobility    Modified Rankin (Stroke Patients Only)       Balance Overall balance assessment: Needs assistance Sitting-balance support: Feet supported Sitting balance-Leahy Scale: Good     Standing balance support: Single extremity supported Standing balance-Leahy Scale: Fair Standing balance comment: able to perform pericare  Pertinent Vitals/Pain Pain Assessment Pain Assessment: No/denies pain    Home Living Family/patient expects to be discharged to:: Private residence Living Arrangements: Alone Available Help at Discharge: Neighbor;Available PRN/intermittently Type of Home: House Home Access: Stairs to enter Entrance Stairs-Rails: Right Entrance Stairs-Number of Steps: 2   Home  Layout: Multi-level;Laundry or work area in basement;Able to live on main level with bedroom/bathroom Home Equipment: Agricultural consultant (2 wheels);Cane - single point;Wheelchair - manual;BSC/3in1;Grab bars - toilet;Grab bars - tub/shower      Prior Function Prior Level of Function : Independent/Modified Independent             Mobility Comments: Pt reported she'll use whatever DME she needs for the day. ADLs Comments: modI for ADLs, neighbors assist with cooking/cleaning/groceries/appts as needed. pt stated she prepares her own breakfast     Hand Dominance        Extremity/Trunk Assessment   Upper Extremity Assessment Upper Extremity Assessment: Overall WFL for tasks assessed    Lower Extremity Assessment Lower Extremity Assessment: Generalized weakness    Cervical / Trunk Assessment Cervical / Trunk Assessment: Kyphotic  Communication   Communication: HOH  Cognition Arousal/Alertness: Awake/alert Behavior During Therapy: WFL for tasks assessed/performed Overall Cognitive Status: Within Functional Limits for tasks assessed                                          General Comments      Exercises     Assessment/Plan    PT Assessment Patient needs continued PT services  PT Problem List Decreased balance;Decreased activity tolerance;Decreased mobility       PT Treatment Interventions DME instruction;Therapeutic exercise;Gait training;Balance training;Stair training;Neuromuscular re-education;Functional mobility training;Therapeutic activities;Patient/family education    PT Goals (Current goals can be found in the Care Plan section)  Acute Rehab PT Goals Patient Stated Goal: to go home PT Goal Formulation: With patient Time For Goal Achievement: 06/18/22 Potential to Achieve Goals: Good    Frequency Min 2X/week     Co-evaluation               AM-PAC PT "6 Clicks" Mobility  Outcome Measure Help needed turning from your back to your  side while in a flat bed without using bedrails?: None Help needed moving from lying on your back to sitting on the side of a flat bed without using bedrails?: None Help needed moving to and from a bed to a chair (including a wheelchair)?: None Help needed standing up from a chair using your arms (e.g., wheelchair or bedside chair)?: None Help needed to walk in hospital room?: None Help needed climbing 3-5 steps with a railing? : A Little 6 Click Score: 23    End of Session Equipment Utilized During Treatment: Gait belt Activity Tolerance: Patient tolerated treatment well Patient left: in chair;with chair alarm set;with call bell/phone within reach Nurse Communication: Mobility status PT Visit Diagnosis: Other abnormalities of gait and mobility (R26.89);Difficulty in walking, not elsewhere classified (R26.2)    Time: 4259-5638 PT Time Calculation (min) (ACUTE ONLY): 18 min   Charges:   PT Evaluation $PT Eval Low Complexity: 1 Low PT Treatments $Therapeutic Activity: 8-22 mins        Olga Coaster PT, DPT 11:45 AM,06/04/22

## 2022-06-04 NOTE — Progress Notes (Signed)
  Progress Note [name] [id number] DOB: May 11, 1924 DOA: 06/01/2022  DOS: the patient was seen and examined on 06/04/2022  Brief hospital course: Ms. Delphinia Sendra is a 88 year old female with history of hypertension, GERD, Parkinson's disease, sarcoidosis of lungs, who presents emergency department for chief concerns of shortness of breath. Found to have hypoxia requiring 2 LPM oxygen secondary to acute HFpEF. Home tomorrow pending improvement in constipation. Assessment and Plan: * Acute heart failure with preserved ejection fraction (HCC) Essential hypertension. Presents with numbness or shortness of breath. Found to have mild hypoxia on admission with acute CHF. Echocardiogram shows preserved EF of 50 to 55% with grade 1 diastolic dysfunction. Continue with diuresis for now.  No valvular abnormality. Chest x-ray shows improvement. Patient required therapy and further assistance on early morning of 6/6.  Will monitor closely.  Continue p.o. Lasix. Resume Aldactone, resume metoprolol at a lower dose of 25 mg.  Holding Benicar HCTZ.   Elevated troponin No evidence of non-STEMI. Demand ischemia in the setting of CHF.  Parkinson disease ([place]) On Sinemet at home.  Will resume.  Stage 3a chronic kidney disease (CKD) (HCC) - At baseline  Constipation Treated with aggressive bowel regimen due to patient's ongoing nausea complaint.  If improves constipation and nausea can be discharged on on 6/7.  GERD (gastroesophageal reflux disease) - PPI  Obesity (BMI 30-39.9) Body mass index is 34.51 kg/m.  Placing the pt at higher risk of poor outcomes.  Obstructive sleep apnea Continue CPAP nightly.  Pulmonary hypertension associated with sarcoidosis ([place]) Prior history.  Monitor.  Peripheral vascular disease ([place]) Not on any antiplatelet medication. Monitor.  Subjective: Woke up early morning with complaints of cough and shortness of breath requiring  nebulizer therapy.  Reports nausea with constipation.  Physical Exam: Vitals:   06/04/22 1158 06/04/22 1546 06/04/22 1831 06/04/22 1953  BP: 133/81 (!) 139/58 (!) 139/58 121/79  Pulse: 77 95 95 (!) 102  Resp: (!) 21 18 18 18   Temp: 97.8 F (36.6 C) 97.8 F (36.6 C) 97.8 F (36.6 C) 97.8 F (36.6 C)  TempSrc: Oral Oral Oral   SpO2: 97% 100%  93%  Weight:   91.2 kg   Height:   5\' 4"  (1.626 m)    General: Appear in mild distress; no visible Abnormal Neck Mass Or lumps, Conjunctiva normal Cardiovascular: S1 and S2 Present, no Murmur, Respiratory: good respiratory effort, Bilateral Air entry present and faint Crackles, no wheezes Abdomen: Bowel Sound present, Non tender  Extremities: no Pedal edema Neurology: alert and oriented to time, place, and person  Gait not checked due to patient safety concerns   Data Reviewed: I have Reviewed nursing notes, Vitals, and Lab results since pt's last encounter. Pertinent lab results BMP and magnesium I have ordered test including BMP    Family Communication: None at bedside  Disposition: Status is: Inpatient Remains inpatient appropriate because: With nausea and constipation with minimal oral intake.  Hopefully can discharge on 6/7.  Author: Berle Mull, MD 06/04/2022 8:11 PM  Please look on [email or web address] to find out who is on call.

## 2022-06-04 NOTE — TOC Progression Note (Signed)
Transition of Care [facility]) - Progression Note    Patient Details  Name: Sheila Long MRN: [id number] Date of Birth: [birth year]  Transition of Care [facility]) CM/SW [place], RN Phone Number: 06/04/2022, 9:58 AM  Clinical Narrative:    Per Tommi Rumps at [place], Patient was never active on services after numerous unsuccessful attempts to reach her.         Expected Discharge Plan and Services                                                 Social Determinants of Health (SDOH) Interventions    Readmission Risk Interventions     View : No data to display.

## 2022-06-05 ENCOUNTER — Inpatient Hospital Stay: Payer: PPO

## 2022-06-05 DIAGNOSIS — R778 Other specified abnormalities of plasma proteins: Secondary | ICD-10-CM

## 2022-06-05 DIAGNOSIS — G20 Parkinson's disease: Secondary | ICD-10-CM | POA: Diagnosis not present

## 2022-06-05 DIAGNOSIS — I5031 Acute diastolic (congestive) heart failure: Secondary | ICD-10-CM | POA: Diagnosis not present

## 2022-06-05 DIAGNOSIS — R0789 Other chest pain: Secondary | ICD-10-CM | POA: Diagnosis not present

## 2022-06-05 LAB — BASIC METABOLIC PANEL
Anion gap: 5 (ref 5–15)
BUN: 17 mg/dL (ref 8–23)
CO2: 29 mmol/L (ref 22–32)
Calcium: 8.8 mg/dL — ABNORMAL LOW (ref 8.9–10.3)
Chloride: 105 mmol/L (ref 98–111)
Creatinine, Ser: 1.08 mg/dL — ABNORMAL HIGH (ref 0.44–1.00)
GFR, Estimated: 47 mL/min — ABNORMAL LOW (ref >60)
Glucose, Bld: 118 mg/dL — ABNORMAL HIGH (ref 70–99)
Potassium: 3.6 mmol/L (ref 3.5–5.1)
Sodium: 139 mmol/L (ref 135–145)

## 2022-06-05 LAB — CBC
HCT: 27.5 % — ABNORMAL LOW (ref 36.0–46.0)
Hemoglobin: 8.2 g/dL — ABNORMAL LOW (ref 12.0–15.0)
MCH: 26.7 pg (ref 26.0–34.0)
MCHC: 29.8 g/dL — ABNORMAL LOW (ref 30.0–36.0)
MCV: 89.6 fL (ref 80.0–100.0)
Platelets: 255 10*3/uL (ref 150–400)
RBC: 3.07 MIL/uL — ABNORMAL LOW (ref 3.87–5.11)
RDW: 18.7 % — ABNORMAL HIGH (ref 11.5–15.5)
WBC: 8.8 10*3/uL (ref 4.0–10.5)
nRBC: 0 % (ref 0.0–0.2)

## 2022-06-05 MED ORDER — PREDNISOLONE 5 MG PO TABS: 10.0000 mg | ORAL_TABLET | Freq: Every day | ORAL | Status: DC

## 2022-06-05 MED ORDER — METOPROLOL SUCCINATE ER 25 MG PO TB24
12.5000 mg | ORAL_TABLET | Freq: Every day | ORAL | Status: DC
  Administered 2022-06-06: 12.5 mg via ORAL
  Filled 2022-06-05: qty 1

## 2022-06-05 MED ORDER — PREDNISONE 10 MG PO TABS
10.0000 mg | ORAL_TABLET | Freq: Every day | ORAL | Status: DC
  Administered 2022-06-05 – 2022-06-06 (×2): 10 mg via ORAL
  Filled 2022-06-05 (×2): qty 1

## 2022-06-05 MED ORDER — TORSEMIDE 20 MG PO TABS
100.0000 mg | ORAL_TABLET | Freq: Every day | ORAL | Status: AC
  Administered 2022-06-05 – 2022-06-06 (×2): 100 mg via ORAL
  Filled 2022-06-05 (×2): qty 5

## 2022-06-05 NOTE — Care Management Important Message (Signed)
Important Message  Patient Details  Name: Sheila Long MRN: [id number] Date of Birth: 03-Apr-1924   Medicare Important Message Given:  Yes     Johnell Comings 06/05/2022, 10:55 AM

## 2022-06-05 NOTE — Progress Notes (Signed)
Physical Therapy Treatment Patient Details Name: Sheila Long MRN: [id number] DOB: February 07, 1924 Today's Date: 06/05/2022   History of Present Illness Pt is a 83 to female that presented to the ED for SOB. Workup sohwed mild hypoxia with acute CHF. PMH of HTN, GERD, Parkinsons disease, sarcoidosis of lungs, PVD.    PT Comments    Pt is making good progress towards goals with ability to ambulate in hallway with RW and safe technique. Slow gait speed with slight fatigue noted. Able to ambulate to bathroom to have BM. Once session ended, left in recliner. Will continue to progress as able.   Recommendations for follow up therapy are one component of a multi-disciplinary discharge planning process, led by the attending physician.  Recommendations may be updated based on patient status, additional functional criteria and insurance authorization.  Follow Up Recommendations  Home health PT     Assistance Recommended at Discharge Intermittent Supervision/Assistance  Patient can return home with the following Assistance with cooking/housework;Assist for transportation;Help with stairs or ramp for entrance   Equipment Recommendations  None recommended by PT    Recommendations for Other Services       Precautions / Restrictions Precautions Precautions: Fall Restrictions Weight Bearing Restrictions: No     Mobility  Bed Mobility Overal bed mobility: Needs Assistance Bed Mobility: Supine to Sit     Supine to sit: Min guard     General bed mobility comments: needs slight assist with hand on railing. Safe technique. Once seated, upright posture noted    Transfers Overall transfer level: Needs assistance Equipment used: Rolling walker (2 wheels) Transfers: Sit to/from Stand Sit to Stand: Supervision           General transfer comment: cues for hand placement. RW used    Ambulation/Gait Ambulation/Gait assistance: Land (Feet): 200 Feet Assistive  device: Rolling walker (2 wheels) Gait Pattern/deviations: Step-through pattern       General Gait Details: ambulated around RN station with safe technique and upright posture. Did fatigue with increased distance. RW used with reciprocal gait pattern. No LOB or SOB symptoms   Stairs             Wheelchair Mobility    Modified Rankin (Stroke Patients Only)       Balance Overall balance assessment: Needs assistance Sitting-balance support: Feet supported Sitting balance-Leahy Scale: Good     Standing balance support: Single extremity supported Standing balance-Leahy Scale: Fair                              Cognition Arousal/Alertness: Awake/alert Behavior During Therapy: WFL for tasks assessed/performed Overall Cognitive Status: Within Functional Limits for tasks assessed                                          Exercises Other Exercises Other Exercises: ambulated to bathroom with cga for transfers on/off toilet and hygiene    General Comments        Pertinent Vitals/Pain Pain Assessment Pain Assessment: No/denies pain    Home Living                          Prior Function            PT Goals (current goals can now be found in the care plan section)  Acute Rehab PT Goals Patient Stated Goal: to go home PT Goal Formulation: With patient Time For Goal Achievement: 06/18/22 Potential to Achieve Goals: Good Progress towards PT goals: Progressing toward goals    Frequency    Min 2X/week      PT Plan Current plan remains appropriate    Co-evaluation              AM-PAC PT "6 Clicks" Mobility   Outcome Measure  Help needed turning from your back to your side while in a flat bed without using bedrails?: None Help needed moving from lying on your back to sitting on the side of a flat bed without using bedrails?: A Little Help needed moving to and from a bed to a chair (including a wheelchair)?: A  Little Help needed standing up from a chair using your arms (e.g., wheelchair or bedside chair)?: A Little Help needed to walk in hospital room?: A Little Help needed climbing 3-5 steps with a railing? : A Little 6 Click Score: 19    End of Session Equipment Utilized During Treatment: Gait belt Activity Tolerance: Patient tolerated treatment well Patient left: in chair;with chair alarm set;with call bell/phone within reach Nurse Communication: Mobility status PT Visit Diagnosis: Other abnormalities of gait and mobility (R26.89);Difficulty in walking, not elsewhere classified (R26.2)     Time: 6948-5462 PT Time Calculation (min) (ACUTE ONLY): 15 min  Charges:  $Gait Training: 8-22 mins                     Elizabeth Palau, PT, DPT, GCS [phone]    [name] 06/05/2022, 3:09 PM

## 2022-06-05 NOTE — Progress Notes (Signed)
[facility]) Hospital Liaison Note   Received request from MD/Vipul Manuella Ghazi, for hospice services at home after discharge. Chart and patient information under review by [facility] physician. Hospice eligibility approved.   Spoke with sister/Algene to initiate education related to hospice philosophy, services, and team approach to care. Algene verbalized understanding of information given. Per discussion, the plan is for patient to discharge home via [facility] once cleared to [facility].    DME needs discussed and family denied DME needs at this time  Address verified and is correct in the chart.   If applicable, please send signed and completed DNR home with patient/family. Please provide prescriptions at discharge as needed to ensure ongoing symptom management.    [facility] information and contact numbers given to family & above information shared with TOC.   Please call with any questions/concerns.    Thank you for the opportunity to participate in this patient's care.   Daphene Calamity, MSW [facility] Liaison  [phone]

## 2022-06-05 NOTE — Progress Notes (Signed)
  Progress Note   [name] [id number] DOB: [birth year] DOA: 06/01/2022     3 DOS: the patient was seen and examined on 06/05/2022   Brief hospital course: Sheila Long is a 88 year old female with history of hypertension, GERD, Parkinson's disease, sarcoidosis of lungs, who presents emergency department for chief concerns of shortness of breath. Found to have hypoxia requiring 2 LPM oxygen secondary to acute HFpEF. Home tomorrow pending improvement in constipation.  6/7: CT chest, pulmonary consult and hospice evaluation   Assessment and Plan: * Acute heart failure with preserved ejection fraction ([place]) Essential hypertension. Presents with numbness or shortness of breath. Found to have mild hypoxia on admission with acute CHF. Echocardiogram shows preserved EF of 50 to 55% with grade 1 diastolic dysfunction. Continue p.o. Lasix and Aldactone, continue metoprolol at a lower dose of 25 mg.  Holding Benicar HCTZ.  Net IO Since Admission: -881 mL [06/05/22 1403]  Elevated troponin No evidence of non-STEMI. Demand ischemia in the setting of CHF  Parkinson disease ([place]) Continue Sinemet.  Poor prognosis  Stage 3a chronic kidney disease (CKD) (HCC) - At baseline.  Constipation Had multiple bowel movements yesterday with aggressive bowel regimen  GERD (gastroesophageal reflux disease) Continue Protonix  Obesity (BMI 30-39.9) Body mass index is 34.51 kg/m.  Placing the pt at higher risk of poor outcomes.  Obstructive sleep apnea Continue CPAP nightly  Pulmonary hypertension associated with sarcoidosis ([place]) Prior history.  Monitor. Consult pulmonary.  CT chest as she is still symptomatic  Peripheral vascular disease ([place]) Not on any antiplatelet medication.         Subjective: Hard of hearing.  Complains of shortness of breath and cough  Physical Exam: Vitals:   06/05/22 0540 06/05/22 0741 06/05/22 0749 06/05/22 1156  BP: (!)  121/55 (!) 135/49  (!) 128/57  Pulse: 77 74  81  Resp: 19 20    Temp: 98 F (36.7 C) 98 F (36.7 C)  98.3 F (36.8 C)  TempSrc:  Oral  Oral  SpO2: 93% 100% 98% 96%  Weight:      Height:       General: Appear in mild distress; very hard of hearing Cardiovascular: S1 and S2 Present, no Murmur, Respiratory: good respiratory effort, Bilateral Air entry present and faint Crackles, no wheezes Abdomen: Bowel Sound present, Non tender  Extremities: no Pedal edema Neurology: alert and oriented to time, place, and person    Data Reviewed:  Hemoglobin 8.2, creatinine 1.12  Family Communication: Updated patient's sister over phone  Disposition: Status is: Inpatient Remains inpatient appropriate because: Pulmonary consult.  Goals of care conversation and evaluation for hospice    Planned Discharge Destination: Home with Home Health    DVT prophylaxis-Lovenox Time spent: 35 minutes  Author: Max Sane, MD 06/05/2022 2:03 PM  For on call review [email or web address].

## 2022-06-05 NOTE — Consult Note (Signed)
PULMONOLOGY         Date: 06/05/2022,   MRN# [id number] Sheila Long 06-23-1924     AdmissionWeight: 91.2 kg                 CurrentWeight: 91.2 kg  Referring provider: Dr Manuella Ghazi   CHIEF COMPLAINT:   Acute on chronic hypoxemic respiratory failure   HISTORY OF PRESENT ILLNESS   This is a pleasant 88 year old female known to me from pulmonology clinic on outpatient she has a history of CHF, obstructive sleep apnea, interstitial lung disease with sarcoidosis, neuropathy, essential hypertension anxiety disorder coronary artery disease status post PCI GERD, thyroid nodules and peripheral vascular disease who came in with respiratory distress and responded to supplemental oxygen in the ED, she had blood work done which was essentially normal with mild elevation of BNP and x-ray was done showing mild pleural effusions and atelectatic changes.  She does have ongoing dyspnea and PCCM consultation was placed for additional evaluation and management.  She had CT chest done which I reviewed independently showing mild pulmonary edema with overlying atelectatic changes bilaterally and previous stable changes consistent with her known interstitial lung disease.   PAST MEDICAL HISTORY   Past Medical History:  Diagnosis Date   Allergic rhinitis due to pollen    Anxiety    Aortic atherosclerosis (HCC)    CHF (congestive heart failure) ([place])    Coronary artery disease    Stent in 2000's ([place])   Dyspnea    GERD (gastroesophageal reflux disease)    Hard of hearing    Hypertension    Neuropathy    Obstructive sleep apnea    Osteoarthritis of both knees    Peripheral vascular disease ([place])    Sarcoidosis    Thyroid nodule      SURGICAL HISTORY   Past Surgical History:  Procedure Laterality Date   ABDOMINAL HYSTERECTOMY     BIOPSY THYROID  2014   Dr Pryor Ochoa   BREAST EXCISIONAL BIOPSY Left 1980s   surgical bx neg   BREAST EXCISIONAL BIOPSY Left 1980s   benign    CARDIOVASCULAR STRESS TEST  05/12/2012   with dobutamine---negative. Normal LV function   CORONARY ANGIOPLASTY  2006   ESOPHAGOGASTRODUODENOSCOPY (EGD) WITH PROPOFOL N/A 04/03/2020   Procedure: ESOPHAGOGASTRODUODENOSCOPY (EGD) WITH PROPOFOL;  Surgeon: Jonathon Bellows, MD;  Location: [facility] ENDOSCOPY;  Service: Gastroenterology;  Laterality: N/A;   JOINT REPLACEMENT Left 2000   hip   JOINT REPLACEMENT Right 2007   hip   KNEE ARTHROPLASTY Right 06/23/2017   Procedure: COMPUTER ASSISTED TOTAL KNEE ARTHROPLASTY;  Surgeon: Dereck Leep, MD;  Location: [facility] ORS;  Service: Orthopedics;  Laterality: Right;     FAMILY HISTORY   Family History  Problem Relation Age of Onset   Cancer Sister 18       unknown - groin area   Heart attack Brother    Cancer Sister 46       pancreatic    Heart attack Sister    Heart attack Brother      SOCIAL HISTORY   Social History   Tobacco Use   Smoking status: Never   Smokeless tobacco: Never  Vaping Use   Vaping Use: Never used  Substance Use Topics   Alcohol use: No   Drug use: No     MEDICATIONS    Home Medication:    Current Medication:  Current Facility-Administered Medications:    albuterol (PROVENTIL) (2.5 MG/3ML) 0.083% nebulizer solution 3 mL,  3 mL, Inhalation, QID, Lavina Hamman, MD, 3 mL at 06/05/22 1632   benzonatate (TESSALON) capsule 100 mg, 100 mg, Oral, TID, Lavina Hamman, MD, 100 mg at 06/05/22 1638   carbidopa-levodopa (SINEMET IR) 25-100 MG per tablet immediate release 1 tablet, 1 tablet, Oral, QHS, Lavina Hamman, MD, 1 tablet at 06/04/22 2130   enoxaparin (LOVENOX) injection 40 mg, 40 mg, Subcutaneous, QHS, Cox, Amy N, DO, 40 mg at 06/04/22 2129   ferrous sulfate tablet 325 mg, 325 mg, Oral, BID, Cox, Amy N, DO, 325 mg at 06/05/22 0946   furosemide (LASIX) tablet 40 mg, 40 mg, Oral, Daily, Lavina Hamman, MD, 40 mg at 06/05/22 0946   guaiFENesin-dextromethorphan (ROBITUSSIN DM) 100-10 MG/5ML syrup 5 mL, 5 mL, Oral,  Q4H PRN, Cox, Amy N, DO, 5 mL at 06/05/22 0946   magnesium hydroxide (MILK OF MAGNESIA) suspension 30 mL, 30 mL, Oral, Daily PRN, Lavina Hamman, MD, 30 mL at 06/05/22 0947   meclizine (ANTIVERT) tablet 12.5 mg, 12.5 mg, Oral, TID PRN, Cox, Amy N, DO   metoprolol succinate (TOPROL-XL) 24 hr tablet 25 mg, 25 mg, Oral, Daily, Lavina Hamman, MD, 25 mg at 06/05/22 0946   nitroGLYCERIN (NITROSTAT) SL tablet 0.4 mg, 0.4 mg, Sublingual, Q5 min PRN, Cox, Amy N, DO   pantoprazole (PROTONIX) EC tablet 40 mg, 40 mg, Oral, BID, Cox, Amy N, DO, 40 mg at 06/05/22 0946   polyethylene glycol (MIRALAX / GLYCOLAX) packet 17 g, 17 g, Oral, Daily PRN, Cox, Amy N, DO, 17 g at 06/02/22 1017   senna-docusate (Senokot-S) tablet 1 tablet, 1 tablet, Oral, BID, Lavina Hamman, MD, 1 tablet at 06/05/22 [id number]   spironolactone (ALDACTONE) tablet 25 mg, 25 mg, Oral, Daily, Lavina Hamman, MD, 25 mg at 06/05/22 [id number]   vitamin B-12 (CYANOCOBALAMIN) tablet 1,000 mcg, 1,000 mcg, Oral, Daily, Cox, Amy N, DO, 1,000 mcg at 06/05/22 0946    ALLERGIES   Tramadol, Sulfa antibiotics, and Zocor [simvastatin]     REVIEW OF SYSTEMS    Review of Systems:  Gen:  Denies  fever, sweats, chills weigh loss  HEENT: Denies blurred vision, double vision, ear pain, eye pain, hearing loss, nose bleeds, sore throat Cardiac:  No dizziness, chest pain or heaviness, chest tightness,edema Resp:   reports dyspnea chronically  Gi: Denies swallowing difficulty, stomach pain, nausea or vomiting, diarrhea, constipation, bowel incontinence Gu:  Denies bladder incontinence, burning urine Ext:   Denies Joint pain, stiffness or swelling Skin: Denies  skin rash, easy bruising or bleeding or hives Endoc:  Denies polyuria, polydipsia , polyphagia or weight change Psych:   Denies depression, insomnia or hallucinations   Other:  All other systems negative   VS: BP (!) 140/59 (BP Location: Right Arm)   Pulse 77   Temp 97.8 F (36.6 C) (Oral)    Resp 18   Ht 5\' 4"  (1.626 m)   Wt 91.2 kg   SpO2 97%   BMI 34.51 kg/m      PHYSICAL EXAM    GENERAL:NAD, no fevers, chills, no weakness no fatigue HEAD: Normocephalic, atraumatic.  EYES: Pupils equal, round, reactive to light. Extraocular muscles intact. No scleral icterus.  MOUTH: Moist mucosal membrane. Dentition intact. No abscess noted.  EAR, NOSE, THROAT: Clear without exudates. No external lesions.  NECK: Supple. No thyromegaly. No nodules. No JVD.  PULMONARY: decreased breath sounds with mild rhonchi worse at bases bilaterally.  CARDIOVASCULAR: S1 and S2. Regular rate and rhythm. No  murmurs, rubs, or gallops. No edema. Pedal pulses 2+ bilaterally.  GASTROINTESTINAL: Soft, nontender, nondistended. No masses. Positive bowel sounds. No hepatosplenomegaly.  MUSCULOSKELETAL: No swelling, clubbing, or edema. Range of motion full in all extremities.  NEUROLOGIC: Cranial nerves II through XII are intact. No gross focal neurological deficits. Sensation intact. Reflexes intact.  SKIN: No ulceration, lesions, rashes, or cyanosis. Skin warm and dry. Turgor intact.  PSYCHIATRIC: Mood, affect within normal limits. The patient is awake, alert and oriented x 3. Insight, judgment intact.       IMAGING       EXAM: CT CHEST WITHOUT CONTRAST  TECHNIQUE: Multidetector CT imaging of the chest was performed following the standard protocol without IV contrast.  RADIATION DOSE REDUCTION: This exam was performed according to the departmental dose-optimization program which includes automated exposure control, adjustment of the mA and/or kV according to patient size and/or use of iterative reconstruction technique.  COMPARISON: 10/20/2021  FINDINGS: Cardiovascular: No significant vascular findings. Mild stable cardiomegaly. No pericardial effusion. Multivessel coronary artery atherosclerosis. Thoracic aortic atherosclerosis.  Mediastinum/Nodes: No enlarged mediastinal or axillary  lymph nodes. Thyroid gland, trachea, and esophagus demonstrate no significant findings.  Lungs/Pleura: Small bilateral pleural effusions. Left basilar atelectasis. Stable 3 mm right middle lobe pulmonary nodule unchanged compared with 10/21/2017. Stable 5 mm right middle lobe pulmonary nodule adjacent to the pericardium unchanged compared with 10/21/2017.  Upper Abdomen: No acute abnormality.  Musculoskeletal: No chest wall mass or suspicious bone lesions identified. Generalized osteopenia. Moderate osteoarthritis of bilateral glenohumeral joints. Multiple loose bodies in the left subcoracoid recess.  IMPRESSION: 1. Small bilateral pleural effusions with left basilar atelectasis. 2. Multivessel coronary artery atherosclerosis. 3. Aortic Atherosclerosis (ICD10-I70.0).   Electronically Signed By: Kathreen Devoid M.D. On: 06/05/2022 09:01   ASSESSMENT/PLAN   Acute on chronic hypoxemic respiratory failure Patient has known interstitial lung disease with sarcoidosis.  Currently there is also acute on chronic CHF exacerbation with pulmonary edema and atelectatic changes bilaterally.  She would benefit from diuresis and recruitment maneuvers to remedy her atelectatic segments at the bases bilaterally and posteriorly.  He is currently on Lasix 40 mg p.o. daily as well as Toprol-XL 25 mg  Acute on chronic CHF exacerbation with preserved ejection fraction  -She is currently on Aldactone and Lasix  -will upgrade Lasix to torsemide 100 mg x 2 days   Bibasilar atelectasis Continue to use incentive spirometry and flutter valve, patient may benefit from MetaNeb device  Bilateral pleural effusions -We will increase her diuresis with torsemide 100 mg x 2 days and will be holding Lasix during this time and continue her Aldactone -We will reduce Toprol-XL to 12.5 to keep blood pressure at a safe level  Pulmonary sarcoidosis stage II -Low-dose prednisone 10 mg p.o. daily while  hospitalized      Thank you for allowing me to participate in the care of this patient.   Patient/Family are satisfied with care plan and all questions have been answered.    Provider disclosure: Patient with at least one acute or chronic illness or injury that poses a threat to life or bodily function and is being managed actively during this encounter.  All of the below services have been performed independently by signing provider:  review of prior documentation from internal and or external health records.  Review of previous and current lab results.  Interview and comprehensive assessment during patient visit today. Review of current and previous chest radiographs/CT scans. Discussion of management and test interpretation with health care team  and patient/family.   This document was prepared using Dragon voice recognition software and may include unintentional dictation errors.     Ottie Glazier, M.D.  Division of Pulmonary & Critical Care Medicine

## 2022-06-05 NOTE — TOC Progression Note (Signed)
Transition of Care [facility]) - Progression Note    Patient Details  Name: Sheila Long MRN: [id number] Date of Birth: Mar 26, 1924  [facility] CM/SW Contact  Truddie Hidden, RN Phone Number: 06/05/2022, 3:08 PM  Clinical Narrative:    Patient referred to hospice. Referral accepted by Odette Fraction.         Expected Discharge Plan and Services                                                 Social Determinants of Health (SDOH) Interventions    Readmission Risk Interventions     View : No data to display.

## 2022-06-06 DIAGNOSIS — N1831 Chronic kidney disease, stage 3a: Secondary | ICD-10-CM | POA: Diagnosis not present

## 2022-06-06 DIAGNOSIS — Z515 Encounter for palliative care: Secondary | ICD-10-CM

## 2022-06-06 DIAGNOSIS — I509 Heart failure, unspecified: Secondary | ICD-10-CM | POA: Diagnosis not present

## 2022-06-06 DIAGNOSIS — I5031 Acute diastolic (congestive) heart failure: Secondary | ICD-10-CM | POA: Diagnosis not present

## 2022-06-06 LAB — CBC
HCT: 31.1 % — ABNORMAL LOW (ref 36.0–46.0)
Hemoglobin: 9.4 g/dL — ABNORMAL LOW (ref 12.0–15.0)
MCH: 26.9 pg (ref 26.0–34.0)
MCHC: 30.2 g/dL (ref 30.0–36.0)
MCV: 88.9 fL (ref 80.0–100.0)
Platelets: 267 10*3/uL (ref 150–400)
RBC: 3.5 MIL/uL — ABNORMAL LOW (ref 3.87–5.11)
RDW: 19.1 % — ABNORMAL HIGH (ref 11.5–15.5)
WBC: 6.7 10*3/uL (ref 4.0–10.5)
nRBC: 0 % (ref 0.0–0.2)

## 2022-06-06 LAB — BASIC METABOLIC PANEL
Anion gap: 8 (ref 5–15)
BUN: 15 mg/dL (ref 8–23)
CO2: 30 mmol/L (ref 22–32)
Calcium: 8.9 mg/dL (ref 8.9–10.3)
Chloride: 101 mmol/L (ref 98–111)
Creatinine, Ser: 1.22 mg/dL — ABNORMAL HIGH (ref 0.44–1.00)
GFR, Estimated: 41 mL/min — ABNORMAL LOW (ref >60)
Glucose, Bld: 156 mg/dL — ABNORMAL HIGH (ref 70–99)
Potassium: 4.2 mmol/L (ref 3.5–5.1)
Sodium: 139 mmol/L (ref 135–145)

## 2022-06-06 MED ORDER — PROCHLORPERAZINE MALEATE 10 MG PO TABS: 10.0000 mg | ORAL_TABLET | ORAL | 0 refills | Status: DC | PRN

## 2022-06-06 MED ORDER — SENNOSIDES 8.6 MG PO TABS: 2.0000 | ORAL_TABLET | Freq: Two times a day (BID) | ORAL | 0 refills | Status: DC

## 2022-06-06 MED ORDER — IPRATROPIUM-ALBUTEROL 0.5-2.5 (3) MG/3ML IN SOLN: 3.0000 mL | RESPIRATORY_TRACT | 0 refills | Status: DC | PRN

## 2022-06-06 MED ORDER — HALOPERIDOL 5 MG PO TABS: 5.0000 mg | ORAL_TABLET | ORAL | 0 refills | Status: DC | PRN

## 2022-06-06 MED ORDER — LORAZEPAM 0.5 MG PO TABS: 0.5000 mg | ORAL_TABLET | ORAL | 0 refills | Status: AC | PRN

## 2022-06-06 MED ORDER — PREDNISONE 10 MG PO TABS: 10.0000 mg | ORAL_TABLET | Freq: Every day | ORAL | 0 refills | Status: AC

## 2022-06-07 ENCOUNTER — Ambulatory Visit: Payer: PPO | Admitting: Family

## 2022-06-07 NOTE — Discharge Summary (Signed)
Physician Discharge Summary   Patient: Deidre Gregor MRN: [id number] DOB: [birth year]  Admit date:     06/01/2022  Discharge date: 06/06/2022  Discharge Physician: Max Sane   PCP: Gladstone Lighter, MD   Recommendations at discharge:   Follow-up with outpatient providers as requested  Discharge Diagnoses: Principal Problem:   Acute heart failure with preserved ejection fraction (HCC) Active Problems:   Elevated troponin   Parkinson disease ([place])   Atypical chest pain   Constipation   Stage 3a chronic kidney disease (CKD) (HCC)   GERD (gastroesophageal reflux disease)   Obstructive sleep apnea   Obesity (BMI 30-39.9)   Peripheral vascular disease (HCC)   Osteoarthritis of both knees   Pulmonary hypertension associated with sarcoidosis (HCC)   Acute on chronic congestive heart failure [facility])   Hospice care  Hospital Course: Ms. Jeovanna Austen is a 88 year old female with history of hypertension, GERD, Parkinson's disease, sarcoidosis of lungs, who presents emergency department for chief concerns of shortness of breath. Found to have hypoxia requiring 2 LPM oxygen secondary to acute HFpEF. Home tomorrow pending improvement in constipation.  6/7: CT chest, pulmonary consult and hospice evaluation  Assessment and Plan: * Acute heart failure with preserved ejection fraction ([place]) Essential hypertension. Presents with numbness or shortness of breath. Found to have mild hypoxia on admission with acute CHF. Echocardiogram shows preserved EF of 50 to 55% with grade 1 diastolic dysfunction. Diuresed while in the hospital Net IO Since Admission: -1,241 mL [06/07/22 1742]   Elevated troponin No evidence of non-STEMI. Demand ischemia in the setting of CHF  Parkinson disease ([place]) Continue Sinemet.  Poor prognosis  Stage 3a chronic kidney disease (CKD) (HCC) - At baseline.  Constipation Had multiple bowel movements while in the hospital with aggressive bowel  regimen  GERD (gastroesophageal reflux disease) Continue Protonix  Obesity (BMI 30-39.9) Body mass index is 34.51 kg/m.  Placing the pt at higher risk of poor outcomes.  Obstructive sleep apnea Continue CPAP nightly  Pulmonary hypertension associated with sarcoidosis ([place]) Continue low-dose prednisone per pulmonologist  Peripheral vascular disease ([place]) Not on any antiplatelet medication.          Consultants: Pulmonary Disposition: Hospice care Diet recommendation:  Discharge Diet Orders (From admission, onward)     Start     Ordered   06/06/22 0000  Diet - low sodium heart healthy        06/06/22 1037           Carb modified diet DISCHARGE MEDICATION: Allergies as of 06/06/2022       Reactions   Tramadol Other (See Comments)   CONFUSION   Sulfa Antibiotics    Patient does not recall   Zocor [simvastatin] Other (See Comments)   INTOLERANCE-MYALGIAS        Medication List     STOP taking these medications    albuterol 108 (90 Base) MCG/ACT inhaler Commonly known as: VENTOLIN HFA   carbidopa-levodopa 25-100 MG tablet Commonly known as: SINEMET IR   cetirizine 10 MG tablet Commonly known as: ZYRTEC   fluticasone 50 MCG/ACT nasal spray Commonly known as: FLONASE   meclizine 12.5 MG tablet Commonly known as: ANTIVERT   nitroGLYCERIN 0.4 MG SL tablet Commonly known as: NITROSTAT   polyethylene glycol 17 g packet Commonly known as: MIRALAX / GLYCOLAX   Vitamin D (Ergocalciferol) 1.25 MG (50000 UNIT) Caps capsule Commonly known as: DRISDOL       TAKE these medications    cyanocobalamin 1000 MCG tablet Take  1 tablet (1,000 mcg total) by mouth daily.   ferrous sulfate 325 (65 FE) MG EC tablet Take 1 tablet (325 mg total) by mouth 2 (two) times daily.   furosemide 40 MG tablet Commonly known as: LASIX Take 40 mg by mouth daily.   haloperidol 5 MG tablet Commonly known as: HALDOL Take 1 tablet (5 mg total) by mouth every 4 (four)  hours as needed for up to 3 days for agitation. May crush, mix with water and give sublingually if needed.   ipratropium-albuterol 0.5-2.5 (3) MG/3ML Soln Commonly known as: DUONEB Take 3 mLs by nebulization every 4 (four) hours as needed (shortness of breath.).   latanoprost 0.005 % ophthalmic solution Commonly known as: XALATAN Place 1 drop into both eyes at bedtime.   LORazepam 0.5 MG tablet Commonly known as: ATIVAN Take 1 tablet (0.5 mg total) by mouth every 4 (four) hours as needed for up to 3 days for anxiety. May crush, mix with water and give sublingually if needed.   metoprolol succinate 100 MG 24 hr tablet Commonly known as: TOPROL-XL TAKE 1 TABLET BY MOUTH EVERY DAY   olmesartan-hydrochlorothiazide 40-12.5 MG tablet Commonly known as: BENICAR HCT Take 1 tablet by mouth daily.   ondansetron 4 MG disintegrating tablet Commonly known as: ZOFRAN-ODT Take 1 tablet (4 mg total) by mouth every 8 (eight) hours as needed.   pantoprazole 40 MG tablet Commonly known as: PROTONIX Take 1 tablet (40 mg total) by mouth 2 (two) times daily.   predniSONE 10 MG tablet Commonly known as: DELTASONE Take 1 tablet (10 mg total) by mouth daily with breakfast.   prochlorperazine 10 MG tablet Commonly known as: COMPAZINE Take 1 tablet (10 mg total) by mouth every 4 (four) hours as needed for nausea or vomiting. May crush, mix with water and give sublingually.   senna 8.6 MG tablet Commonly known as: Senokot Take 2 tablets (17.2 mg total) by mouth 2 (two) times daily. May crush, mix with water and give sublingually if needed.   spironolactone 25 MG tablet Commonly known as: ALDACTONE Take 25 mg by mouth daily.        Follow-up Information     Gladstone Lighter, MD. Schedule an appointment as soon as possible for a visit in 1 week(s).   Specialty: Internal Medicine Why: [facility] Discharge F/UP Contact information: [place]  [address] [phone]         Ottie Glazier, MD. Schedule an appointment as soon as possible for a visit in 2 week(s).   Specialty: Pulmonary Disease Why: [facility] Discharge F/UP Contact information: [place] [address] [phone]                Discharge Exam: Danley Danker Weights   06/01/22 1103 06/04/22 1831  Weight: 91.2 kg 91.2 kg   General: Appear in mild distress; very hard of hearing Cardiovascular: S1 and S2 Present, no Murmur, Respiratory: good respiratory effort, Bilateral Air entry present and faint Crackles, no wheezes Abdomen: Bowel Sound present, Non tender  Extremities: no Pedal edema Neurology: alert and oriented to time, place, and person   Condition at discharge: poor  The results of significant diagnostics from this hospitalization (including imaging, microbiology, ancillary and laboratory) are listed below for reference.   Imaging Studies: CT CHEST WO CONTRAST  Result Date: 06/05/2022 CLINICAL DATA:  Productive cough. EXAM: CT CHEST WITHOUT CONTRAST TECHNIQUE: Multidetector CT imaging of the chest was performed following the standard protocol without  IV contrast. RADIATION DOSE REDUCTION: This exam was performed according to the departmental dose-optimization program which includes automated exposure control, adjustment of the mA and/or kV according to patient size and/or use of iterative reconstruction technique. COMPARISON:  10/20/2021 FINDINGS: Cardiovascular: No significant vascular findings. Mild stable cardiomegaly. No pericardial effusion. Multivessel coronary artery atherosclerosis. Thoracic aortic atherosclerosis. Mediastinum/Nodes: No enlarged mediastinal or axillary lymph nodes. Thyroid gland, trachea, and esophagus demonstrate no significant findings. Lungs/Pleura: Small bilateral pleural effusions. Left basilar atelectasis. Stable 3 mm right middle lobe pulmonary nodule unchanged compared with 10/21/2017. Stable 5 mm  right middle lobe pulmonary nodule adjacent to the pericardium unchanged compared with 10/21/2017. Upper Abdomen: No acute abnormality. Musculoskeletal: No chest wall mass or suspicious bone lesions identified. Generalized osteopenia. Moderate osteoarthritis of bilateral glenohumeral joints. Multiple loose bodies in the left subcoracoid recess. IMPRESSION: 1. Small bilateral pleural effusions with left basilar atelectasis. 2. Multivessel coronary artery atherosclerosis. 3. Aortic Atherosclerosis (ICD10-I70.0). Electronically Signed   By: Kathreen Devoid M.D.   On: 06/05/2022 09:01   DG Chest Port 1 View  Result Date: 06/03/2022 CLINICAL DATA:  Intractable nausea and vomiting. EXAM: PORTABLE CHEST 1 VIEW COMPARISON:  AP chest 06/01/2022 FINDINGS: Cardiac silhouette is again moderately enlarged. Moderate calcification is again seen within the aortic arch. There is improved aeration of the bilateral lungs with resolution of the prior left-greater-than-right basilar heterogeneous airspace opacities. Resolution of the prior small left pleural effusion. No pneumothorax is seen. Minimal dextrocurvature of the upper thoracic spine with mild multilevel degenerative disc changes. Moderate to severe bilateral glenohumeral osteoarthritis. IMPRESSION: Resolution of the prior left-greater-than-right lung airspace opacities and small left pleural effusion. No acute lung process is now seen. Electronically Signed   By: Yvonne Kendall M.D.   On: 06/03/2022 12:07   DG Abd Portable 1V  Result Date: 06/03/2022 CLINICAL DATA:  88 year old female with nausea and vomiting. EXAM: PORTABLE ABDOMEN - 1 VIEW COMPARISON:  None Available. FINDINGS: The bowel gas pattern is normal. No radio-opaque calculi or other significant radiographic abnormality are seen. Partially visualized bilateral total hip arthroplasties. IMPRESSION: Nonobstructive bowel gas pattern. Electronically Signed   By: Ruthann Cancer M.D.   On: 06/03/2022 12:06    ECHOCARDIOGRAM COMPLETE  Result Date: 06/02/2022    ECHOCARDIOGRAM REPORT   Patient Name:   ZOWIE HOFFMEYER Date of Exam: 06/02/2022 Medical Rec #:  [id number]            Height:       64.0 in Accession #:    [id number]           Weight:       201.0 lb Date of Birth:  November 29, 1924            BSA:          1.961 m Patient Age:    57 years             BP:           138/59 mmHg Patient Gender: F                    HR:           70 bpm. Exam Location:  [facility] Procedure: 2D Echo, Color Doppler, Cardiac Doppler and Intracardiac            Opacification Agent Indications:     R06.00 Dyspnea, Elevated troponin  History:         Patient has prior history of Echocardiogram examinations. CHF,  CAD and History of NSTEMI, Cardiac stent, Pulmonary HTN; Risk                  Factors:Hypertension.  Sonographer:     Rosalia Hammers Referring Phys:  [id number] AMY N COX Diagnosing Phys: Isaias Cowman MD  Sonographer Comments: Suboptimal apical window and suboptimal subcostal window. Image acquisition challenging due to patient body habitus and Image acquisition challenging due to respiratory motion. IMPRESSIONS  1. Left ventricular ejection fraction, by estimation, is 50 to 55%. The left ventricle has low normal function. The left ventricle has no regional wall motion abnormalities. Left ventricular diastolic parameters are consistent with Grade I diastolic dysfunction (impaired relaxation).  2. Right ventricular systolic function is normal. The right ventricular size is normal.  3. The mitral valve is normal in structure. Mild mitral valve regurgitation. No evidence of mitral stenosis.  4. The aortic valve is normal in structure. Aortic valve regurgitation is trivial. No aortic stenosis is present.  5. The inferior vena cava is normal in size with greater than 50% respiratory variability, suggesting right atrial pressure of 3 mmHg. FINDINGS  Left Ventricle: Left ventricular ejection fraction, by estimation, is 50  to 55%. The left ventricle has low normal function. The left ventricle has no regional wall motion abnormalities. Definity contrast agent was given IV to delineate the left ventricular endocardial borders. The left ventricular internal cavity size was normal in size. There is no left ventricular hypertrophy. Left ventricular diastolic parameters are consistent with Grade I diastolic dysfunction (impaired relaxation). Right Ventricle: The right ventricular size is normal. No increase in right ventricular wall thickness. Right ventricular systolic function is normal. Left Atrium: Left atrial size was normal in size. Right Atrium: Right atrial size was normal in size. Pericardium: There is no evidence of pericardial effusion. Mitral Valve: The mitral valve is normal in structure. Mild mitral valve regurgitation. No evidence of mitral valve stenosis. MV peak gradient, 5.7 mmHg. The mean mitral valve gradient is 2.0 mmHg. Tricuspid Valve: The tricuspid valve is normal in structure. Tricuspid valve regurgitation is mild . No evidence of tricuspid stenosis. Aortic Valve: The aortic valve is normal in structure. Aortic valve regurgitation is trivial. No aortic stenosis is present. Aortic valve mean gradient measures 4.0 mmHg. Aortic valve peak gradient measures 7.0 mmHg. Aortic valve area, by VTI measures 1.95 cm. Pulmonic Valve: The pulmonic valve was normal in structure. Pulmonic valve regurgitation is not visualized. No evidence of pulmonic stenosis. Aorta: The aortic root is normal in size and structure. Venous: The inferior vena cava is normal in size with greater than 50% respiratory variability, suggesting right atrial pressure of 3 mmHg. IAS/Shunts: No atrial level shunt detected by color flow Doppler.  LEFT VENTRICLE PLAX 2D LVIDd:         4.08 cm   Diastology LVIDs:         3.02 cm   LV e' medial:    6.86 cm/s LV PW:         1.14 cm   LV E/e' medial:  14.4 LV IVS:        0.99 cm   LV e' lateral:   9.79 cm/s LVOT  diam:     1.90 cm   LV E/e' lateral: 10.1 LV SV:         56 LV SV Index:   28 LVOT Area:     2.84 cm  RIGHT VENTRICLE RV Basal diam:  3.89 cm RV S prime:  13.60 cm/s LEFT ATRIUM             Index        RIGHT ATRIUM           Index LA diam:        2.80 cm 1.43 cm/m   RA Area:     17.50 cm LA Vol (A2C):   56.2 ml 28.67 ml/m  RA Volume:   42.40 ml  21.63 ml/m LA Vol (A4C):   65.7 ml 33.51 ml/m LA Biplane Vol: 64.3 ml 32.80 ml/m  AORTIC VALVE                    PULMONIC VALVE AV Area (Vmax):    1.79 cm     PV Vmax:          1.08 m/s AV Area (Vmean):   1.76 cm     PV Vmean:         74.900 cm/s AV Area (VTI):     1.95 cm     PV VTI:           0.215 m AV Vmax:           132.00 cm/s  PV Peak grad:     4.7 mmHg AV Vmean:          92.400 cm/s  PV Mean grad:     3.0 mmHg AV VTI:            0.286 m      PR End Diast Vel: 3.70 msec AV Peak Grad:      7.0 mmHg AV Mean Grad:      4.0 mmHg LVOT Vmax:         83.50 cm/s LVOT Vmean:        57.200 cm/s LVOT VTI:          0.197 m LVOT/AV VTI ratio: 0.69  AORTA Ao Root diam: 3.60 cm MITRAL VALVE                TRICUSPID VALVE MV Area (PHT): 2.84 cm     TR Peak grad:   36.7 mmHg MV Area VTI:   1.41 cm     TR Vmax:        303.00 cm/s MV Peak grad:  5.7 mmHg MV Mean grad:  2.0 mmHg     SHUNTS MV Vmax:       1.19 m/s     Systemic VTI:  0.20 m MV Vmean:      69.6 cm/s    Systemic Diam: 1.90 cm MV Decel Time: 267 msec MV E velocity: 98.50 cm/s MV A velocity: 109.00 cm/s MV E/A ratio:  0.90 Isaias Cowman MD Electronically signed by Isaias Cowman MD Signature Date/Time: 06/02/2022/1:57:03 PM    Final    DG Chest Portable 1 View  Result Date: 06/01/2022 CLINICAL DATA:  [name]. Wheezing. Nausea. Congestive heart failure. EXAM: PORTABLE CHEST 1 VIEW COMPARISON:  04/21/2022 FINDINGS: Stable mild cardiomegaly. Aortic atherosclerotic calcification incidentally noted. Mild interstitial infiltrates, most likely mild interstitial edema. Increased size of small  left pleural effusion seen. IMPRESSION: Mild congestive heart failure, with increased small left pleural effusion. Electronically Signed   By: Marlaine Hind M.D.   On: 06/01/2022 11:29   CT HEAD WO CONTRAST (5MM)  Result Date: 05/14/2022 CLINICAL DATA:  Dizziness, headache EXAM: CT HEAD WITHOUT CONTRAST TECHNIQUE: Contiguous axial images were obtained from the base of the skull through the vertex without intravenous  contrast. RADIATION DOSE REDUCTION: This exam was performed according to the departmental dose-optimization program which includes automated exposure control, adjustment of the mA and/or kV according to patient size and/or use of iterative reconstruction technique. COMPARISON:  CT head 06/16/2021 FINDINGS: Brain: Generalized atrophy unchanged. Negative for hydrocephalus. Mild white matter hypodensity in the frontal lobes bilaterally unchanged. Negative for acute infarct, hemorrhage, mass Vascular: Negative for hyperdense vessel Skull: Negative.  Stable lesion in the clivus. Sinuses/Orbits: Paranasal sinuses clear. Bilateral cataract extraction Other: None IMPRESSION: Mild atrophy and mild chronic microvascular ischemic change in the white matter. No acute abnormality. Electronically Signed   By: Franchot Gallo M.D.   On: 05/14/2022 15:11    Microbiology: Results for orders placed or performed during the hospital encounter of 04/21/22  Resp Panel by RT-PCR (Flu A&B, Covid) Nasopharyngeal Swab     Status: None   Collection Time: 04/21/22  6:39 PM   Specimen: Nasopharyngeal Swab; Nasopharyngeal(NP) swabs in vial transport medium  Result Value Ref Range Status   SARS Coronavirus 2 by RT PCR NEGATIVE NEGATIVE Final    Comment: (NOTE) SARS-CoV-2 target nucleic acids are NOT DETECTED.  The SARS-CoV-2 RNA is generally detectable in upper respiratory specimens during the acute phase of infection. The lowest concentration of SARS-CoV-2 viral copies this assay can detect is 138 copies/mL. A  negative result does not preclude SARS-Cov-2 infection and should not be used as the sole basis for treatment or other patient management decisions. A negative result may occur with  improper specimen collection/handling, submission of specimen other than nasopharyngeal swab, presence of viral mutation(s) within the areas targeted by this assay, and inadequate number of viral copies(<138 copies/mL). A negative result must be combined with clinical observations, patient history, and epidemiological information. The expected result is Negative.  Fact Sheet for Patients:  EntrepreneurPulse.com.au  Fact Sheet for Healthcare Providers:  IncredibleEmployment.be  This test is no t yet approved or cleared by the [place] FDA and  has been authorized for detection and/or diagnosis of SARS-CoV-2 by FDA under an Emergency Use Authorization (EUA). This EUA will remain  in effect (meaning this test can be used) for the duration of the COVID-19 declaration under Section 564(b)(1) of the Act, 21 U.S.C.section 360bbb-3(b)(1), unless the authorization is terminated  or revoked sooner.       Influenza A by PCR NEGATIVE NEGATIVE Final   Influenza B by PCR NEGATIVE NEGATIVE Final    Comment: (NOTE) The Xpert Xpress SARS-CoV-2/FLU/RSV plus assay is intended as an aid in the diagnosis of influenza from Nasopharyngeal swab specimens and should not be used as a sole basis for treatment. Nasal washings and aspirates are unacceptable for Xpert Xpress SARS-CoV-2/FLU/RSV testing.  Fact Sheet for Patients: EntrepreneurPulse.com.au  Fact Sheet for Healthcare Providers: IncredibleEmployment.be  This test is not yet approved or cleared by the [place] FDA and has been authorized for detection and/or diagnosis of SARS-CoV-2 by FDA under an Emergency Use Authorization (EUA). This EUA will remain in effect (meaning this test can  be used) for the duration of the COVID-19 declaration under Section 564(b)(1) of the Act, 21 U.S.C. section 360bbb-3(b)(1), unless the authorization is terminated or revoked.  Performed at [facility], [address] [place], [place] [address]     Labs: CBC: Recent Labs  Lab 06/01/22 1105 06/02/22 0501 06/04/22 1002 06/05/22 0310 06/06/22 0519  WBC 8.2 6.7 8.0 8.8 6.7  NEUTROABS 6.2  --  5.1  --   --   HGB 10.1*  9.0* 9.3* 8.2* 9.4*  HCT 33.9* 29.5* 31.4* 27.5* 31.1*  MCV 91.6 88.6 90.5 89.6 88.9  PLT 267 222 267 255 [id number]   Basic Metabolic Panel: Recent Labs  Lab 06/02/22 0501 06/03/22 0819 06/04/22 1002 06/05/22 0310 06/06/22 0519  NA 142 140 138 139 139  K 3.5 3.6 3.8 3.6 4.2  CL 111 107 104 105 101  CO2 27 26 25 29 30   GLUCOSE 119* 110* 183* 118* 156*  BUN 19 18 14 17 15   CREATININE 0.96 1.08* 1.12* 1.08* 1.22*  CALCIUM 8.3* 8.6* 9.0 8.8* 8.9  MG  --  2.0  --   --   --    Liver Function Tests: Recent Labs  Lab 06/01/22 1105  AST 15  ALT 7  ALKPHOS 59  BILITOT 0.9  PROT 6.7  ALBUMIN 3.6   CBG: No results for input(s): "GLUCAP" in the last 168 hours.  Discharge time spent: greater than 30 minutes.  Signed: Max Sane, MD [facility] 06/07/2022

## 2022-06-12 DIAGNOSIS — M4807 Spinal stenosis, lumbosacral region: Secondary | ICD-10-CM | POA: Diagnosis not present

## 2022-06-12 DIAGNOSIS — Z09 Encounter for follow-up examination after completed treatment for conditions other than malignant neoplasm: Secondary | ICD-10-CM | POA: Diagnosis not present

## 2022-06-12 DIAGNOSIS — N1832 Chronic kidney disease, stage 3b: Secondary | ICD-10-CM | POA: Diagnosis not present

## 2022-06-12 DIAGNOSIS — I25110 Atherosclerotic heart disease of native coronary artery with unstable angina pectoris: Secondary | ICD-10-CM | POA: Diagnosis not present

## 2022-06-12 DIAGNOSIS — I5032 Chronic diastolic (congestive) heart failure: Secondary | ICD-10-CM | POA: Diagnosis not present

## 2022-06-12 DIAGNOSIS — I10 Essential (primary) hypertension: Secondary | ICD-10-CM | POA: Diagnosis not present

## 2022-06-24 ENCOUNTER — Ambulatory Visit: Payer: PPO | Admitting: Family

## 2022-07-01 ENCOUNTER — Ambulatory Visit: Payer: PPO | Admitting: Family

## 2022-07-15 DIAGNOSIS — Z96641 Presence of right artificial hip joint: Secondary | ICD-10-CM | POA: Diagnosis not present

## 2022-07-15 DIAGNOSIS — M7071 Other bursitis of hip, right hip: Secondary | ICD-10-CM | POA: Diagnosis not present

## 2022-07-15 DIAGNOSIS — M7072 Other bursitis of hip, left hip: Secondary | ICD-10-CM | POA: Diagnosis not present

## 2022-07-23 DIAGNOSIS — H40153 Residual stage of open-angle glaucoma, bilateral: Secondary | ICD-10-CM | POA: Diagnosis not present

## 2022-08-06 ENCOUNTER — Other Ambulatory Visit: Payer: Self-pay | Admitting: *Deleted

## 2022-08-06 DIAGNOSIS — N181 Chronic kidney disease, stage 1: Secondary | ICD-10-CM

## 2022-08-06 MED ORDER — METOPROLOL SUCCINATE ER 100 MG PO TB24: 100.0000 mg | ORAL_TABLET | Freq: Every day | ORAL | 1 refills | Status: DC

## 2022-08-27 ENCOUNTER — Encounter: Payer: Self-pay | Admitting: *Deleted

## 2022-08-27 ENCOUNTER — Emergency Department: Payer: PPO

## 2022-08-27 ENCOUNTER — Inpatient Hospital Stay
Admission: EM | Admit: 2022-08-27 | Discharge: 2022-09-09 | DRG: 178 | Disposition: A | Discharge status: Skilled Nursing Facility | Payer: PPO | Attending: Internal Medicine | Admitting: Internal Medicine

## 2022-08-27 ENCOUNTER — Other Ambulatory Visit: Payer: Self-pay

## 2022-08-27 DIAGNOSIS — Z66 Do not resuscitate: Secondary | ICD-10-CM | POA: Diagnosis present

## 2022-08-27 DIAGNOSIS — G20 Parkinson's disease: Secondary | ICD-10-CM | POA: Diagnosis present

## 2022-08-27 DIAGNOSIS — N390 Urinary tract infection, site not specified: Secondary | ICD-10-CM

## 2022-08-27 DIAGNOSIS — Z8616 Personal history of COVID-19: Secondary | ICD-10-CM | POA: Diagnosis not present

## 2022-08-27 DIAGNOSIS — Z7401 Bed confinement status: Secondary | ICD-10-CM | POA: Diagnosis not present

## 2022-08-27 DIAGNOSIS — I4510 Unspecified right bundle-branch block: Secondary | ICD-10-CM | POA: Diagnosis present

## 2022-08-27 DIAGNOSIS — Z885 Allergy status to narcotic agent status: Secondary | ICD-10-CM

## 2022-08-27 DIAGNOSIS — F419 Anxiety disorder, unspecified: Secondary | ICD-10-CM | POA: Diagnosis not present

## 2022-08-27 DIAGNOSIS — H9190 Unspecified hearing loss, unspecified ear: Secondary | ICD-10-CM | POA: Diagnosis not present

## 2022-08-27 DIAGNOSIS — U071 COVID-19: Secondary | ICD-10-CM | POA: Diagnosis not present

## 2022-08-27 DIAGNOSIS — R531 Weakness: Secondary | ICD-10-CM | POA: Diagnosis not present

## 2022-08-27 DIAGNOSIS — I10 Essential (primary) hypertension: Secondary | ICD-10-CM | POA: Diagnosis not present

## 2022-08-27 DIAGNOSIS — Z888 Allergy status to other drugs, medicaments and biological substances status: Secondary | ICD-10-CM

## 2022-08-27 DIAGNOSIS — W19XXXA Unspecified fall, initial encounter: Secondary | ICD-10-CM | POA: Diagnosis not present

## 2022-08-27 DIAGNOSIS — I739 Peripheral vascular disease, unspecified: Secondary | ICD-10-CM | POA: Diagnosis present

## 2022-08-27 DIAGNOSIS — M109 Gout, unspecified: Secondary | ICD-10-CM | POA: Diagnosis not present

## 2022-08-27 DIAGNOSIS — G250 Essential tremor: Secondary | ICD-10-CM | POA: Diagnosis not present

## 2022-08-27 DIAGNOSIS — K2100 Gastro-esophageal reflux disease with esophagitis, without bleeding: Secondary | ICD-10-CM | POA: Diagnosis not present

## 2022-08-27 DIAGNOSIS — N1831 Chronic kidney disease, stage 3a: Secondary | ICD-10-CM | POA: Diagnosis present

## 2022-08-27 DIAGNOSIS — M6259 Muscle wasting and atrophy, not elsewhere classified, multiple sites: Secondary | ICD-10-CM | POA: Diagnosis not present

## 2022-08-27 DIAGNOSIS — R29898 Other symptoms and signs involving the musculoskeletal system: Secondary | ICD-10-CM | POA: Diagnosis not present

## 2022-08-27 DIAGNOSIS — N189 Chronic kidney disease, unspecified: Secondary | ICD-10-CM | POA: Diagnosis not present

## 2022-08-27 DIAGNOSIS — I700 Atherosclerosis of aorta: Secondary | ICD-10-CM | POA: Diagnosis present

## 2022-08-27 DIAGNOSIS — R5381 Other malaise: Secondary | ICD-10-CM | POA: Diagnosis not present

## 2022-08-27 DIAGNOSIS — N179 Acute kidney failure, unspecified: Secondary | ICD-10-CM | POA: Diagnosis not present

## 2022-08-27 DIAGNOSIS — E876 Hypokalemia: Secondary | ICD-10-CM | POA: Diagnosis not present

## 2022-08-27 DIAGNOSIS — R2681 Unsteadiness on feet: Secondary | ICD-10-CM | POA: Diagnosis not present

## 2022-08-27 DIAGNOSIS — Z882 Allergy status to sulfonamides status: Secondary | ICD-10-CM | POA: Diagnosis not present

## 2022-08-27 DIAGNOSIS — I5031 Acute diastolic (congestive) heart failure: Secondary | ICD-10-CM | POA: Diagnosis not present

## 2022-08-27 DIAGNOSIS — H409 Unspecified glaucoma: Secondary | ICD-10-CM | POA: Diagnosis not present

## 2022-08-27 DIAGNOSIS — G4733 Obstructive sleep apnea (adult) (pediatric): Secondary | ICD-10-CM | POA: Diagnosis not present

## 2022-08-27 DIAGNOSIS — J301 Allergic rhinitis due to pollen: Secondary | ICD-10-CM | POA: Diagnosis not present

## 2022-08-27 DIAGNOSIS — I130 Hypertensive heart and chronic kidney disease with heart failure and stage 1 through stage 4 chronic kidney disease, or unspecified chronic kidney disease: Secondary | ICD-10-CM | POA: Diagnosis present

## 2022-08-27 DIAGNOSIS — Z96651 Presence of right artificial knee joint: Secondary | ICD-10-CM | POA: Diagnosis present

## 2022-08-27 DIAGNOSIS — I2510 Atherosclerotic heart disease of native coronary artery without angina pectoris: Secondary | ICD-10-CM | POA: Diagnosis not present

## 2022-08-27 DIAGNOSIS — D869 Sarcoidosis, unspecified: Secondary | ICD-10-CM | POA: Diagnosis present

## 2022-08-27 DIAGNOSIS — G20A1 Parkinson's disease without dyskinesia, without mention of fluctuations: Secondary | ICD-10-CM | POA: Diagnosis not present

## 2022-08-27 DIAGNOSIS — Z79899 Other long term (current) drug therapy: Secondary | ICD-10-CM

## 2022-08-27 DIAGNOSIS — M6281 Muscle weakness (generalized): Secondary | ICD-10-CM | POA: Diagnosis not present

## 2022-08-27 DIAGNOSIS — M170 Bilateral primary osteoarthritis of knee: Secondary | ICD-10-CM | POA: Diagnosis not present

## 2022-08-27 DIAGNOSIS — R410 Disorientation, unspecified: Secondary | ICD-10-CM | POA: Diagnosis not present

## 2022-08-27 DIAGNOSIS — I5032 Chronic diastolic (congestive) heart failure: Secondary | ICD-10-CM | POA: Diagnosis present

## 2022-08-27 DIAGNOSIS — Z8249 Family history of ischemic heart disease and other diseases of the circulatory system: Secondary | ICD-10-CM

## 2022-08-27 DIAGNOSIS — S0990XA Unspecified injury of head, initial encounter: Secondary | ICD-10-CM | POA: Diagnosis not present

## 2022-08-27 DIAGNOSIS — Z96643 Presence of artificial hip joint, bilateral: Secondary | ICD-10-CM | POA: Diagnosis present

## 2022-08-27 DIAGNOSIS — R41841 Cognitive communication deficit: Secondary | ICD-10-CM | POA: Diagnosis not present

## 2022-08-27 DIAGNOSIS — G629 Polyneuropathy, unspecified: Secondary | ICD-10-CM | POA: Diagnosis present

## 2022-08-27 DIAGNOSIS — J320 Chronic maxillary sinusitis: Secondary | ICD-10-CM | POA: Diagnosis not present

## 2022-08-27 DIAGNOSIS — I6529 Occlusion and stenosis of unspecified carotid artery: Secondary | ICD-10-CM | POA: Diagnosis not present

## 2022-08-27 DIAGNOSIS — D513 Other dietary vitamin B12 deficiency anemia: Secondary | ICD-10-CM | POA: Diagnosis not present

## 2022-08-27 LAB — CBC
HCT: 33.7 % — ABNORMAL LOW (ref 36.0–46.0)
Hemoglobin: 10.4 g/dL — ABNORMAL LOW (ref 12.0–15.0)
MCH: 28 pg (ref 26.0–34.0)
MCHC: 30.9 g/dL (ref 30.0–36.0)
MCV: 90.6 fL (ref 80.0–100.0)
Platelets: 221 10*3/uL (ref 150–400)
RBC: 3.72 MIL/uL — ABNORMAL LOW (ref 3.87–5.11)
RDW: 17.7 % — ABNORMAL HIGH (ref 11.5–15.5)
WBC: 7.1 10*3/uL (ref 4.0–10.5)
nRBC: 0 % (ref 0.0–0.2)

## 2022-08-27 LAB — BASIC METABOLIC PANEL
Anion gap: 7 (ref 5–15)
BUN: 19 mg/dL (ref 8–23)
CO2: 25 mmol/L (ref 22–32)
Calcium: 9 mg/dL (ref 8.9–10.3)
Chloride: 107 mmol/L (ref 98–111)
Creatinine, Ser: 1.48 mg/dL — ABNORMAL HIGH (ref 0.44–1.00)
GFR, Estimated: 32 mL/min — ABNORMAL LOW (ref >60)
Glucose, Bld: 121 mg/dL — ABNORMAL HIGH (ref 70–99)
Potassium: 4.6 mmol/L (ref 3.5–5.1)
Sodium: 139 mmol/L (ref 135–145)

## 2022-08-27 LAB — URINALYSIS, ROUTINE W REFLEX MICROSCOPIC
Bilirubin Urine: NEGATIVE
Glucose, UA: NEGATIVE mg/dL
Ketones, ur: NEGATIVE mg/dL
Nitrite: NEGATIVE
Protein, ur: 30 mg/dL — AB
Specific Gravity, Urine: 1.029 (ref 1.005–1.030)
WBC, UA: >50 WBC/hpf — ABNORMAL HIGH (ref 0–5)
pH: 5 (ref 5.0–8.0)

## 2022-08-27 LAB — RESP PANEL BY RT-PCR (FLU A&B, COVID) ARPGX2
Influenza A by PCR: NEGATIVE
Influenza B by PCR: NEGATIVE
SARS Coronavirus 2 by RT PCR: POSITIVE — AB

## 2022-08-27 LAB — TROPONIN I (HIGH SENSITIVITY): Troponin I (High Sensitivity): 8 ng/L (ref <18)

## 2022-08-27 LAB — TSH: TSH: 2.922 u[IU]/mL (ref 0.350–4.500)

## 2022-08-27 LAB — BRAIN NATRIURETIC PEPTIDE: B Natriuretic Peptide: 59 pg/mL (ref 0.0–100.0)

## 2022-08-27 MED ORDER — PANTOPRAZOLE SODIUM 40 MG PO TBEC
40.0000 mg | DELAYED_RELEASE_TABLET | Freq: Two times a day (BID) | ORAL | Status: DC
  Administered 2022-08-28 – 2022-09-09 (×26): 40 mg via ORAL
  Filled 2022-08-27 (×26): qty 1

## 2022-08-27 MED ORDER — METOPROLOL SUCCINATE ER 50 MG PO TB24
100.0000 mg | ORAL_TABLET | Freq: Every day | ORAL | Status: DC
  Administered 2022-08-28 – 2022-09-09 (×13): 100 mg via ORAL
  Filled 2022-08-27 (×13): qty 2

## 2022-08-27 MED ORDER — SODIUM CHLORIDE 0.9 % IV SOLN: INTRAVENOUS | Status: DC

## 2022-08-27 MED ORDER — LATANOPROST 0.005 % OP SOLN
1.0000 [drp] | Freq: Every day | OPHTHALMIC | Status: DC
  Administered 2022-08-28 – 2022-09-08 (×12): 1 [drp] via OPHTHALMIC
  Filled 2022-08-27 (×2): qty 2.5

## 2022-08-27 MED ORDER — ACETAMINOPHEN 325 MG PO TABS
650.0000 mg | ORAL_TABLET | Freq: Four times a day (QID) | ORAL | Status: DC | PRN
  Administered 2022-08-31 – 2022-09-09 (×7): 650 mg via ORAL
  Filled 2022-08-27 (×8): qty 2

## 2022-08-27 MED ORDER — HALOPERIDOL 0.5 MG PO TABS: 5.0000 mg | ORAL_TABLET | ORAL | Status: DC | PRN

## 2022-08-27 MED ORDER — SENNA 8.6 MG PO TABS
2.0000 | ORAL_TABLET | Freq: Two times a day (BID) | ORAL | Status: DC
  Administered 2022-08-28 – 2022-09-09 (×26): 17.2 mg via ORAL
  Filled 2022-08-27 (×26): qty 2

## 2022-08-27 MED ORDER — SODIUM CHLORIDE 0.9 % IV SOLN
1.0000 g | INTRAVENOUS | Status: AC
  Administered 2022-08-28 – 2022-08-29 (×2): 1 g via INTRAVENOUS
  Filled 2022-08-27 (×2): qty 1

## 2022-08-27 MED ORDER — ENOXAPARIN SODIUM 40 MG/0.4ML IJ SOSY
40.0000 mg | PREFILLED_SYRINGE | INTRAMUSCULAR | Status: DC
  Administered 2022-08-28: 40 mg via SUBCUTANEOUS
  Filled 2022-08-27: qty 0.4

## 2022-08-27 MED ORDER — SODIUM CHLORIDE 0.9 % IV SOLN
1.0000 g | Freq: Once | INTRAVENOUS | Status: AC
  Administered 2022-08-27: 1 g via INTRAVENOUS
  Filled 2022-08-27: qty 10

## 2022-08-27 MED ORDER — MAGNESIUM HYDROXIDE 400 MG/5ML PO SUSP
30.0000 mL | Freq: Every day | ORAL | Status: DC | PRN
  Administered 2022-08-28 – 2022-09-09 (×4): 30 mL via ORAL
  Filled 2022-08-27 (×4): qty 30

## 2022-08-27 MED ORDER — VITAMIN B-12 1000 MCG PO TABS
1000.0000 ug | ORAL_TABLET | Freq: Every day | ORAL | Status: DC
  Administered 2022-08-28 – 2022-09-06 (×10): 1000 ug via ORAL
  Filled 2022-08-27 (×12): qty 1

## 2022-08-27 MED ORDER — PREDNISONE 20 MG PO TABS
20.0000 mg | ORAL_TABLET | Freq: Once | ORAL | Status: AC
  Administered 2022-08-27: 20 mg via ORAL
  Filled 2022-08-27: qty 1

## 2022-08-27 MED ORDER — TRAZODONE HCL 50 MG PO TABS
25.0000 mg | ORAL_TABLET | Freq: Every evening | ORAL | Status: DC | PRN
  Administered 2022-08-29 – 2022-08-30 (×2): 25 mg via ORAL
  Filled 2022-08-27 (×2): qty 1

## 2022-08-27 MED ORDER — FUROSEMIDE 10 MG/ML IJ SOLN
40.0000 mg | Freq: Once | INTRAMUSCULAR | Status: AC
Start: 2022-08-27 — End: 2022-08-27
  Administered 2022-08-27: 40 mg via INTRAVENOUS
  Filled 2022-08-27: qty 4

## 2022-08-27 MED ORDER — ONDANSETRON HCL 4 MG/2ML IJ SOLN: 4.0000 mg | Freq: Four times a day (QID) | INTRAMUSCULAR | Status: DC | PRN

## 2022-08-27 MED ORDER — IPRATROPIUM-ALBUTEROL 0.5-2.5 (3) MG/3ML IN SOLN: 3.0000 mL | RESPIRATORY_TRACT | Status: DC | PRN

## 2022-08-27 MED ORDER — ONDANSETRON 4 MG PO TBDP
4.0000 mg | ORAL_TABLET | Freq: Four times a day (QID) | ORAL | Status: DC | PRN
  Administered 2022-08-30 – 2022-09-06 (×2): 4 mg via ORAL
  Filled 2022-08-27 (×2): qty 1

## 2022-08-27 MED ORDER — PROCHLORPERAZINE MALEATE 10 MG PO TABS
10.0000 mg | ORAL_TABLET | ORAL | Status: DC | PRN
Start: 2022-08-27 — End: 2022-09-09

## 2022-08-27 MED ORDER — FERROUS SULFATE 325 (65 FE) MG PO TABS
325.0000 mg | ORAL_TABLET | Freq: Two times a day (BID) | ORAL | Status: DC
  Administered 2022-08-28 – 2022-09-08 (×24): 325 mg via ORAL
  Filled 2022-08-27 (×25): qty 1

## 2022-08-27 MED ORDER — ACETAMINOPHEN 650 MG RE SUPP: 650.0000 mg | Freq: Four times a day (QID) | RECTAL | Status: DC | PRN

## 2022-08-27 MED ORDER — ONDANSETRON HCL 4 MG PO TABS: 4.0000 mg | ORAL_TABLET | Freq: Four times a day (QID) | ORAL | Status: DC | PRN

## 2022-08-27 NOTE — ED Triage Notes (Addendum)
Pt to triage via wheelchair.  Pt reports weakness for 1 month.  Pt reports congestion and cough. No chest pain or sob. Pt spitting up clear phlegm in triage.  No headache.  No n/v/     Pt alert  speech clear.

## 2022-08-27 NOTE — ED Provider Notes (Signed)
[facility] Provider Note    Event Date/Time   First MD Initiated Contact with Patient 08/27/22 2146     (approximate)   History   Weakness   HPI  Sheila Long is a 88 y.o. female with a history of CHF, Parkinson's, chronic kidney disease, GERD, OSA, and peripheral vascular disease who presents with increased mucus and sputum production and associated shortness of breath over the last few weeks, as well as bilateral lower extremity weakness and increased difficulty walking over the last several months.  The patient states that she came in tonight because she just could not take the mucus anymore.  She has been coughing but has no fever.  She denies any chest pain.  She has no vomiting or diarrhea.  She has been taking Mucinex at home without relief.    Physical Exam   Triage Vital Signs: ED Triage Vitals  Enc Vitals Group     BP 08/27/22 2139 (!) 158/80     Pulse Rate 08/27/22 2139 89     Resp 08/27/22 2139 20     Temp 08/27/22 2139 98.5 F (36.9 C)     Temp Source 08/27/22 2139 Oral     SpO2 08/27/22 2139 97 %     Weight 08/27/22 2137 207 lb (93.9 kg)     Height 08/27/22 2137 5\' 4"  (1.626 m)     Head Circumference --      Peak Flow --      Pain Score 08/27/22 2136 7     Pain Loc --      Pain Edu? --      Excl. in GC? --     Most recent vital signs: Vitals:   08/27/22 2139 08/27/22 2300  BP: (!) 158/80 (!) 147/64  Pulse: 89 67  Resp: 20 20  Temp: 98.5 F (36.9 C)   SpO2: 97% 98%     General: Alert and oriented, comfortable appearing. CV:  Good peripheral perfusion.  Normal heart sounds. Resp:  Normal effort.  Lungs with slight coarse breath sounds bilaterally. Abd:  No distention.  Other:  1+ bilateral lower extremity edema.  Oropharynx clear with no erythema, exudate, or pooled secretions.   ED Results / Procedures / Treatments   Labs (all labs ordered are listed, but only abnormal results are displayed) Labs Reviewed   BASIC METABOLIC PANEL - Abnormal; Notable for the following components:      Result Value   Glucose, Bld 121 (*)    Creatinine, Ser 1.48 (*)    GFR, Estimated 32 (*)    All other components within normal limits  CBC - Abnormal; Notable for the following components:   RBC 3.72 (*)    Hemoglobin 10.4 (*)    HCT 33.7 (*)    RDW 17.7 (*)    All other components within normal limits  URINALYSIS, ROUTINE W REFLEX MICROSCOPIC - Abnormal; Notable for the following components:   Color, Urine AMBER (*)    APPearance TURBID (*)    Hgb urine dipstick MODERATE (*)    Protein, ur 30 (*)    Leukocytes,Ua LARGE (*)    WBC, UA >50 (*)    Bacteria, UA MANY (*)    All other components within normal limits  RESP PANEL BY RT-PCR (FLU A&B, COVID) ARPGX2  BRAIN NATRIURETIC PEPTIDE  TSH  TROPONIN I (HIGH SENSITIVITY)     EKG  ED ECG REPORT I, 08/29/22, the attending physician, personally viewed and interpreted  this ECG.  Date: 08/27/2022 EKG Time: 2142 Rate: 73 Rhythm: normal sinus rhythm QRS Axis: normal Intervals: RBBB ST/T Wave abnormalities: Nonspecific abnormalities Narrative Interpretation: Nonspecific abnormalities with no evidence of acute ischemia; new right bundle when compared EKG of 06/01/2022    RADIOLOGY  Chest x-ray: I independently viewed and interpreted the images; there is no focal consolidation or edema  PROCEDURES:  Critical Care performed: No  Procedures   MEDICATIONS ORDERED IN ED: Medications  predniSONE (DELTASONE) tablet 20 mg (has no administration in time range)  furosemide (LASIX) injection 40 mg (has no administration in time range)  cefTRIAXone (ROCEPHIN) 1 g in sodium chloride 0.9 % 100 mL IVPB (has no administration in time range)     IMPRESSION / MDM / ASSESSMENT AND PLAN / ED COURSE  I reviewed the triage vital signs and the nursing notes.  88 year old female with PMH as noted above presents with increased mucus production,  shortness of breath, as well as bilateral leg weakness and increased difficulty walking.  I reviewed the past medical records.  The patient was most recently admitted in June.  Per the hospitalist discharge summary from 06/06/2022 she presented at that time with increased shortness of breath requiring 2 L of O2.  She was diagnosed with acute CHF exacerbation.  On exam the patient is overall comfortable appearing.  O2 saturation is in the high 90s on room air.  She has no acute respiratory distress.  She is spitting up some mucus but is able to clear her secretions.  There is mild lower extremity edema.  Differential diagnosis includes, but is not limited to, acute bronchitis, viral infection, pneumonia, progression of chronic lung disease, CHF.  Patient's presentation is most consistent with acute presentation with potential threat to life or bodily function.  We will obtain chest x-ray, lab work-up, and reassess.  The patient is on the cardiac monitor to evaluate for evidence of arrhythmia and/or significant heart rate changes.  ----------------------------------------- 11:04 PM on 08/27/2022 -----------------------------------------  Urinalysis is consistent with UTI which likely explains the patient's weakness and unsteadiness on her feet.  Chest x-ray is clear.  I have ordered IV ceftriaxone.  Given that the patient is acutely weak, we will plan for admission.   FINAL CLINICAL IMPRESSION(S) / ED DIAGNOSES   Final diagnoses:  Generalized weakness  Urinary tract infection without hematuria, site unspecified     Rx / DC Orders   ED Discharge Orders     None        Note:  This document was prepared using Dragon voice recognition software and may include unintentional dictation errors.    Dionne Bucy, MD 08/27/22 [phone]

## 2022-08-27 NOTE — H&P (Signed)
[facility]   PATIENT NAME: Sheila Long    MR#:  [id number]  DATE OF BIRTH:  1924-06-19  DATE OF ADMISSION:  08/27/2022  PRIMARY CARE PHYSICIAN: Enid Baas, MD   Patient is coming from: Home  REQUESTING/REFERRING PHYSICIAN: Miki Kins, MD  CHIEF COMPLAINT:   Chief Complaint  Patient presents with   Weakness    HISTORY OF PRESENT ILLNESS:  Sheila Long is a 88 y.o. Caucasian female with medical history significant for CHF coronary artery disease, GERD, hypertension, osteoarthritis, peripheral vascular disease, anxiety and sarcoidosis, who presented to the emergency room with acute onset of worsening dyspnea with associated cough productive of clear sputum and occasional wheezing which have been going on for the last week.  She denied any fever or chills.  No dysuria, oliguria or hematuria urgency or frequency or flank pain.  She has not been feeling any energy.  No chest pain or palpitations.  No nausea or vomiting or diarrhea.  No loss of taste or smell.  She has been constipated.  She denies any abdominal pain or melena or bright red bleeding per rectum.  No other bleeding diathesis.  ED Course: Upon presentation to the emergency room, BP was 158/80 with otherwise normal vital signs.  Labs revealed a creatinine of 1.48 up from 1.22 and BNP of 59 with high sensitive troponin I of 8 twice and CBC showed mild anemia better than previous levels.  TSH was 2.92.  Influenza antigens came back negative.  COVID-19 PCR came back positive EKG as reviewed by me : EKG showed normal sinus rhythm with a rate of 73 with sinus arrhythmia and right bundle branch block Imaging: Portable chest x-ray showed no acute cardiopulmonary disease.  The patient was given a gram of IV Rocephin and 40 mg of IV Lasix with 20 mg of p.o. prednisone.  She will be admitted to a medical telemetry bed for further evaluation and management. PAST MEDICAL HISTORY:   Past Medical  History:  Diagnosis Date   Allergic rhinitis due to pollen    Anxiety    Aortic atherosclerosis (HCC)    CHF (congestive heart failure) (HCC)    Coronary artery disease    Stent in 2000's (DC)   Dyspnea    GERD (gastroesophageal reflux disease)    Hard of hearing    Hypertension    Neuropathy    Obstructive sleep apnea    Osteoarthritis of both knees    Peripheral vascular disease (HCC)    Sarcoidosis    Thyroid nodule     PAST SURGICAL HISTORY:   Past Surgical History:  Procedure Laterality Date   ABDOMINAL HYSTERECTOMY     BIOPSY THYROID  2014   Dr Andee Poles   BREAST EXCISIONAL BIOPSY Left 1980s   surgical bx neg   BREAST EXCISIONAL BIOPSY Left 1980s   benign   CARDIOVASCULAR STRESS TEST  05/12/2012   with dobutamine---negative. Normal LV function   CORONARY ANGIOPLASTY  2006   ESOPHAGOGASTRODUODENOSCOPY (EGD) WITH PROPOFOL N/A 04/03/2020   Procedure: ESOPHAGOGASTRODUODENOSCOPY (EGD) WITH PROPOFOL;  Surgeon: [name], MD;  Location: [facility] ENDOSCOPY;  Service: Gastroenterology;  Laterality: N/A;   JOINT REPLACEMENT Left 2000   hip   JOINT REPLACEMENT Right 2007   hip   KNEE ARTHROPLASTY Right 06/23/2017   Procedure: COMPUTER ASSISTED TOTAL KNEE ARTHROPLASTY;  Surgeon: Donato Heinz, MD;  Location: [facility] ORS;  Service: Orthopedics;  Laterality: Right;    SOCIAL HISTORY:   Social History  Tobacco Use   Smoking status: Never   Smokeless tobacco: Never  Substance Use Topics   Alcohol use: No    FAMILY HISTORY:   Family History  Problem Relation Age of Onset   Cancer Sister 22       unknown - groin area   Heart attack Brother    Cancer Sister 83       pancreatic    Heart attack Sister    Heart attack Brother     DRUG ALLERGIES:   Allergies  Allergen Reactions   Tramadol Other (See Comments)    CONFUSION   Sulfa Antibiotics     Patient does not recall   Zocor [Simvastatin] Other (See Comments)    INTOLERANCE-MYALGIAS     REVIEW OF SYSTEMS:    ROS As per history of present illness. All pertinent systems were reviewed above. Constitutional, HEENT, cardiovascular, respiratory, GI, GU, musculoskeletal, neuro, psychiatric, endocrine, integumentary and hematologic systems were reviewed and are otherwise negative/unremarkable except for positive findings mentioned above in the HPI.   MEDICATIONS AT HOME:   Prior to Admission medications   Medication Sig Start Date End Date Taking? Authorizing Provider  ferrous sulfate 325 (65 FE) MG EC tablet Take 1 tablet (325 mg total) by mouth 2 (two) times daily. 04/23/22 04/23/23  Arnetha Courser, MD  furosemide (LASIX) 40 MG tablet Take 40 mg by mouth daily. 05/21/22   [provider]  haloperidol (HALDOL) 5 MG tablet Take 1 tablet (5 mg total) by mouth every 4 (four) hours as needed for up to 3 days for agitation. May crush, mix with water and give sublingually if needed. 06/06/22 06/09/22  Delfino Lovett, MD  ipratropium-albuterol (DUONEB) 0.5-2.5 (3) MG/3ML SOLN Take 3 mLs by nebulization every 4 (four) hours as needed (shortness of breath.). 06/06/22   Delfino Lovett, MD  latanoprost (XALATAN) 0.005 % ophthalmic solution Place 1 drop into both eyes at bedtime. 05/30/17   [provider]  metoprolol succinate (TOPROL-XL) 100 MG 24 hr tablet Take 1 tablet (100 mg total) by mouth daily. Take with or immediately following a meal. 08/06/22   [name], MD  olmesartan-hydrochlorothiazide (BENICAR HCT) 40-12.5 MG tablet Take 1 tablet by mouth daily. 05/15/22   [provider]  ondansetron (ZOFRAN-ODT) 4 MG disintegrating tablet Take 1 tablet (4 mg total) by mouth every 8 (eight) hours as needed. 05/14/22   Delton Prairie, MD  pantoprazole (PROTONIX) 40 MG tablet Take 1 tablet (40 mg total) by mouth 2 (two) times daily. 04/23/22   Arnetha Courser, MD  prochlorperazine (COMPAZINE) 10 MG tablet Take 1 tablet (10 mg total) by mouth every 4 (four) hours as needed for nausea or vomiting. May crush, mix  with water and give sublingually. 06/06/22   Delfino Lovett, MD  senna (SENOKOT) 8.6 MG tablet Take 2 tablets (17.2 mg total) by mouth 2 (two) times daily. May crush, mix with water and give sublingually if needed. 06/06/22   Delfino Lovett, MD  spironolactone (ALDACTONE) 25 MG tablet Take 25 mg by mouth daily. 05/21/22   [provider]  vitamin B-12 1000 MCG tablet Take 1 tablet (1,000 mcg total) by mouth daily. 04/23/22   Arnetha Courser, MD      VITAL SIGNS:  Blood pressure (!) 149/64, pulse 71, temperature 98.4 F (36.9 C), temperature source Oral, resp. rate (!) 29, height 5\' 4"  (1.626 m), weight 93.9 kg, SpO2 97 %.  PHYSICAL EXAMINATION:  Physical Exam  GENERAL:  88 y.o.-year-old patient lying in  the bed with no acute distress.  EYES: Pupils equal, round, reactive to light and accommodation. No scleral icterus. Extraocular muscles intact.  HEENT: Head atraumatic, normocephalic. Oropharynx and nasopharynx clear.  NECK:  Supple, no jugular venous distention. No thyroid enlargement, no tenderness.  LUNGS: Mild diminished bibasilar breath sounds with associated rhonchi.  No use of accessory muscles of respiration.  CARDIOVASCULAR: Regular rate and rhythm, S1, S2 normal. No murmurs, rubs, or gallops.  ABDOMEN: Soft, nondistended, nontender. Bowel sounds present. No organomegaly or mass.  EXTREMITIES: No pedal edema, cyanosis, or clubbing.  NEUROLOGIC: Cranial nerves II through XII are intact. Muscle strength 5/5 in all extremities. Sensation intact. Gait not checked.  PSYCHIATRIC: The patient is alert and oriented x 3.  Normal affect and good eye contact. SKIN: No obvious rash, lesion, or ulcer.   LABORATORY PANEL:   CBC Recent Labs  Lab 08/27/22 2207  WBC 7.1  HGB 10.4*  HCT 33.7*  PLT 221   ------------------------------------------------------------------------------------------------------------------  Chemistries  Recent Labs  Lab 08/27/22 2207  NA 139  K 4.6  CL 107   CO2 25  GLUCOSE 121*  BUN 19  CREATININE 1.48*  CALCIUM 9.0   ------------------------------------------------------------------------------------------------------------------  Cardiac Enzymes No results for input(s): "TROPONINI" in the last 168 hours. ------------------------------------------------------------------------------------------------------------------  RADIOLOGY:  DG Chest Port 1 View  Result Date: 08/27/2022 CLINICAL DATA:  Weakness for 1 month EXAM: PORTABLE CHEST 1 VIEW COMPARISON:  06/03/2022 FINDINGS: Cardiac shadow is mildly enlarged but stable. Aortic calcifications are seen. Lungs are clear. No acute bony abnormality is noted. IMPRESSION: No active disease. Electronically Signed   By: Inez Catalina M.D.   On: 08/27/2022 22:45      IMPRESSION AND PLAN:  Assessment and Plan: * COVID-19 virus infection - The patient will be admitted to a medical telemetry bed. - This would explain her respiratory symptoms and generalized weakness. - She will be placed on COVID-19 isolation. - We will start on p.o. molnupiravir given her AKI on CKD. - We will follow inflammatory markers. - She will be placed on vitamin C, vitamin D3 and zinc sulfate.  UTI (urinary tract infection) - She will be placed on IV Rocephin. - We will follow urine culture and sensitivity.  Acute kidney injury superimposed on chronic kidney disease ([place]) - She has AKI superimposed on stage IIIa chronic kidney disease. - She will be hydrated with IV normal saline and will follow BMP. - We will avoid nephrotoxins.  Essential hypertension - We will continue her antihypertensives while holding off diuretic therapy and ARB given acute kidney injury.  GERD with esophagitis - We will continue PPI therapy.    DVT prophylaxis: Lovenox.  Advanced Care Planning:  Code Status: She is DNR/DNI.  This was clearly discussed with her.  Family Communication:  The plan of care was discussed in details with the  patient (and family). I answered all questions. The patient agreed to proceed with the above mentioned plan. Further management will depend upon hospital course. Disposition Plan: Back to previous home environment Consults called: none.  All the records are reviewed and case discussed with ED provider.  Status is: Inpatient   At the time of the admission, it appears that the appropriate admission status for this patient is inpatient.  This is judged to be reasonable and necessary in order to provide the required intensity of service to ensure the patient's safety given the presenting symptoms, physical exam findings and initial radiographic and laboratory data in the context of comorbid  conditions.  The patient requires inpatient status due to high intensity of service, high risk of further deterioration and high frequency of surveillance required.  I certify that at the time of admission, it is my clinical judgment that the patient will require inpatient hospital care extending more than 2 midnights.                            Dispo: The patient is from: Home              Anticipated d/c is to: Home              Patient currently is not medically stable to d/c.              Difficult to place patient: No  Christel Mormon M.D on 08/28/2022 at 2:43 AM  [facility]   From 7 PM-7 AM, contact night-coverage [email or web address]  CC: Primary care physician; Gladstone Lighter, MD

## 2022-08-28 DIAGNOSIS — K2100 Gastro-esophageal reflux disease with esophagitis, without bleeding: Secondary | ICD-10-CM

## 2022-08-28 DIAGNOSIS — I10 Essential (primary) hypertension: Secondary | ICD-10-CM

## 2022-08-28 DIAGNOSIS — N179 Acute kidney failure, unspecified: Secondary | ICD-10-CM

## 2022-08-28 DIAGNOSIS — R531 Weakness: Principal | ICD-10-CM | POA: Insufficient documentation

## 2022-08-28 DIAGNOSIS — U071 COVID-19: Secondary | ICD-10-CM

## 2022-08-28 LAB — PROCALCITONIN: Procalcitonin: <0.1 ng/mL

## 2022-08-28 LAB — CBC
HCT: 32.1 % — ABNORMAL LOW (ref 36.0–46.0)
Hemoglobin: 9.8 g/dL — ABNORMAL LOW (ref 12.0–15.0)
MCH: 27.5 pg (ref 26.0–34.0)
MCHC: 30.5 g/dL (ref 30.0–36.0)
MCV: 90.2 fL (ref 80.0–100.0)
Platelets: 215 10*3/uL (ref 150–400)
RBC: 3.56 MIL/uL — ABNORMAL LOW (ref 3.87–5.11)
RDW: 17.5 % — ABNORMAL HIGH (ref 11.5–15.5)
WBC: 7 10*3/uL (ref 4.0–10.5)
nRBC: 0 % (ref 0.0–0.2)

## 2022-08-28 LAB — BASIC METABOLIC PANEL
Anion gap: 6 (ref 5–15)
BUN: 18 mg/dL (ref 8–23)
CO2: 27 mmol/L (ref 22–32)
Calcium: 8.5 mg/dL — ABNORMAL LOW (ref 8.9–10.3)
Chloride: 107 mmol/L (ref 98–111)
Creatinine, Ser: 1.38 mg/dL — ABNORMAL HIGH (ref 0.44–1.00)
GFR, Estimated: 35 mL/min — ABNORMAL LOW (ref >60)
Glucose, Bld: 143 mg/dL — ABNORMAL HIGH (ref 70–99)
Potassium: 3.9 mmol/L (ref 3.5–5.1)
Sodium: 140 mmol/L (ref 135–145)

## 2022-08-28 LAB — TROPONIN I (HIGH SENSITIVITY): Troponin I (High Sensitivity): 8 ng/L (ref <18)

## 2022-08-28 LAB — C-REACTIVE PROTEIN: CRP: <0.5 mg/dL (ref <1.0)

## 2022-08-28 LAB — LACTATE DEHYDROGENASE: LDH: 159 U/L (ref 98–192)

## 2022-08-28 LAB — FERRITIN: Ferritin: 16 ng/mL (ref 11–307)

## 2022-08-28 MED ORDER — HYDROCOD POLI-CHLORPHE POLI ER 10-8 MG/5ML PO SUER
5.0000 mL | Freq: Two times a day (BID) | ORAL | Status: DC | PRN
  Administered 2022-08-28 – 2022-09-08 (×8): 5 mL via ORAL
  Filled 2022-08-28 (×9): qty 5

## 2022-08-28 MED ORDER — ZINC SULFATE 220 (50 ZN) MG PO CAPS
220.0000 mg | ORAL_CAPSULE | Freq: Every day | ORAL | Status: DC
  Administered 2022-08-28 – 2022-09-07 (×11): 220 mg via ORAL
  Filled 2022-08-28 (×12): qty 1

## 2022-08-28 MED ORDER — ENOXAPARIN SODIUM 30 MG/0.3ML IJ SOSY
30.0000 mg | PREFILLED_SYRINGE | INTRAMUSCULAR | Status: DC
  Administered 2022-08-28: 30 mg via SUBCUTANEOUS
  Filled 2022-08-28: qty 0.3

## 2022-08-28 MED ORDER — VITAMIN C 500 MG PO TABS
500.0000 mg | ORAL_TABLET | Freq: Every day | ORAL | Status: DC
  Administered 2022-08-28 – 2022-09-06 (×9): 500 mg via ORAL
  Filled 2022-08-28 (×12): qty 1

## 2022-08-28 MED ORDER — GUAIFENESIN ER 600 MG PO TB12
600.0000 mg | ORAL_TABLET | Freq: Two times a day (BID) | ORAL | Status: DC
  Administered 2022-08-28 – 2022-09-07 (×23): 600 mg via ORAL
  Filled 2022-08-28 (×23): qty 1

## 2022-08-28 MED ORDER — VITAMIN D 25 MCG (1000 UNIT) PO TABS
1000.0000 [IU] | ORAL_TABLET | Freq: Every day | ORAL | Status: DC
  Administered 2022-08-28 – 2022-09-06 (×9): 1000 [IU] via ORAL
  Filled 2022-08-28 (×12): qty 1

## 2022-08-28 MED ORDER — MOLNUPIRAVIR EUA 200MG CAPSULE
4.0000 | ORAL_CAPSULE | Freq: Two times a day (BID) | ORAL | Status: AC
  Administered 2022-08-28 – 2022-09-01 (×10): 800 mg via ORAL
  Filled 2022-08-28: qty 4

## 2022-08-28 MED ORDER — GUAIFENESIN-DM 100-10 MG/5ML PO SYRP
10.0000 mL | ORAL_SOLUTION | ORAL | Status: DC | PRN
  Administered 2022-08-28 – 2022-09-08 (×5): 10 mL via ORAL
  Filled 2022-08-28 (×5): qty 10

## 2022-08-28 MED ORDER — MAGNESIUM HYDROXIDE 400 MG/5ML PO SUSP: 15.0000 mL | Freq: Every evening | ORAL | Status: DC | PRN

## 2022-08-28 MED ORDER — IPRATROPIUM-ALBUTEROL 20-100 MCG/ACT IN AERS
1.0000 | INHALATION_SPRAY | RESPIRATORY_TRACT | Status: DC | PRN
  Administered 2022-08-29 – 2022-09-04 (×4): 1 via RESPIRATORY_TRACT
  Filled 2022-08-28: qty 4

## 2022-08-28 NOTE — Assessment & Plan Note (Addendum)
-   The patient will be admitted to a medical telemetry bed. - This would explain her respiratory symptoms and generalized weakness. - She will be placed on COVID-19 isolation. - We will start on p.o. molnupiravir given her AKI on CKD. - We will follow inflammatory markers. - She will be placed on vitamin C, vitamin D3 and zinc sulfate.

## 2022-08-28 NOTE — ED Notes (Signed)
Pt transitioned to a hospital bed to promote comfort.  °

## 2022-08-28 NOTE — ED Notes (Signed)
Pt repositioned in the bed by Mariane Duval, Medic and this RN. Breakfast tray given at this time.

## 2022-08-28 NOTE — ED Notes (Signed)
Dr. Mansy @ the bedside. 

## 2022-08-28 NOTE — Progress Notes (Signed)
PROGRESS NOTE    Arleane Spinale  GYK:[id number] DOB: February 13, 1924 DOA: 08/27/2022 PCP: Enid Baas, MD    Brief Narrative:  Sheila Long is a 88 y.o. Caucasian female with medical history significant for CHF coronary artery disease, GERD, hypertension, osteoarthritis, peripheral vascular disease, anxiety and sarcoidosis, who presented to the emergency room with acute onset of worsening dyspnea with associated cough productive of clear sputum and occasional wheezing which have been going on for the last week.    COVID-19 PCR came back positive     Consultants:    Procedures:   Antimicrobials:      Subjective: Feeling less sob . No cp  Objective: Vitals:   08/28/22 0700 08/28/22 0730 08/28/22 0800 08/28/22 0830  BP: 121/64 116/75 130/75 (!) 143/80  Pulse: 69 69 70 78  Resp: (!) 22 19 (!) 23 19  Temp:      TempSrc:      SpO2: 95% 95% 95% 96%  Weight:      Height:        Intake/Output Summary (Last 24 hours) at 08/28/2022 0858 Last data filed at 08/28/2022 0144 Gross per 24 hour  Intake --  Output 500 ml  Net -500 ml   Filed Weights   08/27/22 2137  Weight: 93.9 kg    Examination:  Calm, NAD Mild rhonchi.  Reg s1/s2 no gallop Soft benign +bs No edema Awake and alert Mood and affect appropriate in current setting   Data Reviewed: I have personally reviewed following labs and imaging studies  CBC: Recent Labs  Lab 08/27/22 2207 08/28/22 0316  WBC 7.1 7.0  HGB 10.4* 9.8*  HCT 33.7* 32.1*  MCV 90.6 90.2  PLT 221 215   Basic Metabolic Panel: Recent Labs  Lab 08/27/22 2207 08/28/22 0316  NA 139 140  K 4.6 3.9  CL 107 107  CO2 25 27  GLUCOSE 121* 143*  BUN 19 18  CREATININE 1.48* 1.38*  CALCIUM 9.0 8.5*   GFR: Estimated Creatinine Clearance: 26.5 mL/min (A) (by C-G formula based on SCr of 1.38 mg/dL (H)). Liver Function Tests: No results for input(s): "AST", "ALT", "ALKPHOS", "BILITOT", "PROT", "ALBUMIN" in the last 168  hours. No results for input(s): "LIPASE", "AMYLASE" in the last 168 hours. No results for input(s): "AMMONIA" in the last 168 hours. Coagulation Profile: No results for input(s): "INR", "PROTIME" in the last 168 hours. Cardiac Enzymes: No results for input(s): "CKTOTAL", "CKMB", "CKMBINDEX", "TROPONINI" in the last 168 hours. BNP (last 3 results) No results for input(s): "PROBNP" in the last 8760 hours. HbA1C: No results for input(s): "HGBA1C" in the last 72 hours. CBG: No results for input(s): "GLUCAP" in the last 168 hours. Lipid Profile: No results for input(s): "CHOL", "HDL", "LDLCALC", "TRIG", "CHOLHDL", "LDLDIRECT" in the last 72 hours. Thyroid Function Tests: Recent Labs    08/27/22 2207  TSH 2.922   Anemia Panel: Recent Labs    08/28/22 0316  FERRITIN 16   Sepsis Labs: Recent Labs  Lab 08/28/22 0316  PROCALCITON <0.10    Recent Results (from the past 240 hour(s))  Resp Panel by RT-PCR (Flu A&B, Covid) Anterior Nasal Swab     Status: Abnormal   Collection Time: 08/27/22 10:06 PM   Specimen: Anterior Nasal Swab  Result Value Ref Range Status   SARS Coronavirus 2 by RT PCR POSITIVE (A) NEGATIVE Final    Comment: (NOTE) SARS-CoV-2 target nucleic acids are DETECTED.  The SARS-CoV-2 RNA is generally detectable in upper respiratory specimens during the acute  phase of infection. Positive results are indicative of the presence of the identified virus, but do not rule out bacterial infection or co-infection with other pathogens not detected by the test. Clinical correlation with patient history and other diagnostic information is necessary to determine patient infection status. The expected result is Negative.  Fact Sheet for Patients: BloggerCourse.com  Fact Sheet for Healthcare Providers: SeriousBroker.it  This test is not yet approved or cleared by the [place] FDA and  has been authorized for detection  and/or diagnosis of SARS-CoV-2 by FDA under an Emergency Use Authorization (EUA).  This EUA will remain in effect (meaning this test can be used) for the duration of  the COVID-19 declaration under Section 564(b)(1) of the A ct, 21 U.S.C. section 360bbb-3(b)(1), unless the authorization is terminated or revoked sooner.     Influenza A by PCR NEGATIVE NEGATIVE Final   Influenza B by PCR NEGATIVE NEGATIVE Final    Comment: (NOTE) The Xpert Xpress SARS-CoV-2/FLU/RSV plus assay is intended as an aid in the diagnosis of influenza from Nasopharyngeal swab specimens and should not be used as a sole basis for treatment. Nasal washings and aspirates are unacceptable for Xpert Xpress SARS-CoV-2/FLU/RSV testing.  Fact Sheet for Patients: BloggerCourse.com  Fact Sheet for Healthcare Providers: SeriousBroker.it  This test is not yet approved or cleared by the [place] FDA and has been authorized for detection and/or diagnosis of SARS-CoV-2 by FDA under an Emergency Use Authorization (EUA). This EUA will remain in effect (meaning this test can be used) for the duration of the COVID-19 declaration under Section 564(b)(1) of the Act, 21 U.S.C. section 360bbb-3(b)(1), unless the authorization is terminated or revoked.  Performed at [facility], [address], [place], [place] [address]          Radiology Studies: DG Chest [place] 1 View  Result Date: 08/27/2022 CLINICAL DATA:  Weakness for 1 month EXAM: PORTABLE CHEST 1 VIEW COMPARISON:  06/03/2022 FINDINGS: Cardiac shadow is mildly enlarged but stable. Aortic calcifications are seen. Lungs are clear. No acute bony abnormality is noted. IMPRESSION: No active disease. Electronically Signed   By: Alcide Clever M.D.   On: 08/27/2022 22:45        Scheduled Meds:  vitamin C  500 mg Oral Daily   cholecalciferol  1,000 Units Oral Daily   cyanocobalamin  1,000 mcg Oral Daily    enoxaparin (LOVENOX) injection  40 mg Subcutaneous Q24H   ferrous sulfate  325 mg Oral BID   guaiFENesin  600 mg Oral BID   latanoprost  1 drop Both Eyes QHS   metoprolol succinate  100 mg Oral Daily   molnupiravir EUA  4 capsule Oral BID   pantoprazole  40 mg Oral BID   senna  2 tablet Oral BID   zinc sulfate  220 mg Oral Daily   Continuous Infusions:  sodium chloride 75 mL/hr at 08/28/22 0846   cefTRIAXone (ROCEPHIN)  IV      Assessment & Plan:   Principal Problem:   COVID-19 virus infection Active Problems:   UTI (urinary tract infection)   Acute kidney injury superimposed on chronic kidney disease (HCC)   Essential hypertension   GERD with esophagitis   COVID-19 virus infection - The patient will be admitted to a medical telemetry bed. - This would explain her respiratory symptoms and generalized weakness. 8/30 continue p.o. Molnupiravir given AKI on ckd F/u inflammatory markers Continue vitamin C, D3, zinc PT/OT On RA     UTI (  urinary tract infection) Continue Rocephin Urine cx pending   Acute kidney injury superimposed on chronic kidney disease (HCC) - She has AKI superimposed on stage IIIa chronic kidney disease. 8/30 improving slowly Continue IV fluids, decrease rate to 50 MLS per hour    Essential hypertension Stable Hold diuretics and ARB's with AKI  GERD with esophagitis Continue PPI   DVT prophylaxis: Lovenox Code Status: Partial Family Communication: Family at bedside Disposition Plan:  Status is: Inpatient Remains inpatient appropriate because: IV treatment.  Needs PT OT        LOS: 1 day   Time spent: 35 minutes    Lynn Ito, MD [facility] Pager [phone]  If 7PM-7AM, please contact night-coverage 08/28/2022, 8:58 AM

## 2022-08-28 NOTE — Assessment & Plan Note (Signed)
-   She will be placed on IV Rocephin. - We will follow urine culture and sensitivity. 

## 2022-08-28 NOTE — Assessment & Plan Note (Signed)
-   She has AKI superimposed on stage IIIa chronic kidney disease. - She will be hydrated with IV normal saline and will follow BMP. - We will avoid nephrotoxins.

## 2022-08-28 NOTE — Assessment & Plan Note (Signed)
-   We will continue PPI therapy 

## 2022-08-28 NOTE — Assessment & Plan Note (Addendum)
-   We will continue her antihypertensives while holding off diuretic therapy and ARB given acute kidney injury.

## 2022-08-28 NOTE — Progress Notes (Signed)
PHARMACIST - PHYSICIAN COMMUNICATION  CONCERNING:  Enoxaparin (Lovenox) for DVT Prophylaxis    RECOMMENDATION: Patient was prescribed enoxaprin 40mg  q24 hours for VTE prophylaxis.   Filed Weights   08/27/22 2137  Weight: 93.9 kg (207 lb)    Body mass index is 35.53 kg/m.  Estimated Creatinine Clearance: 26.5 mL/min (A) (by C-G formula based on SCr of 1.38 mg/dL (H)).   Patient is candidate for enoxaparin 30mg  every 24 hours based on CrCl <38ml/min or Weight <45kg  DESCRIPTION: Pharmacy has adjusted enoxaparin dose per [facility] policy.  Patient is now receiving enoxaparin 30 mg every 24 hours    31m, PharmD Clinical Pharmacist  08/28/2022 10:56 AM

## 2022-08-29 DIAGNOSIS — U071 COVID-19: Secondary | ICD-10-CM | POA: Diagnosis not present

## 2022-08-29 LAB — CBC WITH DIFFERENTIAL/PLATELET
Abs Immature Granulocytes: 0.02 10*3/uL (ref 0.00–0.07)
Basophils Absolute: 0 10*3/uL (ref 0.0–0.1)
Basophils Relative: 0 %
Eosinophils Absolute: 0.4 10*3/uL (ref 0.0–0.5)
Eosinophils Relative: 5 %
HCT: 30.2 % — ABNORMAL LOW (ref 36.0–46.0)
Hemoglobin: 9.2 g/dL — ABNORMAL LOW (ref 12.0–15.0)
Immature Granulocytes: 0 %
Lymphocytes Relative: 36 %
Lymphs Abs: 2.4 10*3/uL (ref 0.7–4.0)
MCH: 27.3 pg (ref 26.0–34.0)
MCHC: 30.5 g/dL (ref 30.0–36.0)
MCV: 89.6 fL (ref 80.0–100.0)
Monocytes Absolute: 0.7 10*3/uL (ref 0.1–1.0)
Monocytes Relative: 10 %
Neutro Abs: 3.3 10*3/uL (ref 1.7–7.7)
Neutrophils Relative %: 49 %
Platelets: 198 10*3/uL (ref 150–400)
RBC: 3.37 MIL/uL — ABNORMAL LOW (ref 3.87–5.11)
RDW: 17.9 % — ABNORMAL HIGH (ref 11.5–15.5)
WBC: 6.8 10*3/uL (ref 4.0–10.5)
nRBC: 0 % (ref 0.0–0.2)

## 2022-08-29 LAB — CREATININE, SERUM
Creatinine, Ser: 1.12 mg/dL — ABNORMAL HIGH (ref 0.44–1.00)
GFR, Estimated: 45 mL/min — ABNORMAL LOW (ref >60)

## 2022-08-29 LAB — FERRITIN: Ferritin: 18 ng/mL (ref 11–307)

## 2022-08-29 LAB — D-DIMER, QUANTITATIVE: D-Dimer, Quant: 1.02 ug/mL-FEU — ABNORMAL HIGH (ref 0.00–0.50)

## 2022-08-29 LAB — C-REACTIVE PROTEIN: CRP: <0.5 mg/dL (ref <1.0)

## 2022-08-29 MED ORDER — CARBIDOPA-LEVODOPA 25-100 MG PO TABS
1.0000 | ORAL_TABLET | Freq: Every day | ORAL | Status: DC
  Administered 2022-08-29 – 2022-09-08 (×11): 1 via ORAL
  Filled 2022-08-29 (×11): qty 1

## 2022-08-29 MED ORDER — ENOXAPARIN SODIUM 40 MG/0.4ML IJ SOSY
40.0000 mg | PREFILLED_SYRINGE | INTRAMUSCULAR | Status: DC
  Administered 2022-08-29 – 2022-09-08 (×11): 40 mg via SUBCUTANEOUS
  Filled 2022-08-29 (×12): qty 0.4

## 2022-08-29 MED ORDER — LACTATED RINGERS IV BOLUS
250.0000 mL | Freq: Once | INTRAVENOUS | Status: AC
  Administered 2022-08-29: 250 mL via INTRAVENOUS

## 2022-08-29 MED ORDER — CLOPIDOGREL BISULFATE 75 MG PO TABS
75.0000 mg | ORAL_TABLET | Freq: Every day | ORAL | Status: DC
  Administered 2022-08-29 – 2022-09-09 (×12): 75 mg via ORAL
  Filled 2022-08-29 (×12): qty 1

## 2022-08-29 NOTE — Evaluation (Signed)
Occupational Therapy Evaluation Patient Details Name: Sheila Long MRN: [id number] DOB: November 27, 1924 Today's Date: 08/29/2022   History of Present Illness 88 y.o. female with medical history of CHF coronary artery disease, GERD, hypertension, osteoarthritis, peripheral vascular disease, anxiety and sarcoidosis, who presented to the emergency room with acute onset of worsening dyspnea with associated cough productive of clear sputum and occasional wheezing. COVID-19 PCR came back positive.   Clinical Impression   Sheila Long presents with generalized weakness, limited endurance, impaired balance, and dyspnea. Prior to this hospitalization, she has been living alone in a 1-story home, receiving close to 24-hour assistance from friends and neighbors and an aide who overnights at pt's home 7 nights/week. These friends and aides provide assistance with dressing, bathing, cooking, shopping, housework, transportation. Pt uses a RW for ambulation, reports 1 fall in previous 12 months, uses 2L O2 PRN. During today's evaluation, pt's SpO2 sats remain at 93% or > throughout session, on 2L. She denies pain. She is able to perform bed mobility, transfers, ambulation, dressing, requiring Min-Mod A with each. She reports this is below her baseline -- she is normally able to transfer supine<sit and sit<>stand w/o any physical assistance. She reports that she has required assistance for elevating LE during  sit<supine for ~ the previous 2 months. Prior to this summer, she was able to raise LE up to bed level INDly. Pt is somewhat weaker than her baseline level of fxl mobility, but is not far off it. She has good support and all required equipment at home. Recommend ongoing OT while hospitalized, with DC home with HHOT to allow pt to continue regaining strength, improving balance, and reducing falls risk.     Recommendations for follow up therapy are one component of a multi-disciplinary discharge planning  process, led by the attending physician.  Recommendations may be updated based on patient status, additional functional criteria and insurance authorization.   Follow Up Recommendations  Home health OT    Assistance Recommended at Discharge Frequent or constant Supervision/Assistance  Patient can return home with the following A little help with walking and/or transfers;A little help with bathing/dressing/bathroom;Assistance with cooking/housework;Assist for transportation    Functional Status Assessment  Patient has had a recent decline in their functional status and demonstrates the ability to make significant improvements in function in a reasonable and predictable amount of time.  Equipment Recommendations  None recommended by OT    Recommendations for Other Services       Precautions / Restrictions Precautions Precautions: Fall Restrictions Weight Bearing Restrictions: No      Mobility Bed Mobility Overal bed mobility: Needs Assistance Bed Mobility: Supine to Sit, Sit to Supine     Supine to sit: Min assist Sit to supine: Mod assist        Transfers Overall transfer level: Needs assistance Equipment used: Rolling walker (2 wheels) Transfers: Sit to/from Stand, Bed to chair/wheelchair/BSC Sit to Stand: Min assist     Step pivot transfers: Min assist     General transfer comment: Pt was slow to initiate standing, needed light assist to insure hips shifted up and forward but good use of UEs and ability to rise w/o direct assist.      Balance Overall balance assessment: Needs assistance Sitting-balance support: Bilateral upper extremity supported Sitting balance-Leahy Scale: Good     Standing balance support: Bilateral upper extremity supported Standing balance-Leahy Scale: Fair  ADL either performed or assessed with clinical judgement   ADL Overall ADL's : Needs assistance/impaired                 Upper  Body Dressing : Minimal assistance   Lower Body Dressing: Moderate assistance                       Vision         Perception     Praxis      Pertinent Vitals/Pain Pain Assessment Pain Assessment: No/denies pain     Hand Dominance Right   Extremity/Trunk Assessment Upper Extremity Assessment Upper Extremity Assessment: Generalized weakness   Lower Extremity Assessment Lower Extremity Assessment: Generalized weakness   Cervical / Trunk Assessment Cervical / Trunk Assessment: Normal   Communication Communication Communication: HOH   Cognition Arousal/Alertness: Awake/alert Behavior During Therapy: WFL for tasks assessed/performed Overall Cognitive Status: Within Functional Limits for tasks assessed                                 General Comments: Pt needing some cuing to stay on task, but able to hold appropriate conversation and follow basic instruction     General Comments       Exercises Other Exercises Other Exercises: Educ re: falls prevention, home modifications, DC recs   Shoulder Instructions      Home Living Family/patient expects to be discharged to:: Private residence Living Arrangements: Alone Available Help at Discharge: Neighbor;Personal care attendant;Available PRN/intermittently Type of Home: House Home Access: Stairs to enter [employer] of Steps: 3 Entrance Stairs-Rails: Right Home Layout: Multi-level;Laundry or work area in basement;Able to live on main level with bedroom/bathroom     Bathroom Shower/Tub: Producer, television/film/video: Administrator Accessibility: Yes   Home Equipment: Agricultural consultant (2 wheels);Cane - single point;Wheelchair - manual;BSC/3in1;Grab bars - toilet;Grab bars - tub/shower;Hand held shower head;Adaptive equipment Adaptive Equipment: Reacher;Sock aid;Long-handled shoe horn;Long-handled sponge        Prior Functioning/Environment Prior Level of Function : Needs  assist             Mobility Comments: rarely out of the home, primarily uses RW for ambulation.  Uses O2 PRN QD. ADLs Comments: Pt able to go to toilet INDly. Requires assistance for bed mobility, dressing, bathing and for housework, shopping, cooking, transporation -- neighbors and overnight aid assist with this.        OT Problem List: Decreased strength;Decreased activity tolerance;Impaired balance (sitting and/or standing);Cardiopulmonary status limiting activity      OT Treatment/Interventions: Self-care/ADL training;Therapeutic exercise;Patient/family education;Balance training;Energy conservation;Therapeutic activities;DME and/or AE instruction    OT Goals(Current goals can be found in the care plan section) Acute Rehab OT Goals Patient Stated Goal: to get home and be stronger OT Goal Formulation: With patient Time For Goal Achievement: 09/12/22 Potential to Achieve Goals: Good ADL Goals Pt Will Transfer to Toilet: with supervision;stand pivot transfer;regular height toilet Pt/caregiver will Perform Home Exercise Program: Increased ROM;Increased strength;With Supervision Additional ADL Goal #1: Pt will complete sit<>supine bed mobility with Mod I  OT Frequency: Min 2X/week    Co-evaluation              AM-PAC OT "6 Clicks" Daily Activity     Outcome Measure Help from another person eating meals?: None Help from another person taking care of personal grooming?: A Little Help from another person toileting, which includes using  toliet, bedpan, or urinal?: A Lot Help from another person bathing (including washing, rinsing, drying)?: A Lot Help from another person to put on and taking off regular upper body clothing?: A Little Help from another person to put on and taking off regular lower body clothing?: A Lot 6 Click Score: 16   End of Session Equipment Utilized During Treatment: Rolling walker (2 wheels);Oxygen  Activity Tolerance: Patient tolerated treatment  well Patient left: in chair;with call bell/phone within reach;with family/visitor present  OT Visit Diagnosis: Unsteadiness on feet (R26.81);Muscle weakness (generalized) (M62.81)                Time: 3903-0092 OT Time Calculation (min): 45 min Charges:  OT General Charges $OT Visit: 1 Visit OT Evaluation $OT Eval Low Complexity: 1 Low OT Treatments $Self Care/Home Management : 38-52 mins Latina Craver, PhD, MS, OTR/L 08/29/22, 2:47 PM

## 2022-08-29 NOTE — Progress Notes (Signed)
Notified at [phone] by charge nurse that patient is supposed to be on tele but is not. Patient has been in hospital since 8/29 without tele. Reported to oncoming nurse Annice Pih who contacted the day team to see if order can be discontinued.

## 2022-08-29 NOTE — Progress Notes (Signed)
Cross Cover Nurse reports no urine output this shift Bladder scan only reveals 378 ml 250 ml LR bolus ordered  Donnie Mesa NP [facility]

## 2022-08-29 NOTE — Progress Notes (Addendum)
MD Amery secure chat at [phone] during the shift report with night shift- to determine if telemetry is still required. Patient at hospital since 8/29 with telemetry order and has not been on telemetry. Situation IN PROGRESS.

## 2022-08-29 NOTE — Progress Notes (Signed)
PROGRESS NOTE    Sheila Long  IRJ:[id number] DOB: September 26, 1924 DOA: 08/27/2022 PCP: Enid Baas, MD    Brief Narrative:  Sheila Long is a 88 y.o. Caucasian female with medical history significant for CHF coronary artery disease, GERD, hypertension, osteoarthritis, peripheral vascular disease, anxiety and sarcoidosis, who presented to the emergency room with acute onset of worsening dyspnea with associated cough productive of clear sputum and occasional wheezing which have been going on for the last week.    COVID-19 PCR came back positive   8/31 no overnight issues  Consultants:    Procedures:   Antimicrobials:      Subjective: Denies sob, or cp  Objective: Vitals:   08/28/22 2037 08/29/22 0033 08/29/22 0413 08/29/22 0931  BP: (!) 130/51 114/62 (!) 148/82 (!) 146/70  Pulse: 72 68 70 72  Resp: 17 16 18 18   Temp: 97.7 F (36.5 C) 98.2 F (36.8 C) 98 F (36.7 C) 98.2 F (36.8 C)  TempSrc:   Oral Oral  SpO2: 99% 100% 99% 98%  Weight:      Height:        Intake/Output Summary (Last 24 hours) at 08/29/2022 1416 Last data filed at 08/29/2022 08/31/2022 Gross per 24 hour  Intake 1347.8 ml  Output 300 ml  Net 1047.8 ml   Filed Weights   08/27/22 2137  Weight: 93.9 kg    Examination:  Calm, NAD Decrease bs , no wheezing Reg s1/s2 no gallop Soft benign +bs No edema Awake and alert Mood and affect appropriate in current setting   Data Reviewed: I have personally reviewed following labs and imaging studies  CBC: Recent Labs  Lab 08/27/22 2207 08/28/22 0316 08/29/22 0526  WBC 7.1 7.0 6.8  NEUTROABS  --   --  3.3  HGB 10.4* 9.8* 9.2*  HCT 33.7* 32.1* 30.2*  MCV 90.6 90.2 89.6  PLT 221 215 198   Basic Metabolic Panel: Recent Labs  Lab 08/27/22 2207 08/28/22 0316 08/29/22 0526  NA 139 140  --   K 4.6 3.9  --   CL 107 107  --   CO2 25 27  --   GLUCOSE 121* 143*  --   BUN 19 18  --   CREATININE 1.48* 1.38* 1.12*  CALCIUM 9.0  8.5*  --    GFR: Estimated Creatinine Clearance: 32.7 mL/min (A) (by C-G formula based on SCr of 1.12 mg/dL (H)). Liver Function Tests: No results for input(s): "AST", "ALT", "ALKPHOS", "BILITOT", "PROT", "ALBUMIN" in the last 168 hours. No results for input(s): "LIPASE", "AMYLASE" in the last 168 hours. No results for input(s): "AMMONIA" in the last 168 hours. Coagulation Profile: No results for input(s): "INR", "PROTIME" in the last 168 hours. Cardiac Enzymes: No results for input(s): "CKTOTAL", "CKMB", "CKMBINDEX", "TROPONINI" in the last 168 hours. BNP (last 3 results) No results for input(s): "PROBNP" in the last 8760 hours. HbA1C: No results for input(s): "HGBA1C" in the last 72 hours. CBG: No results for input(s): "GLUCAP" in the last 168 hours. Lipid Profile: No results for input(s): "CHOL", "HDL", "LDLCALC", "TRIG", "CHOLHDL", "LDLDIRECT" in the last 72 hours. Thyroid Function Tests: Recent Labs    08/27/22 2207  TSH 2.922   Anemia Panel: Recent Labs    08/28/22 0316 08/29/22 0526  FERRITIN 16 18   Sepsis Labs: Recent Labs  Lab 08/28/22 0316  PROCALCITON <0.10    Recent Results (from the past 240 hour(s))  Resp Panel by RT-PCR (Flu A&B, Covid) Anterior Nasal Swab  Status: Abnormal   Collection Time: 08/27/22 10:06 PM   Specimen: Anterior Nasal Swab  Result Value Ref Range Status   SARS Coronavirus 2 by RT PCR POSITIVE (A) NEGATIVE Final    Comment: (NOTE) SARS-CoV-2 target nucleic acids are DETECTED.  The SARS-CoV-2 RNA is generally detectable in upper respiratory specimens during the acute phase of infection. Positive results are indicative of the presence of the identified virus, but do not rule out bacterial infection or co-infection with other pathogens not detected by the test. Clinical correlation with patient history and other diagnostic information is necessary to determine patient infection status. The expected result is Negative.  Fact  Sheet for Patients: EntrepreneurPulse.com.au  Fact Sheet for Healthcare Providers: IncredibleEmployment.be  This test is not yet approved or cleared by the [place] FDA and  has been authorized for detection and/or diagnosis of SARS-CoV-2 by FDA under an Emergency Use Authorization (EUA).  This EUA will remain in effect (meaning this test can be used) for the duration of  the COVID-19 declaration under Section 564(b)(1) of the A ct, 21 U.S.C. section 360bbb-3(b)(1), unless the authorization is terminated or revoked sooner.     Influenza A by PCR NEGATIVE NEGATIVE Final   Influenza B by PCR NEGATIVE NEGATIVE Final    Comment: (NOTE) The Xpert Xpress SARS-CoV-2/FLU/RSV plus assay is intended as an aid in the diagnosis of influenza from Nasopharyngeal swab specimens and should not be used as a sole basis for treatment. Nasal washings and aspirates are unacceptable for Xpert Xpress SARS-CoV-2/FLU/RSV testing.  Fact Sheet for Patients: EntrepreneurPulse.com.au  Fact Sheet for Healthcare Providers: IncredibleEmployment.be  This test is not yet approved or cleared by the [place] FDA and has been authorized for detection and/or diagnosis of SARS-CoV-2 by FDA under an Emergency Use Authorization (EUA). This EUA will remain in effect (meaning this test can be used) for the duration of the COVID-19 declaration under Section 564(b)(1) of the Act, 21 U.S.C. section 360bbb-3(b)(1), unless the authorization is terminated or revoked.  Performed at [facility], [address], [place], [place] [address]          Radiology Studies: DG Chest [place] 1 View  Result Date: 08/27/2022 CLINICAL DATA:  Weakness for 1 month EXAM: PORTABLE CHEST 1 VIEW COMPARISON:  06/03/2022 FINDINGS: Cardiac shadow is mildly enlarged but stable. Aortic calcifications are seen. Lungs are clear. No acute bony abnormality  is noted. IMPRESSION: No active disease. Electronically Signed   By: Inez Catalina M.D.   On: 08/27/2022 22:45        Scheduled Meds:  vitamin C  500 mg Oral Daily   cholecalciferol  1,000 Units Oral Daily   cyanocobalamin  1,000 mcg Oral Daily   enoxaparin (LOVENOX) injection  30 mg Subcutaneous Q24H   ferrous sulfate  325 mg Oral BID   guaiFENesin  600 mg Oral BID   latanoprost  1 drop Both Eyes QHS   metoprolol succinate  100 mg Oral Daily   molnupiravir EUA  4 capsule Oral BID   pantoprazole  40 mg Oral BID   senna  2 tablet Oral BID   zinc sulfate  220 mg Oral Daily   Continuous Infusions:  cefTRIAXone (ROCEPHIN)  IV 1 g (08/28/22 2201)    Assessment & Plan:   Principal Problem:   COVID-19 virus infection Active Problems:   UTI (urinary tract infection)   Acute kidney injury superimposed on chronic kidney disease ([place])   Essential hypertension   GERD with  esophagitis   COVID-19 virus infection - The patient will be admitted to a medical telemetry bed. - This would explain her respiratory symptoms and generalized weakness. 8/30 continue p.o. Molnupiravir given AKI on ckd F/u inflammatory markers Continue vitamin C, D3, zinc PT/OT 8/31 continue current mx     UTI (urinary tract infection) Continue rocephin, ucx pending    Acute kidney injury superimposed on chronic kidney disease (HCC) - She has AKI superimposed on stage IIIa chronic kidney disease. 8/31 improving slowly Dc ivf since at baseline.    Essential hypertension Hold diuretics and ARB's with AKI 8/31 stable  GERD with esophagitis Continue PPI   DVT prophylaxis: Lovenox Code Status: Partial Family Communication: None at bedside Disposition Plan: Home with home health Status is: Inpatient Remains inpatient appropriate because: IV treatment.         LOS: 2 days   Time spent: 35 minutes    Lynn Ito, MD [facility] Pager [phone]  If 7PM-7AM, please contact  night-coverage 08/29/2022, 2:16 PM

## 2022-08-29 NOTE — Evaluation (Signed)
Physical Therapy Evaluation Patient Details Name: Sheila Long MRN: [id number] DOB: [birth year] Today's Date: 08/29/2022  History of Present Illness  88 y.o. female with medical history of CHF coronary artery disease, GERD, hypertension, osteoarthritis, peripheral vascular disease, anxiety and sarcoidosis, who presented to the emergency room with acute onset of worsening dyspnea with associated cough productive of clear sputum and occasional wheezing.  Clinical Impression  Pt did well with PT exam. Initially she did not feel that she could do more than attempt getting to the recliner, however that went well and she agreed to stand again and do some walking, ultimately went ~30 ft with slow but safe gait.  Pt on 2L on arrival with SpO2 in the high 90s, maintained mid 90s on room air with supine/sitting, sats dropped to low 90s with the ambulation effort on room air.  Pt endorses feeling weaker than her baseline, but was pleased to get to the recliner and with how much she was able to do.       Recommendations for follow up therapy are one component of a multi-disciplinary discharge planning process, led by the attending physician.  Recommendations may be updated based on patient status, additional functional criteria and insurance authorization.  Follow Up Recommendations Home health PT      Assistance Recommended at Discharge Frequent or constant Supervision/Assistance  Patient can return home with the following  A little help with walking and/or transfers;A little help with bathing/dressing/bathroom;Assistance with cooking/housework;Assist for transportation;Help with stairs or ramp for entrance    Equipment Recommendations None recommended by PT  Recommendations for Other Services       Functional Status Assessment Patient has had a recent decline in their functional status and demonstrates the ability to make significant improvements in function in a reasonable and predictable  amount of time.     Precautions / Restrictions Precautions Precautions: Fall Restrictions Weight Bearing Restrictions: No      Mobility  Bed Mobility Overal bed mobility: Needs Assistance Bed Mobility: Supine to Sit     Supine to sit: Min assist     General bed mobility comments: Pt reports her grab bars at home are better positioned and that she does this on her own.  Needed some light assist today to get to sitting EOB.    Transfers Overall transfer level: Needs assistance Equipment used: Rolling walker (2 wheels) Transfers: Sit to/from Stand Sit to Stand: Min assist           General transfer comment: Pt was slow to initiate standing, needed light assist to insure hips shifted up and forward but good use of UEs and ability to rise w/o direct assist.    Ambulation/Gait Ambulation/Gait assistance: Min assist Gait Distance (Feet): 30 Feet Assistive device: Rolling walker (2 wheels)         General Gait Details: Pt able to do slow but steady bout of ambulation in the room reliant on walker but with no LOBs or overt safety issues.  Pt on room air with sats dropping from high 90s to low 90s during the effort.  Stairs            Wheelchair Mobility    Modified Rankin (Stroke Patients Only)       Balance Overall balance assessment: Needs assistance Sitting-balance support: Bilateral upper extremity supported Sitting balance-Leahy Scale: Good     Standing balance support: Bilateral upper extremity supported Standing balance-Leahy Scale: Fair  Pertinent Vitals/Pain Pain Assessment Pain Assessment: Faces Faces Pain Scale: No hurt    Home Living Family/patient expects to be discharged to:: Private residence Living Arrangements: Alone Available Help at Discharge: Neighbor;Available 24 hours/day (nenighbor helps with errands, etc, reports 24/7 assist between paid help and friends, family) Type of Home:  House Home Access: Stairs to enter Entrance Stairs-Rails: Right Entrance Stairs-Number of Steps: 2   Home Layout: Multi-level;Laundry or work area in basement;Able to live on main level with bedroom/bathroom Home Equipment: Agricultural consultant (2 wheels);Cane - single point;Wheelchair - manual;BSC/3in1;Grab bars - toilet;Grab bars - tub/shower      Prior Function Prior Level of Function : Independent/Modified Independent             Mobility Comments: rarely out of the home, will use SPC or walker depending on the day.  Uses O2 PRN QD.       Hand Dominance        Extremity/Trunk Assessment   Upper Extremity Assessment Upper Extremity Assessment: Generalized weakness (age appropriate limitations)    Lower Extremity Assessment Lower Extremity Assessment: Generalized weakness (age appropriate limitations)       Communication   Communication: HOH  Cognition Arousal/Alertness: Awake/alert Behavior During Therapy: WFL for tasks assessed/performed Overall Cognitive Status: Within Functional Limits for tasks assessed                                 General Comments: Pt needing some cuing to stay on task, but able to hold appropriate conversation and follow basic instruction        General Comments      Exercises     Assessment/Plan    PT Assessment Patient needs continued PT services  PT Problem List Decreased strength;Decreased range of motion;Decreased activity tolerance;Decreased balance;Decreased mobility;Cardiopulmonary status limiting activity       PT Treatment Interventions Gait training;Balance training;Therapeutic exercise;Therapeutic activities;Functional mobility training;DME instruction;Stair training;Patient/family education    PT Goals (Current goals can be found in the Care Plan section)  Acute Rehab PT Goals Patient Stated Goal: go home PT Goal Formulation: With patient Time For Goal Achievement: 09/11/22 Potential to Achieve Goals:  Good    Frequency Min 2X/week     Co-evaluation               AM-PAC PT "6 Clicks" Mobility  Outcome Measure Help needed turning from your back to your side while in a flat bed without using bedrails?: A Little Help needed moving from lying on your back to sitting on the side of a flat bed without using bedrails?: A Little Help needed moving to and from a bed to a chair (including a wheelchair)?: A Little Help needed standing up from a chair using your arms (e.g., wheelchair or bedside chair)?: A Little Help needed to walk in hospital room?: A Little Help needed climbing 3-5 steps with a railing? : A Lot 6 Click Score: 17    End of Session Equipment Utilized During Treatment: Gait belt Activity Tolerance: Patient tolerated treatment well Patient left: with chair alarm set;with call bell/phone within reach Nurse Communication: Mobility status PT Visit Diagnosis: Muscle weakness (generalized) (M62.81);Difficulty in walking, not elsewhere classified (R26.2)    Time: 1050-1130 PT Time Calculation (min) (ACUTE ONLY): 40 min   Charges:   PT Evaluation $PT Eval Low Complexity: 1 Low PT Treatments $Gait Training: 8-22 mins        Malachi Pro, DPT  08/29/2022, 2:02 PM

## 2022-08-29 NOTE — Progress Notes (Signed)
PHARMACIST - PHYSICIAN COMMUNICATION  CONCERNING:  Enoxaparin (Lovenox) for DVT Prophylaxis    RECOMMENDATION: Patient was prescribed enoxaprin 30mg  q24 hours for VTE prophylaxis.   Filed Weights   08/27/22 2137  Weight: 93.9 kg (207 lb)    Body mass index is 35.53 kg/m.  Estimated Creatinine Clearance: 32.7 mL/min (A) (by C-G formula based on SCr of 1.12 mg/dL (H)).   Patient is candidate for enoxaparin 40mg  every 24 hours based on CrCl > 43ml/min and Weight > 45 kg  DESCRIPTION: Pharmacy has adjusted enoxaparin dose per [facility] policy.  Patient is now receiving enoxaparin 40 mg every 24 hours    31m, PharmD Clinical Pharmacist  08/29/2022 4:19 PM

## 2022-08-30 DIAGNOSIS — U071 COVID-19: Secondary | ICD-10-CM | POA: Diagnosis not present

## 2022-08-30 LAB — D-DIMER, QUANTITATIVE: D-Dimer, Quant: 0.87 ug/mL-FEU — ABNORMAL HIGH (ref 0.00–0.50)

## 2022-08-30 LAB — C-REACTIVE PROTEIN: CRP: <0.5 mg/dL (ref <1.0)

## 2022-08-30 MED ORDER — GUAIFENESIN ER 600 MG PO TB12: 600.0000 mg | ORAL_TABLET | Freq: Two times a day (BID) | ORAL | 0 refills | Status: AC

## 2022-08-30 MED ORDER — VITAMIN D3 25 MCG PO TABS: 1000.0000 [IU] | ORAL_TABLET | Freq: Every day | ORAL | 0 refills | Status: AC

## 2022-08-30 MED ORDER — ZINC SULFATE 220 (50 ZN) MG PO CAPS: 220.0000 mg | ORAL_CAPSULE | Freq: Every day | ORAL | 0 refills | Status: DC

## 2022-08-30 MED ORDER — FUROSEMIDE 20 MG PO TABS: 20.0000 mg | ORAL_TABLET | ORAL | Status: DC

## 2022-08-30 MED ORDER — ASCORBIC ACID 500 MG PO TABS: 500.0000 mg | ORAL_TABLET | Freq: Every day | ORAL | 0 refills | Status: DC

## 2022-08-30 MED ORDER — MOLNUPIRAVIR EUA 200MG CAPSULE: 4.0000 | ORAL_CAPSULE | Freq: Two times a day (BID) | ORAL | 0 refills | Status: DC

## 2022-08-30 NOTE — Care Management Important Message (Signed)
Important Message  Patient Details  Name: Sheila Long MRN: [id number] Date of Birth: [birth year]   Medicare Important Message Given:  Yes  Patient is in an isolation room so I called her room [phone]) but she said she didn't have her hearing aide and asked me to call her sister, Malena Catholic and review it with her. I called her sister and reviewed the Important Message from Medicare with her [phone]) and she said they are in agreement with the discharge as she had just talked with the CSW. I thanked her for time.   Olegario Messier A Benjimen Kelley 08/30/2022, 10:48 AM

## 2022-08-30 NOTE — Progress Notes (Signed)
[facility]) hospitalized hospice patient visit [name] is a current [facility] hospice patient with a terminal diagnosis of Hypertensive Heart Disease with CKD and HF. Patient presented to the hospital on 8.29.23 with complaints of weakness, cough and congestion. She was admitted on 8.29.23 with a diagnosis of Covid Infection. [facility] was not notified until 9.1.23 when patient was ready for discharge. Per Dr. Anne Fu with [facility] this is a related hospital admission.  Sheila Long is ready for discharge at this time and is awaiting transport. She was treated intravenously for UTI as well as Covid 19 infection while in the hospital. Patient was inpatient appropriate due to the need for IVAB and IV fluids.  Vital Signs-  97.5/65/17   131/51   spO2 96% Intake/Output- 1347/ not documented Abnormal labs- Glucose 143, Creatinine 1.12, Ca+ 8.5, GFR 45, Hgb 9.2, Hct 30.2, D-Dimer 0.87, Covid +, Large UTI Diagnostics-   PORTABLE CHEST 1 VIEW  COMPARISON:  06/03/2022  FINDINGS: Cardiac shadow is mildly enlarged but stable. Aortic calcifications are seen. Lungs are clear. No acute bony abnormality is noted.  IMPRESSION: No active disease  Electronically Signed   By: Alcide Clever M.D.   On: 08/27/2022 22:45   IV/PRN Meds- NS 50cc/H, Rocephin 1 gram/24H, LR 250cc x2, Tussionex 39ml x1, Combivent 1 puff x2, Zofran ODT x1, Trazodone 25mg  x1 Problem List COVID-19 virus infection Was placed on Molnupiravir given AKI on CKD. Continue vitamin C, D3, zinc PT/OT-rec. HH On Room air. D dimer trending down Ferritin and CRP nml. Procalcitonin <0/10    UTI (urinary tract infection) Was treated with Rocephin. No urine cx was available.   Acute kidney injury superimposed on chronic kidney disease (HCC) - She has AKI superimposed on stage IIIa chronic kidney disease. Improved with gentle hydration.  Held her lasix , on discharge decreased dose as below Hold ARB until f/u with  pcp   Discharge Planning- Plan to d/c today by EMS transport Family Contact- Talked with family/neighbor at bedside IDT- Updated Goals of care - Clear, patient wanted treatment and then to return home with hospice services , RN, BSN, [facility] Liaison [phone]

## 2022-08-30 NOTE — TOC Progression Note (Addendum)
Transition of Care [facility]) - Progression Note    Patient Details  Name: Johann Gascoigne MRN: [id number] Date of Birth: [birth year]  Transition of Care [facility]) CM/SW Contact  Carley Hammed, [place] Phone Number: 08/30/2022, 12:07 PM  Clinical Narrative:    Update 2: CSW notified pt and family are refusing EMS transport and do not want to discharge now. CSW spoke with sister who is again stating she wants pt to go to a "facility for 20 days". CSW explained the barriers again that pt is hospice and would have to pay for LTC out of pocket. Hospice was sending someone out this evening to assess her, but pt has refused to have anyone else in the home. Sister stating she would like to appeal the discharge, and information given and advised on process. CSW followed back up with Misty at [facility] who states they have tried to put supports in place, but they have all been refused. [facility] to follow up with pt and family tomorrow to create a plan moving forward. TOC will continue to follow for DC planning.  Update: CSW updated that pt is Hospice. Hospice is currently providing therapies and assistance. They have offered many resources, but apparently pt is very resistant to people coming into the house. Hospice and CSW reviewed chart and noted that even if pt rescinded hospice care, she would be a poor candidate for SNF. Hospice will set supports in place and follow pt at home and work on placement if pt is still requiring it. CSW canceled [facility] as Insurance will not pay for two therapy companies. Social work is working with pt at home and provides resources for extra care, however they tend to refuse. TOC will defer to hospice care at this time. Pt to be transported home via EMS at families request.  CSW noted PT rec is HH. Pt from home with assistance from sister and neighbor. [facility] set up for PT/OT. Additional services being requested from Health team. Neighbor to transport pt, sister notified.         Expected Discharge Plan and Services           Expected Discharge Date: 08/30/22                                     Social Determinants of Health (SDOH) Interventions    Readmission Risk Interventions     No data to display

## 2022-08-30 NOTE — Discharge Summary (Signed)
Sheila Long HYQ:[id number] DOB: 04/11/1924 DOA: 08/27/2022  PCP: Enid Baas, MD  Admit date: 08/27/2022 Discharge date: 08/30/2022  Admitted From: home Disposition:  home  Recommendations for Outpatient Follow-up:  Follow up with PCP in 1 week Please obtain BMP/CBC in one week  Home Health:yes    Discharge Condition:Stable CODE STATUS:Partial  Diet recommendation: Heart Healthy Brief/Interim Summary: Per HPI: coronary artery disease, GERD, hypertension, osteoarthritis, peripheral vascular disease, anxiety and sarcoidosis, who presented to the emergency room with acute onset of worsening dyspnea with associated cough productive of clear sputum and occasional wheezing which have been going on for the last week.  COVID-19 PCR came back positive. Was also found with AKI. Patient feels better. PT/OT recommended home health.     COVID-19 virus infection Was placed on Molnupiravir given AKI on CKD. Continue vitamin C, D3, zinc PT/OT-rec. HH On Room air. D dimer trending down Ferritin and CRP nml. Procalcitonin <0/10       UTI (urinary tract infection) Was treated with Rocephin. No urine cx was available.   Acute kidney injury superimposed on chronic kidney disease (HCC) - She has AKI superimposed on stage IIIa chronic kidney disease. Improved with gentle hydration.  Held her lasix , on discharge decreased dose as below Hold ARB until f/u with pcp       Essential hypertension Stable Hold  ARB's with AKI, f/u with pcp   GERD with esophagitis Continue PPI       Discharge Diagnoses:  Principal Problem:   COVID-19 virus infection Active Problems:   UTI (urinary tract infection)   Acute kidney injury superimposed on chronic kidney disease (HCC)   Essential hypertension   GERD with esophagitis    Discharge Instructions  Discharge Instructions     Diet - low sodium heart healthy   Complete by: As directed    Discharge instructions   Complete  by: As directed    Need to quarantine for total 10 days  Take your first covid antibiotic , Molnupiravir, medication tonight at 22:00   Increase activity slowly   Complete by: As directed       Allergies as of 08/30/2022       Reactions   Tramadol Other (See Comments)   CONFUSION   Sulfa Antibiotics    Patient does not recall   Zocor [simvastatin] Other (See Comments)   INTOLERANCE-MYALGIAS        Medication List     STOP taking these medications    olmesartan-hydrochlorothiazide 40-12.5 MG tablet Commonly known as: BENICAR HCT   ondansetron 4 MG disintegrating tablet Commonly known as: ZOFRAN-ODT   prochlorperazine 10 MG tablet Commonly known as: COMPAZINE   spironolactone 25 MG tablet Commonly known as: ALDACTONE       TAKE these medications    ascorbic acid 500 MG tablet Commonly known as: VITAMIN C Take 1 tablet (500 mg total) by mouth daily. Start taking on: August 31, 2022   carbidopa-levodopa 25-100 MG tablet Commonly known as: SINEMET IR Take 1 tablet by mouth at bedtime.   clopidogrel 75 MG tablet Commonly known as: PLAVIX Take 75 mg by mouth daily.   cyanocobalamin 1000 MCG tablet Take 1 tablet (1,000 mcg total) by mouth daily.   ferrous sulfate 325 (65 FE) MG EC tablet Take 1 tablet (325 mg total) by mouth 2 (two) times daily.   furosemide 20 MG tablet Commonly known as: LASIX Take 1 tablet (20 mg total) by mouth every other day. What changed:  medication strength  how much to take when to take this   guaiFENesin 600 MG 12 hr tablet Commonly known as: MUCINEX Take 1 tablet (600 mg total) by mouth 2 (two) times daily for 7 days.   haloperidol 5 MG tablet Commonly known as: HALDOL Take 1 tablet (5 mg total) by mouth every 4 (four) hours as needed for up to 3 days for agitation. May crush, mix with water and give sublingually if needed.   ipratropium-albuterol 0.5-2.5 (3) MG/3ML Soln Commonly known as: DUONEB Take 3 mLs by  nebulization every 4 (four) hours as needed (shortness of breath.).   latanoprost 0.005 % ophthalmic solution Commonly known as: XALATAN Place 1 drop into both eyes at bedtime.   metoprolol succinate 100 MG 24 hr tablet Commonly known as: TOPROL-XL Take 1 tablet (100 mg total) by mouth daily. Take with or immediately following a meal.   molnupiravir EUA 200 mg Caps capsule Commonly known as: LAGEVRIO Take 4 capsules (800 mg total) by mouth 2 (two) times daily for 3 days.   oxyCODONE 5 MG immediate release tablet Commonly known as: Oxy IR/ROXICODONE Take 5 mg by mouth every 4 (four) hours as needed.   pantoprazole 20 MG tablet Commonly known as: PROTONIX Take 1 tablet by mouth daily. What changed: Another medication with the same name was removed. Continue taking this medication, and follow the directions you see here.   senna 8.6 MG tablet Commonly known as: Senokot Take 2 tablets (17.2 mg total) by mouth 2 (two) times daily. May crush, mix with water and give sublingually if needed.   vitamin D3 25 MCG tablet Commonly known as: CHOLECALCIFEROL Take 1 tablet (1,000 Units total) by mouth daily. Start taking on: August 31, 2022   zinc sulfate 220 (50 Zn) MG capsule Take 1 capsule (220 mg total) by mouth daily. Start taking on: August 31, 2022        Allergies  Allergen Reactions   Tramadol Other (See Comments)    CONFUSION   Sulfa Antibiotics     Patient does not recall   Zocor [Simvastatin] Other (See Comments)    INTOLERANCE-MYALGIAS     Consultations:    Procedures/Studies: DG Chest Port 1 View  Result Date: 08/27/2022 CLINICAL DATA:  Weakness for 1 month EXAM: PORTABLE CHEST 1 VIEW COMPARISON:  06/03/2022 FINDINGS: Cardiac shadow is mildly enlarged but stable. Aortic calcifications are seen. Lungs are clear. No acute bony abnormality is noted. IMPRESSION: No active disease. Electronically Signed   By: Alcide Clever M.D.   On: 08/27/2022 22:45       Subjective: No sob. On RA with ambulation per nsg >92%.   Discharge Exam: Vitals:   08/30/22 0811 08/30/22 0936  BP: (!) 131/51   Pulse: 64 78  Resp: 17   Temp: (!) 97.5 F (36.4 C)   SpO2: 100% 96%   Vitals:   08/29/22 2136 08/30/22 0611 08/30/22 0811 08/30/22 0936  BP: (!) 132/57 (!) 145/65 (!) 131/51   Pulse: 77 63 64 78  Resp: 19 16 17    Temp: 98.3 F (36.8 C) 98 F (36.7 C) (!) 97.5 F (36.4 C)   TempSrc:      SpO2: 99% 99% 100% 96%  Weight:      Height:        General: Pt is alert, awake, not in acute distress Cardiovascular: RRR, S1/S2 +, no rubs, no gallops Respiratory: CTA bilaterally, no wheezing, no rhonchi Abdominal: Soft, NT, ND, bowel sounds + Extremities: trace edema  The results of significant diagnostics from this hospitalization (including imaging, microbiology, ancillary and laboratory) are listed below for reference.     Microbiology: Recent Results (from the past 240 hour(s))  Resp Panel by RT-PCR (Flu A&B, Covid) Anterior Nasal Swab     Status: Abnormal   Collection Time: 08/27/22 10:06 PM   Specimen: Anterior Nasal Swab  Result Value Ref Range Status   SARS Coronavirus 2 by RT PCR POSITIVE (A) NEGATIVE Final    Comment: (NOTE) SARS-CoV-2 target nucleic acids are DETECTED.  The SARS-CoV-2 RNA is generally detectable in upper respiratory specimens during the acute phase of infection. Positive results are indicative of the presence of the identified virus, but do not rule out bacterial infection or co-infection with other pathogens not detected by the test. Clinical correlation with patient history and other diagnostic information is necessary to determine patient infection status. The expected result is Negative.  Fact Sheet for Patients: EntrepreneurPulse.com.au  Fact Sheet for Healthcare Providers: IncredibleEmployment.be  This test is not yet approved or cleared by the [place] FDA  and  has been authorized for detection and/or diagnosis of SARS-CoV-2 by FDA under an Emergency Use Authorization (EUA).  This EUA will remain in effect (meaning this test can be used) for the duration of  the COVID-19 declaration under Section 564(b)(1) of the A ct, 21 U.S.C. section 360bbb-3(b)(1), unless the authorization is terminated or revoked sooner.     Influenza A by PCR NEGATIVE NEGATIVE Final   Influenza B by PCR NEGATIVE NEGATIVE Final    Comment: (NOTE) The Xpert Xpress SARS-CoV-2/FLU/RSV plus assay is intended as an aid in the diagnosis of influenza from Nasopharyngeal swab specimens and should not be used as a sole basis for treatment. Nasal washings and aspirates are unacceptable for Xpert Xpress SARS-CoV-2/FLU/RSV testing.  Fact Sheet for Patients: EntrepreneurPulse.com.au  Fact Sheet for Healthcare Providers: IncredibleEmployment.be  This test is not yet approved or cleared by the [place] FDA and has been authorized for detection and/or diagnosis of SARS-CoV-2 by FDA under an Emergency Use Authorization (EUA). This EUA will remain in effect (meaning this test can be used) for the duration of the COVID-19 declaration under Section 564(b)(1) of the Act, 21 U.S.C. section 360bbb-3(b)(1), unless the authorization is terminated or revoked.  Performed at [facility], [address] [place], [place] [address]      Labs: BNP (last 3 results) Recent Labs    04/21/22 1832 06/01/22 1105 08/27/22 2207  BNP 385.5* 318.1* [id number]   Basic Metabolic Panel: Recent Labs  Lab 08/27/22 2207 08/28/22 0316 08/29/22 0526  NA 139 140  --   K 4.6 3.9  --   CL 107 107  --   CO2 25 27  --   GLUCOSE 121* 143*  --   BUN 19 18  --   CREATININE 1.48* 1.38* 1.12*  CALCIUM 9.0 8.5*  --    Liver Function Tests: No results for input(s): "AST", "ALT", "ALKPHOS", "BILITOT", "PROT", "ALBUMIN" in the last 168 hours. No  results for input(s): "LIPASE", "AMYLASE" in the last 168 hours. No results for input(s): "AMMONIA" in the last 168 hours. CBC: Recent Labs  Lab 08/27/22 2207 08/28/22 0316 08/29/22 0526  WBC 7.1 7.0 6.8  NEUTROABS  --   --  3.3  HGB 10.4* 9.8* 9.2*  HCT 33.7* 32.1* 30.2*  MCV 90.6 90.2 89.6  PLT 221 215 198   Cardiac Enzymes: No results for input(s): "CKTOTAL", "CKMB", "CKMBINDEX", "TROPONINI" in the  last 168 hours. BNP: Invalid input(s): "POCBNP" CBG: No results for input(s): "GLUCAP" in the last 168 hours. D-Dimer Recent Labs    08/29/22 0526 08/30/22 0612  DDIMER 1.02* 0.87*   Hgb A1c No results for input(s): "HGBA1C" in the last 72 hours. Lipid Profile No results for input(s): "CHOL", "HDL", "LDLCALC", "TRIG", "CHOLHDL", "LDLDIRECT" in the last 72 hours. Thyroid function studies Recent Labs    08/27/22 2207  TSH 2.922   Anemia work up Recent Labs    08/28/22 0316 08/29/22 0526  FERRITIN 16 18   Urinalysis    Component Value Date/Time   COLORURINE AMBER (A) 08/27/2022 2206   APPEARANCEUR TURBID (A) 08/27/2022 2206   LABSPEC 1.029 08/27/2022 2206   PHURINE 5.0 08/27/2022 2206   GLUCOSEU NEGATIVE 08/27/2022 2206   HGBUR MODERATE (A) 08/27/2022 2206   BILIRUBINUR NEGATIVE 08/27/2022 [employer] 08/27/2022 2206   PROTEINUR 30 (A) 08/27/2022 2206   NITRITE NEGATIVE 08/27/2022 2206   LEUKOCYTESUR LARGE (A) 08/27/2022 2206   Sepsis Labs Recent Labs  Lab 08/27/22 2207 08/28/22 0316 08/29/22 0526  WBC 7.1 7.0 6.8   Microbiology Recent Results (from the past 240 hour(s))  Resp Panel by RT-PCR (Flu A&B, Covid) Anterior Nasal Swab     Status: Abnormal   Collection Time: 08/27/22 10:06 PM   Specimen: Anterior Nasal Swab  Result Value Ref Range Status   SARS Coronavirus 2 by RT PCR POSITIVE (A) NEGATIVE Final    Comment: (NOTE) SARS-CoV-2 target nucleic acids are DETECTED.  The SARS-CoV-2 RNA is generally detectable in upper  respiratory specimens during the acute phase of infection. Positive results are indicative of the presence of the identified virus, but do not rule out bacterial infection or co-infection with other pathogens not detected by the test. Clinical correlation with patient history and other diagnostic information is necessary to determine patient infection status. The expected result is Negative.  Fact Sheet for Patients: EntrepreneurPulse.com.au  Fact Sheet for Healthcare Providers: IncredibleEmployment.be  This test is not yet approved or cleared by the [place] FDA and  has been authorized for detection and/or diagnosis of SARS-CoV-2 by FDA under an Emergency Use Authorization (EUA).  This EUA will remain in effect (meaning this test can be used) for the duration of  the COVID-19 declaration under Section 564(b)(1) of the A ct, 21 U.S.C. section 360bbb-3(b)(1), unless the authorization is terminated or revoked sooner.     Influenza A by PCR NEGATIVE NEGATIVE Final   Influenza B by PCR NEGATIVE NEGATIVE Final    Comment: (NOTE) The Xpert Xpress SARS-CoV-2/FLU/RSV plus assay is intended as an aid in the diagnosis of influenza from Nasopharyngeal swab specimens and should not be used as a sole basis for treatment. Nasal washings and aspirates are unacceptable for Xpert Xpress SARS-CoV-2/FLU/RSV testing.  Fact Sheet for Patients: EntrepreneurPulse.com.au  Fact Sheet for Healthcare Providers: IncredibleEmployment.be  This test is not yet approved or cleared by the [place] FDA and has been authorized for detection and/or diagnosis of SARS-CoV-2 by FDA under an Emergency Use Authorization (EUA). This EUA will remain in effect (meaning this test can be used) for the duration of the COVID-19 declaration under Section 564(b)(1) of the Act, 21 U.S.C. section 360bbb-3(b)(1), unless the authorization is  terminated or revoked.  Performed at [facility], [address], [place],  [address]      Time coordinating discharge: Over 30 minutes  SIGNED:   Nolberto Hanlon, MD  [facility] 08/30/2022, 10:36  AM Pager   If 7PM-7AM, please contact night-coverage [email or web address] Password [email or web address]

## 2022-08-31 ENCOUNTER — Inpatient Hospital Stay: Payer: PPO

## 2022-08-31 DIAGNOSIS — U071 COVID-19: Secondary | ICD-10-CM | POA: Diagnosis not present

## 2022-08-31 LAB — D-DIMER, QUANTITATIVE: D-Dimer, Quant: 0.94 ug/mL-FEU — ABNORMAL HIGH (ref 0.00–0.50)

## 2022-08-31 LAB — C-REACTIVE PROTEIN: CRP: 0.6 mg/dL (ref <1.0)

## 2022-08-31 MED ORDER — LIDOCAINE 5 % EX PTCH
1.0000 | MEDICATED_PATCH | CUTANEOUS | Status: DC
  Administered 2022-08-31 – 2022-09-09 (×10): 1 via TRANSDERMAL
  Filled 2022-08-31 (×10): qty 1

## 2022-08-31 NOTE — Progress Notes (Signed)
Pts sister, Minda Meo, notified of pts fall.

## 2022-08-31 NOTE — Progress Notes (Signed)
PROGRESS NOTE    Sheila Long  [id number] DOB: January 31, 1924 DOA: 08/27/2022 PCP: Gladstone Lighter, MD    Brief Narrative:  Sheila Long is a 88 y.o. Caucasian female with medical history significant for CHF coronary artery disease, GERD, hypertension, osteoarthritis, peripheral vascular disease, anxiety and sarcoidosis, who presented to the emergency room with acute onset of worsening dyspnea with associated cough productive of clear sputum and occasional wheezing which have been going on for the last week.    COVID-19 PCR came back positive   9/2 overnight was feeling funny in her bed and reports had a "rough night", fell, ct head negative. This am denies HA. Has some pulling feeling of neck. No other complaints.  Consultants:    Procedures:   Antimicrobials:      Subjective: No sob or cp  Objective: Vitals:   08/30/22 1540 08/30/22 2016 08/31/22 0136 08/31/22 0745  BP: 133/64 133/70 138/66 (!) 137/57  Pulse: 66 63 72 63  Resp: 17 16 20 18   Temp: (!) 97.5 F (36.4 C) 97.8 F (36.6 C) 97.7 F (36.5 C) 98.6 F (37 C)  TempSrc:  Oral Oral   SpO2: 99% 94% 99% 100%  Weight:      Height:       No intake or output data in the 24 hours ending 08/31/22 1218  Filed Weights   08/27/22 2137  Weight: 93.9 kg    Examination: Calm, NAD Cta no w/r Reg s1/s2 no gallop Soft benign +bs No edema Awake and alert Mood and affect appropriate in current setting   Data Reviewed: I have personally reviewed following labs and imaging studies  CBC: Recent Labs  Lab 08/27/22 2207 08/28/22 0316 08/29/22 0526  WBC 7.1 7.0 6.8  NEUTROABS  --   --  3.3  HGB 10.4* 9.8* 9.2*  HCT 33.7* 32.1* 30.2*  MCV 90.6 90.2 89.6  PLT 221 215 [id number]   Basic Metabolic Panel: Recent Labs  Lab 08/27/22 2207 08/28/22 0316 08/29/22 0526  NA 139 140  --   K 4.6 3.9  --   CL 107 107  --   CO2 25 27  --   GLUCOSE 121* 143*  --   BUN 19 18  --   CREATININE 1.48* 1.38*  1.12*  CALCIUM 9.0 8.5*  --    GFR: Estimated Creatinine Clearance: 32.7 mL/min (A) (by C-G formula based on SCr of 1.12 mg/dL (H)). Liver Function Tests: No results for input(s): "AST", "ALT", "ALKPHOS", "BILITOT", "PROT", "ALBUMIN" in the last 168 hours. No results for input(s): "LIPASE", "AMYLASE" in the last 168 hours. No results for input(s): "AMMONIA" in the last 168 hours. Coagulation Profile: No results for input(s): "INR", "PROTIME" in the last 168 hours. Cardiac Enzymes: No results for input(s): "CKTOTAL", "CKMB", "CKMBINDEX", "TROPONINI" in the last 168 hours. BNP (last 3 results) No results for input(s): "PROBNP" in the last 8760 hours. HbA1C: No results for input(s): "HGBA1C" in the last 72 hours. CBG: No results for input(s): "GLUCAP" in the last 168 hours. Lipid Profile: No results for input(s): "CHOL", "HDL", "LDLCALC", "TRIG", "CHOLHDL", "LDLDIRECT" in the last 72 hours. Thyroid Function Tests: No results for input(s): "TSH", "T4TOTAL", "FREET4", "T3FREE", "THYROIDAB" in the last 72 hours.  Anemia Panel: Recent Labs    08/29/22 0526  FERRITIN 18   Sepsis Labs: Recent Labs  Lab 08/28/22 0316  PROCALCITON <0.10    Recent Results (from the past 240 hour(s))  Resp Panel by RT-PCR (Flu A&B, Covid) Anterior Nasal Swab  Status: Abnormal   Collection Time: 08/27/22 10:06 PM   Specimen: Anterior Nasal Swab  Result Value Ref Range Status   SARS Coronavirus 2 by RT PCR POSITIVE (A) NEGATIVE Final    Comment: (NOTE) SARS-CoV-2 target nucleic acids are DETECTED.  The SARS-CoV-2 RNA is generally detectable in upper respiratory specimens during the acute phase of infection. Positive results are indicative of the presence of the identified virus, but do not rule out bacterial infection or co-infection with other pathogens not detected by the test. Clinical correlation with patient history and other diagnostic information is necessary to determine  patient infection status. The expected result is Negative.  Fact Sheet for Patients: BloggerCourse.com  Fact Sheet for Healthcare Providers: SeriousBroker.it  This test is not yet approved or cleared by the [place] FDA and  has been authorized for detection and/or diagnosis of SARS-CoV-2 by FDA under an Emergency Use Authorization (EUA).  This EUA will remain in effect (meaning this test can be used) for the duration of  the COVID-19 declaration under Section 564(b)(1) of the A ct, 21 U.S.C. section 360bbb-3(b)(1), unless the authorization is terminated or revoked sooner.     Influenza A by PCR NEGATIVE NEGATIVE Final   Influenza B by PCR NEGATIVE NEGATIVE Final    Comment: (NOTE) The Xpert Xpress SARS-CoV-2/FLU/RSV plus assay is intended as an aid in the diagnosis of influenza from Nasopharyngeal swab specimens and should not be used as a sole basis for treatment. Nasal washings and aspirates are unacceptable for Xpert Xpress SARS-CoV-2/FLU/RSV testing.  Fact Sheet for Patients: BloggerCourse.com  Fact Sheet for Healthcare Providers: SeriousBroker.it  This test is not yet approved or cleared by the [place] FDA and has been authorized for detection and/or diagnosis of SARS-CoV-2 by FDA under an Emergency Use Authorization (EUA). This EUA will remain in effect (meaning this test can be used) for the duration of the COVID-19 declaration under Section 564(b)(1) of the Act, 21 U.S.C. section 360bbb-3(b)(1), unless the authorization is terminated or revoked.  Performed at [facility], [address] [place], [place] [address]          Radiology Studies: CT HEAD WO CONTRAST ( )  Result Date: 08/31/2022 CLINICAL DATA:  Head trauma, minor (Age >= 65y) EXAM: CT HEAD WITHOUT CONTRAST TECHNIQUE: Contiguous axial images were obtained from the base of the  skull through the vertex without intravenous contrast. RADIATION DOSE REDUCTION: This exam was performed according to the departmental dose-optimization program which includes automated exposure control, adjustment of the mA and/or kV according to patient size and/or use of iterative reconstruction technique. COMPARISON:  None Available. FINDINGS: Brain: Normal anatomic configuration. Parenchymal volume loss is commensurate with the patient's age. Mild periventricular white matter changes are present likely reflecting the sequela of small vessel ischemia. No abnormal intra or extra-axial mass lesion or fluid collection. No abnormal mass effect or midline shift. No evidence of acute intracranial hemorrhage or infarct. Ventricular size is normal. Cerebellum unremarkable. Vascular: No asymmetric hyperdense vasculature at the skull base. Moderate atherosclerotic calcification noted within the carotid siphons. Skull: Intact Sinuses/Orbits: There is extensive layering fluid within the visualized left maxillary sinus. Remaining paranasal sinuses are clear. Orbits are unremarkable. Other: Mastoid air cells and middle ear cavities are clear. IMPRESSION: 1. No acute intracranial abnormality. No calvarial fracture. 2. Left maxillary sinus disease. Electronically Signed   By: Helyn Numbers M.D.   On: 08/31/2022 02:59        Scheduled Meds:  vitamin C  500  mg Oral Daily   carbidopa-levodopa  1 tablet Oral QHS   cholecalciferol  1,000 Units Oral Daily   clopidogrel  75 mg Oral Daily   cyanocobalamin  1,000 mcg Oral Daily   enoxaparin (LOVENOX) injection  40 mg Subcutaneous Q24H   ferrous sulfate  325 mg Oral BID   guaiFENesin  600 mg Oral BID   latanoprost  1 drop Both Eyes QHS   lidocaine  1 patch Transdermal Q24H   metoprolol succinate  100 mg Oral Daily   molnupiravir EUA  4 capsule Oral BID   pantoprazole  40 mg Oral BID   senna  2 tablet Oral BID   zinc sulfate  220 mg Oral Daily   Continuous  Infusions:    Assessment & Plan:   Principal Problem:   COVID-19 virus infection Active Problems:   UTI (urinary tract infection)   Acute kidney injury superimposed on chronic kidney disease (HCC)   Essential hypertension   GERD with esophagitis   COVID-19 virus infection - The patient will be admitted to a medical telemetry bed. - This would explain her respiratory symptoms and generalized weakness. 8/30 continue p.o. Molnupiravir given AKI on ckd F/u inflammatory markers Continue vitamin C, D3, zinc PT/OT 9/2 continue current mx     UTI (urinary tract infection) Tx with rocephin  No ucx     Acute kidney injury superimposed on chronic kidney disease (HCC) - She has AKI superimposed on stage IIIa chronic kidney disease. 8/31 improving slowly Dc ivf since at baseline. 9/2 cr at baseline    Essential hypertension Hold diuretics and ARB's with AKI 9/2 stable  GERD with esophagitis Continue PPI   DVT prophylaxis: Lovenox Code Status: Partial Family Communication: None at bedside Disposition Plan: Home with home health Status is: Inpatient Remains inpatient appropriate because: pt was discharged and has appealed d/c        LOS: 4 days   Time spent: 35 minutes    Lynn Ito, MD [facility] Pager [phone]  If 7PM-7AM, please contact night-coverage 08/31/2022, 12:18 PM

## 2022-08-31 NOTE — Plan of Care (Signed)

## 2022-08-31 NOTE — Progress Notes (Signed)
   08/31/22 0136  What Happened  Was fall witnessed? No  Was patient injured? Unsure (no apparent injuries, pt c/o HA and right shoulder pain)  Patient found on floor  Found by Staff-comment Jeralyn Ruths, RN)  Stated prior activity other (comment) (pt stated that she was attempting to "place mattress onto floor to go to sleep")  Follow Up  MD notified Manuela Schwartz, NP  Time MD notified [phone]  Additional tests Yes-comment (STAT Head CT)  Progress note created (see row info) Yes  Adult Fall Risk Assessment  Risk Factor Category (scoring not indicated) High fall risk per protocol (document High fall risk)  Patient Fall Risk Level High fall risk  Adult Fall Risk Interventions  Required Bundle Interventions *See Row Information* High fall risk - low, moderate, and high requirements implemented  Additional Interventions Use of appropriate toileting equipment (bedpan, BSC, etc.);Room near nurses station  Screening for Fall Injury Risk (To be completed on HIGH fall risk patients) - Assessing Need for Floor Mats  Risk For Fall Injury- Criteria for Floor Mats 85 years or older  Will Implement Floor Mats Yes  Vitals  Temp 97.7 F (36.5 C)  Temp Source Oral  BP 138/66  MAP (mmHg) 87  BP Location Right Arm  BP Method Automatic  Patient Position (if appropriate) Lying  Pulse Rate 72  Pulse Rate Source Monitor  Resp 20  Oxygen Therapy  SpO2 99 %  O2 Device Room Air  Pain Assessment  Pain Scale 0-10  Pain Score 4  Pain Type Acute pain  Pain Location Head  Pain Descriptors / Indicators Aching  Patients Stated Pain Goal 0  Pain Intervention(s) Medication (See eMAR);MD notified (Comment)  Multiple Pain Sites Yes  2nd Pain Site  Pain Score 4  Pain Type Acute pain  Pain Location Shoulder  Pain Orientation Right  Pain Descriptors / Indicators Aching  Patient's Stated Pain Goal 0  Pain Intervention(s) Medication (See eMAR);MD notified (Comment)  PCA/Epidural/Spinal Assessment   Respiratory Pattern Regular;Unlabored;Dyspnea with exertion  Neurological  Neuro (WDL) X  Level of Consciousness Alert  Orientation Level Oriented to person;Oriented to place;Disoriented to time;Disoriented to situation  Cognition Follows commands;Impulsive;Poor attention/concentration;Poor judgement;Poor safety awareness;Memory impairment  Speech Clear  R Pupil Size (mm) 4  R Pupil Shape Round  R Pupil Reaction Brisk  L Pupil Size (mm) 4  L Pupil Shape Round  L Pupil Reaction Brisk  Motor Function/Sensation Assessment Grip;Dorsiflexion;Plantar flexion;Motor response;Sensation;Motor strength  R Hand Grip Moderate  L Hand Grip Moderate  R Foot Dorsiflexion Weak  L Foot Dorsiflexion Weak  R Foot Plantar Flexion Weak  L Foot Plantar Flexion Weak  RUE Motor Response Purposeful movement  RUE Sensation Full sensation  RUE Motor Strength 4  LUE Motor Response Purposeful movement  LUE Sensation Full sensation  LUE Motor Strength 4  RLE Motor Response Purposeful movement  RLE Sensation Full sensation  RLE Motor Strength 3  LLE Motor Response Purposeful movement  LLE Sensation Full sensation  LLE Motor Strength 3  Neuro Symptoms Forgetful  Musculoskeletal  Musculoskeletal (WDL) X  Assistive Device BSC;Front wheel walker  Generalized Weakness Yes  Weight Bearing Restrictions No  Musculoskeletal Details  RUE Full movement;Weakness  LUE Full movement;Weakness  RLE Full movement;Weakness  LLE Full movement;Weakness  Integumentary  Integumentary (WDL) X  Skin Color Appropriate for ethnicity  Skin Condition Dry  Skin Integrity Erythema/redness  Erythema/Redness Location Breast;Buttocks  Erythema/Redness Location Orientation Bilateral  Skin Turgor Non-tenting

## 2022-08-31 NOTE — Progress Notes (Signed)
[facility]) hospitalized hospice patient visit   Effective 9.2.23, Sheila Long is no longer an active hospice patient as family has chosen to revoke their hospice benefit to pursue rehab services.   Please call with any questions/concerns.    Thank you for the opportunity to participate in this patient's care.   Odette Fraction, MSW [facility] Liaison  [phone]

## 2022-08-31 NOTE — Progress Notes (Signed)
Patient's bed was alarming that the patient was attempting to get out of bed. Upon staff entering room, patient was seen on the floor beside of bed. Patient stated that she believed that her "bed was moving and trying to throw her out of the bed" and stated that she was attempting to "put mattress onto the floor to go to sleep." Patient was assisted back into bed by staff and assessed by primary RN. Pt c/o HA and R shoulder pain, no apparent injuries noted. Pt with full ROM to R shoulder. Attending made aware of event. STAT Head CT ordered.

## 2022-09-01 DIAGNOSIS — U071 COVID-19: Secondary | ICD-10-CM | POA: Diagnosis not present

## 2022-09-01 LAB — C-REACTIVE PROTEIN: CRP: 0.7 mg/dL (ref <1.0)

## 2022-09-01 LAB — D-DIMER, QUANTITATIVE: D-Dimer, Quant: 0.72 ug/mL-FEU — ABNORMAL HIGH (ref 0.00–0.50)

## 2022-09-01 MED ORDER — ORAL CARE MOUTH RINSE: 15.0000 mL | OROMUCOSAL | Status: DC | PRN

## 2022-09-01 NOTE — TOC Progression Note (Signed)
Transition of Care [facility]) - Progression Note    Patient Details  Name: Sheila Long MRN: [id number] Date of Birth: 01-19-1924  [facility]) CM/SW Contact  Bing Quarry, RN Phone Number: 09/01/2022, 8:19 PM  Clinical Narrative:   9/3: PT evaluation recommended SNF. STR/SNF bed search initiated after speaking with sister. Existing PASRR [id number] A from 11/27/2008. Was at [facility] at [place] in 2018. Does not want [facility], [facility], or [facility], but close to [place]. Gabriel Cirri RN CM          Expected Discharge Plan and Services           Expected Discharge Date: 08/30/22                                     Social Determinants of Health (SDOH) Interventions    Readmission Risk Interventions     No data to display

## 2022-09-01 NOTE — Progress Notes (Signed)
PROGRESS NOTE    Sheila Long  [id number] DOB: 1924/04/03 DOA: 08/27/2022 PCP: Gladstone Lighter, MD    Brief Narrative:  Sheila Long is a 88 y.o. Caucasian female with medical history significant for CHF coronary artery disease, GERD, hypertension, osteoarthritis, peripheral vascular disease, anxiety and sarcoidosis, who presented to the emergency room with acute onset of worsening dyspnea with associated cough productive of clear sputum and occasional wheezing which have been going on for the last week.    COVID-19 PCR came back positive   9/2 overnight was feeling funny in her bed and reports had a "rough night", fell, ct head negative. This am denies HA. Has some pulling feeling of neck. No other complaints.  9/3 PT rec. SNF now  Consultants:    Procedures:   Antimicrobials:      Subjective: Pt without complaints. Denies sob, cough , or other sx  Objective: Vitals:   08/31/22 2157 09/01/22 0204 09/01/22 0351 09/01/22 0834  BP: (!) 125/47  (!) 129/53 (!) 129/51  Pulse: 70 77 67 75  Resp: 18  18 20   Temp: 98.2 F (36.8 C)  (!) 97.5 F (36.4 C) 98.8 F (37.1 C)  TempSrc: Oral     SpO2: 99% 97% 96% 97%  Weight:      Height:       No intake or output data in the 24 hours ending 09/01/22 0857  Filed Weights   08/27/22 2137  Weight: 93.9 kg    Examination: Calm, NAD Cta no w/r Reg s1/s2 no gallop Soft benign +bs Awake and alert Mood and affect appropriate in current setting   Data Reviewed: I have personally reviewed following labs and imaging studies  CBC: Recent Labs  Lab 08/27/22 2207 08/28/22 0316 08/29/22 0526  WBC 7.1 7.0 6.8  NEUTROABS  --   --  3.3  HGB 10.4* 9.8* 9.2*  HCT 33.7* 32.1* 30.2*  MCV 90.6 90.2 89.6  PLT 221 215 [id number]   Basic Metabolic Panel: Recent Labs  Lab 08/27/22 2207 08/28/22 0316 08/29/22 0526  NA 139 140  --   K 4.6 3.9  --   CL 107 107  --   CO2 25 27  --   GLUCOSE 121* 143*  --   BUN 19  18  --   CREATININE 1.48* 1.38* 1.12*  CALCIUM 9.0 8.5*  --    GFR: Estimated Creatinine Clearance: 32.7 mL/min (A) (by C-G formula based on SCr of 1.12 mg/dL (H)). Liver Function Tests: No results for input(s): "AST", "ALT", "ALKPHOS", "BILITOT", "PROT", "ALBUMIN" in the last 168 hours. No results for input(s): "LIPASE", "AMYLASE" in the last 168 hours. No results for input(s): "AMMONIA" in the last 168 hours. Coagulation Profile: No results for input(s): "INR", "PROTIME" in the last 168 hours. Cardiac Enzymes: No results for input(s): "CKTOTAL", "CKMB", "CKMBINDEX", "TROPONINI" in the last 168 hours. BNP (last 3 results) No results for input(s): "PROBNP" in the last 8760 hours. HbA1C: No results for input(s): "HGBA1C" in the last 72 hours. CBG: No results for input(s): "GLUCAP" in the last 168 hours. Lipid Profile: No results for input(s): "CHOL", "HDL", "LDLCALC", "TRIG", "CHOLHDL", "LDLDIRECT" in the last 72 hours. Thyroid Function Tests: No results for input(s): "TSH", "T4TOTAL", "FREET4", "T3FREE", "THYROIDAB" in the last 72 hours.  Anemia Panel: No results for input(s): "VITAMINB12", "FOLATE", "FERRITIN", "TIBC", "IRON", "RETICCTPCT" in the last 72 hours.  Sepsis Labs: Recent Labs  Lab 08/28/22 0316  PROCALCITON <0.10    Recent Results (from the past  240 hour(s))  Resp Panel by RT-PCR (Flu A&B, Covid) Anterior Nasal Swab     Status: Abnormal   Collection Time: 08/27/22 10:06 PM   Specimen: Anterior Nasal Swab  Result Value Ref Range Status   SARS Coronavirus 2 by RT PCR POSITIVE (A) NEGATIVE Final    Comment: (NOTE) SARS-CoV-2 target nucleic acids are DETECTED.  The SARS-CoV-2 RNA is generally detectable in upper respiratory specimens during the acute phase of infection. Positive results are indicative of the presence of the identified virus, but do not rule out bacterial infection or co-infection with other pathogens not detected by the test. Clinical  correlation with patient history and other diagnostic information is necessary to determine patient infection status. The expected result is Negative.  Fact Sheet for Patients: BloggerCourse.com  Fact Sheet for Healthcare Providers: SeriousBroker.it  This test is not yet approved or cleared by the [place] FDA and  has been authorized for detection and/or diagnosis of SARS-CoV-2 by FDA under an Emergency Use Authorization (EUA).  This EUA will remain in effect (meaning this test can be used) for the duration of  the COVID-19 declaration under Section 564(b)(1) of the A ct, 21 U.S.C. section 360bbb-3(b)(1), unless the authorization is terminated or revoked sooner.     Influenza A by PCR NEGATIVE NEGATIVE Final   Influenza B by PCR NEGATIVE NEGATIVE Final    Comment: (NOTE) The Xpert Xpress SARS-CoV-2/FLU/RSV plus assay is intended as an aid in the diagnosis of influenza from Nasopharyngeal swab specimens and should not be used as a sole basis for treatment. Nasal washings and aspirates are unacceptable for Xpert Xpress SARS-CoV-2/FLU/RSV testing.  Fact Sheet for Patients: BloggerCourse.com  Fact Sheet for Healthcare Providers: SeriousBroker.it  This test is not yet approved or cleared by the [place] FDA and has been authorized for detection and/or diagnosis of SARS-CoV-2 by FDA under an Emergency Use Authorization (EUA). This EUA will remain in effect (meaning this test can be used) for the duration of the COVID-19 declaration under Section 564(b)(1) of the Act, 21 U.S.C. section 360bbb-3(b)(1), unless the authorization is terminated or revoked.  Performed at [facility], [address] [place], [place] [address]          Radiology Studies: CT HEAD WO CONTRAST ( )  Result Date: 08/31/2022 CLINICAL DATA:  Head trauma, minor (Age >= 65y) EXAM: CT  HEAD WITHOUT CONTRAST TECHNIQUE: Contiguous axial images were obtained from the base of the skull through the vertex without intravenous contrast. RADIATION DOSE REDUCTION: This exam was performed according to the departmental dose-optimization program which includes automated exposure control, adjustment of the mA and/or kV according to patient size and/or use of iterative reconstruction technique. COMPARISON:  None Available. FINDINGS: Brain: Normal anatomic configuration. Parenchymal volume loss is commensurate with the patient's age. Mild periventricular white matter changes are present likely reflecting the sequela of small vessel ischemia. No abnormal intra or extra-axial mass lesion or fluid collection. No abnormal mass effect or midline shift. No evidence of acute intracranial hemorrhage or infarct. Ventricular size is normal. Cerebellum unremarkable. Vascular: No asymmetric hyperdense vasculature at the skull base. Moderate atherosclerotic calcification noted within the carotid siphons. Skull: Intact Sinuses/Orbits: There is extensive layering fluid within the visualized left maxillary sinus. Remaining paranasal sinuses are clear. Orbits are unremarkable. Other: Mastoid air cells and middle ear cavities are clear. IMPRESSION: 1. No acute intracranial abnormality. No calvarial fracture. 2. Left maxillary sinus disease. Electronically Signed   By: Lyda Kalata.D.  On: 08/31/2022 02:59        Scheduled Meds:  vitamin C  500 mg Oral Daily   carbidopa-levodopa  1 tablet Oral QHS   cholecalciferol  1,000 Units Oral Daily   clopidogrel  75 mg Oral Daily   cyanocobalamin  1,000 mcg Oral Daily   enoxaparin (LOVENOX) injection  40 mg Subcutaneous Q24H   ferrous sulfate  325 mg Oral BID   guaiFENesin  600 mg Oral BID   latanoprost  1 drop Both Eyes QHS   lidocaine  1 patch Transdermal Q24H   metoprolol succinate  100 mg Oral Daily   molnupiravir EUA  4 capsule Oral BID   pantoprazole  40 mg  Oral BID   senna  2 tablet Oral BID   zinc sulfate  220 mg Oral Daily   Continuous Infusions:    Assessment & Plan:   Principal Problem:   COVID-19 virus infection Active Problems:   UTI (urinary tract infection)   Acute kidney injury superimposed on chronic kidney disease (HCC)   Essential hypertension   GERD with esophagitis   COVID-19 virus infection - The patient will be admitted to a medical telemetry bed. - This would explain her respiratory symptoms and generalized weakness. 8/30 continue p.o. Molnupiravir given AKI on ckd F/u inflammatory markers Continue vitamin C, D3, zinc PT/OT 9/3 continue current tx .      UTI (urinary tract infection) Was tx with Rocephin Ucx not done    Acute kidney injury superimposed on chronic kidney disease (HCC) - She has AKI superimposed on stage IIIa chronic kidney disease. 8/31 improving slowly Dc ivf since at baseline. 9/3 cr at baseline      Essential hypertension Hold diuretics and ARB's with AKI 9/3 stable. monitor  GERD with esophagitis Continue PPI   DVT prophylaxis: Lovenox Code Status: Partial Family Communication: None at bedside Disposition Plan: SNF Status is: Inpatient Remains inpatient appropriate because: pt was discharged and has appealed .Marland KitchenMarland Kitchen>now PT recommends SNF-pending        LOS: 5 days   Time spent: 35 minutes    Lynn Ito, MD [facility] Pager [phone]  If 7PM-7AM, please contact night-coverage 09/01/2022, 8:57 AM

## 2022-09-01 NOTE — Progress Notes (Signed)
[facility] Liaison Note  Previous hospice patient.  [facility] palliative hospital liaison will follow patient for discharge disposition.   Please call with any questions/concerns.    Thank you for the opportunity to participate in this patient's care.   Odette Fraction, MSW [facility] Liaison  [phone]

## 2022-09-01 NOTE — NC FL2 (Signed)
[place] MEDICAID FL2 LEVEL OF CARE SCREENING TOOL     IDENTIFICATION  Patient Name: Sheila Long Birthdate: 1924/07/21 Sex: female Admission Date (Current Location): 08/27/2022  [facility] and [place] Number:  Chiropodist and Address:  [facility], [address] [place], [place] [address]      Provider Number: [id number]  Attending Physician Name and Address:  Lynn Ito, MD  Relative Name and Phone Number:  Malena Catholic (Sister)   [phone] [facility])    Current Level of Care: Hospital Recommended Level of Care: Skilled Nursing Facility Prior Approval Number: [id number]  Date Approved/Denied: 11/27/08 PASRR Number:    Discharge Plan: SNF    Current Diagnoses: Patient Active Problem List   Diagnosis Date Noted   COVID-19 virus infection 08/28/2022   Essential hypertension 08/28/2022   Acute kidney injury superimposed on chronic kidney disease (HCC) 08/28/2022   GERD with esophagitis 08/28/2022   Generalized weakness    UTI (urinary tract infection) 08/27/2022   Acute on chronic congestive heart failure [facility])    Hospice care    Acute heart failure with preserved ejection fraction (HCC) 06/02/2022   Obesity (BMI 30-39.9) 06/02/2022   Stage 3a chronic kidney disease (CKD) (HCC) 06/01/2022   Atypical chest pain 06/01/2022   Acute on chronic anemia 04/21/2022   Parkinson disease (HCC) 04/21/2022   Leg swelling 04/21/2022   Dizziness 01/15/2021   Carpal tunnel syndrome 12/11/2020   PND (post-nasal drip)    Stenosis of right carotid artery    Acute kidney injury superimposed on CKD (HCC)    Elevated troponin 11/18/2020   [name] 11/17/2020   Back pain without sciatica 11/07/2020   Numbness in both hands 11/07/2020   Numbness 10/05/2019   Axillary abscess 11/12/2018   Status post total replacement of both hips 10/04/2018   Prediabetes 10/30/2017   Acute on chronic diastolic CHF (congestive heart failure)  (HCC) 10/29/2017   Fatigue 07/29/2017   Mood disorder (HCC) 07/29/2017   Constipation 06/26/2017   S/P total knee arthroplasty 06/23/2017   Pulmonary hypertension associated with sarcoidosis (HCC) 02/03/2017   Lower extremity edema 11/28/2016   Familial tremor 09/24/2016   Hypertension    Allergic rhinitis due to pollen    Coronary artery disease    Aortic atherosclerosis (HCC)    GERD (gastroesophageal reflux disease)    Peripheral vascular disease (HCC)    Osteoarthritis of both knees    Obstructive sleep apnea    Neuropathy    Dyslipidemia 11/19/2013   Thyroid nodule 11/19/2013    Orientation RESPIRATION BLADDER Height & Weight     Self, Place  Normal Continent Weight: 93.9 kg Height:  5\' 4"  (162.6 cm)  BEHAVIORAL SYMPTOMS/MOOD NEUROLOGICAL BOWEL NUTRITION STATUS      Continent Diet  AMBULATORY STATUS COMMUNICATION OF NEEDS Skin   Extensive Assist Verbally Other (Comment) (redness under breast and on buttocks)                       Personal Care Assistance Level of Assistance  Bathing, Feeding, Dressing Bathing Assistance: Maximum assistance Feeding assistance: Limited assistance Dressing Assistance: Maximum assistance     Functional Limitations Info  Sight, Hearing (Glasses) Sight Info: Impaired Hearing Info: Impaired      SPECIAL CARE FACTORS FREQUENCY  PT (By licensed PT), OT (By licensed OT)     PT Frequency: 5X/week OT Frequency: 5x/week            Contractures Contractures Info:  Not present    Additional Factors Info  Code Status, Allergies, Isolation Precautions Code Status Info: Partial/See chart for details Allergies Info: Sulfa drugs; unspecified reaction. Zocor; myalgias. Tramadol; confusion     Isolation Precautions Info: Airborne/Contact     Current Medications (09/01/2022):  This is the current hospital active medication list Current Facility-Administered Medications  Medication Dose Route Frequency Provider Last Rate Last Admin    acetaminophen (TYLENOL) tablet 650 mg  650 mg Oral Q6H PRN Mansy, Jan A, MD   650 mg at 09/01/22 0208   Or   acetaminophen (TYLENOL) suppository 650 mg  650 mg Rectal Q6H PRN Mansy, Jan A, MD       ascorbic acid (VITAMIN C) tablet 500 mg  500 mg Oral Daily Mansy, Jan A, MD   500 mg at 09/01/22 [id number]   carbidopa-levodopa (SINEMET IR) 25-100 MG per tablet immediate release 1 tablet  1 tablet Oral QHS Nolberto Hanlon, MD   1 tablet at 09/01/22 2012   chlorpheniramine-HYDROcodone ([place]) 10-8 MG/5ML suspension 5 mL  5 mL Oral Q12H PRN Mansy, Jan A, MD   5 mL at 08/31/22 1628   cholecalciferol (VITAMIN D3) 25 MCG (1000 UNIT) tablet 1,000 Units  1,000 Units Oral Daily Mansy, Jan A, MD   1,000 Units at 09/01/22 [id number]   clopidogrel (PLAVIX) tablet 75 mg  75 mg Oral Daily Nolberto Hanlon, MD   75 mg at 09/01/22 [id number]   cyanocobalamin (VITAMIN B12) tablet 1,000 mcg  1,000 mcg Oral Daily Mansy, Jan A, MD   1,000 mcg at 09/01/22 0929   enoxaparin (LOVENOX) injection 40 mg  40 mg Subcutaneous Q24H Wynelle Cleveland, RPH   40 mg at 09/01/22 2014   ferrous sulfate tablet 325 mg  325 mg Oral BID Mansy, Jan A, MD   325 mg at 09/01/22 2012   guaiFENesin (MUCINEX) 12 hr tablet 600 mg  600 mg Oral BID Mansy, Jan A, MD   600 mg at 09/01/22 2012   guaiFENesin-dextromethorphan (ROBITUSSIN DM) 100-10 MG/5ML syrup 10 mL  10 mL Oral Q4H PRN Mansy, Jan A, MD   10 mL at 09/01/22 2012   haloperidol (HALDOL) tablet 5 mg  5 mg Oral Q4H PRN Mansy, Jan A, MD       Ipratropium-Albuterol (COMBIVENT) respimat 1 puff  1 puff Inhalation Q4H PRN Benita Gutter, RPH   1 puff at 08/29/22 2043   latanoprost (XALATAN) 0.005 % ophthalmic solution 1 drop  1 drop Both Eyes QHS Mansy, Jan A, MD   1 drop at 09/01/22 2013   lidocaine (LIDODERM) 5 % 1 patch  1 patch Transdermal Q24H Nolberto Hanlon, MD   1 patch at 09/01/22 0920   magnesium hydroxide (MILK OF MAGNESIA) suspension 30 mL  30 mL Oral Daily PRN Mansy, Jan A, MD   30 mL at 08/28/22 2318    metoprolol succinate (TOPROL-XL) 24 hr tablet 100 mg  100 mg Oral Daily Mansy, Jan A, MD   100 mg at 09/01/22 0929   ondansetron (ZOFRAN) injection 4 mg  4 mg Intravenous Q6H PRN Mansy, Jan A, MD       ondansetron (ZOFRAN-ODT) disintegrating tablet 4 mg  4 mg Oral Q6H PRN Mansy, Jan A, MD   4 mg at 08/30/22 [id number]   Oral care mouth rinse  15 mL Mouth Rinse PRN Nolberto Hanlon, MD       pantoprazole (PROTONIX) EC tablet 40 mg  40 mg Oral BID Mansy, Arvella Merles, MD  40 mg at 09/01/22 2012   prochlorperazine (COMPAZINE) tablet 10 mg  10 mg Oral Q4H PRN Mansy, Jan A, MD       senna (SENOKOT) tablet 17.2 mg  2 tablet Oral BID Mansy, Jan A, MD   17.2 mg at 09/01/22 2012   traZODone (DESYREL) tablet 25 mg  25 mg Oral QHS PRN Mansy, Jan A, MD   25 mg at 08/30/22 2008   zinc sulfate capsule 220 mg  220 mg Oral Daily Mansy, Jan A, MD   220 mg at 09/01/22 [id number]     Discharge Medications: Please see discharge summary for a list of discharge medications.  Relevant Imaging Results:  Relevant Lab Results:   Additional Information SS# [id number]  Bing Quarry, RN

## 2022-09-01 NOTE — Progress Notes (Signed)
Physical Therapy Treatment Patient Details Name: Sheila Long MRN: [id number] DOB: 1924-01-04 Today's Date: 09/01/2022   History of Present Illness 88 y.o. female with medical history of CHF coronary artery disease, GERD, hypertension, osteoarthritis, peripheral vascular disease, anxiety and sarcoidosis, who presented to the emergency room with acute onset of worsening dyspnea with associated cough productive of clear sputum and occasional wheezing. COVID-19 PCR came back positive.    PT Comments    Patient received in bed, reports she just got comfortable but agrees to work with PT. Patient is requiring mod A for bed mobility with heavy use of rails. She stood with min guard and ambulated 25 feet in room with RW and min guard. Slow, steady pace. Patient lives alone but has neighbors and other assistance at home most of the time. She will continue to benefit from skilled PT while here to improve strength and functional independence. Patient will benefit from a short stay and SNF prior to returning home for safety.     Recommendations for follow up therapy are one component of a multi-disciplinary discharge planning process, led by the attending physician.  Recommendations may be updated based on patient status, additional functional criteria and insurance authorization.  Follow Up Recommendations  Skilled nursing-short term rehab (<3 hours/day) Can patient physically be transported by private vehicle: No   Assistance Recommended at Discharge Frequent or constant Supervision/Assistance  Patient can return home with the following A little help with walking and/or transfers;A little help with bathing/dressing/bathroom;Help with stairs or ramp for entrance;Assist for transportation;Assistance with cooking/housework   Equipment Recommendations  None recommended by PT    Recommendations for Other Services       Precautions / Restrictions Precautions Precautions:  Fall Restrictions Weight Bearing Restrictions: No     Mobility  Bed Mobility Overal bed mobility: Needs Assistance Bed Mobility: Supine to Sit, Sit to Supine     Supine to sit: Mod assist Sit to supine: Mod assist   General bed mobility comments: Needed mod A to get seated on edge of bed. Mod A to bring LEs back up onto bed at end of session    Transfers Overall transfer level: Needs assistance Equipment used: Rolling walker (2 wheels) Transfers: Sit to/from Stand Sit to Stand: Min guard                Ambulation/Gait Ambulation/Gait assistance: Min guard Gait Distance (Feet): 25 Feet Assistive device: Rolling walker (2 wheels) Gait Pattern/deviations: Step-through pattern, Decreased step length - right, Decreased step length - left, Trunk flexed, Decreased stride length Gait velocity: decreased     General Gait Details: Min guard, no lob.   Stairs             Wheelchair Mobility    Modified Rankin (Stroke Patients Only)       Balance Overall balance assessment: Needs assistance Sitting-balance support: Feet supported Sitting balance-Leahy Scale: Good     Standing balance support: Bilateral upper extremity supported, During functional activity, Reliant on assistive device for balance Standing balance-Leahy Scale: Fair                              Cognition Arousal/Alertness: Awake/alert Behavior During Therapy: WFL for tasks assessed/performed Overall Cognitive Status: Within Functional Limits for tasks assessed  Exercises      General Comments        Pertinent Vitals/Pain Pain Assessment Pain Assessment: No/denies pain    Home Living                          Prior Function            PT Goals (current goals can now be found in the care plan section) Acute Rehab PT Goals Patient Stated Goal: go to rehab to get stronger, then home. PT Goal  Formulation: With patient Time For Goal Achievement: 09/11/22 Potential to Achieve Goals: Good Progress towards PT goals: Progressing toward goals    Frequency    Min 2X/week      PT Plan Discharge plan needs to be updated    Co-evaluation              AM-PAC PT "6 Clicks" Mobility   Outcome Measure  Help needed turning from your back to your side while in a flat bed without using bedrails?: A Little Help needed moving from lying on your back to sitting on the side of a flat bed without using bedrails?: A Lot Help needed moving to and from a bed to a chair (including a wheelchair)?: A Little Help needed standing up from a chair using your arms (e.g., wheelchair or bedside chair)?: A Little Help needed to walk in hospital room?: A Little Help needed climbing 3-5 steps with a railing? : A Lot 6 Click Score: 16    End of Session Equipment Utilized During Treatment: Gait belt Activity Tolerance: Patient limited by fatigue Patient left: in bed;with call bell/phone within reach;with bed alarm set Nurse Communication: Mobility status PT Visit Diagnosis: Muscle weakness (generalized) (M62.81);Difficulty in walking, not elsewhere classified (R26.2)     Time: 0955-1010 PT Time Calculation (min) (ACUTE ONLY): 15 min  Charges:  $Gait Training: 8-22 mins                     Huan Pollok, PT, GCS 09/01/22,10:54 AM

## 2022-09-01 NOTE — TOC Progression Note (Addendum)
Transition of Care [facility]) - Progression Note    Patient Details  Name: Keara Pagliarulo MRN: [id number] Date of Birth: [birth year]  Transition of Care [facility]) CM/SW Contact  Bing Quarry, RN Phone Number: 09/01/2022, 4:00 PM  Clinical Narrative: 9/3:400 pm 09/01/22. [facility] requested further documents which were uploaded by [facility]. Fax just received upholding discharge. [facility] fax noted that they had tried to reach the sister at 237 pm but she was here at the hospital. RN CM just contacted sister and informed her of appeal decision. She had just returned home. Number to call and Case ID given to her with information to call before 12 noon on 09/02/22 if she wished to appeal or discuss the decision further. This form will be left per sister request in patient's room in the window face down. Gabriel Cirri RN CM   [facility] Case ID: [id number] Gabriel Cirri RN CM [id number].   Update: 5 pm: PT has recommended SNF today during this process. Called sister back to discuss SNF/STR facilities and she said her sister does not want [facility], [facility], or [facility]. Otherwise [place] area. Gabriel Cirri RN CM       Expected Discharge Plan and Services           Expected Discharge Date: 08/30/22                                     Social Determinants of Health (SDOH) Interventions    Readmission Risk Interventions     No data to display

## 2022-09-02 DIAGNOSIS — U071 COVID-19: Secondary | ICD-10-CM | POA: Diagnosis not present

## 2022-09-02 LAB — D-DIMER, QUANTITATIVE: D-Dimer, Quant: 0.65 ug/mL-FEU — ABNORMAL HIGH (ref 0.00–0.50)

## 2022-09-02 LAB — C-REACTIVE PROTEIN: CRP: 1.6 mg/dL — ABNORMAL HIGH (ref <1.0)

## 2022-09-02 NOTE — TOC Progression Note (Signed)
Transition of Care [facility]) - Progression Note    Patient Details  Name: Sheila Long MRN: [id number] Date of Birth: 1924/04/15  Transition of Care [facility]) CM/SW Contact  Truddie Hidden, RN Phone Number: 09/02/2022, 10:59 AM  Clinical Narrative:    Patient appealed discharge. Per [facility] case is currently in clinical review.         Expected Discharge Plan and Services           Expected Discharge Date: 08/30/22                                     Social Determinants of Health (SDOH) Interventions    Readmission Risk Interventions     No data to display

## 2022-09-02 NOTE — Progress Notes (Signed)
PROGRESS NOTE    Sheila Long  [id number] DOB: 1924/01/19 DOA: 08/27/2022 PCP: Gladstone Lighter, MD    Brief Narrative:  Sheila Long is a 88 y.o. Caucasian female with medical history significant for CHF coronary artery disease, GERD, hypertension, osteoarthritis, peripheral vascular disease, anxiety and sarcoidosis, who presented to the emergency room with acute onset of worsening dyspnea with associated cough productive of clear sputum and occasional wheezing which have been going on for the last week.    COVID-19 PCR came back positive   9/2 overnight was feeling funny in her bed and reports had a "rough night", fell, ct head negative. This am denies HA. Has some pulling feeling of neck. No other complaints. 9/4 SNF pending  Consultants:    Procedures:   Antimicrobials:      Subjective: Denies any shortness of breath or chest pain, no diarrhea    Objective: Vitals:   09/01/22 1047 09/01/22 1644 09/01/22 2021 09/02/22 0650  BP:  (!) 148/56 (!) 153/66 (!) 144/59  Pulse:  64 78 72  Resp:  18 18 18   Temp:  98 F (36.7 C) 98.2 F (36.8 C) 97.9 F (36.6 C)  TempSrc:   Oral   SpO2: 94% 98% 95% 95%  Weight:      Height:       No intake or output data in the 24 hours ending 09/02/22 0806  Filed Weights   08/27/22 2137  Weight: 93.9 kg    Examination: Calm, NAD Cta no w/r Reg s1/s2 no gallop Soft benign +bs No edema Awake and alert Mood and affect appropriate in current setting   Data Reviewed: I have personally reviewed following labs and imaging studies  CBC: Recent Labs  Lab 08/27/22 2207 08/28/22 0316 08/29/22 0526  WBC 7.1 7.0 6.8  NEUTROABS  --   --  3.3  HGB 10.4* 9.8* 9.2*  HCT 33.7* 32.1* 30.2*  MCV 90.6 90.2 89.6  PLT 221 215 [id number]   Basic Metabolic Panel: Recent Labs  Lab 08/27/22 2207 08/28/22 0316 08/29/22 0526  NA 139 140  --   K 4.6 3.9  --   CL 107 107  --   CO2 25 27  --   GLUCOSE 121* 143*  --   BUN  19 18  --   CREATININE 1.48* 1.38* 1.12*  CALCIUM 9.0 8.5*  --    GFR: Estimated Creatinine Clearance: 32.7 mL/min (A) (by C-G formula based on SCr of 1.12 mg/dL (H)). Liver Function Tests: No results for input(s): "AST", "ALT", "ALKPHOS", "BILITOT", "PROT", "ALBUMIN" in the last 168 hours. No results for input(s): "LIPASE", "AMYLASE" in the last 168 hours. No results for input(s): "AMMONIA" in the last 168 hours. Coagulation Profile: No results for input(s): "INR", "PROTIME" in the last 168 hours. Cardiac Enzymes: No results for input(s): "CKTOTAL", "CKMB", "CKMBINDEX", "TROPONINI" in the last 168 hours. BNP (last 3 results) No results for input(s): "PROBNP" in the last 8760 hours. HbA1C: No results for input(s): "HGBA1C" in the last 72 hours. CBG: No results for input(s): "GLUCAP" in the last 168 hours. Lipid Profile: No results for input(s): "CHOL", "HDL", "LDLCALC", "TRIG", "CHOLHDL", "LDLDIRECT" in the last 72 hours. Thyroid Function Tests: No results for input(s): "TSH", "T4TOTAL", "FREET4", "T3FREE", "THYROIDAB" in the last 72 hours.  Anemia Panel: No results for input(s): "VITAMINB12", "FOLATE", "FERRITIN", "TIBC", "IRON", "RETICCTPCT" in the last 72 hours.  Sepsis Labs: Recent Labs  Lab 08/28/22 0316  PROCALCITON <0.10    Recent Results (from the past  240 hour(s))  Resp Panel by RT-PCR (Flu A&B, Covid) Anterior Nasal Swab     Status: Abnormal   Collection Time: 08/27/22 10:06 PM   Specimen: Anterior Nasal Swab  Result Value Ref Range Status   SARS Coronavirus 2 by RT PCR POSITIVE (A) NEGATIVE Final    Comment: (NOTE) SARS-CoV-2 target nucleic acids are DETECTED.  The SARS-CoV-2 RNA is generally detectable in upper respiratory specimens during the acute phase of infection. Positive results are indicative of the presence of the identified virus, but do not rule out bacterial infection or co-infection with other pathogens not detected by the test. Clinical  correlation with patient history and other diagnostic information is necessary to determine patient infection status. The expected result is Negative.  Fact Sheet for Patients: BloggerCourse.com  Fact Sheet for Healthcare Providers: SeriousBroker.it  This test is not yet approved or cleared by the [place] FDA and  has been authorized for detection and/or diagnosis of SARS-CoV-2 by FDA under an Emergency Use Authorization (EUA).  This EUA will remain in effect (meaning this test can be used) for the duration of  the COVID-19 declaration under Section 564(b)(1) of the A ct, 21 U.S.C. section 360bbb-3(b)(1), unless the authorization is terminated or revoked sooner.     Influenza A by PCR NEGATIVE NEGATIVE Final   Influenza B by PCR NEGATIVE NEGATIVE Final    Comment: (NOTE) The Xpert Xpress SARS-CoV-2/FLU/RSV plus assay is intended as an aid in the diagnosis of influenza from Nasopharyngeal swab specimens and should not be used as a sole basis for treatment. Nasal washings and aspirates are unacceptable for Xpert Xpress SARS-CoV-2/FLU/RSV testing.  Fact Sheet for Patients: BloggerCourse.com  Fact Sheet for Healthcare Providers: SeriousBroker.it  This test is not yet approved or cleared by the [place] FDA and has been authorized for detection and/or diagnosis of SARS-CoV-2 by FDA under an Emergency Use Authorization (EUA). This EUA will remain in effect (meaning this test can be used) for the duration of the COVID-19 declaration under Section 564(b)(1) of the Act, 21 U.S.C. section 360bbb-3(b)(1), unless the authorization is terminated or revoked.  Performed at [facility], [address] [place], [place] [address]          Radiology Studies: No results found.      Scheduled Meds:  vitamin C  500 mg Oral Daily   carbidopa-levodopa  1 tablet  Oral QHS   cholecalciferol  1,000 Units Oral Daily   clopidogrel  75 mg Oral Daily   cyanocobalamin  1,000 mcg Oral Daily   enoxaparin (LOVENOX) injection  40 mg Subcutaneous Q24H   ferrous sulfate  325 mg Oral BID   guaiFENesin  600 mg Oral BID   latanoprost  1 drop Both Eyes QHS   lidocaine  1 patch Transdermal Q24H   metoprolol succinate  100 mg Oral Daily   pantoprazole  40 mg Oral BID   senna  2 tablet Oral BID   zinc sulfate  220 mg Oral Daily   Continuous Infusions:    Assessment & Plan:   Principal Problem:   COVID-19 virus infection Active Problems:   UTI (urinary tract infection)   Acute kidney injury superimposed on chronic kidney disease (HCC)   Essential hypertension   GERD with esophagitis   COVID-19 virus infection - The patient will be admitted to a medical telemetry bed. - This would explain her respiratory symptoms and generalized weakness. 8/30 continue p.o. Molnupiravir given AKI on ckd F/u inflammatory markers Continue  vitamin C, D3, zinc PT/OT 9/4  completed monupiravir Needs 10 total days of quarantine for snf      UTI (urinary tract infection) Ucx not done Tx with Rocephin    Acute kidney injury superimposed on chronic kidney disease (HCC) - She has AKI superimposed on stage IIIa chronic kidney disease. 8/31 improving slowly Dc ivf since at baseline. 9/4 creatinine at baseline      Essential hypertension Hold diuretics and ARB's with AKI 5/4 BP stable   GERD with esophagitis Continue PPI   DVT prophylaxis: Lovenox Code Status: Partial Family Communication: None at bedside Disposition Plan: SNF Status is: Inpatient Remains inpatient appropriate because: pt was discharged and has appealed .Marland KitchenMarland Kitchen>now PT recommends SNF-pending, needs total 10 days of quarantine/        LOS: 6 days   Time spent: 35 minutes    Lynn Ito, MD [facility] Pager [phone]  If 7PM-7AM, please contact night-coverage 09/02/2022,  8:06 AM

## 2022-09-03 DIAGNOSIS — U071 COVID-19: Secondary | ICD-10-CM | POA: Diagnosis not present

## 2022-09-03 MED ORDER — AMLODIPINE BESYLATE 5 MG PO TABS
5.0000 mg | ORAL_TABLET | Freq: Every day | ORAL | Status: DC
  Administered 2022-09-03 – 2022-09-09 (×7): 5 mg via ORAL
  Filled 2022-09-03 (×7): qty 1

## 2022-09-03 MED ORDER — CARBIDOPA-LEVODOPA 25-100 MG PO TABS: 1.0000 | ORAL_TABLET | Freq: Every day | ORAL | Status: DC

## 2022-09-03 NOTE — Progress Notes (Signed)
PROGRESS NOTE    Sheila Long  GBT:[id number] DOB: [birth year] DOA: 08/27/2022 PCP: Enid Baas, MD    Brief Narrative:  Sheila Long is a 88 y.o. Caucasian female with medical history significant for CHF coronary artery disease, GERD, hypertension, osteoarthritis, peripheral vascular disease, anxiety and sarcoidosis, who presented to the emergency room with acute onset of worsening dyspnea with associated cough productive of clear sputum and occasional wheezing which have been going on for the last week.    COVID-19 PCR came back positive   9/5 no overnight issues  Consultants:    Procedures:   Antimicrobials:      Subjective: Denies shortness of breath or chest pain    Objective: Vitals:   09/02/22 1632 09/02/22 2220 09/03/22 0533 09/03/22 0744  BP: (!) 151/63 (!) 156/81 (!) 161/70 (!) 163/69  Pulse: 67 65 77 66  Resp: 19 18 18  (!) 22  Temp: 98.4 F (36.9 C) (!) 97.4 F (36.3 C) 98.5 F (36.9 C) 98 F (36.7 C)  TempSrc:      SpO2: 98% 100% 94% 100%  Weight:      Height:       No intake or output data in the 24 hours ending 09/03/22 0909  Filed Weights   08/27/22 2137  Weight: 93.9 kg    Examination: Calm, NAD eating breakfast Clear with poor respiratory effort no wheezing  reg s1/s2 no gallop Soft benign +bs Trace edema bilateral Alert and oriented Mood and affect appropriate in current setting   Data Reviewed: I have personally reviewed following labs and imaging studies  CBC: Recent Labs  Lab 08/27/22 2207 08/28/22 0316 08/29/22 0526  WBC 7.1 7.0 6.8  NEUTROABS  --   --  3.3  HGB 10.4* 9.8* 9.2*  HCT 33.7* 32.1* 30.2*  MCV 90.6 90.2 89.6  PLT 221 215 198   Basic Metabolic Panel: Recent Labs  Lab 08/27/22 2207 08/28/22 0316 08/29/22 0526  NA 139 140  --   K 4.6 3.9  --   CL 107 107  --   CO2 25 27  --   GLUCOSE 121* 143*  --   BUN 19 18  --   CREATININE 1.48* 1.38* 1.12*  CALCIUM 9.0 8.5*  --     GFR: Estimated Creatinine Clearance: 32.7 mL/min (A) (by C-G formula based on SCr of 1.12 mg/dL (H)). Liver Function Tests: No results for input(s): "AST", "ALT", "ALKPHOS", "BILITOT", "PROT", "ALBUMIN" in the last 168 hours. No results for input(s): "LIPASE", "AMYLASE" in the last 168 hours. No results for input(s): "AMMONIA" in the last 168 hours. Coagulation Profile: No results for input(s): "INR", "PROTIME" in the last 168 hours. Cardiac Enzymes: No results for input(s): "CKTOTAL", "CKMB", "CKMBINDEX", "TROPONINI" in the last 168 hours. BNP (last 3 results) No results for input(s): "PROBNP" in the last 8760 hours. HbA1C: No results for input(s): "HGBA1C" in the last 72 hours. CBG: No results for input(s): "GLUCAP" in the last 168 hours. Lipid Profile: No results for input(s): "CHOL", "HDL", "LDLCALC", "TRIG", "CHOLHDL", "LDLDIRECT" in the last 72 hours. Thyroid Function Tests: No results for input(s): "TSH", "T4TOTAL", "FREET4", "T3FREE", "THYROIDAB" in the last 72 hours.  Anemia Panel: No results for input(s): "VITAMINB12", "FOLATE", "FERRITIN", "TIBC", "IRON", "RETICCTPCT" in the last 72 hours.  Sepsis Labs: Recent Labs  Lab 08/28/22 0316  PROCALCITON <0.10    Recent Results (from the past 240 hour(s))  Resp Panel by RT-PCR (Flu A&B, Covid) Anterior Nasal Swab     Status: Abnormal  Collection Time: 08/27/22 10:06 PM   Specimen: Anterior Nasal Swab  Result Value Ref Range Status   SARS Coronavirus 2 by RT PCR POSITIVE (A) NEGATIVE Final    Comment: (NOTE) SARS-CoV-2 target nucleic acids are DETECTED.  The SARS-CoV-2 RNA is generally detectable in upper respiratory specimens during the acute phase of infection. Positive results are indicative of the presence of the identified virus, but do not rule out bacterial infection or co-infection with other pathogens not detected by the test. Clinical correlation with patient history and other diagnostic information is  necessary to determine patient infection status. The expected result is Negative.  Fact Sheet for Patients: BloggerCourse.com  Fact Sheet for Healthcare Providers: SeriousBroker.it  This test is not yet approved or cleared by the [place] FDA and  has been authorized for detection and/or diagnosis of SARS-CoV-2 by FDA under an Emergency Use Authorization (EUA).  This EUA will remain in effect (meaning this test can be used) for the duration of  the COVID-19 declaration under Section 564(b)(1) of the A ct, 21 U.S.C. section 360bbb-3(b)(1), unless the authorization is terminated or revoked sooner.     Influenza A by PCR NEGATIVE NEGATIVE Final   Influenza B by PCR NEGATIVE NEGATIVE Final    Comment: (NOTE) The Xpert Xpress SARS-CoV-2/FLU/RSV plus assay is intended as an aid in the diagnosis of influenza from Nasopharyngeal swab specimens and should not be used as a sole basis for treatment. Nasal washings and aspirates are unacceptable for Xpert Xpress SARS-CoV-2/FLU/RSV testing.  Fact Sheet for Patients: BloggerCourse.com  Fact Sheet for Healthcare Providers: SeriousBroker.it  This test is not yet approved or cleared by the [place] FDA and has been authorized for detection and/or diagnosis of SARS-CoV-2 by FDA under an Emergency Use Authorization (EUA). This EUA will remain in effect (meaning this test can be used) for the duration of the COVID-19 declaration under Section 564(b)(1) of the Act, 21 U.S.C. section 360bbb-3(b)(1), unless the authorization is terminated or revoked.  Performed at [facility], [address] [place], [place] [address]          Radiology Studies: No results found.      Scheduled Meds:  vitamin C  500 mg Oral Daily   carbidopa-levodopa  1 tablet Oral QHS   cholecalciferol  1,000 Units Oral Daily   clopidogrel  75  mg Oral Daily   cyanocobalamin  1,000 mcg Oral Daily   enoxaparin (LOVENOX) injection  40 mg Subcutaneous Q24H   ferrous sulfate  325 mg Oral BID   guaiFENesin  600 mg Oral BID   latanoprost  1 drop Both Eyes QHS   lidocaine  1 patch Transdermal Q24H   metoprolol succinate  100 mg Oral Daily   pantoprazole  40 mg Oral BID   senna  2 tablet Oral BID   zinc sulfate  220 mg Oral Daily   Continuous Infusions:    Assessment & Plan:   Principal Problem:   COVID-19 virus infection Active Problems:   UTI (urinary tract infection)   Acute kidney injury superimposed on chronic kidney disease (HCC)   Essential hypertension   GERD with esophagitis   COVID-19 virus infection Treated with Monupiravir given AKI on CKD Inflammatory markers trending down Vit c, D3, zn Needs 10 days of quarantine for SNF Did not want hospice anymore at home       UTI (urinary tract infection) Culture not done Treated with Rocephin    Acute kidney injury superimposed on chronic  kidney disease ([place]) - She has AKI superimposed on stage IIIa chronic kidney disease. 9/5 improved with IV fluids Now at baseline      Essential hypertension Hold diuretics and ARB's with AKI 9/5 continue beta blk Will add amlodipine 5mg  qd   GERD with esophagitis Continue PPI   DVT prophylaxis: Lovenox Code Status: Partial Family Communication: None at bedside Disposition Plan: SNF Status is: Inpatient Remains inpatient appropriate because: pt was discharged and has appealed .Marland KitchenMarland Kitchen>now PT recommends SNF-pending, needs total 10 days of quarantine/        LOS: 7 days   Time spent: 35 minutes    Nolberto Hanlon, MD [facility] Pager [phone]  If 7PM-7AM, please contact night-coverage 09/03/2022, 9:09 AM

## 2022-09-03 NOTE — Progress Notes (Signed)
Physical Therapy Treatment Patient Details Name: Sheila Long MRN: [id number] DOB: September 29, 1924 Today's Date: 09/03/2022   History of Present Illness 88 y.o. female with medical history of CHF coronary artery disease, GERD, hypertension, osteoarthritis, peripheral vascular disease, anxiety and sarcoidosis, who presented to the emergency room with acute onset of worsening dyspnea with associated cough productive of clear sputum and occasional wheezing. COVID-19 PCR came back positive.    PT Comments    Pt pleasant and motivated to work with PT though she did need a lot of cuing, reinforcement and occasional redirection to stay on task and participating.  She was able to ambulate to the door and back in the room but did not feel that she could do much more.  She was able to rise to standing a few times w/o direct assist, good effort with LE strengthening exercises.    Recommendations for follow up therapy are one component of a multi-disciplinary discharge planning process, led by the attending physician.  Recommendations may be updated based on patient status, additional functional criteria and insurance authorization.  Follow Up Recommendations  Skilled nursing-short term rehab (<3 hours/day) Can patient physically be transported by private vehicle: No   Assistance Recommended at Discharge Frequent or constant Supervision/Assistance  Patient can return home with the following     Equipment Recommendations  None recommended by PT    Recommendations for Other Services       Precautions / Restrictions Precautions Precautions: Fall Restrictions Weight Bearing Restrictions: No     Mobility  Bed Mobility Overal bed mobility: Needs Assistance Bed Mobility: Supine to Sit     Supine to sit: Mod assist     General bed mobility comments: Pt showed some effort with getting LEs to EOB but ultimately needed considerable assist to actually get up to sitting    Transfers Overall  transfer level: Needs assistance Equipment used: Rolling walker (2 wheels) Transfers: Sit to/from Stand Sit to Stand: Min guard           General transfer comment: Pt was able to rise to standing from standard height bed and from recliner - each with only minimal cuing and no direct phyiscal assist.    Ambulation/Gait   Gait Distance (Feet): 30 Feet Assistive device: Rolling walker (2 wheels)         General Gait Details: Pt with slow, but steady gait - reliant on walker w/o excessive UE use of overt safety issues.   Stairs             Wheelchair Mobility    Modified Rankin (Stroke Patients Only)       Balance Overall balance assessment: Needs assistance Sitting-balance support: Feet supported Sitting balance-Leahy Scale: Good     Standing balance support: Bilateral upper extremity supported, During functional activity, Reliant on assistive device for balance Standing balance-Leahy Scale: Good Standing balance comment: used walker but not excessively reliant on it                            Cognition Arousal/Alertness: Awake/alert Behavior During Therapy: Restless Overall Cognitive Status: Within Functional Limits for tasks assessed                                          Exercises General Exercises - Lower Extremity Ankle Circles/Pumps: AROM, 10 reps Long Arc Quad: Strengthening, 10  reps Heel Slides: Strengthening, 10 reps Hip ABduction/ADduction: Strengthening, 10 reps Hip Flexion/Marching: Strengthening, 10 reps    General Comments        Pertinent Vitals/Pain Pain Assessment Pain Assessment: No/denies pain    Home Living                          Prior Function            PT Goals (current goals can now be found in the care plan section) Progress towards PT goals: Progressing toward goals    Frequency    Min 2X/week      PT Plan Discharge plan needs to be updated    Co-evaluation               AM-PAC PT "6 Clicks" Mobility   Outcome Measure  Help needed turning from your back to your side while in a flat bed without using bedrails?: A Little Help needed moving from lying on your back to sitting on the side of a flat bed without using bedrails?: A Little Help needed moving to and from a bed to a chair (including a wheelchair)?: A Little Help needed standing up from a chair using your arms (e.g., wheelchair or bedside chair)?: A Little Help needed to walk in hospital room?: A Little Help needed climbing 3-5 steps with a railing? : A Lot 6 Click Score: 17    End of Session   Activity Tolerance: Patient limited by fatigue Patient left: in bed;with call bell/phone within reach;with bed alarm set Nurse Communication: Mobility status PT Visit Diagnosis: Muscle weakness (generalized) (M62.81);Difficulty in walking, not elsewhere classified (R26.2)     Time: 1607-3710 PT Time Calculation (min) (ACUTE ONLY): 35 min  Charges:  $Gait Training: 8-22 mins $Therapeutic Exercise: 8-22 mins                     Malachi Pro, DPT 09/03/2022, 12:14 PM

## 2022-09-03 NOTE — Progress Notes (Signed)
Occupational Therapy Treatment Patient Details Name: Sheila Long MRN: [id number] DOB: 15-May-1924 Today's Date: 09/03/2022   History of present illness 88 y.o. female with medical history of CHF coronary artery disease, GERD, hypertension, osteoarthritis, peripheral vascular disease, anxiety and sarcoidosis, who presented to the emergency room with acute onset of worsening dyspnea with associated cough productive of clear sputum and occasional wheezing. COVID-19 PCR came back positive.   OT comments  Patient pleasant and agreeable to participate in OT services. Patient required min-mod A supine<>sit. Patient was able to ambulate to bathroom with RW with min guard A. Patient required min guard sit<>stand transfer to/from toilet. Max A for LB clothing management and toileting hygiene. She was able to stand at sink without RW to wash hands. Patient was able to ambulate back to bed. Patient left in bed with bed locked, call bed in reach, and all needs met.    Recommendations for follow up therapy are one component of a multi-disciplinary discharge planning process, led by the attending physician.  Recommendations may be updated based on patient status, additional functional criteria and insurance authorization.    Follow Up Recommendations  Home health OT    Assistance Recommended at Discharge Frequent or constant Supervision/Assistance  Patient can return home with the following  A little help with walking and/or transfers;A little help with bathing/dressing/bathroom;Assistance with cooking/housework;Assist for transportation   Equipment Recommendations  Other (comment) (Defer to other venue of care.)    Recommendations for Other Services      Precautions / Restrictions Precautions Precautions: Fall Restrictions Weight Bearing Restrictions: No       Mobility Bed Mobility Overal bed mobility: Needs Assistance Bed Mobility: Supine to Sit, Sit to Supine     Supine to sit: Mod  assist, Min assist Sit to supine: Mod assist   General bed mobility comments: Pt showed effort attempting to lift LE's up while sitting EOB, Mod A from sit>supine.    Transfers Overall transfer level: Modified independent Equipment used: Rolling walker (2 wheels) Transfers: Sit to/from Stand Sit to Stand: Min guard                 Balance Overall balance assessment: Needs assistance Sitting-balance support: Bilateral upper extremity supported, Feet supported Sitting balance-Leahy Scale: Fair     Standing balance support: Bilateral upper extremity supported, During functional activity, Reliant on assistive device for balance Standing balance-Leahy Scale: Good                             ADL either performed or assessed with clinical judgement   ADL Overall ADL's : Needs assistance/impaired                     Lower Body Dressing: Minimal assistance   Toilet Transfer: Min guard   Toileting- Clothing Manipulation and Hygiene: Maximal assistance       Functional mobility during ADLs: Min guard;Rolling walker (2 wheels)      Extremity/Trunk Assessment Upper Extremity Assessment Upper Extremity Assessment: Generalized weakness   Lower Extremity Assessment Lower Extremity Assessment: Generalized weakness        Vision Baseline Vision/History: 1 Wears glasses            Cognition Arousal/Alertness: Awake/alert Behavior During Therapy: WFL for tasks assessed/performed Overall Cognitive Status: Within Functional Limits for tasks assessed  Frequency  Min 2X/week        Progress Toward Goals  OT Goals(current goals can now be found in the care plan section)  Progress towards OT goals: Progressing toward goals  Acute Rehab OT Goals OT Goal Formulation: With patient Time For Goal Achievement: 09/12/22 Potential to Achieve Goals: Good  Plan Frequency remains appropriate        AM-PAC OT "6 Clicks" Daily Activity     Outcome Measure   Help from another person eating meals?: None Help from another person taking care of personal grooming?: A Little Help from another person toileting, which includes using toliet, bedpan, or urinal?: A Lot Help from another person bathing (including washing, rinsing, drying)?: A Lot Help from another person to put on and taking off regular upper body clothing?: A Little Help from another person to put on and taking off regular lower body clothing?: A Lot 6 Click Score: 16    End of Session Equipment Utilized During Treatment: Rolling walker (2 wheels)  OT Visit Diagnosis: Unsteadiness on feet (R26.81);Muscle weakness (generalized) (M62.81)   Activity Tolerance Patient tolerated treatment well   Patient Left in bed;with call bell/phone within reach;with bed alarm set   Nurse Communication          Time: 5449-2010 OT Time Calculation (min): 30 min  Charges: OT General Charges $OT Visit: 1 Visit OT Treatments $Self Care/Home Management : 23-37 mins    Melvie Paglia, OTS 09/03/2022, 3:28 PM

## 2022-09-04 DIAGNOSIS — U071 COVID-19: Secondary | ICD-10-CM | POA: Diagnosis not present

## 2022-09-04 DIAGNOSIS — N179 Acute kidney failure, unspecified: Secondary | ICD-10-CM | POA: Diagnosis not present

## 2022-09-04 DIAGNOSIS — N189 Chronic kidney disease, unspecified: Secondary | ICD-10-CM | POA: Diagnosis not present

## 2022-09-04 DIAGNOSIS — I10 Essential (primary) hypertension: Secondary | ICD-10-CM | POA: Diagnosis not present

## 2022-09-04 LAB — CBC
HCT: 30.9 % — ABNORMAL LOW (ref 36.0–46.0)
Hemoglobin: 9.7 g/dL — ABNORMAL LOW (ref 12.0–15.0)
MCH: 28.1 pg (ref 26.0–34.0)
MCHC: 31.4 g/dL (ref 30.0–36.0)
MCV: 89.6 fL (ref 80.0–100.0)
Platelets: 208 10*3/uL (ref 150–400)
RBC: 3.45 MIL/uL — ABNORMAL LOW (ref 3.87–5.11)
RDW: 19.1 % — ABNORMAL HIGH (ref 11.5–15.5)
WBC: 6 10*3/uL (ref 4.0–10.5)
nRBC: 0 % (ref 0.0–0.2)

## 2022-09-04 LAB — BASIC METABOLIC PANEL
Anion gap: 7 (ref 5–15)
BUN: 20 mg/dL (ref 8–23)
CO2: 25 mmol/L (ref 22–32)
Calcium: 8.9 mg/dL (ref 8.9–10.3)
Chloride: 109 mmol/L (ref 98–111)
Creatinine, Ser: 1.06 mg/dL — ABNORMAL HIGH (ref 0.44–1.00)
GFR, Estimated: 48 mL/min — ABNORMAL LOW (ref >60)
Glucose, Bld: 145 mg/dL — ABNORMAL HIGH (ref 70–99)
Potassium: 3.8 mmol/L (ref 3.5–5.1)
Sodium: 141 mmol/L (ref 135–145)

## 2022-09-04 LAB — GLUCOSE, CAPILLARY: Glucose-Capillary: 121 mg/dL — ABNORMAL HIGH (ref 70–99)

## 2022-09-04 MED ORDER — MECLIZINE HCL 25 MG PO TABS
12.5000 mg | ORAL_TABLET | Freq: Three times a day (TID) | ORAL | Status: DC | PRN
Start: 2022-09-04 — End: 2022-09-09
  Administered 2022-09-04 – 2022-09-09 (×6): 12.5 mg via ORAL
  Filled 2022-09-04 (×7): qty 0.5

## 2022-09-04 NOTE — Plan of Care (Signed)

## 2022-09-04 NOTE — TOC Progression Note (Signed)
Transition of Care [facility]) - Progression Note    Patient Details  Name: Sheila Long MRN: [id number] Date of Birth: 19-Mar-1924  Transition of Care [facility]) CM/SW Contact  Truddie Hidden, RN Phone Number: 09/04/2022, 3:37 PM  Clinical Narrative:    Appeal still in clinical review. Attempt  to contact Malena Catholic per her request. No answer. Messsage left.         Expected Discharge Plan and Services           Expected Discharge Date: 08/30/22                                     Social Determinants of Health (SDOH) Interventions    Readmission Risk Interventions     No data to display

## 2022-09-04 NOTE — Progress Notes (Signed)
PROGRESS NOTE    Sheila Long  WGN:[id number] DOB: Mar 02, 1924 DOA: 08/27/2022 PCP: Enid Baas, MD    Assessment & Plan:   Principal Problem:   COVID-19 virus infection Active Problems:   UTI (urinary tract infection)   Acute kidney injury superimposed on chronic kidney disease (HCC)   Essential hypertension   GERD with esophagitis  Assessment and Plan:  COVID-19 virus infection: completed monupiravir. Needs 10 days quarantine prior to d/c to SNF. Continue on airborne & contact precautions    UTI: completed abx course    AKI on CKDIIIa: Cr is trending down from day prior. Avoid nephrotoxic meds    HTN: continue on metoprolol, amlodipine    GERD: continue on PPI      DVT prophylaxis: lovenox Code Status: partial  Family Communication:  Disposition Plan: d/c to SNF but waiting to complete 10 days of quarantine   Level of care: Med-Surg  Status is: Inpatient Remains inpatient appropriate because: waiting to complete 10 days of quarantine     Consultants:    Procedures:   Antimicrobials:    Subjective: Pt c/o dizziness   Objective: Vitals:   09/03/22 1612 09/03/22 2233 09/04/22 0503 09/04/22 0800  BP: (!) 146/64 (!) 139/53 (!) 149/63 (!) 142/61  Pulse: 76 73 77 73  Resp: 17 18 20 18   Temp: 97.7 F (36.5 C) 98.5 F (36.9 C) 98.1 F (36.7 C) 98 F (36.7 C)  TempSrc:  Oral Oral Oral  SpO2: 99% 95% 99% 100%  Weight:      Height:       No intake or output data in the 24 hours ending 09/04/22 1353 Filed Weights   08/27/22 2137  Weight: 93.9 kg    Examination:  General exam: Appears calm and comfortable  Respiratory system: decreased breath sounds b/l Cardiovascular system: S1 & S2+. No rubs, gallops or clicks.  Gastrointestinal system: Abdomen is nondistended, soft and nontender. Normal bowel sounds heard. Central nervous system: Alert and oriented. Moves all extremities  Psychiatry: Judgement and insight appear normal. Flat  mood and affect     Data Reviewed: I have personally reviewed following labs and imaging studies  CBC: Recent Labs  Lab 08/29/22 0526 09/04/22 0945  WBC 6.8 6.0  NEUTROABS 3.3  --   HGB 9.2* 9.7*  HCT 30.2* 30.9*  MCV 89.6 89.6  PLT 198 208   Basic Metabolic Panel: Recent Labs  Lab 08/29/22 0526 09/04/22 0945  NA  --  141  K  --  3.8  CL  --  109  CO2  --  25  GLUCOSE  --  145*  BUN  --  20  CREATININE 1.12* 1.06*  CALCIUM  --  8.9   GFR: Estimated Creatinine Clearance: 34.5 mL/min (A) (by C-G formula based on SCr of 1.06 mg/dL (H)). Liver Function Tests: No results for input(s): "AST", "ALT", "ALKPHOS", "BILITOT", "PROT", "ALBUMIN" in the last 168 hours. No results for input(s): "LIPASE", "AMYLASE" in the last 168 hours. No results for input(s): "AMMONIA" in the last 168 hours. Coagulation Profile: No results for input(s): "INR", "PROTIME" in the last 168 hours. Cardiac Enzymes: No results for input(s): "CKTOTAL", "CKMB", "CKMBINDEX", "TROPONINI" in the last 168 hours. BNP (last 3 results) No results for input(s): "PROBNP" in the last 8760 hours. HbA1C: No results for input(s): "HGBA1C" in the last 72 hours. CBG: Recent Labs  Lab 09/04/22 0756  GLUCAP 121*   Lipid Profile: No results for input(s): "CHOL", "HDL", "LDLCALC", "TRIG", "CHOLHDL", "LDLDIRECT"  in the last 72 hours. Thyroid Function Tests: No results for input(s): "TSH", "T4TOTAL", "FREET4", "T3FREE", "THYROIDAB" in the last 72 hours. Anemia Panel: No results for input(s): "VITAMINB12", "FOLATE", "FERRITIN", "TIBC", "IRON", "RETICCTPCT" in the last 72 hours. Sepsis Labs: No results for input(s): "PROCALCITON", "LATICACIDVEN" in the last 168 hours.  Recent Results (from the past 240 hour(s))  Resp Panel by RT-PCR (Flu A&B, Covid) Anterior Nasal Swab     Status: Abnormal   Collection Time: 08/27/22 10:06 PM   Specimen: Anterior Nasal Swab  Result Value Ref Range Status   SARS Coronavirus 2 by  RT PCR POSITIVE (A) NEGATIVE Final    Comment: (NOTE) SARS-CoV-2 target nucleic acids are DETECTED.  The SARS-CoV-2 RNA is generally detectable in upper respiratory specimens during the acute phase of infection. Positive results are indicative of the presence of the identified virus, but do not rule out bacterial infection or co-infection with other pathogens not detected by the test. Clinical correlation with patient history and other diagnostic information is necessary to determine patient infection status. The expected result is Negative.  Fact Sheet for Patients: BloggerCourse.com  Fact Sheet for Healthcare Providers: SeriousBroker.it  This test is not yet approved or cleared by the [place] FDA and  has been authorized for detection and/or diagnosis of SARS-CoV-2 by FDA under an Emergency Use Authorization (EUA).  This EUA will remain in effect (meaning this test can be used) for the duration of  the COVID-19 declaration under Section 564(b)(1) of the A ct, 21 U.S.C. section 360bbb-3(b)(1), unless the authorization is terminated or revoked sooner.     Influenza A by PCR NEGATIVE NEGATIVE Final   Influenza B by PCR NEGATIVE NEGATIVE Final    Comment: (NOTE) The Xpert Xpress SARS-CoV-2/FLU/RSV plus assay is intended as an aid in the diagnosis of influenza from Nasopharyngeal swab specimens and should not be used as a sole basis for treatment. Nasal washings and aspirates are unacceptable for Xpert Xpress SARS-CoV-2/FLU/RSV testing.  Fact Sheet for Patients: BloggerCourse.com  Fact Sheet for Healthcare Providers: SeriousBroker.it  This test is not yet approved or cleared by the [place] FDA and has been authorized for detection and/or diagnosis of SARS-CoV-2 by FDA under an Emergency Use Authorization (EUA). This EUA will remain in effect (meaning this test can be  used) for the duration of the COVID-19 declaration under Section 564(b)(1) of the Act, 21 U.S.C. section 360bbb-3(b)(1), unless the authorization is terminated or revoked.  Performed at [facility], [address] [place], [place] [address]          Radiology Studies: No results found.      Scheduled Meds:  amLODipine  5 mg Oral Daily   vitamin C  500 mg Oral Daily   carbidopa-levodopa  1 tablet Oral QHS   cholecalciferol  1,000 Units Oral Daily   clopidogrel  75 mg Oral Daily   cyanocobalamin  1,000 mcg Oral Daily   enoxaparin (LOVENOX) injection  40 mg Subcutaneous Q24H   ferrous sulfate  325 mg Oral BID   guaiFENesin  600 mg Oral BID   latanoprost  1 drop Both Eyes QHS   lidocaine  1 patch Transdermal Q24H   metoprolol succinate  100 mg Oral Daily   pantoprazole  40 mg Oral BID   senna  2 tablet Oral BID   zinc sulfate  220 mg Oral Daily   Continuous Infusions:   LOS: 8 days    Time spent: 30 mins  Charise Killian, MD [facility] Pager [phone]  If 7PM-7AM, please contact night-coverage [email or web address] 09/04/2022, 1:53 PM

## 2022-09-04 NOTE — TOC Progression Note (Signed)
Transition of Care [facility]) - Progression Note    Patient Details  Name: Sheila Long MRN: [id number] Date of Birth: 06/10/1924  Transition of Care [facility]) CM/SW Contact  Truddie Hidden, RN Phone Number: 09/04/2022, 12:53 AM  Clinical Narrative:    Appeals decision in clinical review process via [employer].         Expected Discharge Plan and Services           Expected Discharge Date: 08/30/22                                     Social Determinants of Health (SDOH) Interventions    Readmission Risk Interventions     No data to display

## 2022-09-05 DIAGNOSIS — N189 Chronic kidney disease, unspecified: Secondary | ICD-10-CM | POA: Diagnosis not present

## 2022-09-05 DIAGNOSIS — U071 COVID-19: Secondary | ICD-10-CM | POA: Diagnosis not present

## 2022-09-05 DIAGNOSIS — N179 Acute kidney failure, unspecified: Secondary | ICD-10-CM | POA: Diagnosis not present

## 2022-09-05 DIAGNOSIS — I10 Essential (primary) hypertension: Secondary | ICD-10-CM | POA: Diagnosis not present

## 2022-09-05 LAB — BASIC METABOLIC PANEL
Anion gap: 7 (ref 5–15)
BUN: 21 mg/dL (ref 8–23)
CO2: 27 mmol/L (ref 22–32)
Calcium: 9.1 mg/dL (ref 8.9–10.3)
Chloride: 110 mmol/L (ref 98–111)
Creatinine, Ser: 1.04 mg/dL — ABNORMAL HIGH (ref 0.44–1.00)
GFR, Estimated: 49 mL/min — ABNORMAL LOW (ref >60)
Glucose, Bld: 126 mg/dL — ABNORMAL HIGH (ref 70–99)
Potassium: 3.7 mmol/L (ref 3.5–5.1)
Sodium: 144 mmol/L (ref 135–145)

## 2022-09-05 LAB — CBC
HCT: 32.7 % — ABNORMAL LOW (ref 36.0–46.0)
Hemoglobin: 10.1 g/dL — ABNORMAL LOW (ref 12.0–15.0)
MCH: 28.1 pg (ref 26.0–34.0)
MCHC: 30.9 g/dL (ref 30.0–36.0)
MCV: 91.1 fL (ref 80.0–100.0)
Platelets: 234 10*3/uL (ref 150–400)
RBC: 3.59 MIL/uL — ABNORMAL LOW (ref 3.87–5.11)
RDW: 19.6 % — ABNORMAL HIGH (ref 11.5–15.5)
WBC: 7 10*3/uL (ref 4.0–10.5)
nRBC: 0 % (ref 0.0–0.2)

## 2022-09-05 MED ORDER — FUROSEMIDE 40 MG PO TABS
40.0000 mg | ORAL_TABLET | Freq: Every day | ORAL | Status: DC
  Administered 2022-09-05 – 2022-09-06 (×2): 40 mg via ORAL
  Filled 2022-09-05 (×2): qty 1

## 2022-09-05 NOTE — TOC Progression Note (Addendum)
Transition of Care [facility]) - Progression Note    Patient Details  Name: Sheila Long MRN: [id number] Date of Birth: 05/24/1924  Transition of Care [facility]) CM/SW Contact  Truddie Hidden, RN Phone Number: 09/05/2022, 2:26 PM  Clinical Narrative:    Spoke with patient's niece, Aram Beecham. Advised bed offer was extended. Bed offer accepted. Family advised payor would have to authorize patient stay at facility. Family wants to expedite discharge. Advised family of facility waiting period for COVID + results and needed authorization.   Spoke with Lajean Manes at [facility] to confirm the earliest day patient could come which is 9/9. Saturday admission are accepted.   Contacted [employer] to initiate auth. No answer. Left a message with patient information and reason for call.   Retrieved phone call [facility], Spoke with [facility] @ [phone]. Auth initiated.   Contacted patient's niece to advised of earliest discharge date. Aram Beecham request patient's sister Minda Meo be contacted.   Contacted patient's sister, Minda Meo to advised SNF was pending authorization and COVID waiting period of 9/9.        Expected Discharge Plan and Services           Expected Discharge Date: 08/30/22                                     Social Determinants of Health (SDOH) Interventions    Readmission Risk Interventions     No data to display

## 2022-09-05 NOTE — Progress Notes (Signed)
PROGRESS NOTE    Sheila Long  WGN:[id number] DOB: Jul 10, 1924 DOA: 08/27/2022 PCP: Enid Baas, MD    Assessment & Plan:   Principal Problem:   COVID-19 virus infection Active Problems:   UTI (urinary tract infection)   Acute kidney injury superimposed on chronic kidney disease (HCC)   Essential hypertension   GERD with esophagitis  Assessment and Plan:  COVID-19 virus infection: completed monupiravir. Needs to complete a 10 day quarantine prior to d/c to SNF. Continue on airborne & contact precautions   UTI: completed abx course    AKI on CKDIIIa: Cr is trending down daily    HTN: continue on amlodipine, metoprolol     GERD: continue on PPI   Likely ACD: likely secondary to CKD. No need for a transfusion currently    DVT prophylaxis: lovenox Code Status: partial  Family Communication:  Disposition Plan: d/c to SNF but waiting to complete 10 days of quarantine   Level of care: Med-Surg  Status is: Inpatient Remains inpatient appropriate because: waiting to complete 10 days of quarantine     Consultants:    Procedures:   Antimicrobials:    Subjective: Pt c/o feet swelling   Objective: Vitals:   09/04/22 0800 09/04/22 1549 09/04/22 2256 09/05/22 0423  BP: (!) 142/61 (!) 124/53 (!) 147/69 (!) 144/67  Pulse: 73 71 74 72  Resp: 18 20 19 20   Temp: 98 F (36.7 C) 97.9 F (36.6 C) 98.6 F (37 C) 98.1 F (36.7 C)  TempSrc: Oral   Oral  SpO2: 100% 98% 97% 99%  Weight:      Height:       No intake or output data in the 24 hours ending 09/05/22 0739 Filed Weights   08/27/22 2137  Weight: 93.9 kg    Examination:  General exam: Appears comfortable  Respiratory system: diminished breath sounds b/l. No rales.  Cardiovascular system: S1/S2+. No rubs or gallops Gastrointestinal system: Abd is soft, NT, ND & hypoactive bowel sounds  Central nervous system: Alert and awake. Moves all extremities Psychiatry: Judgement and insight appears  at baseline. Flat mood and affect    Data Reviewed: I have personally reviewed following labs and imaging studies  CBC: Recent Labs  Lab 09/04/22 0945 09/05/22 0528  WBC 6.0 7.0  HGB 9.7* 10.1*  HCT 30.9* 32.7*  MCV 89.6 91.1  PLT 208 234   Basic Metabolic Panel: Recent Labs  Lab 09/04/22 0945 09/05/22 0528  NA 141 144  K 3.8 3.7  CL 109 110  CO2 25 27  GLUCOSE 145* 126*  BUN 20 21  CREATININE 1.06* 1.04*  CALCIUM 8.9 9.1   GFR: Estimated Creatinine Clearance: 35.2 mL/min (A) (by C-G formula based on SCr of 1.04 mg/dL (H)). Liver Function Tests: No results for input(s): "AST", "ALT", "ALKPHOS", "BILITOT", "PROT", "ALBUMIN" in the last 168 hours. No results for input(s): "LIPASE", "AMYLASE" in the last 168 hours. No results for input(s): "AMMONIA" in the last 168 hours. Coagulation Profile: No results for input(s): "INR", "PROTIME" in the last 168 hours. Cardiac Enzymes: No results for input(s): "CKTOTAL", "CKMB", "CKMBINDEX", "TROPONINI" in the last 168 hours. BNP (last 3 results) No results for input(s): "PROBNP" in the last 8760 hours. HbA1C: No results for input(s): "HGBA1C" in the last 72 hours. CBG: Recent Labs  Lab 09/04/22 0756  GLUCAP 121*   Lipid Profile: No results for input(s): "CHOL", "HDL", "LDLCALC", "TRIG", "CHOLHDL", "LDLDIRECT" in the last 72 hours. Thyroid Function Tests: No results for input(s): "TSH", "  T4TOTAL", "FREET4", "T3FREE", "THYROIDAB" in the last 72 hours. Anemia Panel: No results for input(s): "VITAMINB12", "FOLATE", "FERRITIN", "TIBC", "IRON", "RETICCTPCT" in the last 72 hours. Sepsis Labs: No results for input(s): "PROCALCITON", "LATICACIDVEN" in the last 168 hours.  Recent Results (from the past 240 hour(s))  Resp Panel by RT-PCR (Flu A&B, Covid) Anterior Nasal Swab     Status: Abnormal   Collection Time: 08/27/22 10:06 PM   Specimen: Anterior Nasal Swab  Result Value Ref Range Status   SARS Coronavirus 2 by RT PCR  POSITIVE (A) NEGATIVE Final    Comment: (NOTE) SARS-CoV-2 target nucleic acids are DETECTED.  The SARS-CoV-2 RNA is generally detectable in upper respiratory specimens during the acute phase of infection. Positive results are indicative of the presence of the identified virus, but do not rule out bacterial infection or co-infection with other pathogens not detected by the test. Clinical correlation with patient history and other diagnostic information is necessary to determine patient infection status. The expected result is Negative.  Fact Sheet for Patients: BloggerCourse.com  Fact Sheet for Healthcare Providers: SeriousBroker.it  This test is not yet approved or cleared by the [place] FDA and  has been authorized for detection and/or diagnosis of SARS-CoV-2 by FDA under an Emergency Use Authorization (EUA).  This EUA will remain in effect (meaning this test can be used) for the duration of  the COVID-19 declaration under Section 564(b)(1) of the A ct, 21 U.S.C. section 360bbb-3(b)(1), unless the authorization is terminated or revoked sooner.     Influenza A by PCR NEGATIVE NEGATIVE Final   Influenza B by PCR NEGATIVE NEGATIVE Final    Comment: (NOTE) The Xpert Xpress SARS-CoV-2/FLU/RSV plus assay is intended as an aid in the diagnosis of influenza from Nasopharyngeal swab specimens and should not be used as a sole basis for treatment. Nasal washings and aspirates are unacceptable for Xpert Xpress SARS-CoV-2/FLU/RSV testing.  Fact Sheet for Patients: BloggerCourse.com  Fact Sheet for Healthcare Providers: SeriousBroker.it  This test is not yet approved or cleared by the [place] FDA and has been authorized for detection and/or diagnosis of SARS-CoV-2 by FDA under an Emergency Use Authorization (EUA). This EUA will remain in effect (meaning this test can be used)  for the duration of the COVID-19 declaration under Section 564(b)(1) of the Act, 21 U.S.C. section 360bbb-3(b)(1), unless the authorization is terminated or revoked.  Performed at [facility], [address] [place], [place] [address]          Radiology Studies: No results found.      Scheduled Meds:  amLODipine  5 mg Oral Daily   vitamin C  500 mg Oral Daily   carbidopa-levodopa  1 tablet Oral QHS   cholecalciferol  1,000 Units Oral Daily   clopidogrel  75 mg Oral Daily   cyanocobalamin  1,000 mcg Oral Daily   enoxaparin (LOVENOX) injection  40 mg Subcutaneous Q24H   ferrous sulfate  325 mg Oral BID   guaiFENesin  600 mg Oral BID   latanoprost  1 drop Both Eyes QHS   lidocaine  1 patch Transdermal Q24H   metoprolol succinate  100 mg Oral Daily   pantoprazole  40 mg Oral BID   senna  2 tablet Oral BID   zinc sulfate  220 mg Oral Daily   Continuous Infusions:   LOS: 9 days    Time spent: 25 mins     Charise Killian, MD [facility] Pager [phone]  If 7PM-7AM,  please contact night-coverage [email or web address] 09/05/2022, 7:39 AM

## 2022-09-05 NOTE — TOC Progression Note (Signed)
Transition of Care [facility]) - Progression Note    Patient Details  Name: Sheila Long MRN: [id number] Date of Birth: 01-18-1924  Transition of Care [facility]) CM/SW Contact  Truddie Hidden, RN Phone Number: 09/05/2022, 12:28 PM  Clinical Narrative:    No discharge order noted in system Spoke with patient's sister, Sheila Long regarding discharge plan. [name] stated patient was no longer being seen by hospice since admission. Patient and her family wishes to proceed with discharging to a SNF for therapy. Patient prefers [facility] or [facility]. Initial clinical sent to [facility] in [place].   CMA Kathy Allmond notified. She will contact [facility].         Expected Discharge Plan and Services           Expected Discharge Date: 08/30/22                                     Social Determinants of Health (SDOH) Interventions    Readmission Risk Interventions     No data to display

## 2022-09-05 NOTE — Progress Notes (Signed)
Occupational Therapy Treatment Patient Details Name: Sheila Long MRN: [id number] DOB: 11-03-1924 Today's Date: 09/05/2022   History of present illness 88 y.o. female with medical history of CHF coronary artery disease, GERD, hypertension, osteoarthritis, peripheral vascular disease, anxiety and sarcoidosis, who presented to the emergency room with acute onset of worsening dyspnea with associated cough productive of clear sputum and occasional wheezing. COVID-19 PCR came back positive.   OT comments  Upon entering the room, pt supine in bed and agreeable to OT intervention with family present in room. Pt is very hard of hearing and requires increased time to process. Pt needing mod A for supine >sit and close supervision for safety on EOB. Pt reports nausea and dizziness once seated EOB. She stands with mod A and takes several side steps towards the L along EOB. She reports feeling like she can do "no more" and returns to sitting EOB. Max A to return to supine with assistance needed for B LEs and trunk. Please see below for orthostatics taken during session.  RN notified pt is requesting medication for nausea and dizziness.  Sitting EOB: 144/75 Standing: 95/64 Supine:138/63     Recommendations for follow up therapy are one component of a multi-disciplinary discharge planning process, led by the attending physician.  Recommendations may be updated based on patient status, additional functional criteria and insurance authorization.    Follow Up Recommendations  Skilled nursing-short term rehab (<3 hours/day)    Assistance Recommended at Discharge Frequent or constant Supervision/Assistance  Patient can return home with the following  A little help with walking and/or transfers;A little help with bathing/dressing/bathroom;Assistance with cooking/housework;Assist for transportation   Equipment Recommendations  Other (comment) (defer to next venue of care)       Precautions /  Restrictions Precautions Precautions: Fall Restrictions Weight Bearing Restrictions: No       Mobility Bed Mobility Overal bed mobility: Needs Assistance Bed Mobility: Supine to Sit, Sit to Supine     Supine to sit: Mod assist Sit to supine: Max assist   General bed mobility comments: Pt showed effort but needing increased assistance for bed mobility ( B LEs and trunk support)    Transfers Overall transfer level: Needs assistance Equipment used: Rolling walker (2 wheels) Transfers: Sit to/from Stand Sit to Stand: Mod assist           General transfer comment: pt needing mod lifting assistance to stand from standard bed height.     Balance Overall balance assessment: Needs assistance Sitting-balance support: Bilateral upper extremity supported, Feet supported Sitting balance-Leahy Scale: Fair     Standing balance support: Bilateral upper extremity supported, During functional activity, Reliant on assistive device for balance Standing balance-Leahy Scale: Poor Standing balance comment: UE support                           ADL either performed or assessed with clinical judgement    Extremity/Trunk Assessment Upper Extremity Assessment Upper Extremity Assessment: Generalized weakness   Lower Extremity Assessment Lower Extremity Assessment: Generalized weakness        Vision Baseline Vision/History: 1 Wears glasses Patient Visual Report: No change from baseline            Cognition Arousal/Alertness: Awake/alert Behavior During Therapy: WFL for tasks assessed/performed Overall Cognitive Status: Within Functional Limits for tasks assessed  General Comments: Pt is very HOH during session. She follows commands with increased time.                   Pertinent Vitals/ Pain       Pain Assessment Pain Assessment: No/denies pain         Frequency  Min 2X/week        Progress Toward  Goals  OT Goals(current goals can now be found in the care plan section)  Progress towards OT goals: Progressing toward goals  Acute Rehab OT Goals Patient Stated Goal: to get stronger and go to rehab OT Goal Formulation: With patient/family Time For Goal Achievement: 09/12/22 Potential to Achieve Goals: Good  Plan Frequency remains appropriate;Discharge plan needs to be updated       AM-PAC OT "6 Clicks" Daily Activity     Outcome Measure   Help from another person eating meals?: None Help from another person taking care of personal grooming?: A Little Help from another person toileting, which includes using toliet, bedpan, or urinal?: A Lot Help from another person bathing (including washing, rinsing, drying)?: A Lot Help from another person to put on and taking off regular upper body clothing?: A Little Help from another person to put on and taking off regular lower body clothing?: A Lot 6 Click Score: 16    End of Session Equipment Utilized During Treatment: Rolling walker (2 wheels)  OT Visit Diagnosis: Unsteadiness on feet (R26.81);Muscle weakness (generalized) (M62.81)   Activity Tolerance Treatment limited secondary to medical complications (Comment);Other (comment) (Pt reports dizziness, nausea, and orthostatic)   Patient Left in bed;with call bell/phone within reach;with bed alarm set;with family/visitor present   Nurse Communication Mobility status;Other (comment) (Pt is orthostatic and requesting medicaton for nausea and dizziness)        Time: 8527-7824 OT Time Calculation (min): 27 min  Charges: OT General Charges $OT Visit: 1 Visit OT Treatments $Therapeutic Activity: 23-37 mins  Jackquline Denmark, MS, OTR/L , CBIS ascom [phone]  09/05/22, 2:47 PM

## 2022-09-06 DIAGNOSIS — U071 COVID-19: Secondary | ICD-10-CM | POA: Diagnosis not present

## 2022-09-06 DIAGNOSIS — N179 Acute kidney failure, unspecified: Secondary | ICD-10-CM | POA: Diagnosis not present

## 2022-09-06 DIAGNOSIS — I10 Essential (primary) hypertension: Secondary | ICD-10-CM | POA: Diagnosis not present

## 2022-09-06 DIAGNOSIS — N189 Chronic kidney disease, unspecified: Secondary | ICD-10-CM | POA: Diagnosis not present

## 2022-09-06 LAB — BASIC METABOLIC PANEL
Anion gap: 3 — ABNORMAL LOW (ref 5–15)
BUN: 18 mg/dL (ref 8–23)
CO2: 29 mmol/L (ref 22–32)
Calcium: 8.8 mg/dL — ABNORMAL LOW (ref 8.9–10.3)
Chloride: 109 mmol/L (ref 98–111)
Creatinine, Ser: 0.97 mg/dL (ref 0.44–1.00)
GFR, Estimated: 53 mL/min — ABNORMAL LOW (ref >60)
Glucose, Bld: 124 mg/dL — ABNORMAL HIGH (ref 70–99)
Potassium: 3.9 mmol/L (ref 3.5–5.1)
Sodium: 141 mmol/L (ref 135–145)

## 2022-09-06 LAB — CBC
HCT: 30.9 % — ABNORMAL LOW (ref 36.0–46.0)
Hemoglobin: 9.6 g/dL — ABNORMAL LOW (ref 12.0–15.0)
MCH: 28.4 pg (ref 26.0–34.0)
MCHC: 31.1 g/dL (ref 30.0–36.0)
MCV: 91.4 fL (ref 80.0–100.0)
Platelets: 230 10*3/uL (ref 150–400)
RBC: 3.38 MIL/uL — ABNORMAL LOW (ref 3.87–5.11)
RDW: 19.5 % — ABNORMAL HIGH (ref 11.5–15.5)
WBC: 7.8 10*3/uL (ref 4.0–10.5)
nRBC: 0 % (ref 0.0–0.2)

## 2022-09-06 MED ORDER — FUROSEMIDE 20 MG PO TABS
20.0000 mg | ORAL_TABLET | Freq: Every day | ORAL | Status: DC
  Administered 2022-09-07 – 2022-09-09 (×3): 20 mg via ORAL
  Filled 2022-09-06 (×3): qty 1

## 2022-09-06 NOTE — Progress Notes (Signed)
Physical Therapy Treatment Patient Details Name: Sheila Long MRN: [id number] DOB: 1924-12-25 Today's Date: 09/06/2022   History of Present Illness 88 y.o. female with medical history of CHF coronary artery disease, GERD, hypertension, osteoarthritis, peripheral vascular disease, anxiety and sarcoidosis, who presented to the emergency room with acute onset of worsening dyspnea with associated cough productive of clear sputum and occasional wheezing. COVID-19 PCR came back positive.    PT Comments    Pt presents to PT in bed and agreeable to participate in therapy services. Pt was pleasant and motivated to participate during the session and put forth good effort throughout. Pt required physical assistance for all aspects of functional mobility (bed mobility, transfers, ambulation). Pt primarily limited by fatigue and weakness.Pt reported dizziness w sitting, BP similar to supine reading at 137/58 mmHg, further dropped to 110/99 mmHg in standing. Pt struggled w side steps at EOB, further ambulation deferred. Would benefit from skilled PT at SNF to address above deficits in strength, functional mobility, and activity tolerance to promote optimal return to PLOF.    Recommendations for follow up therapy are one component of a multi-disciplinary discharge planning process, led by the attending physician.  Recommendations may be updated based on patient status, additional functional criteria and insurance authorization.  Follow Up Recommendations  Skilled nursing-short term rehab (<3 hours/day) Can patient physically be transported by private vehicle: No   Assistance Recommended at Discharge Frequent or constant Supervision/Assistance  Patient can return home with the following A little help with walking and/or transfers;Help with stairs or ramp for entrance;Assist for transportation;Assistance with cooking/housework;A lot of help with bathing/dressing/bathroom;Two people to help with walking  and/or transfers   Equipment Recommendations  None recommended by PT    Recommendations for Other Services       Precautions / Restrictions Precautions Precautions: Fall Restrictions Weight Bearing Restrictions: No     Mobility  Bed Mobility Overal bed mobility: Needs Assistance Bed Mobility: Supine to Sit, Sit to Supine     Supine to sit: Max assist, +2 for physical assistance Sit to supine: Max assist   General bed mobility comments: Pt showed effort but needing increased assistance for bed mobility ( B LEs and trunk support), significant cueing required for sequencing    Transfers Overall transfer level: Needs assistance Equipment used: Rolling walker (2 wheels) Transfers: Sit to/from Stand Sit to Stand: Mod assist, cueing for hand placement, bed elevated                Ambulation/Gait Ambulation/Gait assistance: Mod assist Gait Distance (Feet): 3 Feet Assistive device: Rolling walker (2 wheels) Gait Pattern/deviations: Step-to pattern, Trunk flexed Gait velocity: decreased     General Gait Details: pt requires physical assistance from rehab staff for RW negotiation during side steps to [facility]   Stairs             Wheelchair Mobility    Modified Rankin (Stroke Patients Only)       Balance Overall balance assessment: Needs assistance Sitting-balance support: Bilateral upper extremity supported, Feet supported Sitting balance-Leahy Scale: Fair     Standing balance support: Bilateral upper extremity supported, During functional activity, Reliant on assistive device for balance Standing balance-Leahy Scale: Poor                              Cognition Arousal/Alertness: Lethargic Behavior During Therapy: WFL for tasks assessed/performed Overall Cognitive Status: Difficult to assess  General Comments: Pt is very HOH during session. She follows commands with increased time. speech 50%  intelligible        Exercises      General Comments        Pertinent Vitals/Pain Pain Assessment Pain Assessment: PAINAD (reports she is in no pain "as long as she doesnt move") Breathing: noisy labored breathing, long periods of hyperventilation, Cheyne-Stokes respirations Negative Vocalization: none Facial Expression: smiling or inexpressive Body Language: relaxed Consolability: no need to console PAINAD Score: 2 Pain Intervention(s): Limited activity within patient's tolerance, Monitored during session, Repositioned    Home Living                          Prior Function            PT Goals (current goals can now be found in the care plan section) Acute Rehab PT Goals Patient Stated Goal: go to rehab to get stronger, then home. PT Goal Formulation: With patient Time For Goal Achievement: 09/11/22 Potential to Achieve Goals: Good Progress towards PT goals: Not progressing toward goals - comment (Pt limited by fatigue and weakness)    Frequency    Min 2X/week      PT Plan Current plan remains appropriate    Co-evaluation              AM-PAC PT "6 Clicks" Mobility   Outcome Measure  Help needed turning from your back to your side while in a flat bed without using bedrails?: A Lot Help needed moving from lying on your back to sitting on the side of a flat bed without using bedrails?: A Lot Help needed moving to and from a bed to a chair (including a wheelchair)?: A Little Help needed standing up from a chair using your arms (e.g., wheelchair or bedside chair)?: A Little Help needed to walk in hospital room?: A Lot Help needed climbing 3-5 steps with a railing? : Total 6 Click Score: 13    End of Session Equipment Utilized During Treatment: Gait belt Activity Tolerance: Patient limited by fatigue Patient left: in bed;with call bell/phone within reach;with bed alarm set   PT Visit Diagnosis: Muscle weakness (generalized) (M62.81);Difficulty  in walking, not elsewhere classified (R26.2)     Time: 4818-5631 PT Time Calculation (min) (ACUTE ONLY): 37 min  Charges:                        Odette Horns MPH, SPT 09/06/22, 3:50 PM

## 2022-09-06 NOTE — TOC Progression Note (Signed)
Transition of Care [facility]) - Progression Note    Patient Details  Name: Sheila Long MRN: [id number] Date of Birth: [birth year]  [facility] Care [facility]) CM/SW Contact  Liliana Cline, LCSW Phone Number: 09/06/2022, 11:33 AM  Clinical Narrative:    Called [facility], spoke to [place]. She stated the Berkley Harvey is currently being reviewed and she will call this CSW when they have a decision.         Expected Discharge Plan and Services           Expected Discharge Date: 08/30/22                                     Social Determinants of Health (SDOH) Interventions    Readmission Risk Interventions     No data to display

## 2022-09-06 NOTE — Progress Notes (Signed)
PROGRESS NOTE    Sheila Long  WJX:[id number] DOB: 27-Dec-1924 DOA: 08/27/2022 PCP: Enid Baas, MD    Assessment & Plan:   Principal Problem:   COVID-19 virus infection Active Problems:   UTI (urinary tract infection)   Acute kidney injury superimposed on chronic kidney disease (HCC)   Essential hypertension   GERD with esophagitis  Assessment and Plan:  COVID-19 virus infection: completed monupiravir. Completed a 10 day quarantine and can be d/c to SNF tomorrow as per CM    UTI: completed abx course    AKI on CKDIIIa: Cr continues to trend down daily    HTN: continue on metoprolol, amlodipine    GERD: continue on PPI   Likely ACD: secondary to CKD. Will transfuse if Hb < 7.0    DVT prophylaxis: lovenox Code Status: partial  Family Communication:  Disposition Plan: medically stable for d/c to SNF. Can d/c to SNF tomorrow as per CM   Level of care: Med-Surg  Status is: Inpatient Remains inpatient appropriate because: medically stable for d/c to SNF. Can d/c to SNF tomorrow as per CM     Consultants:    Procedures:   Antimicrobials:    Subjective: Pt c/o nausea   Objective: Vitals:   09/05/22 2052 09/06/22 0500 09/06/22 0722 09/06/22 0746  BP: (!) 155/72 (!) 143/48 (!) 143/58 (!) 132/52  Pulse: 77 73 78 72  Resp: 17 18 20 17   Temp: 97.8 F (36.6 C) 98 F (36.7 C) (!) 97.5 F (36.4 C) 98 F (36.7 C)  TempSrc: Oral Oral    SpO2: 99% 99% 100% 99%  Weight:      Height:        Intake/Output Summary (Last 24 hours) at 09/06/2022 1548 Last data filed at 09/06/2022 0630 Gross per 24 hour  Intake --  Output 300 ml  Net -300 ml   Filed Weights   08/27/22 2137  Weight: 93.9 kg    Examination:  General exam: Appears calm but uncomfortable  Respiratory system: decreased breath sounds b/l  Cardiovascular system: S1 & S2+. No rubs or gallops  Gastrointestinal system: Abd is soft, NT, ND & normal bowel sounds  Central nervous system:  alert and awake. Moves all extremities  Psychiatry: Judgement and insight appears at baseline. Flat mood and affect.    Data Reviewed: I have personally reviewed following labs and imaging studies  CBC: Recent Labs  Lab 09/04/22 0945 09/05/22 0528 09/06/22 0455  WBC 6.0 7.0 7.8  HGB 9.7* 10.1* 9.6*  HCT 30.9* 32.7* 30.9*  MCV 89.6 91.1 91.4  PLT 208 234 230   Basic Metabolic Panel: Recent Labs  Lab 09/04/22 0945 09/05/22 0528 09/06/22 0455  NA 141 144 141  K 3.8 3.7 3.9  CL 109 110 109  CO2 25 27 29   GLUCOSE 145* 126* 124*  BUN 20 21 18   CREATININE 1.06* 1.04* 0.97  CALCIUM 8.9 9.1 8.8*   GFR: Estimated Creatinine Clearance: 37.7 mL/min (by C-G formula based on SCr of 0.97 mg/dL). Liver Function Tests: No results for input(s): "AST", "ALT", "ALKPHOS", "BILITOT", "PROT", "ALBUMIN" in the last 168 hours. No results for input(s): "LIPASE", "AMYLASE" in the last 168 hours. No results for input(s): "AMMONIA" in the last 168 hours. Coagulation Profile: No results for input(s): "INR", "PROTIME" in the last 168 hours. Cardiac Enzymes: No results for input(s): "CKTOTAL", "CKMB", "CKMBINDEX", "TROPONINI" in the last 168 hours. BNP (last 3 results) No results for input(s): "PROBNP" in the last 8760 hours. HbA1C: No  results for input(s): "HGBA1C" in the last 72 hours. CBG: Recent Labs  Lab 09/04/22 0756  GLUCAP 121*   Lipid Profile: No results for input(s): "CHOL", "HDL", "LDLCALC", "TRIG", "CHOLHDL", "LDLDIRECT" in the last 72 hours. Thyroid Function Tests: No results for input(s): "TSH", "T4TOTAL", "FREET4", "T3FREE", "THYROIDAB" in the last 72 hours. Anemia Panel: No results for input(s): "VITAMINB12", "FOLATE", "FERRITIN", "TIBC", "IRON", "RETICCTPCT" in the last 72 hours. Sepsis Labs: No results for input(s): "PROCALCITON", "LATICACIDVEN" in the last 168 hours.  Recent Results (from the past 240 hour(s))  Resp Panel by RT-PCR (Flu A&B, Covid) Anterior Nasal  Swab     Status: Abnormal   Collection Time: 08/27/22 10:06 PM   Specimen: Anterior Nasal Swab  Result Value Ref Range Status   SARS Coronavirus 2 by RT PCR POSITIVE (A) NEGATIVE Final    Comment: (NOTE) SARS-CoV-2 target nucleic acids are DETECTED.  The SARS-CoV-2 RNA is generally detectable in upper respiratory specimens during the acute phase of infection. Positive results are indicative of the presence of the identified virus, but do not rule out bacterial infection or co-infection with other pathogens not detected by the test. Clinical correlation with patient history and other diagnostic information is necessary to determine patient infection status. The expected result is Negative.  Fact Sheet for Patients: BloggerCourse.com  Fact Sheet for Healthcare Providers: SeriousBroker.it  This test is not yet approved or cleared by the [place] FDA and  has been authorized for detection and/or diagnosis of SARS-CoV-2 by FDA under an Emergency Use Authorization (EUA).  This EUA will remain in effect (meaning this test can be used) for the duration of  the COVID-19 declaration under Section 564(b)(1) of the A ct, 21 U.S.C. section 360bbb-3(b)(1), unless the authorization is terminated or revoked sooner.     Influenza A by PCR NEGATIVE NEGATIVE Final   Influenza B by PCR NEGATIVE NEGATIVE Final    Comment: (NOTE) The Xpert Xpress SARS-CoV-2/FLU/RSV plus assay is intended as an aid in the diagnosis of influenza from Nasopharyngeal swab specimens and should not be used as a sole basis for treatment. Nasal washings and aspirates are unacceptable for Xpert Xpress SARS-CoV-2/FLU/RSV testing.  Fact Sheet for Patients: BloggerCourse.com  Fact Sheet for Healthcare Providers: SeriousBroker.it  This test is not yet approved or cleared by the [place] FDA and has been authorized  for detection and/or diagnosis of SARS-CoV-2 by FDA under an Emergency Use Authorization (EUA). This EUA will remain in effect (meaning this test can be used) for the duration of the COVID-19 declaration under Section 564(b)(1) of the Act, 21 U.S.C. section 360bbb-3(b)(1), unless the authorization is terminated or revoked.  Performed at [facility], [address], [place], [place] [address]          Radiology Studies: No results found.      Scheduled Meds:  amLODipine  5 mg Oral Daily   vitamin C  500 mg Oral Daily   carbidopa-levodopa  1 tablet Oral QHS   cholecalciferol  1,000 Units Oral Daily   clopidogrel  75 mg Oral Daily   cyanocobalamin  1,000 mcg Oral Daily   enoxaparin (LOVENOX) injection  40 mg Subcutaneous Q24H   ferrous sulfate  325 mg Oral BID   furosemide  40 mg Oral Daily   guaiFENesin  600 mg Oral BID   latanoprost  1 drop Both Eyes QHS   lidocaine  1 patch Transdermal Q24H   metoprolol succinate  100 mg Oral Daily   pantoprazole  40 mg Oral BID   senna  2 tablet Oral BID   zinc sulfate  220 mg Oral Daily   Continuous Infusions:   LOS: 10 days    Time spent: 25 mins     Charise Killian, MD [facility] Pager [phone]  If 7PM-7AM, please contact night-coverage [email or web address] 09/06/2022, 3:48 PM

## 2022-09-07 DIAGNOSIS — I10 Essential (primary) hypertension: Secondary | ICD-10-CM | POA: Diagnosis not present

## 2022-09-07 DIAGNOSIS — U071 COVID-19: Secondary | ICD-10-CM | POA: Diagnosis not present

## 2022-09-07 DIAGNOSIS — N189 Chronic kidney disease, unspecified: Secondary | ICD-10-CM | POA: Diagnosis not present

## 2022-09-07 DIAGNOSIS — N179 Acute kidney failure, unspecified: Secondary | ICD-10-CM | POA: Diagnosis not present

## 2022-09-07 LAB — CBC
HCT: 30.4 % — ABNORMAL LOW (ref 36.0–46.0)
Hemoglobin: 9.4 g/dL — ABNORMAL LOW (ref 12.0–15.0)
MCH: 28.2 pg (ref 26.0–34.0)
MCHC: 30.9 g/dL (ref 30.0–36.0)
MCV: 91.3 fL (ref 80.0–100.0)
Platelets: 220 10*3/uL (ref 150–400)
RBC: 3.33 MIL/uL — ABNORMAL LOW (ref 3.87–5.11)
RDW: 19.1 % — ABNORMAL HIGH (ref 11.5–15.5)
WBC: 7.7 10*3/uL (ref 4.0–10.5)
nRBC: 0 % (ref 0.0–0.2)

## 2022-09-07 LAB — BASIC METABOLIC PANEL
Anion gap: 7 (ref 5–15)
BUN: 20 mg/dL (ref 8–23)
CO2: 26 mmol/L (ref 22–32)
Calcium: 8.7 mg/dL — ABNORMAL LOW (ref 8.9–10.3)
Chloride: 105 mmol/L (ref 98–111)
Creatinine, Ser: 1.06 mg/dL — ABNORMAL HIGH (ref 0.44–1.00)
GFR, Estimated: 48 mL/min — ABNORMAL LOW (ref >60)
Glucose, Bld: 118 mg/dL — ABNORMAL HIGH (ref 70–99)
Potassium: 3.9 mmol/L (ref 3.5–5.1)
Sodium: 138 mmol/L (ref 135–145)

## 2022-09-07 NOTE — TOC Progression Note (Signed)
Transition of Care [facility]) - Progression Note    Patient Details  Name: Sheila Long MRN: [id number] Date of Birth: [birth year]  Transition of Care [facility]) CM/SW Contact  Kemper Durie, RN Phone Number: 09/07/2022, 4:08 PM  Clinical Narrative:      Filomena Jungling at [facility], aware authorization received.  They are unable to accept patient today as they will not have any female beds until Monday.  MD aware, anticipate discharge to SNF on Monday.      Expected Discharge Plan and Services           Expected Discharge Date: 08/30/22                                     Social Determinants of Health (SDOH) Interventions    Readmission Risk Interventions     No data to display

## 2022-09-07 NOTE — TOC Progression Note (Addendum)
Transition of Care [facility]) - Progression Note    Patient Details  Name: Sheila Long MRN: [id number] Date of Birth: [birth year]  Transition of Care [facility]) CM/SW Contact  Liliana Cline, LCSW Phone Number: 09/07/2022, 9:02 AM  Clinical Narrative:    Called [facility] and spoke with Debbie. She stated Berkley Harvey is still pending at this time. She will call CSW back when they have a decision. She stated it should be today.  3:43- Call from [place] with [facility]. Patient is approved for SNF [phone] [id number]. Notified assigned RNCM and MD.        Expected Discharge Plan and Services           Expected Discharge Date: 08/30/22                                     Social Determinants of Health (SDOH) Interventions    Readmission Risk Interventions     No data to display

## 2022-09-07 NOTE — Progress Notes (Signed)
PROGRESS NOTE    Sheila Long  VVO:[id number] DOB: [birth year] DOA: 08/27/2022 PCP: Enid Baas, MD    Assessment & Plan:   Principal Problem:   COVID-19 virus infection Active Problems:   UTI (urinary tract infection)   Acute kidney injury superimposed on chronic kidney disease (HCC)   Essential hypertension   GERD with esophagitis  Assessment and Plan:  COVID-19 virus infection: completed monupiravir. Completed 10 day quarantine but still waiting on insurance auth currently    UTI: completed abx course    AKI on CKDIIIa: Cr is labile. Will continue to monitor    HTN: continue on amlodipine, metoprolol    GERD: continue on PPI   Likely ACD: secondary to CKD. No need for a transfusion currently    DVT prophylaxis: lovenox Code Status: partial  Family Communication:  Disposition Plan: medically stable for d/c to SNF. Still waiting on insurance auth today as per CM   Level of care: Med-Surg  Status is: Inpatient Remains inpatient appropriate because: medically stable for d/c to SNF. Still waiting on insurance auth as per CM     Consultants:    Procedures:   Antimicrobials:    Subjective: Pt denies any complaints    Objective: Vitals:   09/06/22 1627 09/06/22 2128 09/07/22 0440 09/07/22 0728  BP: 127/66 139/63 (!) 146/49 (!) 126/39  Pulse: 79 73 69 72  Resp: 20 (!) 22 (!) 22 19  Temp: 98.8 F (37.1 C) (!) 97.4 F (36.3 C) 98.6 F (37 C) 97.7 F (36.5 C)  TempSrc:      SpO2: 96% 96% 99% 99%  Weight:      Height:       No intake or output data in the 24 hours ending 09/07/22 0731  Filed Weights   08/27/22 2137  Weight: 93.9 kg    Examination:  General exam: Appears calm & comfortable   Respiratory system: diminished breath sounds b/l  Cardiovascular system: S1/S2+. No rubs or gallops  Gastrointestinal system: Abd is soft, NT, ND & hypoactive bowel sounds  Central nervous system: alert and awake. Moves all extremities   Psychiatry: Judgement and insight appears at baseline. Flat mood and affect    Data Reviewed: I have personally reviewed following labs and imaging studies  CBC: Recent Labs  Lab 09/04/22 0945 09/05/22 0528 09/06/22 0455 09/07/22 0618  WBC 6.0 7.0 7.8 7.7  HGB 9.7* 10.1* 9.6* 9.4*  HCT 30.9* 32.7* 30.9* 30.4*  MCV 89.6 91.1 91.4 91.3  PLT 208 234 230 220   Basic Metabolic Panel: Recent Labs  Lab 09/04/22 0945 09/05/22 0528 09/06/22 0455 09/07/22 0618  NA 141 144 141 138  K 3.8 3.7 3.9 3.9  CL 109 110 109 105  CO2 25 27 29 26   GLUCOSE 145* 126* 124* 118*  BUN 20 21 18 20   CREATININE 1.06* 1.04* 0.97 1.06*  CALCIUM 8.9 9.1 8.8* 8.7*   GFR: Estimated Creatinine Clearance: 34.5 mL/min (A) (by C-G formula based on SCr of 1.06 mg/dL (H)). Liver Function Tests: No results for input(s): "AST", "ALT", "ALKPHOS", "BILITOT", "PROT", "ALBUMIN" in the last 168 hours. No results for input(s): "LIPASE", "AMYLASE" in the last 168 hours. No results for input(s): "AMMONIA" in the last 168 hours. Coagulation Profile: No results for input(s): "INR", "PROTIME" in the last 168 hours. Cardiac Enzymes: No results for input(s): "CKTOTAL", "CKMB", "CKMBINDEX", "TROPONINI" in the last 168 hours. BNP (last 3 results) No results for input(s): "PROBNP" in the last 8760 hours. HbA1C: No results  for input(s): "HGBA1C" in the last 72 hours. CBG: Recent Labs  Lab 09/04/22 0756  GLUCAP 121*   Lipid Profile: No results for input(s): "CHOL", "HDL", "LDLCALC", "TRIG", "CHOLHDL", "LDLDIRECT" in the last 72 hours. Thyroid Function Tests: No results for input(s): "TSH", "T4TOTAL", "FREET4", "T3FREE", "THYROIDAB" in the last 72 hours. Anemia Panel: No results for input(s): "VITAMINB12", "FOLATE", "FERRITIN", "TIBC", "IRON", "RETICCTPCT" in the last 72 hours. Sepsis Labs: No results for input(s): "PROCALCITON", "LATICACIDVEN" in the last 168 hours.  No results found for this or any previous  visit (from the past 240 hour(s)).        Radiology Studies: No results found.      Scheduled Meds:  amLODipine  5 mg Oral Daily   vitamin C  500 mg Oral Daily   carbidopa-levodopa  1 tablet Oral QHS   cholecalciferol  1,000 Units Oral Daily   clopidogrel  75 mg Oral Daily   cyanocobalamin  1,000 mcg Oral Daily   enoxaparin (LOVENOX) injection  40 mg Subcutaneous Q24H   ferrous sulfate  325 mg Oral BID   furosemide  20 mg Oral Daily   guaiFENesin  600 mg Oral BID   latanoprost  1 drop Both Eyes QHS   lidocaine  1 patch Transdermal Q24H   metoprolol succinate  100 mg Oral Daily   pantoprazole  40 mg Oral BID   senna  2 tablet Oral BID   zinc sulfate  220 mg Oral Daily   Continuous Infusions:   LOS: 11 days    Time spent: 25 mins     Charise Killian, MD [facility] Pager [phone]  If 7PM-7AM, please contact night-coverage [email or web address] 09/07/2022, 7:31 AM

## 2022-09-08 DIAGNOSIS — U071 COVID-19: Secondary | ICD-10-CM | POA: Diagnosis not present

## 2022-09-08 DIAGNOSIS — I10 Essential (primary) hypertension: Secondary | ICD-10-CM | POA: Diagnosis not present

## 2022-09-08 DIAGNOSIS — N189 Chronic kidney disease, unspecified: Secondary | ICD-10-CM | POA: Diagnosis not present

## 2022-09-08 DIAGNOSIS — N179 Acute kidney failure, unspecified: Secondary | ICD-10-CM | POA: Diagnosis not present

## 2022-09-08 LAB — CBC
HCT: 29.5 % — ABNORMAL LOW (ref 36.0–46.0)
Hemoglobin: 9.3 g/dL — ABNORMAL LOW (ref 12.0–15.0)
MCH: 28.6 pg (ref 26.0–34.0)
MCHC: 31.5 g/dL (ref 30.0–36.0)
MCV: 90.8 fL (ref 80.0–100.0)
Platelets: 230 10*3/uL (ref 150–400)
RBC: 3.25 MIL/uL — ABNORMAL LOW (ref 3.87–5.11)
RDW: 19 % — ABNORMAL HIGH (ref 11.5–15.5)
WBC: 7.8 10*3/uL (ref 4.0–10.5)
nRBC: 0 % (ref 0.0–0.2)

## 2022-09-08 LAB — BASIC METABOLIC PANEL
Anion gap: 6 (ref 5–15)
BUN: 23 mg/dL (ref 8–23)
CO2: 29 mmol/L (ref 22–32)
Calcium: 8.7 mg/dL — ABNORMAL LOW (ref 8.9–10.3)
Chloride: 105 mmol/L (ref 98–111)
Creatinine, Ser: 1.09 mg/dL — ABNORMAL HIGH (ref 0.44–1.00)
GFR, Estimated: 46 mL/min — ABNORMAL LOW (ref >60)
Glucose, Bld: 109 mg/dL — ABNORMAL HIGH (ref 70–99)
Potassium: 3.9 mmol/L (ref 3.5–5.1)
Sodium: 140 mmol/L (ref 135–145)

## 2022-09-08 MED ORDER — GUAIFENESIN ER 600 MG PO TB12
1200.0000 mg | ORAL_TABLET | Freq: Two times a day (BID) | ORAL | Status: DC
  Administered 2022-09-08 – 2022-09-09 (×3): 1200 mg via ORAL
  Filled 2022-09-08 (×3): qty 2

## 2022-09-08 MED ORDER — LIDOCAINE 5 % EX PTCH
1.0000 | MEDICATED_PATCH | CUTANEOUS | Status: DC
  Administered 2022-09-08: 1 via TRANSDERMAL
  Filled 2022-09-08 (×2): qty 1

## 2022-09-08 NOTE — Progress Notes (Signed)
PROGRESS NOTE    Sheila Long  NIO:[id number] DOB: 06/12/1924 DOA: 08/27/2022 PCP: Enid Baas, MD    Assessment & Plan:   Principal Problem:   COVID-19 virus infection Active Problems:   UTI (urinary tract infection)   Acute kidney injury superimposed on chronic kidney disease (HCC)   Essential hypertension   GERD with esophagitis  Assessment and Plan:  COVID-19 virus infection: completed monupiravir. Completed 10 day quarantine and d/c to SNF tomorrow as per CM    UTI: completed abx course    AKI on CKDIIIa: Cr is labile. Avoid nephrotoxic meds    HTN: continue on metoprolol, amlodipine   GERD: continue on PPI   Likely ACD: secondary to CKD. H&H are labile    DVT prophylaxis: lovenox Code Status: partial  Family Communication:  Disposition Plan: medically stable for d/c to SNF. Can d/c to SNF tomorrow as per CM   Level of care: Med-Surg  Status is: Inpatient Remains inpatient appropriate because: medically stable for d/c to SNF.Can d/c to SNF tomorrow as per CM    Consultants:    Procedures:   Antimicrobials:    Subjective: Pt c/o difficulty getting up sputum    Objective: Vitals:   09/07/22 0728 09/07/22 1634 09/07/22 2040 09/08/22 0338  BP: (!) 126/39 136/63 (!) 123/57 (!) 119/45  Pulse: 72 71 67 69  Resp: 19 17 20 17   Temp: 97.7 F (36.5 C) 99 F (37.2 C) 98.8 F (37.1 C) 97.7 F (36.5 C)  TempSrc:    Oral  SpO2: 99% 97% 100% 100%  Weight:      Height:       No intake or output data in the 24 hours ending 09/08/22 0739  Filed Weights   08/27/22 2137  Weight: 93.9 kg    Examination:  General exam: Appears comfortable   Respiratory system: decreased breath sounds b/l Cardiovascular system: S1 & S2+. No rubs or clicks  Gastrointestinal system: Abd is soft, NT, ND & normal bowel sounds  Central nervous system: Alert and awake. Moves all extremities  Psychiatry: judgement and insight appears at baseline. Flat mood and  affect    Data Reviewed: I have personally reviewed following labs and imaging studies  CBC: Recent Labs  Lab 09/04/22 0945 09/05/22 0528 09/06/22 0455 09/07/22 0618  WBC 6.0 7.0 7.8 7.7  HGB 9.7* 10.1* 9.6* 9.4*  HCT 30.9* 32.7* 30.9* 30.4*  MCV 89.6 91.1 91.4 91.3  PLT 208 234 230 220   Basic Metabolic Panel: Recent Labs  Lab 09/04/22 0945 09/05/22 0528 09/06/22 0455 09/07/22 0618 09/08/22 0620  NA 141 144 141 138 140  K 3.8 3.7 3.9 3.9 3.9  CL 109 110 109 105 105  CO2 25 27 29 26 29   GLUCOSE 145* 126* 124* 118* 109*  BUN 20 21 18 20 23   CREATININE 1.06* 1.04* 0.97 1.06* 1.09*  CALCIUM 8.9 9.1 8.8* 8.7* 8.7*   GFR: Estimated Creatinine Clearance: 33.5 mL/min (A) (by C-G formula based on SCr of 1.09 mg/dL (H)). Liver Function Tests: No results for input(s): "AST", "ALT", "ALKPHOS", "BILITOT", "PROT", "ALBUMIN" in the last 168 hours. No results for input(s): "LIPASE", "AMYLASE" in the last 168 hours. No results for input(s): "AMMONIA" in the last 168 hours. Coagulation Profile: No results for input(s): "INR", "PROTIME" in the last 168 hours. Cardiac Enzymes: No results for input(s): "CKTOTAL", "CKMB", "CKMBINDEX", "TROPONINI" in the last 168 hours. BNP (last 3 results) No results for input(s): "PROBNP" in the last 8760 hours. HbA1C:  No results for input(s): "HGBA1C" in the last 72 hours. CBG: Recent Labs  Lab 09/04/22 0756  GLUCAP 121*   Lipid Profile: No results for input(s): "CHOL", "HDL", "LDLCALC", "TRIG", "CHOLHDL", "LDLDIRECT" in the last 72 hours. Thyroid Function Tests: No results for input(s): "TSH", "T4TOTAL", "FREET4", "T3FREE", "THYROIDAB" in the last 72 hours. Anemia Panel: No results for input(s): "VITAMINB12", "FOLATE", "FERRITIN", "TIBC", "IRON", "RETICCTPCT" in the last 72 hours. Sepsis Labs: No results for input(s): "PROCALCITON", "LATICACIDVEN" in the last 168 hours.  No results found for this or any previous visit (from the past 240  hour(s)).        Radiology Studies: No results found.      Scheduled Meds:  amLODipine  5 mg Oral Daily   vitamin C  500 mg Oral Daily   carbidopa-levodopa  1 tablet Oral QHS   cholecalciferol  1,000 Units Oral Daily   clopidogrel  75 mg Oral Daily   cyanocobalamin  1,000 mcg Oral Daily   enoxaparin (LOVENOX) injection  40 mg Subcutaneous Q24H   ferrous sulfate  325 mg Oral BID   furosemide  20 mg Oral Daily   guaiFENesin  600 mg Oral BID   latanoprost  1 drop Both Eyes QHS   lidocaine  1 patch Transdermal Q24H   metoprolol succinate  100 mg Oral Daily   pantoprazole  40 mg Oral BID   senna  2 tablet Oral BID   zinc sulfate  220 mg Oral Daily   Continuous Infusions:   LOS: 12 days    Time spent: 25 mins     Charise Killian, MD [facility] Pager [phone]  If 7PM-7AM, please contact night-coverage [email or web address] 09/08/2022, 7:39 AM

## 2022-09-09 DIAGNOSIS — K2100 Gastro-esophageal reflux disease with esophagitis, without bleeding: Secondary | ICD-10-CM | POA: Diagnosis not present

## 2022-09-09 DIAGNOSIS — R443 Hallucinations, unspecified: Secondary | ICD-10-CM | POA: Diagnosis not present

## 2022-09-09 DIAGNOSIS — D649 Anemia, unspecified: Secondary | ICD-10-CM | POA: Diagnosis not present

## 2022-09-09 DIAGNOSIS — G20 Parkinson's disease: Secondary | ICD-10-CM | POA: Diagnosis not present

## 2022-09-09 DIAGNOSIS — G20A1 Parkinson's disease without dyskinesia, without mention of fluctuations: Secondary | ICD-10-CM | POA: Diagnosis not present

## 2022-09-09 DIAGNOSIS — I2510 Atherosclerotic heart disease of native coronary artery without angina pectoris: Secondary | ICD-10-CM | POA: Diagnosis not present

## 2022-09-09 DIAGNOSIS — I10 Essential (primary) hypertension: Secondary | ICD-10-CM | POA: Diagnosis not present

## 2022-09-09 DIAGNOSIS — G250 Essential tremor: Secondary | ICD-10-CM | POA: Diagnosis not present

## 2022-09-09 DIAGNOSIS — I700 Atherosclerosis of aorta: Secondary | ICD-10-CM | POA: Diagnosis not present

## 2022-09-09 DIAGNOSIS — N189 Chronic kidney disease, unspecified: Secondary | ICD-10-CM | POA: Diagnosis not present

## 2022-09-09 DIAGNOSIS — G4700 Insomnia, unspecified: Secondary | ICD-10-CM | POA: Diagnosis not present

## 2022-09-09 DIAGNOSIS — R110 Nausea: Secondary | ICD-10-CM | POA: Diagnosis not present

## 2022-09-09 DIAGNOSIS — Z789 Other specified health status: Secondary | ICD-10-CM | POA: Diagnosis not present

## 2022-09-09 DIAGNOSIS — D513 Other dietary vitamin B12 deficiency anemia: Secondary | ICD-10-CM | POA: Diagnosis not present

## 2022-09-09 DIAGNOSIS — E876 Hypokalemia: Secondary | ICD-10-CM

## 2022-09-09 DIAGNOSIS — U071 COVID-19: Secondary | ICD-10-CM | POA: Diagnosis not present

## 2022-09-09 DIAGNOSIS — Z8616 Personal history of COVID-19: Secondary | ICD-10-CM | POA: Diagnosis not present

## 2022-09-09 DIAGNOSIS — R062 Wheezing: Secondary | ICD-10-CM | POA: Diagnosis not present

## 2022-09-09 DIAGNOSIS — R609 Edema, unspecified: Secondary | ICD-10-CM | POA: Diagnosis not present

## 2022-09-09 DIAGNOSIS — I5031 Acute diastolic (congestive) heart failure: Secondary | ICD-10-CM | POA: Diagnosis not present

## 2022-09-09 DIAGNOSIS — M25511 Pain in right shoulder: Secondary | ICD-10-CM | POA: Diagnosis not present

## 2022-09-09 DIAGNOSIS — M6281 Muscle weakness (generalized): Secondary | ICD-10-CM | POA: Diagnosis not present

## 2022-09-09 DIAGNOSIS — R1110 Vomiting, unspecified: Secondary | ICD-10-CM | POA: Diagnosis not present

## 2022-09-09 DIAGNOSIS — R29898 Other symptoms and signs involving the musculoskeletal system: Secondary | ICD-10-CM | POA: Diagnosis not present

## 2022-09-09 DIAGNOSIS — D869 Sarcoidosis, unspecified: Secondary | ICD-10-CM | POA: Diagnosis not present

## 2022-09-09 DIAGNOSIS — M170 Bilateral primary osteoarthritis of knee: Secondary | ICD-10-CM | POA: Diagnosis not present

## 2022-09-09 DIAGNOSIS — I739 Peripheral vascular disease, unspecified: Secondary | ICD-10-CM | POA: Diagnosis not present

## 2022-09-09 DIAGNOSIS — T7840XA Allergy, unspecified, initial encounter: Secondary | ICD-10-CM | POA: Diagnosis not present

## 2022-09-09 DIAGNOSIS — R0602 Shortness of breath: Secondary | ICD-10-CM | POA: Diagnosis not present

## 2022-09-09 DIAGNOSIS — J029 Acute pharyngitis, unspecified: Secondary | ICD-10-CM | POA: Diagnosis not present

## 2022-09-09 DIAGNOSIS — N179 Acute kidney failure, unspecified: Secondary | ICD-10-CM | POA: Diagnosis not present

## 2022-09-09 DIAGNOSIS — R600 Localized edema: Secondary | ICD-10-CM | POA: Diagnosis not present

## 2022-09-09 DIAGNOSIS — M6259 Muscle wasting and atrophy, not elsewhere classified, multiple sites: Secondary | ICD-10-CM | POA: Diagnosis not present

## 2022-09-09 DIAGNOSIS — R0989 Other specified symptoms and signs involving the circulatory and respiratory systems: Secondary | ICD-10-CM | POA: Diagnosis not present

## 2022-09-09 DIAGNOSIS — G4733 Obstructive sleep apnea (adult) (pediatric): Secondary | ICD-10-CM | POA: Diagnosis not present

## 2022-09-09 DIAGNOSIS — R531 Weakness: Secondary | ICD-10-CM | POA: Diagnosis not present

## 2022-09-09 DIAGNOSIS — N390 Urinary tract infection, site not specified: Secondary | ICD-10-CM | POA: Diagnosis not present

## 2022-09-09 DIAGNOSIS — M109 Gout, unspecified: Secondary | ICD-10-CM | POA: Diagnosis not present

## 2022-09-09 DIAGNOSIS — M79604 Pain in right leg: Secondary | ICD-10-CM | POA: Diagnosis not present

## 2022-09-09 DIAGNOSIS — R2681 Unsteadiness on feet: Secondary | ICD-10-CM | POA: Diagnosis not present

## 2022-09-09 DIAGNOSIS — W19XXXA Unspecified fall, initial encounter: Secondary | ICD-10-CM | POA: Diagnosis not present

## 2022-09-09 DIAGNOSIS — R41841 Cognitive communication deficit: Secondary | ICD-10-CM | POA: Diagnosis not present

## 2022-09-09 DIAGNOSIS — K5900 Constipation, unspecified: Secondary | ICD-10-CM | POA: Diagnosis not present

## 2022-09-09 DIAGNOSIS — H409 Unspecified glaucoma: Secondary | ICD-10-CM | POA: Diagnosis not present

## 2022-09-09 DIAGNOSIS — G629 Polyneuropathy, unspecified: Secondary | ICD-10-CM | POA: Diagnosis not present

## 2022-09-09 DIAGNOSIS — H9190 Unspecified hearing loss, unspecified ear: Secondary | ICD-10-CM | POA: Diagnosis not present

## 2022-09-09 DIAGNOSIS — R5381 Other malaise: Secondary | ICD-10-CM | POA: Diagnosis not present

## 2022-09-09 DIAGNOSIS — Z7401 Bed confinement status: Secondary | ICD-10-CM | POA: Diagnosis not present

## 2022-09-09 DIAGNOSIS — R52 Pain, unspecified: Secondary | ICD-10-CM | POA: Diagnosis not present

## 2022-09-09 DIAGNOSIS — M79605 Pain in left leg: Secondary | ICD-10-CM | POA: Diagnosis not present

## 2022-09-09 DIAGNOSIS — M25551 Pain in right hip: Secondary | ICD-10-CM | POA: Diagnosis not present

## 2022-09-09 DIAGNOSIS — R059 Cough, unspecified: Secondary | ICD-10-CM | POA: Diagnosis not present

## 2022-09-09 DIAGNOSIS — I517 Cardiomegaly: Secondary | ICD-10-CM | POA: Diagnosis not present

## 2022-09-09 LAB — BASIC METABOLIC PANEL
Anion gap: 5 (ref 5–15)
BUN: 21 mg/dL (ref 8–23)
CO2: 27 mmol/L (ref 22–32)
Calcium: 8.5 mg/dL — ABNORMAL LOW (ref 8.9–10.3)
Chloride: 105 mmol/L (ref 98–111)
Creatinine, Ser: 1.03 mg/dL — ABNORMAL HIGH (ref 0.44–1.00)
GFR, Estimated: 50 mL/min — ABNORMAL LOW (ref >60)
Glucose, Bld: 155 mg/dL — ABNORMAL HIGH (ref 70–99)
Potassium: 3.4 mmol/L — ABNORMAL LOW (ref 3.5–5.1)
Sodium: 137 mmol/L (ref 135–145)

## 2022-09-09 LAB — CBC
HCT: 28 % — ABNORMAL LOW (ref 36.0–46.0)
Hemoglobin: 8.8 g/dL — ABNORMAL LOW (ref 12.0–15.0)
MCH: 29 pg (ref 26.0–34.0)
MCHC: 31.4 g/dL (ref 30.0–36.0)
MCV: 92.4 fL (ref 80.0–100.0)
Platelets: 221 10*3/uL (ref 150–400)
RBC: 3.03 MIL/uL — ABNORMAL LOW (ref 3.87–5.11)
RDW: 18.8 % — ABNORMAL HIGH (ref 11.5–15.5)
WBC: 7.7 10*3/uL (ref 4.0–10.5)
nRBC: 0 % (ref 0.0–0.2)

## 2022-09-09 MED ORDER — AMLODIPINE BESYLATE 5 MG PO TABS: 5.0000 mg | ORAL_TABLET | Freq: Every day | ORAL | 0 refills | Status: DC

## 2022-09-09 MED ORDER — POTASSIUM CHLORIDE CRYS ER 20 MEQ PO TBCR
20.0000 meq | EXTENDED_RELEASE_TABLET | Freq: Once | ORAL | Status: AC
Start: 2022-09-09 — End: 2022-09-09
  Administered 2022-09-09: 20 meq via ORAL
  Filled 2022-09-09: qty 1

## 2022-09-09 MED ORDER — OXYCODONE HCL 5 MG PO TABS: 5.0000 mg | ORAL_TABLET | ORAL | 0 refills | Status: AC | PRN

## 2022-09-09 MED ORDER — FUROSEMIDE 20 MG PO TABS: 20.0000 mg | ORAL_TABLET | ORAL | Status: DC

## 2022-09-09 MED ORDER — MECLIZINE HCL 12.5 MG PO TABS: 12.5000 mg | ORAL_TABLET | Freq: Three times a day (TID) | ORAL | 0 refills | Status: AC | PRN

## 2022-09-09 NOTE — Care Management Important Message (Signed)
Important Message  Patient Details  Name: Sheila Long MRN: [id number] Date of Birth: [birth year]   Medicare Important Message Given:  Yes     Bernadette Hoit 09/09/2022, 3:32 PM

## 2022-09-09 NOTE — Discharge Summary (Signed)
Physician Discharge Summary  Sheila Long WCH:[id number] DOB: [birth year] DOA: 08/27/2022  PCP: Enid Baas, MD  Admit date: 08/27/2022 Discharge date: 09/09/2022  Admitted From: home  Disposition:  SNF  Recommendations for Outpatient Follow-up:  Follow up with PCP in 1-2 weeks   Home Health: no  Equipment/Devices:  Discharge Condition: stable  CODE STATUS: partial  Diet recommendation: Heart Healthy   Brief/Interim Summary: HPI was taken from Dr. Arville Care: Sheila Long is a 88 y.o. Caucasian female with medical history significant for CHF coronary artery disease, GERD, hypertension, osteoarthritis, peripheral vascular disease, anxiety and sarcoidosis, who presented to the emergency room with acute onset of worsening dyspnea with associated cough productive of clear sputum and occasional wheezing which have been going on for the last week.  She denied any fever or chills.  No dysuria, oliguria or hematuria urgency or frequency or flank pain.  She has not been feeling any energy.  No chest pain or palpitations.  No nausea or vomiting or diarrhea.  No loss of taste or smell.  She has been constipated.  She denies any abdominal pain or melena or bright red bleeding per rectum.  No other bleeding diathesis.   ED Course: Upon presentation to the emergency room, BP was 158/80 with otherwise normal vital signs.  Labs revealed a creatinine of 1.48 up from 1.22 and BNP of 59 with high sensitive troponin I of 8 twice and CBC showed mild anemia better than previous levels.  TSH was 2.92.  Influenza antigens came back negative.  COVID-19 PCR came back positive EKG as reviewed by me : EKG showed normal sinus rhythm with a rate of 73 with sinus arrhythmia and right bundle branch block Imaging: Portable chest x-ray showed no acute cardiopulmonary disease.   The patient was given a gram of IV Rocephin and 40 mg of IV Lasix with 20 mg of p.o. prednisone.  She will be admitted to a medical  telemetry bed for further evaluation and management.  As per Dr. Marylu Lund: Sheila Long is a 88 y.o. Caucasian female with medical history significant for CHF coronary artery disease, GERD, hypertension, osteoarthritis, peripheral vascular disease, anxiety and sarcoidosis, who presented to the emergency room with acute onset of worsening dyspnea with associated cough productive of clear sputum and occasional wheezing which have been going on for the last week.     COVID-19 PCR came back positive     9/5 no overnight issues    As per Dr. Mayford Knife 9/6-9/11/23: Pt completed the course of monupiravir while inpatient. Pt completed the 10 day quarantine for COVID19. Pt intermittently has some likely COVID19 delirium vs hospital delirium. Of note, pt was found to have UTI and completed the abx course while inpatient as well. PT/OT evaluated pt and recommended SNF. For more information, please see previous progress/consult notes.   Discharge Diagnoses:  Principal Problem:   COVID-19 virus infection Active Problems:   UTI (urinary tract infection)   Acute kidney injury superimposed on chronic kidney disease (HCC)   Essential hypertension   GERD with esophagitis  COVID-19 virus infection: completed monupiravir. Completed 10 day quarantine. Intermittent delirium, secondary to COVID19 vs hospital delirium    UTI: completed abx course    AKI on CKDIIIa: Cr is labile. Avoid nephrotoxic meds   Hypokalemia: potassium given    HTN: continue on metoprolol, amlodipine   GERD: continue on PPI    Likely ACD: secondary to CKD. H&H are labile   Discharge Instructions  Discharge Instructions  Diet - low sodium heart healthy   Complete by: As directed    Discharge instructions   Complete by: As directed    Need to quarantine for total 10 days  Take your first covid antibiotic , Molnupiravir, medication tonight at 22:00   Discharge instructions   Complete by: As directed    F/u w/ PCP  in 1-2 weeks.   Increase activity slowly   Complete by: As directed    Increase activity slowly   Complete by: As directed       Allergies as of 09/09/2022       Reactions   Tramadol Other (See Comments)   CONFUSION   Sulfa Antibiotics    Patient does not recall   Zocor [simvastatin] Other (See Comments)   INTOLERANCE-MYALGIAS        Medication List     STOP taking these medications    olmesartan-hydrochlorothiazide 40-12.5 MG tablet Commonly known as: BENICAR HCT   ondansetron 4 MG disintegrating tablet Commonly known as: ZOFRAN-ODT   prochlorperazine 10 MG tablet Commonly known as: COMPAZINE   spironolactone 25 MG tablet Commonly known as: ALDACTONE       TAKE these medications    amLODipine 5 MG tablet Commonly known as: NORVASC Take 1 tablet (5 mg total) by mouth daily. Start taking on: September 10, 2022   carbidopa-levodopa 25-100 MG tablet Commonly known as: SINEMET IR Take 1 tablet by mouth at bedtime.   clopidogrel 75 MG tablet Commonly known as: PLAVIX Take 75 mg by mouth daily.   cyanocobalamin 1000 MCG tablet Take 1 tablet (1,000 mcg total) by mouth daily.   ferrous sulfate 325 (65 FE) MG EC tablet Take 1 tablet (325 mg total) by mouth 2 (two) times daily.   furosemide 20 MG tablet Commonly known as: LASIX Take 1 tablet (20 mg total) by mouth every other day. What changed:  medication strength how much to take when to take this   haloperidol 5 MG tablet Commonly known as: HALDOL Take 1 tablet (5 mg total) by mouth every 4 (four) hours as needed for up to 3 days for agitation. May crush, mix with water and give sublingually if needed.   ipratropium-albuterol 0.5-2.5 (3) MG/3ML Soln Commonly known as: DUONEB Take 3 mLs by nebulization every 4 (four) hours as needed (shortness of breath.).   latanoprost 0.005 % ophthalmic solution Commonly known as: XALATAN Place 1 drop into both eyes at bedtime.   meclizine 12.5 MG  tablet Commonly known as: ANTIVERT Take 1 tablet (12.5 mg total) by mouth 3 (three) times daily as needed for up to 7 days for dizziness.   metoprolol succinate 100 MG 24 hr tablet Commonly known as: TOPROL-XL Take 1 tablet (100 mg total) by mouth daily. Take with or immediately following a meal.   oxyCODONE 5 MG immediate release tablet Commonly known as: Oxy IR/ROXICODONE Take 1 tablet (5 mg total) by mouth every 4 (four) hours as needed for up to 1 day for moderate pain or severe pain. What changed: reasons to take this   pantoprazole 20 MG tablet Commonly known as: PROTONIX Take 1 tablet by mouth daily. What changed: Another medication with the same name was removed. Continue taking this medication, and follow the directions you see here.   senna 8.6 MG tablet Commonly known as: Senokot Take 2 tablets (17.2 mg total) by mouth 2 (two) times daily. May crush, mix with water and give sublingually if needed.   vitamin D3 25 MCG  tablet Commonly known as: CHOLECALCIFEROL Take 1 tablet (1,000 Units total) by mouth daily.       ASK your doctor about these medications    guaiFENesin 600 MG 12 hr tablet Commonly known as: MUCINEX Take 1 tablet (600 mg total) by mouth 2 (two) times daily for 7 days. Ask about: Should I take this medication?        Contact information for follow-up providers     [facility] Follow up.   Why: Iantha Fallen will follow up to set up services. Contact information: [address] [place] [address] [phone]              Contact information for after-discharge care     Destination     [facility] Preferred SNF .   Service: Skilled Nursing Contact information: [address] [place] [address] [phone]                    Allergies  Allergen Reactions   Tramadol Other (See Comments)    CONFUSION   Sulfa Antibiotics     Patient does not recall   Zocor  [Simvastatin] Other (See Comments)    INTOLERANCE-MYALGIAS     Consultations:    Procedures/Studies: CT HEAD WO CONTRAST ( )  Result Date: 08/31/2022 CLINICAL DATA:  Head trauma, minor (Age >= 65y) EXAM: CT HEAD WITHOUT CONTRAST TECHNIQUE: Contiguous axial images were obtained from the base of the skull through the vertex without intravenous contrast. RADIATION DOSE REDUCTION: This exam was performed according to the departmental dose-optimization program which includes automated exposure control, adjustment of the mA and/or kV according to patient size and/or use of iterative reconstruction technique. COMPARISON:  None Available. FINDINGS: Brain: Normal anatomic configuration. Parenchymal volume loss is commensurate with the patient's age. Mild periventricular white matter changes are present likely reflecting the sequela of small vessel ischemia. No abnormal intra or extra-axial mass lesion or fluid collection. No abnormal mass effect or midline shift. No evidence of acute intracranial hemorrhage or infarct. Ventricular size is normal. Cerebellum unremarkable. Vascular: No asymmetric hyperdense vasculature at the skull base. Moderate atherosclerotic calcification noted within the carotid siphons. Skull: Intact Sinuses/Orbits: There is extensive layering fluid within the visualized left maxillary sinus. Remaining paranasal sinuses are clear. Orbits are unremarkable. Other: Mastoid air cells and middle ear cavities are clear. IMPRESSION: 1. No acute intracranial abnormality. No calvarial fracture. 2. Left maxillary sinus disease. Electronically Signed   By: Helyn Numbers M.D.   On: 08/31/2022 02:59   DG Chest Port 1 View  Result Date: 08/27/2022 CLINICAL DATA:  Weakness for 1 month EXAM: PORTABLE CHEST 1 VIEW COMPARISON:  06/03/2022 FINDINGS: Cardiac shadow is mildly enlarged but stable. Aortic calcifications are seen. Lungs are clear. No acute bony abnormality is noted. IMPRESSION: No active  disease. Electronically Signed   By: Alcide Clever M.D.   On: 08/27/2022 22:45   (Echo, Carotid, EGD, Colonoscopy, ERCP)    Subjective: Pt c/o fatigue    Discharge Exam: Vitals:   09/09/22 0509 09/09/22 0815  BP: 118/62 (!) 120/55  Pulse: 82 77  Resp: 17 17  Temp: 98.4 F (36.9 C) 98 F (36.7 C)  SpO2: 98% 100%   Vitals:   09/08/22 1617 09/08/22 2050 09/09/22 0509 09/09/22 0815  BP: 125/76 (!) 141/44 118/62 (!) 120/55  Pulse: 84 83 82 77  Resp: 20  17 17   Temp: 98.5 F (36.9 C) 97.6 F (36.4 C) 98.4  F (36.9 C) 98 F (36.7 C)  TempSrc:   Oral   SpO2: 100% 98% 98% 100%  Weight:      Height:        General: Pt is alert, awake, not in acute distress Cardiovascular:  S1/S2 +, no rubs, no gallops Respiratory: CTA bilaterally, no wheezing, no rhonchi Abdominal: Soft, NT, ND, bowel sounds + Extremities:  no cyanosis    The results of significant diagnostics from this hospitalization (including imaging, microbiology, ancillary and laboratory) are listed below for reference.     Microbiology: No results found for this or any previous visit (from the past 240 hour(s)).   Labs: BNP (last 3 results) Recent Labs    04/21/22 1832 06/01/22 1105 08/27/22 2207  BNP 385.5* 318.1* 59.0   Basic Metabolic Panel: Recent Labs  Lab 09/05/22 0528 09/06/22 0455 09/07/22 0618 09/08/22 0620 09/09/22 0551  NA 144 141 138 140 137  K 3.7 3.9 3.9 3.9 3.4*  CL 110 109 105 105 105  CO2 27 29 26 29 27   GLUCOSE 126* 124* 118* 109* 155*  BUN 21 18 20 23 21   CREATININE 1.04* 0.97 1.06* 1.09* 1.03*  CALCIUM 9.1 8.8* 8.7* 8.7* 8.5*   Liver Function Tests: No results for input(s): "AST", "ALT", "ALKPHOS", "BILITOT", "PROT", "ALBUMIN" in the last 168 hours. No results for input(s): "LIPASE", "AMYLASE" in the last 168 hours. No results for input(s): "AMMONIA" in the last 168 hours. CBC: Recent Labs  Lab 09/05/22 0528 09/06/22 0455 09/07/22 0618 09/08/22 0620 09/09/22 0551   WBC 7.0 7.8 7.7 7.8 7.7  HGB 10.1* 9.6* 9.4* 9.3* 8.8*  HCT 32.7* 30.9* 30.4* 29.5* 28.0*  MCV 91.1 91.4 91.3 90.8 92.4  PLT 234 230 220 230 221   Cardiac Enzymes: No results for input(s): "CKTOTAL", "CKMB", "CKMBINDEX", "TROPONINI" in the last 168 hours. BNP: Invalid input(s): "POCBNP" CBG: Recent Labs  Lab 09/04/22 0756  GLUCAP 121*   D-Dimer No results for input(s): "DDIMER" in the last 72 hours. Hgb A1c No results for input(s): "HGBA1C" in the last 72 hours. Lipid Profile No results for input(s): "CHOL", "HDL", "LDLCALC", "TRIG", "CHOLHDL", "LDLDIRECT" in the last 72 hours. Thyroid function studies No results for input(s): "TSH", "T4TOTAL", "T3FREE", "THYROIDAB" in the last 72 hours.  Invalid input(s): "FREET3" Anemia work up No results for input(s): "VITAMINB12", "FOLATE", "FERRITIN", "TIBC", "IRON", "RETICCTPCT" in the last 72 hours. Urinalysis    Component Value Date/Time   COLORURINE AMBER (A) 08/27/2022 2206   APPEARANCEUR TURBID (A) 08/27/2022 2206   LABSPEC 1.029 08/27/2022 2206   PHURINE 5.0 08/27/2022 2206   GLUCOSEU NEGATIVE 08/27/2022 2206   HGBUR MODERATE (A) 08/27/2022 2206   BILIRUBINUR NEGATIVE 08/27/2022 2206   KETONESUR NEGATIVE 08/27/2022 2206   PROTEINUR 30 (A) 08/27/2022 2206   NITRITE NEGATIVE 08/27/2022 2206   LEUKOCYTESUR LARGE (A) 08/27/2022 2206   Sepsis Labs Recent Labs  Lab 09/06/22 0455 09/07/22 0618 09/08/22 0620 09/09/22 0551  WBC 7.8 7.7 7.8 7.7   Microbiology No results found for this or any previous visit (from the past 240 hour(s)).   Time coordinating discharge: Over 30 minutes  SIGNED:   11/08/22, MD  [facility] 09/09/2022, 12:16 PM Pager   If 7PM-7AM, please contact night-coverage [email or web address]

## 2022-09-09 NOTE — TOC Progression Note (Addendum)
Transition of Care [facility]) - Progression Note    Patient Details  Name: Sheila Long MRN: [id number] Date of Birth: [birth year]  Transition of Care [facility]) CM/SW Contact  Sheila Hidden, RN Phone Number: 09/09/2022, 9:58 AM  Clinical Narrative:    Sheila Long with Sheila Long from [facility]. Patient can admit to facility today. Sheila Long will follow up with room number and where to call report. MD notified.   Spoke with patient's sister, Sheila Long to advise of discharge to [facility] today.  EMS arranged. Nurse notified of room number and where to call report.   Spoke with Sheila Long patient's niece and her sister Sheila Long to address patient's partial DNR status. Patient is to be resuscitated in the event of an emergency while being transported to [facility].   Nurse notified face sheet and medical necessity forms sent printed to the floor. TOC signing off.         Expected Discharge Plan and Services           Expected Discharge Date: 08/30/22                                     Social Determinants of Health (SDOH) Interventions    Readmission Risk Interventions     No data to display

## 2022-09-10 DIAGNOSIS — M6281 Muscle weakness (generalized): Secondary | ICD-10-CM | POA: Diagnosis not present

## 2022-09-10 DIAGNOSIS — I10 Essential (primary) hypertension: Secondary | ICD-10-CM | POA: Diagnosis not present

## 2022-09-10 DIAGNOSIS — I5031 Acute diastolic (congestive) heart failure: Secondary | ICD-10-CM | POA: Diagnosis not present

## 2022-09-10 DIAGNOSIS — K2100 Gastro-esophageal reflux disease with esophagitis, without bleeding: Secondary | ICD-10-CM | POA: Diagnosis not present

## 2022-09-10 DIAGNOSIS — K5900 Constipation, unspecified: Secondary | ICD-10-CM | POA: Diagnosis not present

## 2022-09-10 DIAGNOSIS — G20 Parkinson's disease: Secondary | ICD-10-CM | POA: Diagnosis not present

## 2022-09-10 DIAGNOSIS — D649 Anemia, unspecified: Secondary | ICD-10-CM | POA: Diagnosis not present

## 2022-09-10 DIAGNOSIS — M109 Gout, unspecified: Secondary | ICD-10-CM | POA: Diagnosis not present

## 2022-09-10 DIAGNOSIS — G4700 Insomnia, unspecified: Secondary | ICD-10-CM | POA: Diagnosis not present

## 2022-09-12 DIAGNOSIS — G4700 Insomnia, unspecified: Secondary | ICD-10-CM | POA: Diagnosis not present

## 2022-09-12 DIAGNOSIS — R110 Nausea: Secondary | ICD-10-CM | POA: Diagnosis not present

## 2022-09-12 DIAGNOSIS — R1110 Vomiting, unspecified: Secondary | ICD-10-CM | POA: Diagnosis not present

## 2022-09-13 DIAGNOSIS — M109 Gout, unspecified: Secondary | ICD-10-CM | POA: Diagnosis not present

## 2022-09-16 DIAGNOSIS — M79604 Pain in right leg: Secondary | ICD-10-CM | POA: Diagnosis not present

## 2022-09-16 DIAGNOSIS — M79605 Pain in left leg: Secondary | ICD-10-CM | POA: Diagnosis not present

## 2022-09-16 DIAGNOSIS — T7840XA Allergy, unspecified, initial encounter: Secondary | ICD-10-CM | POA: Diagnosis not present

## 2022-09-16 DIAGNOSIS — Z789 Other specified health status: Secondary | ICD-10-CM | POA: Diagnosis not present

## 2022-09-16 DIAGNOSIS — R609 Edema, unspecified: Secondary | ICD-10-CM | POA: Diagnosis not present

## 2022-09-17 DIAGNOSIS — T7840XA Allergy, unspecified, initial encounter: Secondary | ICD-10-CM | POA: Diagnosis not present

## 2022-09-17 DIAGNOSIS — J029 Acute pharyngitis, unspecified: Secondary | ICD-10-CM | POA: Diagnosis not present

## 2022-09-18 DIAGNOSIS — R609 Edema, unspecified: Secondary | ICD-10-CM | POA: Diagnosis not present

## 2022-09-18 DIAGNOSIS — G20 Parkinson's disease: Secondary | ICD-10-CM | POA: Diagnosis not present

## 2022-09-18 DIAGNOSIS — R443 Hallucinations, unspecified: Secondary | ICD-10-CM | POA: Diagnosis not present

## 2022-09-19 DIAGNOSIS — R609 Edema, unspecified: Secondary | ICD-10-CM | POA: Diagnosis not present

## 2022-09-19 DIAGNOSIS — I10 Essential (primary) hypertension: Secondary | ICD-10-CM | POA: Diagnosis not present

## 2022-09-19 DIAGNOSIS — G20 Parkinson's disease: Secondary | ICD-10-CM | POA: Diagnosis not present

## 2022-09-19 DIAGNOSIS — K2100 Gastro-esophageal reflux disease with esophagitis, without bleeding: Secondary | ICD-10-CM | POA: Diagnosis not present

## 2022-09-19 DIAGNOSIS — R443 Hallucinations, unspecified: Secondary | ICD-10-CM | POA: Diagnosis not present

## 2022-09-19 DIAGNOSIS — M25511 Pain in right shoulder: Secondary | ICD-10-CM | POA: Diagnosis not present

## 2022-09-20 DIAGNOSIS — I10 Essential (primary) hypertension: Secondary | ICD-10-CM | POA: Diagnosis not present

## 2022-09-20 DIAGNOSIS — M109 Gout, unspecified: Secondary | ICD-10-CM | POA: Diagnosis not present

## 2022-09-23 DIAGNOSIS — R600 Localized edema: Secondary | ICD-10-CM | POA: Diagnosis not present

## 2022-09-23 DIAGNOSIS — I5031 Acute diastolic (congestive) heart failure: Secondary | ICD-10-CM | POA: Diagnosis not present

## 2022-09-23 DIAGNOSIS — M6281 Muscle weakness (generalized): Secondary | ICD-10-CM | POA: Diagnosis not present

## 2022-09-23 DIAGNOSIS — I10 Essential (primary) hypertension: Secondary | ICD-10-CM | POA: Diagnosis not present

## 2022-09-25 DIAGNOSIS — K5900 Constipation, unspecified: Secondary | ICD-10-CM | POA: Diagnosis not present

## 2022-09-25 DIAGNOSIS — G20 Parkinson's disease: Secondary | ICD-10-CM | POA: Diagnosis not present

## 2022-09-25 DIAGNOSIS — G4700 Insomnia, unspecified: Secondary | ICD-10-CM | POA: Diagnosis not present

## 2022-09-27 DIAGNOSIS — M6281 Muscle weakness (generalized): Secondary | ICD-10-CM | POA: Diagnosis not present

## 2022-09-27 DIAGNOSIS — R609 Edema, unspecified: Secondary | ICD-10-CM | POA: Diagnosis not present

## 2022-09-27 DIAGNOSIS — R062 Wheezing: Secondary | ICD-10-CM | POA: Diagnosis not present

## 2022-09-27 DIAGNOSIS — R0602 Shortness of breath: Secondary | ICD-10-CM | POA: Diagnosis not present

## 2022-09-29 DIAGNOSIS — R1110 Vomiting, unspecified: Secondary | ICD-10-CM | POA: Diagnosis not present

## 2022-09-29 DIAGNOSIS — I10 Essential (primary) hypertension: Secondary | ICD-10-CM | POA: Diagnosis not present

## 2022-09-29 DIAGNOSIS — H9190 Unspecified hearing loss, unspecified ear: Secondary | ICD-10-CM | POA: Diagnosis not present

## 2022-09-29 DIAGNOSIS — I5031 Acute diastolic (congestive) heart failure: Secondary | ICD-10-CM | POA: Diagnosis not present

## 2022-09-29 DIAGNOSIS — H409 Unspecified glaucoma: Secondary | ICD-10-CM | POA: Diagnosis not present

## 2022-09-29 DIAGNOSIS — I517 Cardiomegaly: Secondary | ICD-10-CM | POA: Diagnosis not present

## 2022-09-29 DIAGNOSIS — I739 Peripheral vascular disease, unspecified: Secondary | ICD-10-CM | POA: Diagnosis not present

## 2022-09-29 DIAGNOSIS — R110 Nausea: Secondary | ICD-10-CM | POA: Diagnosis not present

## 2022-09-29 DIAGNOSIS — R0989 Other specified symptoms and signs involving the circulatory and respiratory systems: Secondary | ICD-10-CM | POA: Diagnosis not present

## 2022-09-29 DIAGNOSIS — Z8616 Personal history of COVID-19: Secondary | ICD-10-CM | POA: Diagnosis not present

## 2022-09-29 DIAGNOSIS — R059 Cough, unspecified: Secondary | ICD-10-CM | POA: Diagnosis not present

## 2022-09-29 DIAGNOSIS — N390 Urinary tract infection, site not specified: Secondary | ICD-10-CM | POA: Diagnosis not present

## 2022-09-29 DIAGNOSIS — D513 Other dietary vitamin B12 deficiency anemia: Secondary | ICD-10-CM | POA: Diagnosis not present

## 2022-09-29 DIAGNOSIS — M6259 Muscle wasting and atrophy, not elsewhere classified, multiple sites: Secondary | ICD-10-CM | POA: Diagnosis not present

## 2022-09-29 DIAGNOSIS — M170 Bilateral primary osteoarthritis of knee: Secondary | ICD-10-CM | POA: Diagnosis not present

## 2022-09-29 DIAGNOSIS — G20A1 Parkinson's disease without dyskinesia, without mention of fluctuations: Secondary | ICD-10-CM | POA: Diagnosis not present

## 2022-09-29 DIAGNOSIS — M6281 Muscle weakness (generalized): Secondary | ICD-10-CM | POA: Diagnosis not present

## 2022-09-29 DIAGNOSIS — I700 Atherosclerosis of aorta: Secondary | ICD-10-CM | POA: Diagnosis not present

## 2022-09-29 DIAGNOSIS — K2100 Gastro-esophageal reflux disease with esophagitis, without bleeding: Secondary | ICD-10-CM | POA: Diagnosis not present

## 2022-09-29 DIAGNOSIS — N189 Chronic kidney disease, unspecified: Secondary | ICD-10-CM | POA: Diagnosis not present

## 2022-09-29 DIAGNOSIS — D869 Sarcoidosis, unspecified: Secondary | ICD-10-CM | POA: Diagnosis not present

## 2022-09-29 DIAGNOSIS — I2510 Atherosclerotic heart disease of native coronary artery without angina pectoris: Secondary | ICD-10-CM | POA: Diagnosis not present

## 2022-09-29 DIAGNOSIS — R41841 Cognitive communication deficit: Secondary | ICD-10-CM | POA: Diagnosis not present

## 2022-09-29 DIAGNOSIS — M25551 Pain in right hip: Secondary | ICD-10-CM | POA: Diagnosis not present

## 2022-09-29 DIAGNOSIS — R609 Edema, unspecified: Secondary | ICD-10-CM | POA: Diagnosis not present

## 2022-09-29 DIAGNOSIS — N179 Acute kidney failure, unspecified: Secondary | ICD-10-CM | POA: Diagnosis not present

## 2022-09-29 DIAGNOSIS — G629 Polyneuropathy, unspecified: Secondary | ICD-10-CM | POA: Diagnosis not present

## 2022-09-29 DIAGNOSIS — R531 Weakness: Secondary | ICD-10-CM | POA: Diagnosis not present

## 2022-09-29 DIAGNOSIS — M109 Gout, unspecified: Secondary | ICD-10-CM | POA: Diagnosis not present

## 2022-09-29 DIAGNOSIS — R2681 Unsteadiness on feet: Secondary | ICD-10-CM | POA: Diagnosis not present

## 2022-09-29 DIAGNOSIS — G4700 Insomnia, unspecified: Secondary | ICD-10-CM | POA: Diagnosis not present

## 2022-09-29 DIAGNOSIS — G4733 Obstructive sleep apnea (adult) (pediatric): Secondary | ICD-10-CM | POA: Diagnosis not present

## 2022-09-29 DIAGNOSIS — R52 Pain, unspecified: Secondary | ICD-10-CM | POA: Diagnosis not present

## 2022-09-30 DIAGNOSIS — K2100 Gastro-esophageal reflux disease with esophagitis, without bleeding: Secondary | ICD-10-CM | POA: Diagnosis not present

## 2022-09-30 DIAGNOSIS — I517 Cardiomegaly: Secondary | ICD-10-CM | POA: Diagnosis not present

## 2022-09-30 DIAGNOSIS — R110 Nausea: Secondary | ICD-10-CM | POA: Diagnosis not present

## 2022-10-01 DIAGNOSIS — I10 Essential (primary) hypertension: Secondary | ICD-10-CM | POA: Diagnosis not present

## 2022-10-01 DIAGNOSIS — R52 Pain, unspecified: Secondary | ICD-10-CM | POA: Diagnosis not present

## 2022-10-02 DIAGNOSIS — R110 Nausea: Secondary | ICD-10-CM | POA: Diagnosis not present

## 2022-10-03 DIAGNOSIS — R609 Edema, unspecified: Secondary | ICD-10-CM | POA: Diagnosis not present

## 2022-10-04 DIAGNOSIS — M6281 Muscle weakness (generalized): Secondary | ICD-10-CM | POA: Diagnosis not present

## 2022-10-04 DIAGNOSIS — G4700 Insomnia, unspecified: Secondary | ICD-10-CM | POA: Diagnosis not present

## 2022-10-04 DIAGNOSIS — R110 Nausea: Secondary | ICD-10-CM | POA: Diagnosis not present

## 2022-10-07 DIAGNOSIS — R609 Edema, unspecified: Secondary | ICD-10-CM | POA: Diagnosis not present

## 2022-10-07 DIAGNOSIS — R059 Cough, unspecified: Secondary | ICD-10-CM | POA: Diagnosis not present

## 2022-10-07 DIAGNOSIS — R0989 Other specified symptoms and signs involving the circulatory and respiratory systems: Secondary | ICD-10-CM | POA: Diagnosis not present

## 2022-10-08 DIAGNOSIS — R609 Edema, unspecified: Secondary | ICD-10-CM | POA: Diagnosis not present

## 2022-10-08 DIAGNOSIS — M6281 Muscle weakness (generalized): Secondary | ICD-10-CM | POA: Diagnosis not present

## 2022-10-08 DIAGNOSIS — G4700 Insomnia, unspecified: Secondary | ICD-10-CM | POA: Diagnosis not present

## 2022-10-08 DIAGNOSIS — I10 Essential (primary) hypertension: Secondary | ICD-10-CM | POA: Diagnosis not present

## 2022-10-10 DIAGNOSIS — M25551 Pain in right hip: Secondary | ICD-10-CM | POA: Diagnosis not present

## 2022-10-10 DIAGNOSIS — R110 Nausea: Secondary | ICD-10-CM | POA: Diagnosis not present

## 2022-10-10 DIAGNOSIS — R1110 Vomiting, unspecified: Secondary | ICD-10-CM | POA: Diagnosis not present

## 2022-10-11 DIAGNOSIS — M6281 Muscle weakness (generalized): Secondary | ICD-10-CM | POA: Diagnosis not present

## 2022-10-11 DIAGNOSIS — I5031 Acute diastolic (congestive) heart failure: Secondary | ICD-10-CM | POA: Diagnosis not present

## 2022-10-11 DIAGNOSIS — R609 Edema, unspecified: Secondary | ICD-10-CM | POA: Diagnosis not present

## 2022-10-11 DIAGNOSIS — G4700 Insomnia, unspecified: Secondary | ICD-10-CM | POA: Diagnosis not present

## 2022-10-11 DIAGNOSIS — K2100 Gastro-esophageal reflux disease with esophagitis, without bleeding: Secondary | ICD-10-CM | POA: Diagnosis not present

## 2022-10-22 ENCOUNTER — Other Ambulatory Visit (INDEPENDENT_AMBULATORY_CARE_PROVIDER_SITE_OTHER): Payer: Self-pay | Admitting: Vascular Surgery

## 2022-10-22 DIAGNOSIS — R0602 Shortness of breath: Secondary | ICD-10-CM | POA: Diagnosis not present

## 2022-10-22 DIAGNOSIS — I10 Essential (primary) hypertension: Secondary | ICD-10-CM | POA: Diagnosis not present

## 2022-10-22 DIAGNOSIS — I5023 Acute on chronic systolic (congestive) heart failure: Secondary | ICD-10-CM | POA: Diagnosis not present

## 2022-10-22 DIAGNOSIS — J42 Unspecified chronic bronchitis: Secondary | ICD-10-CM | POA: Diagnosis not present

## 2022-10-22 DIAGNOSIS — Z09 Encounter for follow-up examination after completed treatment for conditions other than malignant neoplasm: Secondary | ICD-10-CM | POA: Diagnosis not present

## 2022-10-22 DIAGNOSIS — M4807 Spinal stenosis, lumbosacral region: Secondary | ICD-10-CM | POA: Diagnosis not present

## 2022-10-22 DIAGNOSIS — I739 Peripheral vascular disease, unspecified: Secondary | ICD-10-CM

## 2022-10-22 NOTE — Progress Notes (Deleted)
MRN : [id number]  Sheila Long is a 88 y.o. (1924-06-17) female who presents with chief complaint of check circulation.  History of Present Illness:   The patient returns to the office for followup and review of the noninvasive studies.   There have been no interval changes in lower extremity symptoms. No interval shortening of the patient's claudication distance or development of rest pain symptoms. No new ulcers or wounds have occurred since the last visit.  There have been no significant changes to the patient's overall health care.  The patient denies amaurosis fugax or recent TIA symptoms. There are no documented recent neurological changes noted. There is no history of DVT, PE or superficial thrombophlebitis. The patient denies recent episodes of angina or shortness of breath.   ABI Rt=*** and Lt=***  (previous ABI's Rt= 0.95 and Lt=0.53)   Duplex ultrasound of the ***   No outpatient medications have been marked as taking for the 10/24/22 encounter (Appointment) with Delana Meyer, Dolores Lory, MD.    Past Medical History:  Diagnosis Date   Allergic rhinitis due to pollen    Anxiety    Aortic atherosclerosis ([place])    CHF (congestive heart failure) ([place])    Coronary artery disease    Stent in 2000's ([place])   Dyspnea    GERD (gastroesophageal reflux disease)    Hard of hearing    Hypertension    Neuropathy    Obstructive sleep apnea    Osteoarthritis of both knees    Peripheral vascular disease ([place])    Sarcoidosis    Thyroid nodule     Past Surgical History:  Procedure Laterality Date   ABDOMINAL HYSTERECTOMY     BIOPSY THYROID  2014   Dr Pryor Ochoa   BREAST EXCISIONAL BIOPSY Left 1980s   surgical bx neg   BREAST EXCISIONAL BIOPSY Left 1980s   benign   CARDIOVASCULAR STRESS TEST  05/12/2012   with dobutamine---negative. Normal LV function   CORONARY ANGIOPLASTY  2006   ESOPHAGOGASTRODUODENOSCOPY (EGD) WITH PROPOFOL N/A 04/03/2020   Procedure:  ESOPHAGOGASTRODUODENOSCOPY (EGD) WITH PROPOFOL;  Surgeon: Jonathon Bellows, MD;  Location: [facility] ENDOSCOPY;  Service: Gastroenterology;  Laterality: N/A;   JOINT REPLACEMENT Left 2000   hip   JOINT REPLACEMENT Right 2007   hip   KNEE ARTHROPLASTY Right 06/23/2017   Procedure: COMPUTER ASSISTED TOTAL KNEE ARTHROPLASTY;  Surgeon: Dereck Leep, MD;  Location: [facility] ORS;  Service: Orthopedics;  Laterality: Right;    Social History Social History   Tobacco Use   Smoking status: Never   Smokeless tobacco: Never  Vaping Use   Vaping Use: Never used  Substance Use Topics   Alcohol use: No   Drug use: No    Family History Family History  Problem Relation Age of Onset   Cancer Sister 24       unknown - groin area   Heart attack Brother    Cancer Sister 54       pancreatic    Heart attack Sister    Heart attack Brother     Allergies  Allergen Reactions   Tramadol Other (See Comments)    CONFUSION   Sulfa Antibiotics     Patient does not recall   Zocor [Simvastatin] Other (See Comments)    INTOLERANCE-MYALGIAS      REVIEW OF SYSTEMS (Negative unless checked)  Constitutional: [] Weight loss  [] Fever  [] Chills Cardiac: [] Chest pain   []   Chest pressure   [] Palpitations   [] [name] when laying flat   [] [name] with exertion. Vascular:  [x] Pain in legs with walking   [] Pain in legs at rest  [] History of DVT   [] Phlebitis   [] Swelling in legs   [] Varicose veins   [] Non-healing ulcers Pulmonary:   [] Uses home oxygen   [] Productive cough   [] Hemoptysis   [] Wheeze  [] COPD   [] Asthma Neurologic:  [] Dizziness   [] Seizures   [] History of stroke   [] History of TIA  [] Aphasia   [] Vissual changes   [] Weakness or numbness in arm   [] Weakness or numbness in leg Musculoskeletal:   [] Joint swelling   [] Joint pain   [] Low back pain Hematologic:  [] Easy bruising  [] Easy bleeding   [] Hypercoagulable state   [] Anemic Gastrointestinal:  [] Diarrhea   [] Vomiting  [] Gastroesophageal  reflux/heartburn   [] Difficulty swallowing. Genitourinary:  [] Chronic kidney disease   [] Difficult urination  [] Frequent urination   [] Blood in urine Skin:  [] Rashes   [] Ulcers  Psychological:  [] History of anxiety   []  History of major depression.  Physical Examination  There were no vitals filed for this visit. There is no height or weight on file to calculate BMI. Gen: WD/WN, NAD Head: [place]/AT, No temporalis wasting.  Ear/Nose/Throat: Hearing grossly intact, nares w/o erythema or drainage Eyes: PER, EOMI, sclera nonicteric.  Neck: Supple, no masses.  No bruit or JVD.  Pulmonary:  Good air movement, no audible wheezing, no use of accessory muscles.  Cardiac: RRR, normal S1, S2, no Murmurs. Vascular:  mild trophic changes, no open wounds Vessel Right Left  Radial Palpable Palpable  PT Not Palpable Not Palpable  DP Not Palpable Not Palpable  Gastrointestinal: soft, non-distended. No guarding/no peritoneal signs.  Musculoskeletal: M/S 5/5 throughout.  No visible deformity.  Neurologic: CN 2-12 intact. Pain and light touch intact in extremities.  Symmetrical.  Speech is fluent. Motor exam as listed above. Psychiatric: Judgment intact, Mood & affect appropriate for pt's clinical situation. Dermatologic: No rashes or ulcers noted.  No changes consistent with cellulitis.   CBC Lab Results  Component Value Date   WBC 7.7 09/09/2022   HGB 8.8 (L) 09/09/2022   HCT 28.0 (L) 09/09/2022   MCV 92.4 09/09/2022   PLT 221 09/09/2022    BMET    Component Value Date/Time   NA 137 09/09/2022 0551   NA 141 03/23/2015 0842   K 3.4 (L) 09/09/2022 0551   K 4.0 03/23/2015 0842   CL 105 09/09/2022 0551   CL 107 03/23/2015 0842   CO2 27 09/09/2022 0551   CO2 26 03/23/2015 0842   GLUCOSE 155 (H) 09/09/2022 0551   GLUCOSE 126 (H) 03/23/2015 0842   BUN 21 09/09/2022 0551   BUN 17 03/23/2015 0842   CREATININE 1.03 (H) 09/09/2022 0551   CREATININE 1.05 (H) 03/23/2015 0842   CALCIUM 8.5 (L)  09/09/2022 0551   CALCIUM 9.2 03/23/2015 0842   GFRNONAA 50 (L) 09/09/2022 0551   GFRNONAA 47 (L) 03/23/2015 0842   GFRAA 42 (L) 07/08/2020 1143   GFRAA 55 (L) 03/23/2015 0842   CrCl cannot be calculated (Patient's most recent lab result is older than the maximum 21 days allowed.).  COAG Lab Results  Component Value Date   INR 1.1 04/22/2022   INR 0.9 11/17/2020   INR 0.96 12/13/2018    Radiology No results found.   Assessment/Plan There are no diagnoses linked to this encounter.   Hortencia Pilar, MD  10/22/2022 9:21 PM

## 2022-10-24 ENCOUNTER — Encounter (INDEPENDENT_AMBULATORY_CARE_PROVIDER_SITE_OTHER): Payer: PPO

## 2022-10-24 ENCOUNTER — Ambulatory Visit (INDEPENDENT_AMBULATORY_CARE_PROVIDER_SITE_OTHER): Payer: PPO | Admitting: Vascular Surgery

## 2022-10-24 DIAGNOSIS — I6521 Occlusion and stenosis of right carotid artery: Secondary | ICD-10-CM

## 2022-10-24 DIAGNOSIS — I739 Peripheral vascular disease, unspecified: Secondary | ICD-10-CM

## 2022-10-24 DIAGNOSIS — K219 Gastro-esophageal reflux disease without esophagitis: Secondary | ICD-10-CM

## 2022-10-24 DIAGNOSIS — I10 Essential (primary) hypertension: Secondary | ICD-10-CM

## 2022-10-24 DIAGNOSIS — E785 Hyperlipidemia, unspecified: Secondary | ICD-10-CM

## 2022-10-24 DIAGNOSIS — I2510 Atherosclerotic heart disease of native coronary artery without angina pectoris: Secondary | ICD-10-CM

## 2022-11-06 DIAGNOSIS — D649 Anemia, unspecified: Secondary | ICD-10-CM | POA: Diagnosis not present

## 2022-11-06 DIAGNOSIS — N1832 Chronic kidney disease, stage 3b: Secondary | ICD-10-CM | POA: Diagnosis not present

## 2022-11-06 DIAGNOSIS — I5023 Acute on chronic systolic (congestive) heart failure: Secondary | ICD-10-CM | POA: Diagnosis not present

## 2022-11-06 DIAGNOSIS — I10 Essential (primary) hypertension: Secondary | ICD-10-CM | POA: Diagnosis not present

## 2022-11-06 DIAGNOSIS — R7303 Prediabetes: Secondary | ICD-10-CM | POA: Diagnosis not present

## 2022-11-06 DIAGNOSIS — M4807 Spinal stenosis, lumbosacral region: Secondary | ICD-10-CM | POA: Diagnosis not present

## 2022-11-06 DIAGNOSIS — E538 Deficiency of other specified B group vitamins: Secondary | ICD-10-CM | POA: Diagnosis not present

## 2022-11-15 DIAGNOSIS — H6123 Impacted cerumen, bilateral: Secondary | ICD-10-CM | POA: Diagnosis not present

## 2022-11-15 DIAGNOSIS — R1314 Dysphagia, pharyngoesophageal phase: Secondary | ICD-10-CM | POA: Diagnosis not present

## 2022-11-15 DIAGNOSIS — J300 Vasomotor rhinitis: Secondary | ICD-10-CM | POA: Diagnosis not present

## 2022-11-26 ENCOUNTER — Other Ambulatory Visit: Payer: Self-pay | Admitting: Otolaryngology

## 2022-11-26 DIAGNOSIS — I10 Essential (primary) hypertension: Secondary | ICD-10-CM | POA: Diagnosis not present

## 2022-11-26 DIAGNOSIS — I5023 Acute on chronic systolic (congestive) heart failure: Secondary | ICD-10-CM | POA: Diagnosis not present

## 2022-11-26 DIAGNOSIS — R1310 Dysphagia, unspecified: Secondary | ICD-10-CM

## 2022-11-26 DIAGNOSIS — R0982 Postnasal drip: Secondary | ICD-10-CM

## 2022-11-26 DIAGNOSIS — R600 Localized edema: Secondary | ICD-10-CM | POA: Diagnosis not present

## 2022-11-26 DIAGNOSIS — M4807 Spinal stenosis, lumbosacral region: Secondary | ICD-10-CM | POA: Diagnosis not present

## 2022-11-26 DIAGNOSIS — K219 Gastro-esophageal reflux disease without esophagitis: Secondary | ICD-10-CM | POA: Diagnosis not present

## 2022-12-27 ENCOUNTER — Ambulatory Visit
Admission: RE | Admit: 2022-12-27 | Discharge: 2022-12-27 | Disposition: A | Discharge status: Home / Self Care | Payer: PPO | Source: Ambulatory Visit | Attending: Otolaryngology | Admitting: Otolaryngology

## 2022-12-27 DIAGNOSIS — R0982 Postnasal drip: Secondary | ICD-10-CM | POA: Insufficient documentation

## 2022-12-27 DIAGNOSIS — R1310 Dysphagia, unspecified: Secondary | ICD-10-CM | POA: Diagnosis not present

## 2023-01-06 ENCOUNTER — Other Ambulatory Visit: Payer: Self-pay

## 2023-01-06 ENCOUNTER — Emergency Department: Payer: PPO

## 2023-01-06 ENCOUNTER — Inpatient Hospital Stay
Admission: EM | Admit: 2023-01-06 | Discharge: 2023-01-10 | DRG: 563 | Disposition: A | Discharge status: Skilled Nursing Facility | Payer: PPO | Attending: Internal Medicine | Admitting: Internal Medicine

## 2023-01-06 DIAGNOSIS — Z7902 Long term (current) use of antithrombotics/antiplatelets: Secondary | ICD-10-CM

## 2023-01-06 DIAGNOSIS — S2241XA Multiple fractures of ribs, right side, initial encounter for closed fracture: Secondary | ICD-10-CM

## 2023-01-06 DIAGNOSIS — K5730 Diverticulosis of large intestine without perforation or abscess without bleeding: Secondary | ICD-10-CM | POA: Diagnosis not present

## 2023-01-06 DIAGNOSIS — W19XXXA Unspecified fall, initial encounter: Secondary | ICD-10-CM | POA: Diagnosis not present

## 2023-01-06 DIAGNOSIS — R531 Weakness: Secondary | ICD-10-CM | POA: Diagnosis not present

## 2023-01-06 DIAGNOSIS — G629 Polyneuropathy, unspecified: Secondary | ICD-10-CM | POA: Diagnosis present

## 2023-01-06 DIAGNOSIS — E041 Nontoxic single thyroid nodule: Secondary | ICD-10-CM | POA: Diagnosis not present

## 2023-01-06 DIAGNOSIS — Z9181 History of falling: Secondary | ICD-10-CM

## 2023-01-06 DIAGNOSIS — N1831 Chronic kidney disease, stage 3a: Secondary | ICD-10-CM | POA: Diagnosis not present

## 2023-01-06 DIAGNOSIS — R1312 Dysphagia, oropharyngeal phase: Secondary | ICD-10-CM | POA: Diagnosis not present

## 2023-01-06 DIAGNOSIS — H40053 Ocular hypertension, bilateral: Secondary | ICD-10-CM | POA: Diagnosis not present

## 2023-01-06 DIAGNOSIS — M4312 Spondylolisthesis, cervical region: Secondary | ICD-10-CM | POA: Diagnosis not present

## 2023-01-06 DIAGNOSIS — I959 Hypotension, unspecified: Secondary | ICD-10-CM | POA: Diagnosis not present

## 2023-01-06 DIAGNOSIS — S299XXA Unspecified injury of thorax, initial encounter: Secondary | ICD-10-CM | POA: Diagnosis not present

## 2023-01-06 DIAGNOSIS — Z955 Presence of coronary angioplasty implant and graft: Secondary | ICD-10-CM

## 2023-01-06 DIAGNOSIS — K219 Gastro-esophageal reflux disease without esophagitis: Secondary | ICD-10-CM | POA: Diagnosis not present

## 2023-01-06 DIAGNOSIS — S42034D Nondisplaced fracture of lateral end of right clavicle, subsequent encounter for fracture with routine healing: Secondary | ICD-10-CM | POA: Diagnosis not present

## 2023-01-06 DIAGNOSIS — J984 Other disorders of lung: Secondary | ICD-10-CM | POA: Diagnosis not present

## 2023-01-06 DIAGNOSIS — D860 Sarcoidosis of lung: Secondary | ICD-10-CM | POA: Diagnosis present

## 2023-01-06 DIAGNOSIS — M542 Cervicalgia: Secondary | ICD-10-CM | POA: Diagnosis not present

## 2023-01-06 DIAGNOSIS — M958 Other specified acquired deformities of musculoskeletal system: Secondary | ICD-10-CM | POA: Diagnosis not present

## 2023-01-06 DIAGNOSIS — I517 Cardiomegaly: Secondary | ICD-10-CM | POA: Diagnosis not present

## 2023-01-06 DIAGNOSIS — F419 Anxiety disorder, unspecified: Secondary | ICD-10-CM | POA: Diagnosis present

## 2023-01-06 DIAGNOSIS — I5032 Chronic diastolic (congestive) heart failure: Secondary | ICD-10-CM | POA: Diagnosis present

## 2023-01-06 DIAGNOSIS — I700 Atherosclerosis of aorta: Secondary | ICD-10-CM | POA: Diagnosis present

## 2023-01-06 DIAGNOSIS — R519 Headache, unspecified: Secondary | ICD-10-CM | POA: Diagnosis not present

## 2023-01-06 DIAGNOSIS — M47816 Spondylosis without myelopathy or radiculopathy, lumbar region: Secondary | ICD-10-CM | POA: Diagnosis present

## 2023-01-06 DIAGNOSIS — S4991XA Unspecified injury of right shoulder and upper arm, initial encounter: Secondary | ICD-10-CM | POA: Diagnosis not present

## 2023-01-06 DIAGNOSIS — Z882 Allergy status to sulfonamides status: Secondary | ICD-10-CM

## 2023-01-06 DIAGNOSIS — Z602 Problems related to living alone: Secondary | ICD-10-CM | POA: Diagnosis present

## 2023-01-06 DIAGNOSIS — D631 Anemia in chronic kidney disease: Secondary | ICD-10-CM | POA: Diagnosis present

## 2023-01-06 DIAGNOSIS — I130 Hypertensive heart and chronic kidney disease with heart failure and stage 1 through stage 4 chronic kidney disease, or unspecified chronic kidney disease: Secondary | ICD-10-CM | POA: Diagnosis present

## 2023-01-06 DIAGNOSIS — K449 Diaphragmatic hernia without obstruction or gangrene: Secondary | ICD-10-CM | POA: Diagnosis not present

## 2023-01-06 DIAGNOSIS — M1990 Unspecified osteoarthritis, unspecified site: Secondary | ICD-10-CM | POA: Diagnosis present

## 2023-01-06 DIAGNOSIS — I739 Peripheral vascular disease, unspecified: Secondary | ICD-10-CM | POA: Diagnosis not present

## 2023-01-06 DIAGNOSIS — I272 Pulmonary hypertension, unspecified: Secondary | ICD-10-CM | POA: Diagnosis not present

## 2023-01-06 DIAGNOSIS — M19011 Primary osteoarthritis, right shoulder: Secondary | ICD-10-CM | POA: Diagnosis not present

## 2023-01-06 DIAGNOSIS — D869 Sarcoidosis, unspecified: Secondary | ICD-10-CM | POA: Diagnosis present

## 2023-01-06 DIAGNOSIS — Z96651 Presence of right artificial knee joint: Secondary | ICD-10-CM | POA: Diagnosis not present

## 2023-01-06 DIAGNOSIS — Z888 Allergy status to other drugs, medicaments and biological substances status: Secondary | ICD-10-CM

## 2023-01-06 DIAGNOSIS — T07XXXA Unspecified multiple injuries, initial encounter: Secondary | ICD-10-CM | POA: Insufficient documentation

## 2023-01-06 DIAGNOSIS — G4733 Obstructive sleep apnea (adult) (pediatric): Secondary | ICD-10-CM | POA: Diagnosis not present

## 2023-01-06 DIAGNOSIS — S79911A Unspecified injury of right hip, initial encounter: Secondary | ICD-10-CM | POA: Diagnosis not present

## 2023-01-06 DIAGNOSIS — E538 Deficiency of other specified B group vitamins: Secondary | ICD-10-CM | POA: Diagnosis not present

## 2023-01-06 DIAGNOSIS — I2729 Other secondary pulmonary hypertension: Secondary | ICD-10-CM | POA: Diagnosis present

## 2023-01-06 DIAGNOSIS — S42034A Nondisplaced fracture of lateral end of right clavicle, initial encounter for closed fracture: Secondary | ICD-10-CM | POA: Diagnosis not present

## 2023-01-06 DIAGNOSIS — Z90710 Acquired absence of both cervix and uterus: Secondary | ICD-10-CM

## 2023-01-06 DIAGNOSIS — R296 Repeated falls: Secondary | ICD-10-CM | POA: Diagnosis present

## 2023-01-06 DIAGNOSIS — H538 Other visual disturbances: Secondary | ICD-10-CM | POA: Diagnosis present

## 2023-01-06 DIAGNOSIS — W19XXXD Unspecified fall, subsequent encounter: Secondary | ICD-10-CM | POA: Diagnosis not present

## 2023-01-06 DIAGNOSIS — E559 Vitamin D deficiency, unspecified: Secondary | ICD-10-CM | POA: Diagnosis not present

## 2023-01-06 DIAGNOSIS — M47812 Spondylosis without myelopathy or radiculopathy, cervical region: Secondary | ICD-10-CM | POA: Diagnosis not present

## 2023-01-06 DIAGNOSIS — Z7401 Bed confinement status: Secondary | ICD-10-CM | POA: Diagnosis not present

## 2023-01-06 DIAGNOSIS — H9190 Unspecified hearing loss, unspecified ear: Secondary | ICD-10-CM | POA: Diagnosis not present

## 2023-01-06 DIAGNOSIS — Z96643 Presence of artificial hip joint, bilateral: Secondary | ICD-10-CM | POA: Diagnosis not present

## 2023-01-06 DIAGNOSIS — I10 Essential (primary) hypertension: Secondary | ICD-10-CM | POA: Diagnosis not present

## 2023-01-06 DIAGNOSIS — Z79899 Other long term (current) drug therapy: Secondary | ICD-10-CM

## 2023-01-06 DIAGNOSIS — S3991XA Unspecified injury of abdomen, initial encounter: Secondary | ICD-10-CM | POA: Diagnosis not present

## 2023-01-06 DIAGNOSIS — I2510 Atherosclerotic heart disease of native coronary artery without angina pectoris: Secondary | ICD-10-CM | POA: Diagnosis present

## 2023-01-06 DIAGNOSIS — K5900 Constipation, unspecified: Secondary | ICD-10-CM | POA: Diagnosis not present

## 2023-01-06 DIAGNOSIS — N281 Cyst of kidney, acquired: Secondary | ICD-10-CM | POA: Diagnosis not present

## 2023-01-06 DIAGNOSIS — G20A1 Parkinson's disease without dyskinesia, without mention of fluctuations: Secondary | ICD-10-CM | POA: Diagnosis present

## 2023-01-06 DIAGNOSIS — S2241XD Multiple fractures of ribs, right side, subsequent encounter for fracture with routine healing: Secondary | ICD-10-CM | POA: Diagnosis not present

## 2023-01-06 DIAGNOSIS — J301 Allergic rhinitis due to pollen: Secondary | ICD-10-CM | POA: Diagnosis present

## 2023-01-06 DIAGNOSIS — Z96653 Presence of artificial knee joint, bilateral: Secondary | ICD-10-CM | POA: Diagnosis present

## 2023-01-06 DIAGNOSIS — Z8249 Family history of ischemic heart disease and other diseases of the circulatory system: Secondary | ICD-10-CM

## 2023-01-06 DIAGNOSIS — J9811 Atelectasis: Secondary | ICD-10-CM | POA: Diagnosis not present

## 2023-01-06 DIAGNOSIS — S42001A Fracture of unspecified part of right clavicle, initial encounter for closed fracture: Secondary | ICD-10-CM | POA: Diagnosis not present

## 2023-01-06 DIAGNOSIS — Z886 Allergy status to analgesic agent status: Secondary | ICD-10-CM

## 2023-01-06 LAB — CBC
HCT: 28 % — ABNORMAL LOW (ref 36.0–46.0)
Hemoglobin: 8.8 g/dL — ABNORMAL LOW (ref 12.0–15.0)
MCH: 29.8 pg (ref 26.0–34.0)
MCHC: 31.4 g/dL (ref 30.0–36.0)
MCV: 94.9 fL (ref 80.0–100.0)
Platelets: 174 10*3/uL (ref 150–400)
RBC: 2.95 MIL/uL — ABNORMAL LOW (ref 3.87–5.11)
RDW: 15.3 % (ref 11.5–15.5)
WBC: 6.6 10*3/uL (ref 4.0–10.5)
nRBC: 0 % (ref 0.0–0.2)

## 2023-01-06 LAB — CBC WITH DIFFERENTIAL/PLATELET
Abs Immature Granulocytes: 0.01 10*3/uL (ref 0.00–0.07)
Basophils Absolute: 0 10*3/uL (ref 0.0–0.1)
Basophils Relative: 0 %
Eosinophils Absolute: 0.2 10*3/uL (ref 0.0–0.5)
Eosinophils Relative: 2 %
HCT: 29.5 % — ABNORMAL LOW (ref 36.0–46.0)
Hemoglobin: 9.2 g/dL — ABNORMAL LOW (ref 12.0–15.0)
Immature Granulocytes: 0 %
Lymphocytes Relative: 30 %
Lymphs Abs: 2.4 10*3/uL (ref 0.7–4.0)
MCH: 30 pg (ref 26.0–34.0)
MCHC: 31.2 g/dL (ref 30.0–36.0)
MCV: 96.1 fL (ref 80.0–100.0)
Monocytes Absolute: 0.7 10*3/uL (ref 0.1–1.0)
Monocytes Relative: 9 %
Neutro Abs: 4.6 10*3/uL (ref 1.7–7.7)
Neutrophils Relative %: 59 %
Platelets: 184 10*3/uL (ref 150–400)
RBC: 3.07 MIL/uL — ABNORMAL LOW (ref 3.87–5.11)
RDW: 15.3 % (ref 11.5–15.5)
WBC: 7.9 10*3/uL (ref 4.0–10.5)
nRBC: 0 % (ref 0.0–0.2)

## 2023-01-06 LAB — BASIC METABOLIC PANEL
Anion gap: 7 (ref 5–15)
BUN: 24 mg/dL — ABNORMAL HIGH (ref 8–23)
CO2: 27 mmol/L (ref 22–32)
Calcium: 8.9 mg/dL (ref 8.9–10.3)
Chloride: 106 mmol/L (ref 98–111)
Creatinine, Ser: 1.08 mg/dL — ABNORMAL HIGH (ref 0.44–1.00)
GFR, Estimated: 47 mL/min — ABNORMAL LOW (ref >60)
Glucose, Bld: 120 mg/dL — ABNORMAL HIGH (ref 70–99)
Potassium: 3.8 mmol/L (ref 3.5–5.1)
Sodium: 140 mmol/L (ref 135–145)

## 2023-01-06 LAB — CREATININE, SERUM
Creatinine, Ser: 0.95 mg/dL (ref 0.44–1.00)
GFR, Estimated: 55 mL/min — ABNORMAL LOW (ref >60)

## 2023-01-06 LAB — CK: Total CK: 113 U/L (ref 38–234)

## 2023-01-06 MED ORDER — FUROSEMIDE 40 MG PO TABS: 20.0000 mg | ORAL_TABLET | Freq: Every day | ORAL | Status: DC

## 2023-01-06 MED ORDER — UMECLIDINIUM BROMIDE 62.5 MCG/ACT IN AEPB
1.0000 | INHALATION_SPRAY | Freq: Every day | RESPIRATORY_TRACT | Status: DC
  Administered 2023-01-07 – 2023-01-10 (×4): 1 via RESPIRATORY_TRACT
  Filled 2023-01-06: qty 7

## 2023-01-06 MED ORDER — CARBIDOPA-LEVODOPA 25-100 MG PO TABS
1.0000 | ORAL_TABLET | Freq: Every day | ORAL | Status: DC
  Administered 2023-01-07 – 2023-01-09 (×4): 1 via ORAL
  Filled 2023-01-06 (×5): qty 1

## 2023-01-06 MED ORDER — ACETAMINOPHEN 650 MG RE SUPP: 650.0000 mg | Freq: Four times a day (QID) | RECTAL | Status: DC | PRN

## 2023-01-06 MED ORDER — SODIUM CHLORIDE 0.9 % IV SOLN: INTRAVENOUS | Status: DC

## 2023-01-06 MED ORDER — ENOXAPARIN SODIUM 40 MG/0.4ML IJ SOSY: 40.0000 mg | PREFILLED_SYRINGE | Freq: Every day | INTRAMUSCULAR | Status: DC

## 2023-01-06 MED ORDER — IOHEXOL 300 MG/ML  SOLN
100.0000 mL | Freq: Once | INTRAMUSCULAR | Status: AC | PRN
  Administered 2023-01-06: 100 mL via INTRAVENOUS

## 2023-01-06 MED ORDER — FLUTICASONE FUROATE-VILANTEROL 100-25 MCG/ACT IN AEPB
1.0000 | INHALATION_SPRAY | Freq: Every day | RESPIRATORY_TRACT | Status: DC
  Administered 2023-01-07 – 2023-01-10 (×4): 1 via RESPIRATORY_TRACT
  Filled 2023-01-06: qty 28

## 2023-01-06 MED ORDER — ACETAMINOPHEN 325 MG PO TABS
650.0000 mg | ORAL_TABLET | Freq: Four times a day (QID) | ORAL | Status: DC | PRN
  Administered 2023-01-07: 650 mg via ORAL
  Filled 2023-01-06: qty 2

## 2023-01-06 MED ORDER — ONDANSETRON HCL 4 MG PO TABS: 4.0000 mg | ORAL_TABLET | Freq: Four times a day (QID) | ORAL | Status: DC | PRN

## 2023-01-06 MED ORDER — METOPROLOL SUCCINATE ER 50 MG PO TB24
100.0000 mg | ORAL_TABLET | Freq: Every day | ORAL | Status: DC
  Administered 2023-01-07 – 2023-01-10 (×4): 100 mg via ORAL
  Filled 2023-01-06 (×4): qty 2

## 2023-01-06 MED ORDER — CLOPIDOGREL BISULFATE 75 MG PO TABS
75.0000 mg | ORAL_TABLET | Freq: Every day | ORAL | Status: DC
  Administered 2023-01-07 – 2023-01-10 (×4): 75 mg via ORAL
  Filled 2023-01-06 (×4): qty 1

## 2023-01-06 MED ORDER — ONDANSETRON HCL 4 MG/2ML IJ SOLN: 4.0000 mg | Freq: Four times a day (QID) | INTRAMUSCULAR | Status: DC | PRN

## 2023-01-06 NOTE — Assessment & Plan Note (Signed)
Cr 1.08 on admission  Appears near baseline  Follow

## 2023-01-06 NOTE — Assessment & Plan Note (Addendum)
Multiple recurrent falls at home and noted secondary clavicle fracture, ? Scapula deformity as well as rib fractures Nonfocal neuro exam apart from general weakness  Noted baseline difficulty with ambulation seems to be progressively worsening Will plan for formal PT OT evaluation Fall precautions Anticipate placement given worsening functional status Follow

## 2023-01-06 NOTE — Assessment & Plan Note (Signed)
Appears stable from a resp standpoint

## 2023-01-06 NOTE — H&P (Signed)
History and Physical    [name] ZOX:[id number] DOB: [birth year] DOA: 01/06/2023 DOS: the patient was seen and examined on 01/06/2023 PCP: Enid Baas, MD  Patient coming from: Home  Chief Complaint:  Chief Complaint  Patient presents with   Fall   HPI: [name] is a 89 y.o. female with medical history significant of multiple medical issues including CHF, coronary artery disease, GERD, neuropathy, sarcoidosis, presenting with recurrent falls, scapular and rib fractures.  Patient noted to live at home at baseline.  Ambulates with a walker.  She had episodes of falling associated with going to the bathroom.  Patient denies any weakness or dizziness prior to fall.  Had left-sided trauma with concomitant head trauma as well.  No reported loss of consciousness.  No chest pain or shortness of breath.  No nausea or vomiting.  Was found by a neighbor after being down for multiple hours.  Does report generalized weakness.  No chest pain or shortness of breath.  No hemiparesis or confusion.  No reported medication changes recently.  No reported alcohol or tobacco use.  Positive mild vision changes status post fall. Presented to the ER afebrile, hemodynamically stable.  Labs grossly within normal limits.  Hemoglobin 9.2.  CK level within normal limits.  Imaging notable for displaced right distal clavicle fracture extending towards the [facility] joint with surrounding hematoma, nondisplaced fractures of the right second through sixth ribs right second nondisplaced wrist fracture being segmental.  Lumbar spondylosis with noted impingement at L2-L5.  Positive renal cyst.  Arterial atherosclerosis. Review of Systems: As mentioned in the history of present illness. All other systems reviewed and are negative. Past Medical History:  Diagnosis Date   Allergic rhinitis due to pollen    Anxiety    Aortic atherosclerosis (HCC)    CHF (congestive heart failure) (HCC)    Coronary artery  disease    Stent in 2000's (DC)   Dyspnea    GERD (gastroesophageal reflux disease)    Hard of hearing    Hypertension    Neuropathy    Obstructive sleep apnea    Osteoarthritis of both knees    Peripheral vascular disease (HCC)    Sarcoidosis    Thyroid nodule    Past Surgical History:  Procedure Laterality Date   ABDOMINAL HYSTERECTOMY     BIOPSY THYROID  2014   Dr Andee Poles   BREAST EXCISIONAL BIOPSY Left 1980s   surgical bx neg   BREAST EXCISIONAL BIOPSY Left 1980s   benign   CARDIOVASCULAR STRESS TEST  05/12/2012   with dobutamine---negative. Normal LV function   CORONARY ANGIOPLASTY  2006   ESOPHAGOGASTRODUODENOSCOPY (EGD) WITH PROPOFOL N/A 04/03/2020   Procedure: ESOPHAGOGASTRODUODENOSCOPY (EGD) WITH PROPOFOL;  Surgeon: [name], MD;  Location: [facility] END[facility]COPY;  Service: Gastroenterology;  Laterality: N/A;   JOINT REPLACEMENT Left 2000   hip   JOINT REPLACEMENT Right 2007   hip   KNEE ARTHROPLASTY Right 06/23/2017   Procedure: COMPUTER ASSISTED TOTAL KNEE ARTHROPLASTY;  Surgeon: Donato Heinz, MD;  Location: [facility] ORS;  Service: Orthopedics;  Laterality: Right;   Social History:  reports that she has never smoked. She has never used smokeless tobacco. She reports that she does not drink alcohol and does not use drugs.  Allergies  Allergen Reactions   Tramadol Other (See Comments)    CONFUSION   Sulfa Antibiotics     Patient does not recall   Zocor [Simvastatin] Other (See Comments)    INTOLERANCE-MYALGIAS     Family  History  Problem Relation Age of Onset   Cancer Sister 23       unknown - groin area   Heart attack Brother    Cancer Sister 68       pancreatic    Heart attack Sister    Heart attack Brother     Prior to Admission medications   Medication Sig Start Date End Date Taking? Authorizing Provider  acetaminophen-codeine (TYLENOL #3) 300-30 MG tablet Take 1 tablet by mouth every 6 (six) hours as needed. 11/26/22  Yes [provider]   potassium chloride SA (KLOR-CON M) 20 MEQ tablet Take 20 mEq by mouth daily. Take 1 tablet (20 mEq total) by mouth once daily With lasix 11/26/22 11/26/23 Yes [provider]  torsemide (DEMADEX) 100 MG tablet Take 50 mg by mouth daily. 10/23/22  Yes [provider]  Dwyane Luo 100-62.5-25 MCG/ACT AEPB Inhale 1 Inhalation into the lungs daily. 12/17/22  Yes [provider]  amLODipine (NORVASC) 5 MG tablet Take 1 tablet (5 mg total) by mouth daily. 09/10/22 10/10/22  Charise Killian, MD  carbidopa-levodopa (SINEMET IR) 25-100 MG tablet Take 1 tablet by mouth at bedtime. 07/19/22   [provider]  clopidogrel (PLAVIX) 75 MG tablet Take 75 mg by mouth daily. 07/19/22   [provider]  ferrous sulfate 325 (65 FE) MG EC tablet Take 1 tablet (325 mg total) by mouth 2 (two) times daily. 04/23/22 04/23/23  Arnetha Courser, MD  furosemide (LASIX) 20 MG tablet Take 1 tablet (20 mg total) by mouth every other day. 09/09/22   Charise Killian, MD  haloperidol (HALDOL) 5 MG tablet Take 1 tablet (5 mg total) by mouth every 4 (four) hours as needed for up to 3 days for agitation. May crush, mix with water and give sublingually if needed. 06/06/22 08/28/22  Delfino Lovett, MD  ipratropium-albuterol (DUONEB) 0.5-2.5 (3) MG/3ML SOLN Take 3 mLs by nebulization every 4 (four) hours as needed (shortness of breath.). 06/06/22   Delfino Lovett, MD  latanoprost (XALATAN) 0.005 % ophthalmic solution Place 1 drop into both eyes at bedtime. 05/30/17   [provider]  metoprolol succinate (TOPROL-XL) 100 MG 24 hr tablet Take 1 tablet (100 mg total) by mouth daily. Take with or immediately following a meal. 08/06/22   [name], MD  pantoprazole (PROTONIX) 20 MG tablet Take 1 tablet by mouth daily. 04/29/22   [provider]  senna (SENOKOT) 8.6 MG tablet Take 2 tablets (17.2 mg total) by mouth 2 (two) times daily. May crush, mix with water and give sublingually if needed.  06/06/22   Delfino Lovett, MD  vitamin B-12 1000 MCG tablet Take 1 tablet (1,000 mcg total) by mouth daily. 04/23/22   Arnetha Courser, MD    Physical Exam: Vitals:   01/06/23 0939 01/06/23 1603  BP: (!) 142/57 (!) 132/52  Pulse: 78 69  Resp: 18 18  Temp: 99.2 F (37.3 C) 99.8 F (37.7 C)  TempSrc: Oral Oral  SpO2: 98% 100%  Physical Exam Constitutional:      General: She is not in acute distress.    Appearance: She is normal weight.  HENT:     Head: Normocephalic and atraumatic.     Comments: Positive bruising across eyebrows.    Mouth/Throat:     Mouth: Mucous membranes are moist.  Eyes:     Pupils: Pupils are equal, round, and reactive to light.  Cardiovascular:     Rate and Rhythm: Normal rate and  regular rhythm.  Pulmonary:     Effort: Pulmonary effort is normal.  Abdominal:     General: Bowel sounds are normal.  Musculoskeletal:     Comments: Positive generalized weakness Positive tenderness palpation across right clavicles right sided chest wall   Neurological:     General: No focal deficit present.  Psychiatric:        Mood and Affect: Mood normal.      Data Reviewed:Results are pending, will review when available.  Assessment and Plan: * Fall Multiple recurrent falls at home and noted secondary clavicle fracture, ? Scapula deformity as well as rib fractures Nonfocal neuro exam apart from general weakness  Noted baseline difficulty with ambulation seems to be progressively worsening Will plan for formal PT OT evaluation Fall precautions Anticipate placement given worsening functional status Follow  Parkinson disease Appears near baseline  Cont sinemet  Reassess as clinically appropriate in setting of recurring falls.    Stage 3a chronic kidney disease (CKD) (HCC) Cr 1.08 on admission  Appears near baseline  Follow    Hypertension BP stable  Titrate home regimen    Coronary artery disease No active chest pain  Cont home regimen    Multiple  fractures Noted Displaced right distal clavicular fracture extending towards but not within the [facility] joint. Surrounding hematoma. Amd Relatively nondisplaced fractures of the right second through sixth ribs, with the right second nondisplaced rib fractures secondary to falls at home  Pain control  PT/OT evaluation  Ortho consult as clinically indicated  Anticipate placement vs. Inpt rehab    Generalized weakness Acutely decompensated generalized weakness w/ secondary multiple fractures involving clavicles and ribs Noted baseline cervical and lumbar disease also noted Relatively nonfocal neuroexam Will plan for formal PT OT evaluation Anticipate placement versus inpatient rehab  Pulmonary hypertension associated with sarcoidosis ([place]) Appears stable from a resp standpoint        Advance Care Planning:   Code Status: Full Code   Consults: None   Family Communication: No family at the bedside.   Severity of Illness: The appropriate patient status for this patient is INPATIENT. Inpatient status is judged to be reasonable and necessary in order to provide the required intensity of service to ensure the patient's safety. The patient's presenting symptoms, physical exam findings, and initial radiographic and laboratory data in the context of their chronic comorbidities is felt to place them at high risk for further clinical deterioration. Furthermore, it is not anticipated that the patient will be medically stable for discharge from the hospital within 2 midnights of admission.   * I certify that at the point of admission it is my clinical judgment that the patient will require inpatient hospital care spanning beyond 2 midnights from the point of admission due to high intensity of service, high risk for further deterioration and high frequency of surveillance required.*  Author: Deneise Lever, MD 01/06/2023 7:44 PM  For on call review [email or web address].

## 2023-01-06 NOTE — ED Provider Notes (Signed)
-----------------------------------------   6:46 PM on 01/06/2023 -----------------------------------------  I took over care of this patient from Dr. Jacelyn Grip.  CT does not show any additional traumatic findings but confirms multiple right-sided nondisplaced rib fractures and the clavicle fracture.  Basic labs are unremarkable with mild anemia, no leukocytosis, and normal electrolytes.  I consulted Dr. Ernestina Patches from the hospitalist service; based our discussion he agreed to admit the patient.   Arta Silence, MD 01/06/23 [phone]

## 2023-01-06 NOTE — Assessment & Plan Note (Signed)
Appears near baseline  Cont sinemet  Reassess as clinically appropriate in setting of recurring falls.

## 2023-01-06 NOTE — Assessment & Plan Note (Signed)
No active chest pain  Cont home regimen

## 2023-01-06 NOTE — ED Triage Notes (Signed)
Pt comes with c/o fall. Pt states she was walking with walker yesterday and fell. Pt states whole right side hurts. Pt states shoulder is worse. Pt did hit her head. No loc. Pt is on thinners.

## 2023-01-06 NOTE — Assessment & Plan Note (Signed)
Acutely decompensated generalized weakness w/ secondary multiple fractures involving clavicles and ribs Noted baseline cervical and lumbar disease also noted Relatively nonfocal neuroexam Will plan for formal PT OT evaluation Anticipate placement versus inpatient rehab

## 2023-01-06 NOTE — Assessment & Plan Note (Signed)
Noted Displaced right distal clavicular fracture extending towards but not within the [facility] joint. Surrounding hematoma. Amd Relatively nondisplaced fractures of the right second through sixth ribs, with the right second nondisplaced rib fractures secondary to falls at home  Pain control  PT/OT evaluation  Anticipate placement vs. Inpt rehab

## 2023-01-06 NOTE — ED Provider Notes (Signed)
[facility] Provider Note    Event Date/Time   First MD Initiated Contact with Patient 01/06/23 1231     (approximate)   History   Fall   HPI  Sheila Long is a 89 y.o. female   Past medical history of CHF, CAD, GERD hypertension osteoarthritis, PVD, sarcoidosis who presents to the emergency department with a fall.  She is ambulatory with a walker at baseline at her home.  Lives alone.  She was transitioning to the bathroom, left her walker to reach for something in the bathroom when she had a fall onto her right side.  This was last evening.  She was on the ground for approximately 3 hours before neighbor checked on her and helped her to her wheelchair and she slept in her wheelchair, had right shoulder and right rib pain so presented to the hospital today.  Has not been weightbearing since her injuries.  She did strike the right side of her head and complains of headache and blurry vision since then.  Is on Plavix.  She denies any other acute medical complaints, denies any other trauma or recent illnesses.  History was obtained via.  Her caretaker is at bedside as an independent historian who corroborates information given above.  I reviewed external medical notes including discharge summary dated 09/09/2022 when she was in the hospital for COVID and respiratory infectious symptoms      Physical Exam   Triage Vital Signs: ED Triage Vitals  Enc Vitals Group     BP 01/06/23 0939 (!) 142/57     Pulse Rate 01/06/23 0939 78     Resp 01/06/23 0939 18     Temp 01/06/23 0939 99.2 F (37.3 C)     Temp Source 01/06/23 0939 Oral     SpO2 01/06/23 0939 98 %     Weight --      Height --      Head Circumference --      Peak Flow --      Pain Score 01/06/23 0946 10     Pain Loc --      Pain Edu? --      Excl. in [place]? --     Most recent vital signs: Vitals:   01/06/23 0939  BP: (!) 142/57  Pulse: 78  Resp: 18  Temp: 99.2 F (37.3 C)  SpO2:  98%    General: Awake, no distress.  CV:  Good peripheral perfusion.  Resp:  Normal effort.  Abd:  No distention.  Other:  She has tenderness to palpation to the clavicle right side, range of motion of the right shoulder limited due to pain, neurovascular intact, no signs of head injury, no C-spine tenderness or deformities, she does have right chest wall tenderness.  No abdominal tenderness.   ED Results / Procedures / Treatments   Labs (all labs ordered are listed, but only abnormal results are displayed) Labs Reviewed  BASIC METABOLIC PANEL  CBC WITH DIFFERENTIAL/PLATELET  CK      RADIOLOGY I independently reviewed and interpreted CT of the head see no obvious bleeding or midline shift   PROCEDURES:  Critical Care performed: No  Procedures   MEDICATIONS ORDERED IN ED: Medications - No data to display  Consultants:  I spoke with hospitalist for admission & regarding care plan for this patient.   IMPRESSION / MDM / ASSESSMENT AND PLAN / ED COURSE  I reviewed the triage vital signs and the nursing notes.  Differential diagnosis includes, but is not limited to, traumatic injury including intracranial bleeding, fractures dislocation, rib fracture, pneumothorax or hemothorax, internal bleeding, rhabdomyolysis  MDM: This is a patient elderly normally ambulatory with walker at home with a fall and x-rays reveal distal clavicle fracture as well as multiple rib fractures no pneumothorax, will further characterize with CT scan, needs admission for PT/placement given no longer going to be able to walk with walker with clavicular fracture. Mechanical in nature no syncope.   Patient's presentation is most consistent with acute presentation with potential threat to life or bodily function.       FINAL CLINICAL IMPRESSION(S) / ED DIAGNOSES   Final diagnoses:  Fall, initial encounter  Closed fracture of multiple ribs of right side, initial  encounter  Closed nondisplaced fracture of acromial end of right clavicle, initial encounter     Rx / DC Orders   ED Discharge Orders     None        Note:  This document was prepared using Dragon voice recognition software and may include unintentional dictation errors.    Pilar Jarvis, MD 01/06/23 [id number]

## 2023-01-06 NOTE — Assessment & Plan Note (Signed)
BP stable Titrate home regimen 

## 2023-01-07 ENCOUNTER — Encounter: Payer: Self-pay | Admitting: Family Medicine

## 2023-01-07 DIAGNOSIS — W19XXXA Unspecified fall, initial encounter: Secondary | ICD-10-CM | POA: Diagnosis not present

## 2023-01-07 DIAGNOSIS — S42001A Fracture of unspecified part of right clavicle, initial encounter for closed fracture: Secondary | ICD-10-CM | POA: Diagnosis not present

## 2023-01-07 DIAGNOSIS — G20A1 Parkinson's disease without dyskinesia, without mention of fluctuations: Secondary | ICD-10-CM | POA: Diagnosis not present

## 2023-01-07 LAB — CBC WITH DIFFERENTIAL/PLATELET
Abs Immature Granulocytes: 0.03 10*3/uL (ref 0.00–0.07)
Basophils Absolute: 0 10*3/uL (ref 0.0–0.1)
Basophils Relative: 0 %
Eosinophils Absolute: 0.5 10*3/uL (ref 0.0–0.5)
Eosinophils Relative: 6 %
HCT: 27.2 % — ABNORMAL LOW (ref 36.0–46.0)
Hemoglobin: 8.5 g/dL — ABNORMAL LOW (ref 12.0–15.0)
Immature Granulocytes: 0 %
Lymphocytes Relative: 27 %
Lymphs Abs: 2 10*3/uL (ref 0.7–4.0)
MCH: 30.4 pg (ref 26.0–34.0)
MCHC: 31.3 g/dL (ref 30.0–36.0)
MCV: 97.1 fL (ref 80.0–100.0)
Monocytes Absolute: 0.7 10*3/uL (ref 0.1–1.0)
Monocytes Relative: 10 %
Neutro Abs: 4.1 10*3/uL (ref 1.7–7.7)
Neutrophils Relative %: 57 %
Platelets: 156 10*3/uL (ref 150–400)
RBC: 2.8 MIL/uL — ABNORMAL LOW (ref 3.87–5.11)
RDW: 15.3 % (ref 11.5–15.5)
WBC: 7.3 10*3/uL (ref 4.0–10.5)
nRBC: 0 % (ref 0.0–0.2)

## 2023-01-07 LAB — COMPREHENSIVE METABOLIC PANEL
ALT: <5 U/L (ref 0–44)
AST: 14 U/L — ABNORMAL LOW (ref 15–41)
Albumin: 3.2 g/dL — ABNORMAL LOW (ref 3.5–5.0)
Alkaline Phosphatase: 57 U/L (ref 38–126)
Anion gap: 3 — ABNORMAL LOW (ref 5–15)
BUN: 20 mg/dL (ref 8–23)
CO2: 27 mmol/L (ref 22–32)
Calcium: 8.6 mg/dL — ABNORMAL LOW (ref 8.9–10.3)
Chloride: 110 mmol/L (ref 98–111)
Creatinine, Ser: 0.96 mg/dL (ref 0.44–1.00)
GFR, Estimated: 54 mL/min — ABNORMAL LOW (ref >60)
Glucose, Bld: 132 mg/dL — ABNORMAL HIGH (ref 70–99)
Potassium: 3.7 mmol/L (ref 3.5–5.1)
Sodium: 140 mmol/L (ref 135–145)
Total Bilirubin: 1.2 mg/dL (ref 0.3–1.2)
Total Protein: 5.7 g/dL — ABNORMAL LOW (ref 6.5–8.1)

## 2023-01-07 LAB — IRON AND TIBC
Iron: 79 ug/dL (ref 28–170)
Saturation Ratios: 23 % (ref 10.4–31.8)
TIBC: 351 ug/dL (ref 250–450)
UIBC: 272 ug/dL

## 2023-01-07 LAB — VITAMIN D 25 HYDROXY (VIT D DEFICIENCY, FRACTURES): Vit D, 25-Hydroxy: 31.03 ng/mL (ref 30–100)

## 2023-01-07 LAB — FERRITIN: Ferritin: 20 ng/mL (ref 11–307)

## 2023-01-07 MED ORDER — IPRATROPIUM-ALBUTEROL 0.5-2.5 (3) MG/3ML IN SOLN: 3.0000 mL | RESPIRATORY_TRACT | Status: DC | PRN

## 2023-01-07 MED ORDER — METOPROLOL TARTRATE 5 MG/5ML IV SOLN: 5.0000 mg | INTRAVENOUS | Status: DC | PRN

## 2023-01-07 MED ORDER — TRAZODONE HCL 50 MG PO TABS: 50.0000 mg | ORAL_TABLET | Freq: Every evening | ORAL | Status: DC | PRN

## 2023-01-07 MED ORDER — ENOXAPARIN SODIUM 40 MG/0.4ML IJ SOSY
40.0000 mg | PREFILLED_SYRINGE | Freq: Every day | INTRAMUSCULAR | Status: DC
  Administered 2023-01-07 – 2023-01-10 (×4): 40 mg via SUBCUTANEOUS
  Filled 2023-01-07 (×4): qty 0.4

## 2023-01-07 MED ORDER — SENNOSIDES-DOCUSATE SODIUM 8.6-50 MG PO TABS: 1.0000 | ORAL_TABLET | Freq: Every evening | ORAL | Status: DC | PRN

## 2023-01-07 MED ORDER — GUAIFENESIN 100 MG/5ML PO LIQD: 5.0000 mL | ORAL | Status: DC | PRN

## 2023-01-07 MED ORDER — FERROUS SULFATE 325 (65 FE) MG PO TABS
325.0000 mg | ORAL_TABLET | Freq: Every day | ORAL | Status: DC
  Administered 2023-01-08 – 2023-01-10 (×3): 325 mg via ORAL
  Filled 2023-01-07 (×3): qty 1

## 2023-01-07 MED ORDER — SODIUM CHLORIDE 0.9 % IV SOLN: INTRAVENOUS | Status: DC

## 2023-01-07 MED ORDER — HYDRALAZINE HCL 20 MG/ML IJ SOLN: 10.0000 mg | INTRAMUSCULAR | Status: DC | PRN

## 2023-01-07 MED ORDER — OXYCODONE HCL 5 MG PO TABS: 5.0000 mg | ORAL_TABLET | ORAL | Status: DC | PRN

## 2023-01-07 NOTE — Consult Note (Signed)
ORTHOPAEDIC CONSULTATION  REQUESTING PHYSICIAN: Floydene FlockNewton, Steven J, MD  Delayed entry - patient evaluated ~2pm  Chief Complaint:   R clavicle fracture  History of Present Illness: Sheila Long is a RHD  89 y.o. female who sustained a fall earlier today. She does have a history of multiple prior falls. She uses a walker at baseline but was transferring to the bathroom and did not have her walker. She lives at her home, but does have full time assistance. Her neighbor found her yesterday after she had been down ~3 hours.   Imaging in ED showed R clavicle fracture and multiple R sided rib fractures.   PMHx significant for CHF, CAD s/p stent placement (on Plavix), GERD, hypertension, osteoarthritis, PVD, and sarcoidosis   Past Medical History:  Diagnosis Date   Allergic rhinitis due to pollen    Anxiety    Aortic atherosclerosis (HCC)    CHF (congestive heart failure) (HCC)    Coronary artery disease    Stent in 2000's (DC)   Dyspnea    GERD (gastroesophageal reflux disease)    Hard of hearing    Hypertension    Neuropathy    Obstructive sleep apnea    Osteoarthritis of both knees    Peripheral vascular disease (HCC)    Sarcoidosis    Thyroid nodule    Past Surgical History:  Procedure Laterality Date   ABDOMINAL HYSTERECTOMY     BIOPSY THYROID  2014   Dr Andee PolesVaught   BREAST EXCISIONAL BIOPSY Left 1980s   surgical bx neg   BREAST EXCISIONAL BIOPSY Left 1980s   benign   CARDIOVASCULAR STRESS TEST  05/12/2012   with dobutamine---negative. Normal LV function   CORONARY ANGIOPLASTY  2006   ESOPHAGOGASTRODUODENOSCOPY (EGD) WITH PROPOFOL N/A 04/03/2020   Procedure: ESOPHAGOGASTRODUODENOSCOPY (EGD) WITH PROPOFOL;  Surgeon: Wyline MoodAnna, Kiran, MD;  [facility];  Service: Gastroenterology;  Laterality: N/A;   JOINT REPLACEMENT Left 2000   hip   JOINT REPLACEMENT Right 2007   hip   KNEE ARTHROPLASTY Right  06/23/2017   Procedure: COMPUTER ASSISTED TOTAL KNEE ARTHROPLASTY;  Surgeon: Donato HeinzHooten, James P, MD;  Location: [facility] ORS;  Service: Orthopedics;  Laterality: Right;   Social History   Socioeconomic History   Marital status: Widowed    Spouse name: Not on file   Number of children: 0   Years of education: Not on file   Highest education level: Not on file  Occupational History   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  Tobacco Use   Smoking status: Never   Smokeless tobacco: Never  Vaping Use   Vaping Use: Never used  Substance and Sexual Activity   Alcohol use: No   Drug use: No   Sexual activity: Not Currently    Birth control/protection: Post-menopausal  Other Topics Concern   Not on file  Social History Narrative   Past living will   Needs to redo health care POA   Requests sister [employer] as decision makers   Would accept resuscitation   Not sure about tube feeds   Social Determinants of Health   Financial Resource Strain: Medium Risk (10/11/2021)   Overall Financial Resource Strain (CARDIA)    Difficulty of Paying Living Expenses: Somewhat hard  Food Insecurity: No Food Insecurity (01/07/2023)   Hunger Vital Sign    Worried About Running Out of Food in the Last Year: Never true    Ran Out of Food in the Last Year: Never true  Transportation  Needs: No Transportation Needs (01/07/2023)   PRAPARE - Administrator, Civil Service (Medical): No    Lack of Transportation (Non-Medical): No  Physical Activity: Inactive (10/11/2021)   Exercise Vital Sign    Days of Exercise per Week: 0 days    Minutes of Exercise per Session: 0 min  Stress: No Stress Concern Present (10/11/2021)   [employer] of Occupational Health - Occupational Stress Questionnaire    Feeling of Stress : Only a little  Social Connections: Moderately Integrated (10/11/2021)   Social Connection and Isolation Panel [NHANES]    Frequency of Communication with Friends  and Family: More than three times a week    Frequency of Social Gatherings with Friends and Family: More than three times a week    Attends Religious Services: More than 4 times per year    Active Member of [employer] or Organizations: Yes    Attends Banker Meetings: More than 4 times per year    Marital Status: Widowed   Family History  Problem Relation Age of Onset   Cancer Sister 4       unknown - groin area   Heart attack Brother    Cancer Sister 43       pancreatic    Heart attack Sister    Heart attack Brother    Allergies  Allergen Reactions   Tramadol Other (See Comments)    CONFUSION   Sulfa Antibiotics     Patient does not recall   Zocor [Simvastatin] Other (See Comments)    INTOLERANCE-MYALGIAS    Prior to Admission medications   Medication Sig Start Date End Date Taking? Authorizing Provider  clopidogrel (PLAVIX) 75 MG tablet Take 75 mg by mouth daily. 07/19/22  Yes [provider]  magnesium hydroxide (MILK OF MAGNESIA) 400 MG/5ML suspension Take 30 mLs by mouth daily as needed for mild constipation.   Yes [provider]  pantoprazole (PROTONIX) 40 MG tablet Take 40 mg by mouth daily. 04/29/22  Yes [provider]  potassium chloride SA (KLOR-CON M) 20 MEQ tablet Take 20 mEq by mouth daily. Take 1 tablet (20 mEq total) by mouth once daily With lasix 11/26/22 11/26/23 Yes [provider]  senna (SENOKOT) 8.6 MG tablet Take 2 tablets (17.2 mg total) by mouth 2 (two) times daily. May crush, mix with water and give sublingually if needed. 06/06/22  Yes Delfino Lovett, MD  torsemide (DEMADEX) 100 MG tablet Take 50 mg by mouth daily. 10/23/22  Yes [provider]  Dwyane Luo 100-62.5-25 MCG/ACT AEPB Inhale 1 Inhalation into the lungs daily. 12/17/22  Yes [provider]  acetaminophen-codeine (TYLENOL #3) 300-30 MG tablet Take 1 tablet by mouth every 6 (six) hours as needed. Patient not taking: Reported on  01/07/2023 11/26/22   [provider]  amLODipine (NORVASC) 5 MG tablet Take 1 tablet (5 mg total) by mouth daily. 09/10/22 10/10/22  Charise Killian, MD  carbidopa-levodopa (SINEMET IR) 25-100 MG tablet Take 1 tablet by mouth at bedtime. 07/19/22   [provider]  ferrous sulfate 325 (65 FE) MG EC tablet Take 1 tablet (325 mg total) by mouth 2 (two) times daily. 04/23/22 04/23/23  Arnetha Courser, MD  furosemide (LASIX) 20 MG tablet Take 1 tablet (20 mg total) by mouth every other day. 09/09/22   Charise Killian, MD  haloperidol (HALDOL) 5 MG tablet Take 1 tablet (5 mg total) by mouth every 4 (four) hours as needed for up to  3 days for agitation. May crush, mix with water and give sublingually if needed. 06/06/22 08/28/22  Delfino Lovett, MD  ipratropium-albuterol (DUONEB) 0.5-2.5 (3) MG/3ML SOLN Take 3 mLs by nebulization every 4 (four) hours as needed (shortness of breath.). Patient not taking: Reported on 01/07/2023 06/06/22   Delfino Lovett, MD  latanoprost (XALATAN) 0.005 % ophthalmic solution Place 1 drop into both eyes at bedtime. 05/30/17   [provider]  metoprolol succinate (TOPROL-XL) 100 MG 24 hr tablet Take 1 tablet (100 mg total) by mouth daily. Take with or immediately following a meal. 08/06/22   [name], MD  vitamin B-12 1000 MCG tablet Take 1 tablet (1,000 mcg total) by mouth daily. 04/23/22   Arnetha Courser, MD   Recent Labs    01/06/23 1549 01/06/23 2046 01/07/23 0438  WBC 7.9 6.6 7.3  HGB 9.2* 8.8* 8.5*  HCT 29.5* 28.0* 27.2*  PLT 184 174 156  K 3.8  --  3.7  CL 106  --  110  CO2 27  --  27  BUN 24*  --  20  CREATININE 1.08* 0.95 0.96  GLUCOSE 120*  --  132*  CALCIUM 8.9  --  8.6*   CT CHEST ABDOMEN PELVIS W CONTRAST  Result Date: 01/06/2023 CLINICAL DATA:  Fall while using rocker.  Right-sided pain. EXAM: CT CHEST, ABDOMEN, AND PELVIS WITH CONTRAST TECHNIQUE: Multidetector CT imaging of the chest, abdomen and pelvis was performed following the  standard protocol during bolus administration of intravenous contrast. RADIATION DOSE REDUCTION: This exam was performed according to the departmental dose-optimization program which includes automated exposure control, adjustment of the mA and/or kV according to patient size and/or use of iterative reconstruction technique. CONTRAST:  OMNIPAQUE IOHEXOL 300 MG/ML  SOLN COMPARISON:  Chest CT 06/05/2022. FINDINGS: CT CHEST FINDINGS Cardiovascular: Coronary, aortic arch, and branch vessel atherosclerotic vascular disease. Mild cardiomegaly. Mediastinum/Nodes: Small to moderate-sized hiatal hernia. Lungs/Pleura: Mild biapical pleuroparenchymal scarring. Mild dependent atelectasis in both lungs with some airway plugging in both lower lobes and bilateral airway thickening. Stable mild nodularity in the right middle lobe likely from old granulomatous disease, no change from 2018, benign. Musculoskeletal: Displaced right distal clavicular fracture extending towards but not within the [facility] joint. Mild comminution and surrounding hematoma. Substantial degenerative glenohumeral arthropathy bilaterally. Relatively nondisplaced fractures of the right second through sixth ribs, with the right second nondisplaced rib fractures being segmental. CT ABDOMEN PELVIS FINDINGS Hepatobiliary: Unremarkable Pancreas: Unremarkable Spleen: Unremarkable Adrenals/Urinary Tract: Distal ureters in urinary bladder obscured by streak artifact from the hip implants and barium in the rectum. Bosniak category 2 cyst of the left kidney measures 2.7 cm in diameter on image 63 of series 2 and appears to have a thin faint inferior septation. No further imaging workup of this lesion is indicated. Adrenal glands unremarkable. Stomach/Bowel: Sigmoid colon diverticulosis with scattered barium along sigmoid colon diverticula. There is also some dense barium in the rectum. Vascular/Lymphatic: Atherosclerosis is present, including aortoiliac atherosclerotic  disease. Reproductive: Uterus is thought to be absent. Region obscured by streak artifact. Other: No supplemental non-categorized findings. Musculoskeletal: Bilateral total hip prostheses. Bony demineralization. Lumbar spondylosis and degenerative disc disease with suspected mild degrees of impingement at L2-3, L3-4, and L4-5. IMPRESSION: 1. Displaced right distal clavicular fracture extending towards but not within the [facility] joint. Surrounding hematoma. 2. Relatively nondisplaced fractures of the right second through sixth ribs, with the right second nondisplaced rib fractures being segmental. 3. No acute findings in the abdomen/pelvis. 4. Airway thickening  with some airway plugging in both lower lobes, query bronchitis or reactive airways disease. 5. Small to moderate-sized hiatal hernia. 6. Bony demineralization. 7. Lumbar spondylosis and degenerative disc disease with suspected mild degrees of impingement at L2-3, L3-4, and L4-5. 8. Bosniak category 2 cyst of the left kidney measures 2.7 cm in diameter and appears to have a thin faint inferior septation. No further imaging workup of this lesion is indicated. 9. Aortic atherosclerosis. Aortic Atherosclerosis (ICD10-I70.0). Electronically Signed   By: Van Clines M.D.   On: 01/06/2023 18:18   DG Hip Unilat W or Wo Pelvis 2-3 Views Right  Result Date: 01/06/2023 CLINICAL DATA:  Trauma, fall EXAM: DG HIP (WITH OR WITHOUT PELVIS) 2-3V RIGHT COMPARISON:  None Available. FINDINGS: There is previous arthroplasty in both hips. No recent fracture is seen. There is barium in the rectum which is partly obscuring the pubic bones. There are smooth marginated calcifications medial to the lesser trochanter, possibly calcifications from previous injury or previous surgery. Scattered arterial calcifications are seen. There is high density, possibly barium in rectum and numerous diverticula in left colon. IMPRESSION: Previous arthroplasty in both hips. No recent fracture or  dislocation is seen. Pubic bones are partly obscured by dense contrast in the rectum limiting evaluation. Electronically Signed   By: Elmer Picker M.D.   On: 01/06/2023 11:04   DG Ribs Unilateral W/Chest Right  Result Date: 01/06/2023 CLINICAL DATA:  Trauma, fall EXAM: RIGHT RIBS AND CHEST - 3+ VIEW COMPARISON:  None Available. FINDINGS: Transverse diameter of heart is increased. Calcifications are seen in thoracic aorta. There are no signs of pulmonary edema or focal pulmonary consolidation. There is no pleural effusion or pneumothorax. Minimal cortical irregularity is seen in the anterior ends of right ninth and tenth ribs. Osteopenia is seen in bony structures. IMPRESSION: There is minimal cortical irregularity in the anterior ends of right ninth and tenth ribs suggesting recent or old undisplaced fractures. There is no evidence of focal pulmonary contusion. There is no pleural effusion or pneumothorax. Cardiomegaly.  Aortic atherosclerosis. Electronically Signed   By: Elmer Picker M.D.   On: 01/06/2023 11:00   DG Shoulder Right  Result Date: 01/06/2023 CLINICAL DATA:  Trauma, fall EXAM: RIGHT SHOULDER - 2+ VIEW COMPARISON:  None Available. FINDINGS: There is deformity in the lateral end of right clavicle suggesting possible recent fracture. In 1 of the images, there is cortical irregularity in the upper lateral aspect of right scapula which could not be seen in the rest of the images. There is subcortical cyst in the upper aspect of the glenoid. No fracture line is seen in right humerus. Degenerative changes are noted in glenohumeral joint with bony spurs and joint space narrowing. IMPRESSION: There is deformity in the lateral end of right clavicle suggesting recent fracture. There is deformity in the upper lateral aspect of scapula seen in a single view. Possibility of fracture in scapula is not excluded. If clinically warranted, follow-up CT may be considered. Degenerative changes are noted  in right shoulder. Electronically Signed   By: Elmer Picker M.D.   On: 01/06/2023 10:58   CT HEAD WO CONTRAST (5MM)  Result Date: 01/06/2023 CLINICAL DATA:  Provided history: Fall.  Pain in right side. EXAM: CT HEAD WITHOUT CONTRAST CT CERVICAL SPINE WITHOUT CONTRAST TECHNIQUE: Multidetector CT imaging of the head and cervical spine was performed following the standard protocol without intravenous contrast. Multiplanar CT image reconstructions of the cervical spine were also generated. RADIATION DOSE REDUCTION: This exam  was performed according to the departmental dose-optimization program which includes automated exposure control, adjustment of the mA and/or kV according to patient size and/or use of iterative reconstruction technique. COMPARISON:  Prior head CT examinations 08/31/2022 and earlier. FINDINGS: CT HEAD FINDINGS Brain: Generalized cerebral atrophy. Mild patchy and ill-defined hypoattenuation within the cerebral white matter, nonspecific but compatible with chronic small vessel disease. There is no acute intracranial hemorrhage. No demarcated cortical infarct. No extra-axial fluid collection. No evidence of an intracranial mass. No midline shift. Vascular: No hyperdense vessel.  Atherosclerotic calcifications. Skull: No fracture or aggressive osseous lesion. Sinuses/Orbits: No mass or acute finding within the imaged orbits. Large fluid level, and background mucosal thickening, within the partially imaged left maxillary sinus. CT CERVICAL SPINE FINDINGS Alignment: Nonspecific reversal of the expected cervical lordosis. Slight grade 1 anterolisthesis at C3-C4. 2 mm grade 1 anterolisthesis at C4-C5. Slight grade 1 anterolisthesis at C7-T1. Skull base and vertebrae: The basion-dental and atlanto-dental intervals are maintained.No evidence of acute fracture to the cervical spine. Vertebral ankylosis at C5-C6 and C6-C7. Soft tissues and spinal canal: No prevertebral fluid or swelling. No visible  canal hematoma. Disc levels: Cervical spondylosis with multilevel disc bulges/central disc protrusions, uncovertebral hypertrophy and facet arthrosis. Disc space narrowing is greatest at the non fused levels at C7-T1 (advanced at this level). No appreciable high-grade spinal canal stenosis. Multilevel bony neural foraminal narrowing. Upper chest: No consolidation within the imaged lung apices. No visible pneumothorax. Other: Partially imaged prominent soft tissue stranding within the right lower neck, right chest and right upper back. IMPRESSION: CT head: 1. No evidence of acute intracranial abnormality. 2. Mild chronic small vessel ischemic changes within the cerebral white matter. 3. Cerebral atrophy. 4. Left maxillary sinusitis. CT cervical spine: 1. No evidence of acute fracture to the cervical spine. 2. Partially imaged prominent soft tissue stranding within the right lower neck, right chest and right upper back. This likely reflects multiple hematomas. 3. Mild grade 1 anterolisthesis at C3-C4, C4-C5 and C7-T1. 4. Vertebral ankylosis at C5-C6 and C6-C7. 5. Cervical spondylosis. Electronically Signed   By: Jackey Loge D.O.   On: 01/06/2023 10:16   CT Cervical Spine Wo Contrast  Result Date: 01/06/2023 CLINICAL DATA:  Provided history: Fall.  Pain in right side. EXAM: CT HEAD WITHOUT CONTRAST CT CERVICAL SPINE WITHOUT CONTRAST TECHNIQUE: Multidetector CT imaging of the head and cervical spine was performed following the standard protocol without intravenous contrast. Multiplanar CT image reconstructions of the cervical spine were also generated. RADIATION DOSE REDUCTION: This exam was performed according to the departmental dose-optimization program which includes automated exposure control, adjustment of the mA and/or kV according to patient size and/or use of iterative reconstruction technique. COMPARISON:  Prior head CT examinations 08/31/2022 and earlier. FINDINGS: CT HEAD FINDINGS Brain: Generalized  cerebral atrophy. Mild patchy and ill-defined hypoattenuation within the cerebral white matter, nonspecific but compatible with chronic small vessel disease. There is no acute intracranial hemorrhage. No demarcated cortical infarct. No extra-axial fluid collection. No evidence of an intracranial mass. No midline shift. Vascular: No hyperdense vessel.  Atherosclerotic calcifications. Skull: No fracture or aggressive osseous lesion. Sinuses/Orbits: No mass or acute finding within the imaged orbits. Large fluid level, and background mucosal thickening, within the partially imaged left maxillary sinus. CT CERVICAL SPINE FINDINGS Alignment: Nonspecific reversal of the expected cervical lordosis. Slight grade 1 anterolisthesis at C3-C4. 2 mm grade 1 anterolisthesis at C4-C5. Slight grade 1 anterolisthesis at C7-T1. Skull base and vertebrae: The basion-dental and atlanto-dental intervals  are maintained.No evidence of acute fracture to the cervical spine. Vertebral ankylosis at C5-C6 and C6-C7. Soft tissues and spinal canal: No prevertebral fluid or swelling. No visible canal hematoma. Disc levels: Cervical spondylosis with multilevel disc bulges/central disc protrusions, uncovertebral hypertrophy and facet arthrosis. Disc space narrowing is greatest at the non fused levels at C7-T1 (advanced at this level). No appreciable high-grade spinal canal stenosis. Multilevel bony neural foraminal narrowing. Upper chest: No consolidation within the imaged lung apices. No visible pneumothorax. Other: Partially imaged prominent soft tissue stranding within the right lower neck, right chest and right upper back. IMPRESSION: CT head: 1. No evidence of acute intracranial abnormality. 2. Mild chronic small vessel ischemic changes within the cerebral white matter. 3. Cerebral atrophy. 4. Left maxillary sinusitis. CT cervical spine: 1. No evidence of acute fracture to the cervical spine. 2. Partially imaged prominent soft tissue stranding  within the right lower neck, right chest and right upper back. This likely reflects multiple hematomas. 3. Mild grade 1 anterolisthesis at C3-C4, C4-C5 and C7-T1. 4. Vertebral ankylosis at C5-C6 and C6-C7. 5. Cervical spondylosis. Electronically Signed   By: Jackey Loge D.O.   On: 01/06/2023 10:16     Positive ROS: All other systems have been reviewed and were otherwise negative with the exception of those mentioned in the HPI and as above.  Physical Exam: BP (!) 147/59 (BP Location: Left Arm)   Pulse 71   Temp 97.7 F (36.5 C)   Resp 17   SpO2 98%  General:  Alert, no acute distress Psychiatric:  Patient is competent for consent with normal mood and affect   Cardiovascular:  No pedal edema, regular rate and rhythm  Orthopedic Exam:  RUE: +ain/pin/u motor SILT r/u/m/ax +rad pulse RoM passive: 70FF, 20ER Significant bruising and TTP about distal clavicle   Imaging:  As above: type 2A distal clavicle fracture  Assessment/Plan: 89 yo F with R distal clavicle fracture and multiple R sided rib fractures. Discussed both surgical and nonsurgical management. Nonsurgical management would like to risk of nonunion, but this can be only mildly symptomatic. Patient wished to proceed with nonsurgical management.  Plan for RUE in sling. May have, finger, wrist, elbow RoM to perform basic self care activities if needed. Otherwise, recommend remaining NWB in sling. PT/OT Will follow peripherally. Please page with any questions. She may follow up at [facility] in ~2 weeks.    Signa Kell   01/07/2023 9:59 PM

## 2023-01-07 NOTE — Assessment & Plan Note (Signed)
Noted Displaced right distal clavicular fracture extending towards but not within the [facility] joint. Surrounding hematoma. Amd Relatively nondisplaced fractures of the right second through sixth ribs, with the right second nondisplaced rib fractures secondary to falls at home  Pain control  PT/OT evaluation  Ortho consult as clinically indicated  Anticipate placement vs. Inpt rehab

## 2023-01-07 NOTE — Progress Notes (Signed)
OT Cancellation Note  Patient Details Name: Sheila Long MRN: [id number] DOB: [birth year]   Cancelled Treatment:    Reason Eval/Treat Not Completed: Patient not medically ready. OT orders received, chart reviewed. Unable to obtain information from RN re: if pt has a sling in place. No weightbearing orders in yet despite imaging evidence of clavical fracture. Workup still in process and pt being admitted to floor. Will defer evaluation to next date once all appropriate orders are finalized.   Lanelle Bal  [facility] 01/07/2023, 12:39 PM

## 2023-01-07 NOTE — Progress Notes (Signed)
PT Cancellation Note  Patient Details Name: Sheila Long MRN: [id number] DOB: February 18, 1924   Cancelled Treatment:    Reason Eval/Treat Not Completed: Patient not medically ready (Chart reviewed, RN consulted. Pt still in ED. Author unable to get answer from RNx2 about sling in place. No weightbearing orders in yet despite imaging evidence of clavical fracture. Incongruity between imaging study and documentation of wrist fracture.) Workup still in process, pt in process of being admitted to floor. Will defer evaluation to next date once all appropriate orders are finalized.   12:29 PM, 01/07/23 Etta Grandchild, PT, DPT Physical Therapist - [facility]  [phone] ([place])    [place] [facility] 01/07/2023, 12:28 PM

## 2023-01-07 NOTE — Progress Notes (Addendum)
PROGRESS NOTE    Sheila Long  [id number] DOB: 06-05-1924 DOA: 01/06/2023 PCP: Gladstone Lighter, MD   Brief Narrative:  89 year old with history of multiple falls, CHF, CAD, GERD, neuropathy, sarcoidosis comes to the hospital with scapular and rib fractures.  At baseline she ambulates with a walker.  In the ED imaging was notable for right distal clavicular fracture with surrounding hematoma, none displaced multiple rib fractures, spondylosis with noted impingement at L2-L5.  Orthopedic, Dr. Posey Pronto consulted, awaiting input.   Assessment & Plan:  Principal Problem:   Fall Active Problems:   Parkinson disease   Hypertension   Stage 3a chronic kidney disease (CKD) ([place])   Coronary artery disease   Pulmonary hypertension associated with sarcoidosis (HCC)   Generalized weakness   Multiple fractures     Assessment and Plan: * Fall Multiple fractures-clavicular and multiple ribs. Lumbar spondylosis CK levels are normal.  PT/OT.  Check vitamin D.  Supportive care, pain control.  CT shows multiple areas of lumbar spondylosis with possible nerve impingement.  Once you are able to control her pain better, she would benefit from MRI.  CK levels are normal, check TSH. Ortho, Dr Serita Grit, consulted. Awaiting input in terms of non surgical  management.    Addendum 2pm: Ortho recommending standard sling. Otherwise conservative management.   Parkinson disease Resume her home medications.   Stage 3a chronic kidney disease (CKD) (HCC) Creatinine remained stable around 1.0  Hypertension Stable, continue to monitor.  IV as needed   Coronary artery disease No active chest pain  Cont home regimen   Generalized weakness Weakness workup as mentioned above.  Pulmonary hypertension associated with sarcoidosis ([place]) Appears stable from a resp standpoint.  As needed bronchodilators  PT/OT  DVT prophylaxis: Lovenox Code Status: Full code Family Communication:    Status is:  Inpatient  Doing ok, very weak and in pain. Awaiting ortho to give ofurther guidance.   Subjective: Feels ok, having trouble moving around due to multiple fractures    Examination:  General exam: Appears calm and comfortable . Elderly frail Respiratory system: Clear to auscultation. Respiratory effort normal. Cardiovascular system: S1 & S2 heard, RRR. No JVD, murmurs, rubs, gallops or clicks. No pedal edema. Gastrointestinal system: Abdomen is nondistended, soft and nontender. No organomegaly or masses felt. Normal bowel sounds heard. Central nervous system: Alert and oriented. No focal neurological deficits. Extremities: Limited ROS due to multiple fx.  Skin: No rashes, lesions or ulcers Psychiatry: Judgement and insight appear normal. Mood & affect appropriate.     Objective: Vitals:   01/07/23 0600 01/07/23 0630 01/07/23 0700 01/07/23 0833  BP: 132/66 (!) 141/63 135/64   Pulse: 73 72 73   Resp: (!) 26 (!) 25 (!) 29   Temp:    97.8 F (36.6 C)  TempSrc:    Oral  SpO2: 100% 97% 100%    No intake or output data in the 24 hours ending 01/07/23 0913 There were no vitals filed for this visit.   Data Reviewed:   CBC: Recent Labs  Lab 01/06/23 1549 01/06/23 2046 01/07/23 0438  WBC 7.9 6.6 7.3  NEUTROABS 4.6  --  4.1  HGB 9.2* 8.8* 8.5*  HCT 29.5* 28.0* 27.2*  MCV 96.1 94.9 97.1  PLT 184 174 [id number]   Basic Metabolic Panel: Recent Labs  Lab 01/06/23 1549 01/06/23 2046 01/07/23 0438  NA 140  --  140  K 3.8  --  3.7  CL 106  --  110  CO2 27  --  27  GLUCOSE 120*  --  132*  BUN 24*  --  20  CREATININE 1.08* 0.95 0.96  CALCIUM 8.9  --  8.6*   GFR: CrCl cannot be calculated (Unknown ideal weight.). Liver Function Tests: Recent Labs  Lab 01/07/23 0438  AST 14*  ALT <5  ALKPHOS 57  BILITOT 1.2  PROT 5.7*  ALBUMIN 3.2*   No results for input(s): "LIPASE", "AMYLASE" in the last 168 hours. No results for input(s): "AMMONIA" in the last 168  hours. Coagulation Profile: No results for input(s): "INR", "PROTIME" in the last 168 hours. Cardiac Enzymes: Recent Labs  Lab 01/06/23 1549  CKTOTAL 113   BNP (last 3 results) No results for input(s): "PROBNP" in the last 8760 hours. HbA1C: No results for input(s): "HGBA1C" in the last 72 hours. CBG: No results for input(s): "GLUCAP" in the last 168 hours. Lipid Profile: No results for input(s): "CHOL", "HDL", "LDLCALC", "TRIG", "CHOLHDL", "LDLDIRECT" in the last 72 hours. Thyroid Function Tests: No results for input(s): "TSH", "T4TOTAL", "FREET4", "T3FREE", "THYROIDAB" in the last 72 hours. Anemia Panel: No results for input(s): "VITAMINB12", "FOLATE", "FERRITIN", "TIBC", "IRON", "RETICCTPCT" in the last 72 hours. Sepsis Labs: No results for input(s): "PROCALCITON", "LATICACIDVEN" in the last 168 hours.  No results found for this or any previous visit (from the past 240 hour(s)).       Radiology Studies: CT CHEST ABDOMEN PELVIS W CONTRAST  Result Date: 01/06/2023 CLINICAL DATA:  Fall while using rocker.  Right-sided pain. EXAM: CT CHEST, ABDOMEN, AND PELVIS WITH CONTRAST TECHNIQUE: Multidetector CT imaging of the chest, abdomen and pelvis was performed following the standard protocol during bolus administration of intravenous contrast. RADIATION DOSE REDUCTION: This exam was performed according to the departmental dose-optimization program which includes automated exposure control, adjustment of the mA and/or kV according to patient size and/or use of iterative reconstruction technique. CONTRAST:  171mL OMNIPAQUE IOHEXOL 300 MG/ML  SOLN COMPARISON:  Chest CT 06/05/2022. FINDINGS: CT CHEST FINDINGS Cardiovascular: Coronary, aortic arch, and branch vessel atherosclerotic vascular disease. Mild cardiomegaly. Mediastinum/Nodes: Small to moderate-sized hiatal hernia. Lungs/Pleura: Mild biapical pleuroparenchymal scarring. Mild dependent atelectasis in both lungs with some airway plugging  in both lower lobes and bilateral airway thickening. Stable mild nodularity in the right middle lobe likely from old granulomatous disease, no change from 2018, benign. Musculoskeletal: Displaced right distal clavicular fracture extending towards but not within the [facility] joint. Mild comminution and surrounding hematoma. Substantial degenerative glenohumeral arthropathy bilaterally. Relatively nondisplaced fractures of the right second through sixth ribs, with the right second nondisplaced rib fractures being segmental. CT ABDOMEN PELVIS FINDINGS Hepatobiliary: Unremarkable Pancreas: Unremarkable Spleen: Unremarkable Adrenals/Urinary Tract: Distal ureters in urinary bladder obscured by streak artifact from the hip implants and barium in the rectum. Bosniak category 2 cyst of the left kidney measures 2.7 cm in diameter on image 63 of series 2 and appears to have a thin faint inferior septation. No further imaging workup of this lesion is indicated. Adrenal glands unremarkable. Stomach/Bowel: Sigmoid colon diverticulosis with scattered barium along sigmoid colon diverticula. There is also some dense barium in the rectum. Vascular/Lymphatic: Atherosclerosis is present, including aortoiliac atherosclerotic disease. Reproductive: Uterus is thought to be absent. Region obscured by streak artifact. Other: No supplemental non-categorized findings. Musculoskeletal: Bilateral total hip prostheses. Bony demineralization. Lumbar spondylosis and degenerative disc disease with suspected mild degrees of impingement at L2-3, L3-4, and L4-5. IMPRESSION: 1. Displaced right distal clavicular fracture extending towards but not within the [facility] joint. Surrounding hematoma. 2. Relatively  nondisplaced fractures of the right second through sixth ribs, with the right second nondisplaced rib fractures being segmental. 3. No acute findings in the abdomen/pelvis. 4. Airway thickening with some airway plugging in both lower lobes, query bronchitis or  reactive airways disease. 5. Small to moderate-sized hiatal hernia. 6. Bony demineralization. 7. Lumbar spondylosis and degenerative disc disease with suspected mild degrees of impingement at L2-3, L3-4, and L4-5. 8. Bosniak category 2 cyst of the left kidney measures 2.7 cm in diameter and appears to have a thin faint inferior septation. No further imaging workup of this lesion is indicated. 9. Aortic atherosclerosis. Aortic Atherosclerosis (ICD10-I70.0). Electronically Signed   By: Van Clines M.D.   On: 01/06/2023 18:18   DG Hip Unilat W or Wo Pelvis 2-3 Views Right  Result Date: 01/06/2023 CLINICAL DATA:  Trauma, fall EXAM: DG HIP (WITH OR WITHOUT PELVIS) 2-3V RIGHT COMPARISON:  None Available. FINDINGS: There is previous arthroplasty in both hips. No recent fracture is seen. There is barium in the rectum which is partly obscuring the pubic bones. There are smooth marginated calcifications medial to the lesser trochanter, possibly calcifications from previous injury or previous surgery. Scattered arterial calcifications are seen. There is high density, possibly barium in rectum and numerous diverticula in left colon. IMPRESSION: Previous arthroplasty in both hips. No recent fracture or dislocation is seen. Pubic bones are partly obscured by dense contrast in the rectum limiting evaluation. Electronically Signed   By: Elmer Picker M.D.   On: 01/06/2023 11:04   DG Ribs Unilateral W/Chest Right  Result Date: 01/06/2023 CLINICAL DATA:  Trauma, fall EXAM: RIGHT RIBS AND CHEST - 3+ VIEW COMPARISON:  None Available. FINDINGS: Transverse diameter of heart is increased. Calcifications are seen in thoracic aorta. There are no signs of pulmonary edema or focal pulmonary consolidation. There is no pleural effusion or pneumothorax. Minimal cortical irregularity is seen in the anterior ends of right ninth and tenth ribs. Osteopenia is seen in bony structures. IMPRESSION: There is minimal cortical  irregularity in the anterior ends of right ninth and tenth ribs suggesting recent or old undisplaced fractures. There is no evidence of focal pulmonary contusion. There is no pleural effusion or pneumothorax. Cardiomegaly.  Aortic atherosclerosis. Electronically Signed   By: Elmer Picker M.D.   On: 01/06/2023 11:00   DG Shoulder Right  Result Date: 01/06/2023 CLINICAL DATA:  Trauma, fall EXAM: RIGHT SHOULDER - 2+ VIEW COMPARISON:  None Available. FINDINGS: There is deformity in the lateral end of right clavicle suggesting possible recent fracture. In 1 of the images, there is cortical irregularity in the upper lateral aspect of right scapula which could not be seen in the rest of the images. There is subcortical cyst in the upper aspect of the glenoid. No fracture line is seen in right humerus. Degenerative changes are noted in glenohumeral joint with bony spurs and joint space narrowing. IMPRESSION: There is deformity in the lateral end of right clavicle suggesting recent fracture. There is deformity in the upper lateral aspect of scapula seen in a single view. Possibility of fracture in scapula is not excluded. If clinically warranted, follow-up CT may be considered. Degenerative changes are noted in right shoulder. Electronically Signed   By: Elmer Picker M.D.   On: 01/06/2023 10:58   CT HEAD WO CONTRAST (5MM)  Result Date: 01/06/2023 CLINICAL DATA:  Provided history: Fall.  Pain in right side. EXAM: CT HEAD WITHOUT CONTRAST CT CERVICAL SPINE WITHOUT CONTRAST TECHNIQUE: Multidetector CT imaging of the head  and cervical spine was performed following the standard protocol without intravenous contrast. Multiplanar CT image reconstructions of the cervical spine were also generated. RADIATION DOSE REDUCTION: This exam was performed according to the departmental dose-optimization program which includes automated exposure control, adjustment of the mA and/or kV according to patient size and/or use of  iterative reconstruction technique. COMPARISON:  Prior head CT examinations 08/31/2022 and earlier. FINDINGS: CT HEAD FINDINGS Brain: Generalized cerebral atrophy. Mild patchy and ill-defined hypoattenuation within the cerebral white matter, nonspecific but compatible with chronic small vessel disease. There is no acute intracranial hemorrhage. No demarcated cortical infarct. No extra-axial fluid collection. No evidence of an intracranial mass. No midline shift. Vascular: No hyperdense vessel.  Atherosclerotic calcifications. Skull: No fracture or aggressive osseous lesion. Sinuses/Orbits: No mass or acute finding within the imaged orbits. Large fluid level, and background mucosal thickening, within the partially imaged left maxillary sinus. CT CERVICAL SPINE FINDINGS Alignment: Nonspecific reversal of the expected cervical lordosis. Slight grade 1 anterolisthesis at C3-C4. 2 mm grade 1 anterolisthesis at C4-C5. Slight grade 1 anterolisthesis at C7-T1. Skull base and vertebrae: The basion-dental and atlanto-dental intervals are maintained.No evidence of acute fracture to the cervical spine. Vertebral ankylosis at C5-C6 and C6-C7. Soft tissues and spinal canal: No prevertebral fluid or swelling. No visible canal hematoma. Disc levels: Cervical spondylosis with multilevel disc bulges/central disc protrusions, uncovertebral hypertrophy and facet arthrosis. Disc space narrowing is greatest at the non fused levels at C7-T1 (advanced at this level). No appreciable high-grade spinal canal stenosis. Multilevel bony neural foraminal narrowing. Upper chest: No consolidation within the imaged lung apices. No visible pneumothorax. Other: Partially imaged prominent soft tissue stranding within the right lower neck, right chest and right upper back. IMPRESSION: CT head: 1. No evidence of acute intracranial abnormality. 2. Mild chronic small vessel ischemic changes within the cerebral white matter. 3. Cerebral atrophy. 4. Left  maxillary sinusitis. CT cervical spine: 1. No evidence of acute fracture to the cervical spine. 2. Partially imaged prominent soft tissue stranding within the right lower neck, right chest and right upper back. This likely reflects multiple hematomas. 3. Mild grade 1 anterolisthesis at C3-C4, C4-C5 and C7-T1. 4. Vertebral ankylosis at C5-C6 and C6-C7. 5. Cervical spondylosis. Electronically Signed   By: Kellie Simmering D.O.   On: 01/06/2023 10:16   CT Cervical Spine Wo Contrast  Result Date: 01/06/2023 CLINICAL DATA:  Provided history: Fall.  Pain in right side. EXAM: CT HEAD WITHOUT CONTRAST CT CERVICAL SPINE WITHOUT CONTRAST TECHNIQUE: Multidetector CT imaging of the head and cervical spine was performed following the standard protocol without intravenous contrast. Multiplanar CT image reconstructions of the cervical spine were also generated. RADIATION DOSE REDUCTION: This exam was performed according to the departmental dose-optimization program which includes automated exposure control, adjustment of the mA and/or kV according to patient size and/or use of iterative reconstruction technique. COMPARISON:  Prior head CT examinations 08/31/2022 and earlier. FINDINGS: CT HEAD FINDINGS Brain: Generalized cerebral atrophy. Mild patchy and ill-defined hypoattenuation within the cerebral white matter, nonspecific but compatible with chronic small vessel disease. There is no acute intracranial hemorrhage. No demarcated cortical infarct. No extra-axial fluid collection. No evidence of an intracranial mass. No midline shift. Vascular: No hyperdense vessel.  Atherosclerotic calcifications. Skull: No fracture or aggressive osseous lesion. Sinuses/Orbits: No mass or acute finding within the imaged orbits. Large fluid level, and background mucosal thickening, within the partially imaged left maxillary sinus. CT CERVICAL SPINE FINDINGS Alignment: Nonspecific reversal of the expected cervical lordosis.  Slight grade 1  anterolisthesis at C3-C4. 2 mm grade 1 anterolisthesis at C4-C5. Slight grade 1 anterolisthesis at C7-T1. Skull base and vertebrae: The basion-dental and atlanto-dental intervals are maintained.No evidence of acute fracture to the cervical spine. Vertebral ankylosis at C5-C6 and C6-C7. Soft tissues and spinal canal: No prevertebral fluid or swelling. No visible canal hematoma. Disc levels: Cervical spondylosis with multilevel disc bulges/central disc protrusions, uncovertebral hypertrophy and facet arthrosis. Disc space narrowing is greatest at the non fused levels at C7-T1 (advanced at this level). No appreciable high-grade spinal canal stenosis. Multilevel bony neural foraminal narrowing. Upper chest: No consolidation within the imaged lung apices. No visible pneumothorax. Other: Partially imaged prominent soft tissue stranding within the right lower neck, right chest and right upper back. IMPRESSION: CT head: 1. No evidence of acute intracranial abnormality. 2. Mild chronic small vessel ischemic changes within the cerebral white matter. 3. Cerebral atrophy. 4. Left maxillary sinusitis. CT cervical spine: 1. No evidence of acute fracture to the cervical spine. 2. Partially imaged prominent soft tissue stranding within the right lower neck, right chest and right upper back. This likely reflects multiple hematomas. 3. Mild grade 1 anterolisthesis at C3-C4, C4-C5 and C7-T1. 4. Vertebral ankylosis at C5-C6 and C6-C7. 5. Cervical spondylosis. Electronically Signed   By: Jackey Loge D.O.   On: 01/06/2023 10:16        Scheduled Meds:  carbidopa-levodopa  1 tablet Oral QHS   clopidogrel  75 mg Oral Daily   enoxaparin (LOVENOX) injection  40 mg Subcutaneous Daily   fluticasone furoate-vilanterol  1 puff Inhalation Daily   And   umeclidinium bromide  1 puff Inhalation Daily   furosemide  20 mg Oral Q0600   metoprolol succinate  100 mg Oral Daily   Continuous Infusions:  sodium chloride Stopped (01/07/23  0514)     LOS: 1 day   Time spent= 35 mins    Alonnah Lampkins Joline Maxcy, MD [facility]  If 7PM-7AM, please contact night-coverage  01/07/2023, 9:13 AM

## 2023-01-08 DIAGNOSIS — S42034D Nondisplaced fracture of lateral end of right clavicle, subsequent encounter for fracture with routine healing: Secondary | ICD-10-CM | POA: Diagnosis not present

## 2023-01-08 DIAGNOSIS — W19XXXD Unspecified fall, subsequent encounter: Secondary | ICD-10-CM

## 2023-01-08 LAB — CBC
HCT: 25.7 % — ABNORMAL LOW (ref 36.0–46.0)
Hemoglobin: 8.2 g/dL — ABNORMAL LOW (ref 12.0–15.0)
MCH: 30 pg (ref 26.0–34.0)
MCHC: 31.9 g/dL (ref 30.0–36.0)
MCV: 94.1 fL (ref 80.0–100.0)
Platelets: 158 10*3/uL (ref 150–400)
RBC: 2.73 MIL/uL — ABNORMAL LOW (ref 3.87–5.11)
RDW: 15.3 % (ref 11.5–15.5)
WBC: 7.8 10*3/uL (ref 4.0–10.5)
nRBC: 0 % (ref 0.0–0.2)

## 2023-01-08 LAB — BASIC METABOLIC PANEL
Anion gap: 7 (ref 5–15)
BUN: 17 mg/dL (ref 8–23)
CO2: 23 mmol/L (ref 22–32)
Calcium: 8.2 mg/dL — ABNORMAL LOW (ref 8.9–10.3)
Chloride: 108 mmol/L (ref 98–111)
Creatinine, Ser: 0.93 mg/dL (ref 0.44–1.00)
GFR, Estimated: 56 mL/min — ABNORMAL LOW (ref >60)
Glucose, Bld: 112 mg/dL — ABNORMAL HIGH (ref 70–99)
Potassium: 3.8 mmol/L (ref 3.5–5.1)
Sodium: 138 mmol/L (ref 135–145)

## 2023-01-08 LAB — MAGNESIUM: Magnesium: 2.1 mg/dL (ref 1.7–2.4)

## 2023-01-08 MED ORDER — PANTOPRAZOLE SODIUM 40 MG PO TBEC
40.0000 mg | DELAYED_RELEASE_TABLET | Freq: Every day | ORAL | Status: DC
  Administered 2023-01-08: 40 mg via ORAL
  Filled 2023-01-08: qty 1

## 2023-01-08 MED ORDER — MAGNESIUM HYDROXIDE 400 MG/5ML PO SUSP: 30.0000 mL | Freq: Every day | ORAL | Status: DC | PRN

## 2023-01-08 MED ORDER — OXYCODONE HCL 5 MG PO TABS: 5.0000 mg | ORAL_TABLET | Freq: Four times a day (QID) | ORAL | Status: DC | PRN

## 2023-01-08 MED ORDER — VITAMIN D 25 MCG (1000 UNIT) PO TABS
1000.0000 [IU] | ORAL_TABLET | Freq: Every day | ORAL | Status: DC
  Administered 2023-01-08 – 2023-01-10 (×3): 1000 [IU] via ORAL
  Filled 2023-01-08 (×3): qty 1

## 2023-01-08 MED ORDER — VITAMIN B-12 1000 MCG PO TABS
1000.0000 ug | ORAL_TABLET | Freq: Every day | ORAL | Status: DC
  Administered 2023-01-08: 1000 ug via ORAL
  Filled 2023-01-08: qty 1

## 2023-01-08 MED ORDER — ACETAMINOPHEN 500 MG PO TABS
1000.0000 mg | ORAL_TABLET | Freq: Four times a day (QID) | ORAL | Status: DC | PRN
  Administered 2023-01-08: 1000 mg via ORAL
  Filled 2023-01-08: qty 2

## 2023-01-08 MED ORDER — LATANOPROST 0.005 % OP SOLN
1.0000 [drp] | Freq: Every day | OPHTHALMIC | Status: DC
  Administered 2023-01-08 – 2023-01-09 (×2): 1 [drp] via OPHTHALMIC
  Filled 2023-01-08: qty 2.5

## 2023-01-08 NOTE — Evaluation (Signed)
Occupational Therapy Evaluation Patient Details Name: Sheila Long MRN: [id number] DOB: April 26, 1924 Today's Date: 01/08/2023   History of Present Illness Pt is a 89 y.o. female with PMH that includes PD, HTN, CHF, coronary artery disease, GERD, neuropathy, and sarcoidosis, presenting with recurrent falls and diagnosed with R clavicle and rib fractures as well as generalized weakness.   Clinical Impression   Patient presenting with decreased Ind in self care,balance, functional mobility/transfers, endurance, and safety awareness. Patient reports being mod I PTA. Patient currently functioning at min A for functional mobility with mod A for toilet transfer. Pt needing mod - max A for self care with multiple cues to maintain NWB on R UE throughout session.Pt would need 24/7 assistance for mobility and self care and with recent falls short term rehab is recommended before returning home.  Patient will benefit from acute OT to increase overall independence in the areas of ADLs, functional mobility, and safety awareness in order to safely discharge to next venue of care.      Recommendations for follow up therapy are one component of a multi-disciplinary discharge planning process, led by the attending physician.  Recommendations may be updated based on patient status, additional functional criteria and insurance authorization.   Follow Up Recommendations  Skilled nursing-short term rehab (<3 hours/day)     Assistance Recommended at Discharge Intermittent Supervision/Assistance  Patient can return home with the following A lot of help with walking and/or transfers;A lot of help with bathing/dressing/bathroom;Assistance with cooking/housework;Help with stairs or ramp for entrance;Assist for transportation    Functional Status Assessment  Patient has had a recent decline in their functional status and demonstrates the ability to make significant improvements in function in a reasonable and  predictable amount of time.  Equipment Recommendations  Other (comment) (defer to next venue of care)       Precautions / Restrictions Precautions Precautions: Fall Required Braces or Orthoses: Sling Restrictions Weight Bearing Restrictions: Yes RUE Weight Bearing: Non weight bearing Other Position/Activity Restrictions: Per ortho MD: May have finger, wrist, and elbow ROM to perform basic self care activities if needed. Otherwise, recommend remaining NWB in sling      Mobility Bed Mobility Overal bed mobility: Needs Assistance Bed Mobility: Supine to Sit     Supine to sit: Min assist     General bed mobility comments: for trunk support and to maintain NWB    Transfers Overall transfer level: Needs assistance Equipment used: 1 person hand held assist Transfers: Sit to/from Stand, Bed to chair/wheelchair/BSC Sit to Stand: Min assist     Step pivot transfers: Min assist, Mod assist            Balance Overall balance assessment: History of Falls, Needs assistance Sitting-balance support: Feet supported Sitting balance-Leahy Scale: Good Sitting balance - Comments: seated on toilet   Standing balance support: Single extremity supported, During functional activity Standing balance-Leahy Scale: Fair                             ADL either performed or assessed with clinical judgement   ADL Overall ADL's : Needs assistance/impaired                 Upper Body Dressing : Maximal assistance Upper Body Dressing Details (indicate cue type and reason): sling and clothing secondary to NWB and precautions Lower Body Dressing: Maximal assistance;Sit to/from stand Lower Body Dressing Details (indicate cue type and reason): assistance to thread onto  B feet and pull over B hips Toilet Transfer: Moderate assistance;Ambulation;Minimal assistance   Toileting- Clothing Manipulation and Hygiene: Maximal assistance Toileting - Clothing Manipulation Details (indicate  cue type and reason): assist for clothing management and hygiene after BM             Vision Baseline Vision/History: 1 Wears glasses Patient Visual Report: No change from baseline              Pertinent Vitals/Pain Pain Assessment Pain Assessment: No/denies pain     Hand Dominance Right   Extremity/Trunk Assessment Upper Extremity Assessment Upper Extremity Assessment: RUE deficits/detail;Generalized weakness RUE Deficits / Details: elbow, wrist, elbow ROM is WFLs but pt is NWB at this time RUE: Unable to fully assess due to immobilization   Lower Extremity Assessment Lower Extremity Assessment: Generalized weakness       Communication Communication Communication: HOH   Cognition Arousal/Alertness: Awake/alert Behavior During Therapy: WFL for tasks assessed/performed Overall Cognitive Status: Within Functional Limits for tasks assessed                                                  Home Living Family/patient expects to be discharged to:: Private residence Living Arrangements: Alone Available Help at Discharge: Neighbor;Personal care attendant;Available PRN/intermittently Type of Home: House Home Access: Stairs to enter [employer] of Steps: 3 Entrance Stairs-Rails: Right Home Layout: Multi-level;Laundry or work area in basement;Able to live on main level with bedroom/bathroom Alternate Therapist, sports of Steps: Neighbors help with essential needs in basement level of home   Bathroom Shower/Tub: Occupational psychologist: Standard Bathroom Accessibility: Yes   Home Equipment: Conservation officer, nature (2 wheels);Cane - single point;Wheelchair - manual;BSC/3in1;Grab bars - toilet;Grab bars - tub/shower;Hand held shower head;Adaptive equipment Adaptive Equipment: Reacher;Sock aid;Long-handled shoe horn;Long-handled sponge Additional Comments: Pt has PCA that assists with cleaning and meal prep      Prior  Functioning/Environment Prior Level of Function : Needs assist;History of Falls (last six months)             Mobility Comments: Mod Ind amb limited community distances with SPC, 2 falls in the last 6 months secondary to LOB ADLs Comments: Ind with ADLs, PCA assists with cleaning and meals        OT Problem List: Decreased strength;Pain;Decreased range of motion;Decreased activity tolerance;Decreased safety awareness;Impaired balance (sitting and/or standing);Decreased knowledge of use of DME or AE;Impaired UE functional use;Decreased knowledge of precautions      OT Treatment/Interventions: Self-care/ADL training;Therapeutic exercise;Therapeutic activities;Energy conservation;DME and/or AE instruction;Patient/family education;Balance training;Manual therapy    OT Goals(Current goals can be found in the care plan section) Acute Rehab OT Goals Patient Stated Goal: to return home OT Goal Formulation: With patient Time For Goal Achievement: 01/22/23 Potential to Achieve Goals: Fair ADL Goals Pt Will Perform Grooming: with supervision;standing Pt Will Perform Upper Body Dressing: with min assist;sitting Pt Will Perform Lower Body Dressing: with min assist;sit to/from stand Pt Will Transfer to Toilet: with min guard assist;ambulating Pt Will Perform Toileting - Clothing Manipulation and hygiene: with min guard assist;sit to/from stand  OT Frequency: Min 2X/week       AM-PAC OT "6 Clicks" Daily Activity     Outcome Measure Help from another person eating meals?: None Help from another person taking care of personal grooming?: A Little Help from another person toileting, which  includes using toliet, bedpan, or urinal?: A Lot Help from another person bathing (including washing, rinsing, drying)?: A Lot Help from another person to put on and taking off regular upper body clothing?: A Lot Help from another person to put on and taking off regular lower body clothing?: A Lot 6 Click  Score: 15   End of Session Nurse Communication: Mobility status  Activity Tolerance: Patient limited by fatigue Patient left: in chair;with chair alarm set;with call bell/phone within reach;with nursing/sitter in room  OT Visit Diagnosis: Unsteadiness on feet (R26.81);Repeated falls (R29.6);Muscle weakness (generalized) (M62.81);History of falling (Z91.81)                Time: 1275-1700 OT Time Calculation (min): 31 min Charges:  OT General Charges $OT Visit: 1 Visit OT Evaluation $OT Eval Moderate Complexity: 1 Mod OT Treatments $Self Care/Home Management : 8-22 mins  Darleen Crocker, MS, OTR/L , CBIS ascom [phone]  01/08/23, 12:52 PM

## 2023-01-08 NOTE — Progress Notes (Addendum)
PROGRESS NOTE   Sheila Long  NWG:[id number]    DOB: 04/01/1924    DOA: 01/06/2023  PCP: Enid Baas, MD   I have briefly reviewed patients previous medical records in [facility].  Chief Complaint  Patient presents with   Fall    Brief Narrative:  89 year old female, lives alone but reports that she has a caregiver who lives with her mostly 24/7, ambulates with a walker, PMH of Parkinson's disease, multiple falls, chronic Diastolic CHF, CAD, GERD, neuropathy, sarcoidosis, presented to the ED on 01/06/2023 following fall, right distal clavicular fracture and multiple right rib fractures.  Orthopedics consulted and recommend nonsurgical management for right distal clavicular fracture.  Awaiting PT and OT evaluation to determine discharge disposition.  Otherwise patient medically optimized for DC.   Assessment & Plan:  Principal Problem:   Fall Active Problems:   Parkinson disease   Hypertension   Stage 3a chronic kidney disease (CKD) (HCC)   Coronary artery disease   Pulmonary hypertension associated with sarcoidosis (HCC)   Generalized weakness   Multiple fractures   Multiple falls, right distal clavicular fracture and multiple right rib fractures: CK normal.  Vitamin D, 25-hydroxy: 31.03.  Orthopedics/Dr. Signa Kell input from 1/9 appreciated, he discussed both surgical and nonsurgical management with patient and patient wished to proceed with nonsurgical management.  RUE sling, may have finger, wrist, elbow range of movement to perform basic self-care activities if needed otherwise, recommend remaining nonweightbearing and sling and follow-up at [facility] orthopedics in approximately 2 weeks.  Awaiting PT and OT evaluation.  Patient reluctant to use opioid pain meds because it causes confusion.  Attempt to try known opioids before low-dose opioids.  Parkinson's disease: Noted resting right hand tremors.  This is likely the cause of her falls.  Continue Sinemet.  Stage  3a chronic kidney disease (CKD) (HCC) Creatinine is normal and at baseline.   Hypertension Stable, continue to monitor.  Continue home dose of Toprol-XL.  IV as needed   Coronary artery disease No active chest pain  Cont home regimen >Plavix and Toprol-XL   Generalized weakness Weakness workup as mentioned above.   Pulmonary hypertension associated with sarcoidosis (HCC) Appears stable from a resp standpoint.  As needed bronchodilators  Normocytic anemia: Stable.  Outpatient follow-up.  Hard of hearing: ?  Left her hearing aids at home  There is no height or weight on file to calculate BMI.   DVT prophylaxis: enoxaparin (LOVENOX) injection 40 mg Start: 01/07/23 0300 SCDs Start: 01/06/23 1931     Code Status: Full Code:  Family Communication: None at bedside Disposition:  Status is: Inpatient Remains inpatient appropriate because: Medically optimized for DC to next level of care pending PT and OT evaluation.     Consultants:   Orthopedics  Procedures:     Antimicrobials:      Subjective:  Reports that if she is sitting still, she feels okay but movement of her right shoulder causes pain.  Hesitant to take opioids due to prior history of causing confusion.  Rest of history as noted above.  Objective:   Vitals:   01/07/23 1245 01/07/23 1420 01/07/23 2150 01/08/23 0756  BP:  (!) 142/50 (!) 147/59 (!) 124/45  Pulse:  67 71 68  Resp:  18 17 16   Temp: 98.2 F (36.8 C) 98.8 F (37.1 C) 97.7 F (36.5 C) 98.3 F (36.8 C)  TempSrc: Oral     SpO2:  99% 98% 100%    General exam: Elderly female, moderately  built and nourished sitting up comfortably in reclining chair. Respiratory system: Clear to auscultation. Respiratory effort normal. Cardiovascular system: S1 & S2 heard, RRR. No JVD, murmurs, rubs, gallops or clicks. No pedal edema. Gastrointestinal system: Abdomen is nondistended, soft and nontender. No organomegaly or masses felt. Normal bowel sounds  heard. Central nervous system: Alert and oriented. No focal neurological deficits.  Extremely hard of hearing. Extremities: Symmetric 5 x 5 power.  Right upper extremity in a sling.  Has normal movements of right fingers and neurovascular bundle intact. Skin: No rashes, lesions or ulcers.  Small area of bruise over the right forehead/over the right lateral eyebrow. Psychiatry: Judgement and insight appear normal. Mood & affect appropriate.     Data Reviewed:   I have personally reviewed following labs and imaging studies   CBC: Recent Labs  Lab 01/06/23 1549 01/06/23 2046 01/07/23 0438 01/08/23 0238  WBC 7.9 6.6 7.3 7.8  NEUTROABS 4.6  --  4.1  --   HGB 9.2* 8.8* 8.5* 8.2*  HCT 29.5* 28.0* 27.2* 25.7*  MCV 96.1 94.9 97.1 94.1  PLT 184 174 156 235    Basic Metabolic Panel: Recent Labs  Lab 01/06/23 1549 01/06/23 2046 01/07/23 0438 01/08/23 0238  NA 140  --  140 138  K 3.8  --  3.7 3.8  CL 106  --  110 108  CO2 27  --  27 23  GLUCOSE 120*  --  132* 112*  BUN 24*  --  20 17  CREATININE 1.08* 0.95 0.96 0.93  CALCIUM 8.9  --  8.6* 8.2*  MG  --   --   --  2.1    Liver Function Tests: Recent Labs  Lab 01/07/23 0438  AST 14*  ALT <5  ALKPHOS 57  BILITOT 1.2  PROT 5.7*  ALBUMIN 3.2*    CBG: No results for input(s): "GLUCAP" in the last 168 hours.  Microbiology Studies:  No results found for this or any previous visit (from the past 240 hour(s)).  Radiology Studies:  CT CHEST ABDOMEN PELVIS W CONTRAST  Result Date: 01/06/2023 CLINICAL DATA:  Fall while using rocker.  Right-sided pain. EXAM: CT CHEST, ABDOMEN, AND PELVIS WITH CONTRAST TECHNIQUE: Multidetector CT imaging of the chest, abdomen and pelvis was performed following the standard protocol during bolus administration of intravenous contrast. RADIATION DOSE REDUCTION: This exam was performed according to the departmental dose-optimization program which includes automated exposure control, adjustment of the  mA and/or kV according to patient size and/or use of iterative reconstruction technique. CONTRAST:  179mL OMNIPAQUE IOHEXOL 300 MG/ML  SOLN COMPARISON:  Chest CT 06/05/2022. FINDINGS: CT CHEST FINDINGS Cardiovascular: Coronary, aortic arch, and branch vessel atherosclerotic vascular disease. Mild cardiomegaly. Mediastinum/Nodes: Small to moderate-sized hiatal hernia. Lungs/Pleura: Mild biapical pleuroparenchymal scarring. Mild dependent atelectasis in both lungs with some airway plugging in both lower lobes and bilateral airway thickening. Stable mild nodularity in the right middle lobe likely from old granulomatous disease, no change from 2018, benign. Musculoskeletal: Displaced right distal clavicular fracture extending towards but not within the [facility] joint. Mild comminution and surrounding hematoma. Substantial degenerative glenohumeral arthropathy bilaterally. Relatively nondisplaced fractures of the right second through sixth ribs, with the right second nondisplaced rib fractures being segmental. CT ABDOMEN PELVIS FINDINGS Hepatobiliary: Unremarkable Pancreas: Unremarkable Spleen: Unremarkable Adrenals/Urinary Tract: Distal ureters in urinary bladder obscured by streak artifact from the hip implants and barium in the rectum. Bosniak category 2 cyst of the left kidney measures 2.7 cm in diameter on image  63 of series 2 and appears to have a thin faint inferior septation. No further imaging workup of this lesion is indicated. Adrenal glands unremarkable. Stomach/Bowel: Sigmoid colon diverticulosis with scattered barium along sigmoid colon diverticula. There is also some dense barium in the rectum. Vascular/Lymphatic: Atherosclerosis is present, including aortoiliac atherosclerotic disease. Reproductive: Uterus is thought to be absent. Region obscured by streak artifact. Other: No supplemental non-categorized findings. Musculoskeletal: Bilateral total hip prostheses. Bony demineralization. Lumbar spondylosis and  degenerative disc disease with suspected mild degrees of impingement at L2-3, L3-4, and L4-5. IMPRESSION: 1. Displaced right distal clavicular fracture extending towards but not within the [facility] joint. Surrounding hematoma. 2. Relatively nondisplaced fractures of the right second through sixth ribs, with the right second nondisplaced rib fractures being segmental. 3. No acute findings in the abdomen/pelvis. 4. Airway thickening with some airway plugging in both lower lobes, query bronchitis or reactive airways disease. 5. Small to moderate-sized hiatal hernia. 6. Bony demineralization. 7. Lumbar spondylosis and degenerative disc disease with suspected mild degrees of impingement at L2-3, L3-4, and L4-5. 8. Bosniak category 2 cyst of the left kidney measures 2.7 cm in diameter and appears to have a thin faint inferior septation. No further imaging workup of this lesion is indicated. 9. Aortic atherosclerosis. Aortic Atherosclerosis (ICD10-I70.0). Electronically Signed   By: Gaylyn Rong M.D.   On: 01/06/2023 18:18   DG Hip Unilat W or Wo Pelvis 2-3 Views Right  Result Date: 01/06/2023 CLINICAL DATA:  Trauma, fall EXAM: DG HIP (WITH OR WITHOUT PELVIS) 2-3V RIGHT COMPARISON:  None Available. FINDINGS: There is previous arthroplasty in both hips. No recent fracture is seen. There is barium in the rectum which is partly obscuring the pubic bones. There are smooth marginated calcifications medial to the lesser trochanter, possibly calcifications from previous injury or previous surgery. Scattered arterial calcifications are seen. There is high density, possibly barium in rectum and numerous diverticula in left colon. IMPRESSION: Previous arthroplasty in both hips. No recent fracture or dislocation is seen. Pubic bones are partly obscured by dense contrast in the rectum limiting evaluation. Electronically Signed   By: Ernie Avena M.D.   On: 01/06/2023 11:04   DG Ribs Unilateral W/Chest Right  Result Date:  01/06/2023 CLINICAL DATA:  Trauma, fall EXAM: RIGHT RIBS AND CHEST - 3+ VIEW COMPARISON:  None Available. FINDINGS: Transverse diameter of heart is increased. Calcifications are seen in thoracic aorta. There are no signs of pulmonary edema or focal pulmonary consolidation. There is no pleural effusion or pneumothorax. Minimal cortical irregularity is seen in the anterior ends of right ninth and tenth ribs. Osteopenia is seen in bony structures. IMPRESSION: There is minimal cortical irregularity in the anterior ends of right ninth and tenth ribs suggesting recent or old undisplaced fractures. There is no evidence of focal pulmonary contusion. There is no pleural effusion or pneumothorax. Cardiomegaly.  Aortic atherosclerosis. Electronically Signed   By: Ernie Avena M.D.   On: 01/06/2023 11:00   DG Shoulder Right  Result Date: 01/06/2023 CLINICAL DATA:  Trauma, fall EXAM: RIGHT SHOULDER - 2+ VIEW COMPARISON:  None Available. FINDINGS: There is deformity in the lateral end of right clavicle suggesting possible recent fracture. In 1 of the images, there is cortical irregularity in the upper lateral aspect of right scapula which could not be seen in the rest of the images. There is subcortical cyst in the upper aspect of the glenoid. No fracture line is seen in right humerus. Degenerative changes are noted in glenohumeral  joint with bony spurs and joint space narrowing. IMPRESSION: There is deformity in the lateral end of right clavicle suggesting recent fracture. There is deformity in the upper lateral aspect of scapula seen in a single view. Possibility of fracture in scapula is not excluded. If clinically warranted, follow-up CT may be considered. Degenerative changes are noted in right shoulder. Electronically Signed   By: Elmer Picker M.D.   On: 01/06/2023 10:58    Scheduled Meds:    carbidopa-levodopa  1 tablet Oral QHS   clopidogrel  75 mg Oral Daily   enoxaparin (LOVENOX) injection  40 mg  Subcutaneous Daily   ferrous sulfate  325 mg Oral Q breakfast   fluticasone furoate-vilanterol  1 puff Inhalation Daily   And   umeclidinium bromide  1 puff Inhalation Daily   metoprolol succinate  100 mg Oral Daily    Continuous Infusions:     LOS: 2 days     Vernell Leep, MD,  FACP, FHM, SFHM, [facility], [place]     To contact the attending provider between 7A-7P or the covering provider during after hours 7P-7A, please log into the web site [email or web address] and access using universal [place] password for that web site. If you do not have the password, please call the hospital operator.  01/08/2023, 10:36 AM

## 2023-01-08 NOTE — Evaluation (Signed)
Physical Therapy Evaluation Patient Details Name: Sheila Long MRN: [id number] DOB: [birth year] Today's Date: 01/08/2023  History of Present Illness  Pt is a 89 y.o. female with PMH that includes PD, HTN, CHF, coronary artery disease, GERD, neuropathy, and sarcoidosis, presenting with recurrent falls and diagnosed with R clavicle and rib fractures as well as generalized weakness.   Clinical Impression  Pt was pleasant and motivated to participate during the session and put forth good effort throughout. Pt required min A and cuing for sequencing with transfers and was able to amb 12 feet with a HW with multi-modal cuing for sequencing.  Pt put good effort into below therex and reported no adverse symptoms during the session other than minimal R rib pain only when going from sit to stand.  Pt's SpO2 and HR were both WNL on room air.  Pt will benefit from PT services in a SNF setting upon discharge to safely address deficits listed in patient problem list for decreased caregiver assistance and eventual return to PLOF.          Recommendations for follow up therapy are one component of a multi-disciplinary discharge planning process, led by the attending physician.  Recommendations may be updated based on patient status, additional functional criteria and insurance authorization.  Follow Up Recommendations Skilled nursing-short term rehab (<3 hours/day) Can patient physically be transported by private vehicle: No    Assistance Recommended at Discharge Frequent or constant Supervision/Assistance  Patient can return home with the following  A lot of help with walking and/or transfers;A lot of help with bathing/dressing/bathroom;Assistance with cooking/housework;Direct supervision/assist for medications management;Assist for transportation;Help with stairs or ramp for entrance    Equipment Recommendations Other (comment) (TBD at next venue of care)  Recommendations for Other Services        Functional Status Assessment Patient has had a recent decline in their functional status and demonstrates the ability to make significant improvements in function in a reasonable and predictable amount of time.     Precautions / Restrictions Precautions Precautions: Fall Required Braces or Orthoses: Sling Restrictions Weight Bearing Restrictions: Yes RUE Weight Bearing: Non weight bearing Other Position/Activity Restrictions: Per ortho MD: May have finger, wrist, and elbow ROM to perform basic self care activities if needed. Otherwise, recommend remaining NWB in sling      Mobility  Bed Mobility               General bed mobility comments: NT, pt in recliner    Transfers Overall transfer level: Needs assistance Equipment used: Hemi-walker Transfers: Sit to/from Stand Sit to Stand: Min assist           General transfer comment: Mod verbal cues for increased trunk flexion and hand placement    Ambulation/Gait Ambulation/Gait assistance: Min guard Gait Distance (Feet): 12 Feet Assistive device: Hemi-walker Gait Pattern/deviations: Step-to pattern, Shuffle Gait velocity: decreased     General Gait Details: Very slow, effortful cadence with occasional shuffling but no overt LOB, mod verbal and visual cues for sequencing with the [facility]  Stairs            Wheelchair Mobility    Modified Rankin (Stroke Patients Only)       Balance Overall balance assessment: History of Falls, Needs assistance     Sitting balance - Comments: Unsupported sitting not tested   Standing balance support: Single extremity supported, During functional activity Standing balance-Leahy Scale: Fair  Pertinent Vitals/Pain Pain Assessment Pain Assessment: No/denies pain    Home Living Family/patient expects to be discharged to:: Private residence Living Arrangements: Alone Available Help at Discharge: Neighbor;Personal care  attendant;Available PRN/intermittently Type of Home: House Home Access: Stairs to enter Entrance Stairs-Rails: Right Entrance Stairs-Number of Steps: 3 Alternate Level Stairs-Number of Steps: Neighbors help with essential needs in basement level of home Home Layout: Multi-level;Laundry or work area in basement;Able to live on main level with bedroom/bathroom Home Equipment: Agricultural consultant (2 wheels);Cane - single point;Wheelchair - manual;BSC/3in1;Grab bars - toilet;Grab bars - tub/shower;Hand held shower head;Adaptive equipment, lift chair  Additional Comments: Pt has PCA that assists with cleaning and meal prep    Prior Function Prior Level of Function : Needs assist;History of Falls (last six months)             Mobility Comments: Mod Ind amb limited community distances with SPC, 2 falls in the last 6 months secondary to LOB ADLs Comments: Ind with ADLs, PCA assists with cleaning and meals     Hand Dominance   Dominant Hand: Right    Extremity/Trunk Assessment   Upper Extremity Assessment Upper Extremity Assessment: Defer to OT evaluation;RUE deficits/detail RUE: Unable to fully assess due to immobilization    Lower Extremity Assessment Lower Extremity Assessment: Generalized weakness       Communication   Communication: HOH  Cognition Arousal/Alertness: Awake/alert Behavior During Therapy: WFL for tasks assessed/performed Overall Cognitive Status: Within Functional Limits for tasks assessed                                          General Comments      Exercises Total Joint Exercises Ankle Circles/Pumps: AROM, Strengthening, Both, 10 reps Quad Sets: Strengthening, Both, 10 reps Gluteal Sets: Strengthening, Both, 10 reps Hip ABduction/ADduction: AAROM, Strengthening, Both, 5 reps Straight Leg Raises: AAROM, Strengthening, Both, 5 reps Long Arc Quad: Strengthening, Both, 10 reps Knee Flexion: Strengthening, Both, 10 reps Other  Exercises Other Exercises: HEP education for BLE APs, QS, GS, and LAQs x 10 each every 1-2 hours daily   Assessment/Plan    PT Assessment Patient needs continued PT services  PT Problem List Decreased strength;Decreased activity tolerance;Decreased balance;Decreased mobility;Decreased knowledge of use of DME       PT Treatment Interventions DME instruction;Gait training;Stair training;Functional mobility training;Therapeutic activities;Therapeutic exercise;Balance training;Patient/family education    PT Goals (Current goals can be found in the Care Plan section)  Acute Rehab PT Goals Patient Stated Goal: Improved balance and strength PT Goal Formulation: With patient Time For Goal Achievement: 01/21/23 Potential to Achieve Goals: Good    Frequency 7X/week     Co-evaluation               AM-PAC PT "6 Clicks" Mobility  Outcome Measure Help needed turning from your back to your side while in a flat bed without using bedrails?: A Lot Help needed moving from lying on your back to sitting on the side of a flat bed without using bedrails?: A Lot Help needed moving to and from a bed to a chair (including a wheelchair)?: A Lot Help needed standing up from a chair using your arms (e.g., wheelchair or bedside chair)?: A Little Help needed to walk in hospital room?: A Little Help needed climbing 3-5 steps with a railing? : A Lot 6 Click Score: 14    End of Session  Equipment Utilized During Treatment: Gait belt;Other (comment) (RUE sling) Activity Tolerance: Patient tolerated treatment well Patient left: in chair;with call bell/phone within reach;with chair alarm set;with SCD's reapplied Nurse Communication: Mobility status;Weight bearing status PT Visit Diagnosis: History of falling (Z91.81);Unsteadiness on feet (R26.81);Muscle weakness (generalized) (M62.81);Difficulty in walking, not elsewhere classified (R26.2)    Time: 8937-3428 PT Time Calculation (min) (ACUTE ONLY): 30  min   Charges:   PT Evaluation $PT Eval Moderate Complexity: 1 Mod PT Treatments $Therapeutic Exercise: 8-22 mins       D. Royetta Asal PT, DPT 01/08/23, 12:18 PM

## 2023-01-08 NOTE — TOC Progression Note (Signed)
Transition of Care [facility]) - Progression Note    Patient Details  Name: Sheila Long MRN: [id number] Date of Birth: November 17, 1924  Transition of Care [facility]) CM/SW [place], LCSW Phone Number: 01/08/2023, 3:56 PM  Clinical Narrative:    CSW went to discuss SNF rec for discharge.  Pt wasn't quite sure what she wanted to do, but she did ask for referrals to be made (doesn't want [facility] or [facility]).  She spoke with her neighbor, Brunell while CSW was in the room.  Brunell stated that py would have support if she decides to go home.  CSW will place referrals and discuss with pt in the morning   Expected Discharge Plan: [place] Barriers to Discharge: Continued Medical Work up  Expected Discharge Plan and [place] arrangements for the past 2 months: Single Family Home                                       Social Determinants of Health (SDOH) Interventions SDOH Screenings   Food Insecurity: No Food Insecurity (01/07/2023)  Housing: Low Risk  (01/07/2023)  Transportation Needs: No Transportation Needs (01/07/2023)  Utilities: Not At Risk (01/07/2023)  Alcohol Screen: Low Risk  (10/11/2021)  Depression (PHQ2-9): Low Risk  (10/11/2021)  Financial Resource Strain: Medium Risk (10/11/2021)  Physical Activity: Inactive (10/11/2021)  Social Connections: Moderately Integrated (10/11/2021)  Stress: No Stress Concern Present (10/11/2021)  Tobacco Use: Low Risk  (01/07/2023)    Readmission Risk Interventions     No data to display

## 2023-01-08 NOTE — Plan of Care (Signed)
  Problem: Education: Goal: Knowledge of General Education information will improve Description: Including pain rating scale, medication(s)/side effects and non-pharmacologic comfort measures Outcome: Progressing   Problem: Health Behavior/Discharge Planning: Goal: Ability to manage health-related needs will improve Outcome: Progressing   Problem: Clinical Measurements: Goal: Ability to maintain clinical measurements within normal limits will improve Outcome: Progressing Goal: Diagnostic test results will improve Outcome: Progressing   Problem: Activity: Goal: Risk for activity intolerance will decrease Outcome: Progressing   Problem: Nutrition: Goal: Adequate nutrition will be maintained Outcome: Progressing   Problem: Elimination: Goal: Will not experience complications related to bowel motility Outcome: Progressing Goal: Will not experience complications related to urinary retention Outcome: Progressing   Problem: Pain Managment: Goal: General experience of comfort will improve Outcome: Progressing   Problem: Safety: Goal: Ability to remain free from injury will improve Outcome: Progressing

## 2023-01-08 NOTE — TOC Initial Note (Signed)
Transition of Care [facility]) - Initial/Assessment Note    Patient Details  Name: Sheila Long MRN: [id number] Date of Birth: October 27, 1924  [facility]) CM/SW Contact:    Quin Hoop, LCSW Phone Number: 01/08/2023, 3:09 PM  Clinical Narrative:    TOC assessment completed by pt's sister and confirmed by pt.  Pt's PCP is Dr. Tressia Miners.  She is transported to her medical appts by her neighbor, Malachi Carl.  [facility] and she has no concerns with getting her medications.  Pt currently lives alone, but she states that she has someone who comes and helps her for most of the day.  She states that her neighbor, Sharlett Iles, helps her as well.    DME equipment at home:  walker, cane, rollator, BSC, shower chair.  Recommendation is for SNF.  CSW will discuss with patient and assist with facility search.             Expected Discharge Plan: Skilled Nursing Facility Barriers to Discharge: Continued Medical Work up   Patient Goals and CMS Choice Patient states their goals for this hospitalization and ongoing recovery are:: to return home CMS Medicare.gov Compare Post Acute Care list provided to::  (to be provided)        Expected Discharge Plan and Services SNF       Living arrangements for the past 2 months: Single Family Home                                      Prior Living Arrangements/Services Living arrangements for the past 2 months: Single Family Home Lives with:: Self          Need for Family Participation in Patient Care: (P) Yes (Comment) (CSW spoke with pts sister to complete assessment)     Criminal Activity/Legal Involvement Pertinent to Current Situation/Hospitalization: No - Comment as needed  Activities of Daily Living Home Assistive Devices/Equipment: Environmental consultant (specify type), Eyeglasses ADL Screening (condition at time of admission) Patient's cognitive ability adequate to safely complete daily activities?:  Yes Is the patient deaf or have difficulty hearing?: No Does the patient have difficulty seeing, even when wearing glasses/contacts?: No Does the patient have difficulty concentrating, remembering, or making decisions?: No Patient able to express need for assistance with ADLs?: Yes Does the patient have difficulty dressing or bathing?: No Independently performs ADLs?: Yes (appropriate for developmental age) Does the patient have difficulty walking or climbing stairs?: Yes Weakness of Legs: Both Weakness of Arms/Hands: Both  Permission Sought/Granted                  Emotional Assessment Appearance:: Developmentally appropriate Attitude/Demeanor/Rapport: Engaged Affect (typically observed): Appropriate Orientation: : Oriented to Self, Oriented to Place, Oriented to  Time, Oriented to Situation Alcohol / Substance Use: Not Applicable Psych Involvement: No (comment)  Admission diagnosis:  Fall [W19.XXXA] Fall, initial encounter [id number]] Closed fracture of multiple ribs of right side, initial encounter [S22.41XA] Closed nondisplaced fracture of acromial end of right clavicle, initial encounter [S42.034A] Patient Active Problem List   Diagnosis Date Noted   Fall 01/06/2023   Multiple fractures 01/06/2023   COVID-19 virus infection 08/28/2022   Essential hypertension 08/28/2022   Acute kidney injury superimposed on chronic kidney disease ([place]) 08/28/2022   GERD with esophagitis 08/28/2022   Generalized weakness    UTI (urinary tract infection) 08/27/2022   Acute on chronic congestive  heart failure [facility])    Hospice care    Acute heart failure with preserved ejection fraction ([place]) 06/02/2022   Obesity (BMI 30-39.9) 06/02/2022   Stage 3a chronic kidney disease (CKD) ([place]) 06/01/2022   Atypical chest pain 06/01/2022   Acute on chronic anemia 04/21/2022   Parkinson disease 04/21/2022   Leg swelling 04/21/2022   Dizziness 01/15/2021   Carpal tunnel syndrome 12/11/2020   PND  (post-nasal drip)    Stenosis of right carotid artery    Acute kidney injury superimposed on CKD ([place])    Elevated troponin 11/18/2020   [name] 11/17/2020   Back pain without sciatica 11/07/2020   Numbness in both hands 11/07/2020   Numbness 10/05/2019   Axillary abscess 11/12/2018   Status post total replacement of both hips 10/04/2018   Prediabetes 10/30/2017   Acute on chronic diastolic CHF (congestive heart failure) ([place]) 10/29/2017   Fatigue 07/29/2017   Mood disorder ([place]) 07/29/2017   Constipation 06/26/2017   S/P total knee arthroplasty 06/23/2017   Pulmonary hypertension associated with sarcoidosis ([place]) 02/03/2017   Lower extremity edema 11/28/2016   Familial tremor 09/24/2016   Hypertension    Allergic rhinitis due to pollen    Coronary artery disease    Aortic atherosclerosis ([facility])    GERD (gastroesophageal reflux disease)    PAD (peripheral artery disease) ([place])    Osteoarthritis of both knees    Obstructive sleep apnea    Neuropathy    Dyslipidemia 11/19/2013   Thyroid nodule 11/19/2013   PCP:  Gladstone Lighter, MD Pharmacy:   [id number] - [facility] - [place], [address] [address] Phone: [phone] Fax: [phone]  [facility] [place] [place] [address] [place] [address] Phone: [phone] Fax: [phone]     Social Determinants of Health (SDOH) Social History: [place]: No Food Insecurity (01/07/2023)  Housing: Low Risk  (01/07/2023)  Transportation Needs: No Transportation Needs (01/07/2023)  Utilities: Not At Risk (01/07/2023)  Alcohol Screen: Low Risk  (10/11/2021)  Depression (PHQ2-9): Low Risk  (10/11/2021)  Financial Resource Strain: Medium Risk (10/11/2021)  Physical Activity: Inactive (10/11/2021)  Social Connections: Moderately Integrated (10/11/2021)  Stress: No Stress Concern Present (10/11/2021)  Tobacco  Use: Low Risk  (01/07/2023)   SDOH Interventions:     Readmission Risk Interventions     No data to display

## 2023-01-09 DIAGNOSIS — S42034D Nondisplaced fracture of lateral end of right clavicle, subsequent encounter for fracture with routine healing: Secondary | ICD-10-CM | POA: Diagnosis not present

## 2023-01-09 DIAGNOSIS — W19XXXD Unspecified fall, subsequent encounter: Secondary | ICD-10-CM | POA: Diagnosis not present

## 2023-01-09 LAB — MAGNESIUM: Magnesium: 2 mg/dL (ref 1.7–2.4)

## 2023-01-09 MED ORDER — ACETAMINOPHEN 325 MG PO TABS
650.0000 mg | ORAL_TABLET | Freq: Four times a day (QID) | ORAL | Status: DC | PRN
  Administered 2023-01-09: 650 mg via ORAL
  Filled 2023-01-09: qty 2

## 2023-01-09 MED ORDER — ACETAMINOPHEN-CODEINE 300-30 MG PO TABS
1.0000 | ORAL_TABLET | Freq: Three times a day (TID) | ORAL | Status: DC | PRN
  Administered 2023-01-10: 1 via ORAL
  Filled 2023-01-09 (×3): qty 1

## 2023-01-09 MED ORDER — PANTOPRAZOLE SODIUM 40 MG PO TBEC
40.0000 mg | DELAYED_RELEASE_TABLET | Freq: Two times a day (BID) | ORAL | Status: DC
  Administered 2023-01-09 – 2023-01-10 (×2): 40 mg via ORAL
  Filled 2023-01-09 (×2): qty 1

## 2023-01-09 NOTE — Progress Notes (Signed)
Occupational Therapy Treatment Patient Details Name: Sheila Long MRN: [id number] DOB: 20-Aug-1924 Today's Date: 01/09/2023   History of present illness Pt is a 89 y.o. female with PMH that includes PD, HTN, CHF, coronary artery disease, GERD, neuropathy, and sarcoidosis, presenting with recurrent falls and diagnosed with R clavicle and rib fractures as well as generalized weakness.   OT comments  Upon entering the room, pt seated in recliner chair and agreeable to OT intervention. Pt reports being undecided about rehab vs home. OT educating pt on sling use and needing someone to be able to adjust, don, and doff sling at home for all self care tasks. OT demonstrated and pt able to perform hand/wrist/elbow ROM x 10 reps each on R UE without pain with assistance from therapist to keep UE at her side. OT again reviewing precautions with pt while discussing UB clothing management. Pt attempting to return demonstrations and needing max A to manage simulated UB clothing management and sling. These are all things needing to be managed by another person essentially requiring 24/7 assistance at home if pt were to return. Ultimately, rehab remains the best recommendation to address functional deficits before returning home. Pt also instructed in use of incentive spirometer and pt demonstrates 5 reps before therapist exits the room. Chair alarm activated.    Recommendations for follow up therapy are one component of a multi-disciplinary discharge planning process, led by the attending physician.  Recommendations may be updated based on patient status, additional functional criteria and insurance authorization.    Follow Up Recommendations  Skilled nursing-short term rehab (<3 hours/day)     Assistance Recommended at Discharge Frequent or constant Supervision/Assistance  Patient can return home with the following  A lot of help with walking and/or transfers;A lot of help with  bathing/dressing/bathroom;Assistance with cooking/housework;Help with stairs or ramp for entrance;Assist for transportation   Equipment Recommendations  Other (comment) (defer to next venue of care)       Precautions / Restrictions Precautions Precautions: Fall Required Braces or Orthoses: Sling Restrictions Weight Bearing Restrictions: Yes RUE Weight Bearing: Non weight bearing Other Position/Activity Restrictions: Per ortho MD: May have finger, wrist, and elbow ROM to perform basic self care activities if needed. Otherwise, recommend remaining NWB in sling       Mobility Bed Mobility               General bed mobility comments: seated in recliner chair at beginning/end of session    Transfers                             ADL either performed or assessed with clinical judgement   ADL Overall ADL's : Needs assistance/impaired                 Upper Body Dressing : Maximal assistance Upper Body Dressing Details (indicate cue type and reason): sling and clothing secondary to NWB and precautions                   General ADL Comments: education performed on management of sling and UB self care with precautions    Extremity/Trunk Assessment Upper Extremity Assessment RUE Deficits / Details: elbow, wrist, elbow ROM is WFLs but pt is NWB at this time            Vision Patient Visual Report: No change from baseline            Cognition Arousal/Alertness: Awake/alert Behavior During  Therapy: WFL for tasks assessed/performed Overall Cognitive Status: Within Functional Limits for tasks assessed                                                     Pertinent Vitals/ Pain       Pain Assessment Pain Assessment: Faces Faces Pain Scale: Hurts a little bit Pain Location: back Pain Descriptors / Indicators: Discomfort, Sore Pain Intervention(s): Monitored during session         Frequency  Min 2X/week         Progress Toward Goals  OT Goals(current goals can now be found in the care plan section)  Progress towards OT goals: Progressing toward goals  Acute Rehab OT Goals Patient Stated Goal: to return home OT Goal Formulation: With patient Time For Goal Achievement: 01/22/23 Potential to Achieve Goals: [place] Discharge plan remains appropriate;Frequency remains appropriate       AM-PAC OT "6 Clicks" Daily Activity     Outcome Measure   Help from another person eating meals?: None Help from another person taking care of personal grooming?: A Little Help from another person toileting, which includes using toliet, bedpan, or urinal?: A Lot Help from another person bathing (including washing, rinsing, drying)?: A Lot Help from another person to put on and taking off regular upper body clothing?: A Lot Help from another person to put on and taking off regular lower body clothing?: A Lot 6 Click Score: 15    End of Session Equipment Utilized During Treatment: Rolling walker (2 wheels)  OT Visit Diagnosis: Unsteadiness on feet (R26.81);Repeated falls (R29.6);Muscle weakness (generalized) (M62.81);History of falling (Z91.81)   Activity Tolerance Patient limited by fatigue   Patient Left in chair;with chair alarm set;with call bell/phone within reach;with nursing/sitter in room   Nurse Communication Mobility status        Time: 1030-1045 OT Time Calculation (min): 15 min  Charges: OT General Charges $OT Visit: 1 Visit OT Treatments $Self Care/Home Management : 8-22 mins  Darleen Crocker, MS, OTR/L , CBIS ascom [phone]  01/09/23, 11:36 AM

## 2023-01-09 NOTE — NC FL2 (Signed)
[place] LEVEL OF CARE FORM     IDENTIFICATION  Patient Name: Sheila Long Birthdate: 12-Oct-1924 Sex: female Admission Date (Current Location): 01/06/2023  [facility] and [place] Number:  Engineering geologist and Address:  [facility], [address], [place], [place] [address]      Provider Number:    Attending Physician Name and Address:  Modena Jansky, MD  Relative Name and Phone Number:  Cleon Gustin, sister,  [phone]       Veronda Prude, [phone]    Current Level of Care: Hospital Recommended Level of Care: [place] Prior Approval Number:    Date Approved/Denied:   PASRR Number: [id number]  Discharge Plan: SNF    Current Diagnoses: Patient Active Problem List   Diagnosis Date Noted   Fall 01/06/2023   Multiple fractures 01/06/2023   COVID-19 virus infection 08/28/2022   Essential hypertension 08/28/2022   Acute kidney injury superimposed on chronic kidney disease ([place]) 08/28/2022   GERD with esophagitis 08/28/2022   Generalized weakness    UTI (urinary tract infection) 08/27/2022   Acute on chronic congestive heart failure [facility])    Hospice care    Acute heart failure with preserved ejection fraction ([place]) 06/02/2022   Obesity (BMI 30-39.9) 06/02/2022   Stage 3a chronic kidney disease (CKD) ([place]) 06/01/2022   Atypical chest pain 06/01/2022   Acute on chronic anemia 04/21/2022   Parkinson disease 04/21/2022   Leg swelling 04/21/2022   Dizziness 01/15/2021   Carpal tunnel syndrome 12/11/2020   PND (post-nasal drip)    Stenosis of right carotid artery    Acute kidney injury superimposed on CKD ([place])    Elevated troponin 11/18/2020   [name] 11/17/2020   Back pain without sciatica 11/07/2020   Numbness in both hands 11/07/2020   Numbness 10/05/2019   Axillary abscess 11/12/2018   Status post total replacement of both hips 10/04/2018   Prediabetes 10/30/2017   Acute on  chronic diastolic CHF (congestive heart failure) ([place]) 10/29/2017   Fatigue 07/29/2017   Mood disorder ([place]) 07/29/2017   Constipation 06/26/2017   S/P total knee arthroplasty 06/23/2017   Pulmonary hypertension associated with sarcoidosis ([place]) 02/03/2017   Lower extremity edema 11/28/2016   Familial tremor 09/24/2016   Hypertension    Allergic rhinitis due to pollen    Coronary artery disease    Aortic atherosclerosis (HCC)    GERD (gastroesophageal reflux disease)    PAD (peripheral artery disease) (HCC)    Osteoarthritis of both knees    Obstructive sleep apnea    Neuropathy    Dyslipidemia 11/19/2013   Thyroid nodule 11/19/2013    Orientation RESPIRATION BLADDER Height & Weight     Self, Time, Situation, Place  Normal Continent Weight:   Height:     BEHAVIORAL SYMPTOMS/MOOD NEUROLOGICAL BOWEL NUTRITION STATUS     (n/a) Continent Diet, Supplemental (Heart healthy)  AMBULATORY STATUS COMMUNICATION OF NEEDS Skin   Limited Assist Verbally  (dry skin)                       Personal Care Assistance Level of Assistance  Bathing, Feeding, Dressing Bathing Assistance: Limited assistance Feeding assistance: Limited assistance Dressing Assistance: Limited assistance     Functional Limitations Info   (WNL)          SPECIAL CARE FACTORS FREQUENCY  PT (By licensed PT), OT (By licensed OT)     PT Frequency: 5x's/week OT Frequency: 5x's/week  Contractures Contractures Info: Not present    Additional Factors Info  Code Status, Allergies Code Status Info: full Allergies Info: Sulfa Antibiotics, Sulfa Antibiotics, Tramadol           Current Medications (01/09/2023):  This is the current hospital active medication list Current Facility-Administered Medications  Medication Dose Route Frequency Provider Last Rate Last Admin   acetaminophen (TYLENOL) tablet 650 mg  650 mg Oral Q6H PRN Hongalgi, Maximino Greenland, MD       acetaminophen-codeine (TYLENOL #3)  300-30 MG per tablet 1 tablet  1 tablet Oral Q8H PRN Hongalgi, Maximino Greenland, MD       carbidopa-levodopa (SINEMET IR) 25-100 MG per tablet immediate release 1 tablet  1 tablet Oral QHS Floydene Flock, MD   1 tablet at 01/08/23 2107   cholecalciferol (VITAMIN D3) 25 MCG (1000 UNIT) tablet 1,000 Units  1,000 Units Oral Daily Elease Etienne, MD   1,000 Units at 01/09/23 0941   clopidogrel (PLAVIX) tablet 75 mg  75 mg Oral Daily Floydene Flock, MD   75 mg at 01/09/23 0941   cyanocobalamin (VITAMIN B12) tablet 1,000 mcg  1,000 mcg Oral Daily Marcellus Scott D, MD   1,000 mcg at 01/08/23 1627   enoxaparin (LOVENOX) injection 40 mg  40 mg Subcutaneous Daily Otelia Sergeant, RPH   40 mg at 01/09/23 3557   ferrous sulfate tablet 325 mg  325 mg Oral Q breakfast Amin, Ankit Chirag, MD   325 mg at 01/09/23 0803   fluticasone furoate-vilanterol (BREO ELLIPTA) 100-25 MCG/ACT 1 puff  1 puff Inhalation Daily Floydene Flock, MD   1 puff at 01/09/23 0757   And   umeclidinium bromide (INCRUSE ELLIPTA) 62.5 MCG/ACT 1 puff  1 puff Inhalation Daily Floydene Flock, MD   1 puff at 01/09/23 0758   guaiFENesin (ROBITUSSIN) 100 MG/5ML liquid 5 mL  5 mL Oral Q4H PRN Amin, Ankit Chirag, MD       hydrALAZINE (APRESOLINE) injection 10 mg  10 mg Intravenous Q4H PRN Amin, Ankit Chirag, MD       ipratropium-albuterol (DUONEB) 0.5-2.5 (3) MG/3ML nebulizer solution 3 mL  3 mL Nebulization Q4H PRN Amin, Ankit Chirag, MD       latanoprost (XALATAN) 0.005 % ophthalmic solution 1 drop  1 drop Both Eyes QHS Hongalgi, Anand D, MD   1 drop at 01/08/23 2107   magnesium hydroxide (MILK OF MAGNESIA) suspension 30 mL  30 mL Oral Daily PRN Elease Etienne, MD       metoprolol succinate (TOPROL-XL) 24 hr tablet 100 mg  100 mg Oral Daily Floydene Flock, MD   100 mg at 01/09/23 0941   ondansetron (ZOFRAN) tablet 4 mg  4 mg Oral Q6H PRN Floydene Flock, MD       Or   ondansetron [facility]) injection 4 mg  4 mg Intravenous Q6H PRN Floydene Flock, MD       pantoprazole (PROTONIX) EC tablet 40 mg  40 mg Oral BID AC Hongalgi, Maximino Greenland, MD       senna-docusate (Senokot-S) tablet 1 tablet  1 tablet Oral QHS PRN Amin, Ankit Chirag, MD       traZODone (DESYREL) tablet 50 mg  50 mg Oral QHS PRN Amin, Loura Halt, MD         Discharge Medications: Please see discharge summary for a list of discharge medications.  Relevant Imaging Results:  Relevant Lab Results:   Additional Information ss#: [id number]  Judeen Hammans Jerardo Costabile, LCSW

## 2023-01-09 NOTE — Care Management Important Message (Signed)
Important Message  Patient Details  Name: Sheila Long MRN: [id number] Date of Birth: 02/11/1924   Medicare Important Message Given:  N/A - LOS <3 / Initial given by admissions     Sheila Long 01/09/2023, 8:18 AM

## 2023-01-09 NOTE — Progress Notes (Signed)
PROGRESS NOTE   Sheila Long  HAF:[id number]    DOB: Apr 09, 1924    DOA: 01/06/2023  PCP: Enid Baas, MD   I have briefly reviewed patients previous medical records in [facility]  Chief Complaint  Patient presents with   Fall    Brief Narrative:  89 year old female, lives alone but reports that she has a caregiver who lives with her mostly 24/7, ambulates with a walker, PMH of Parkinson's disease, multiple falls, chronic Diastolic CHF, CAD, GERD, neuropathy, sarcoidosis, presented to the ED on 01/06/2023 following fall, right distal clavicular fracture and multiple right rib fractures.  Orthopedics consulted and recommend nonsurgical management for right distal clavicular fracture.  Awaiting PT and OT evaluation to determine discharge disposition.  Otherwise patient medically optimized for DC.   Assessment & Plan:  Principal Problem:   Fall Active Problems:   Parkinson disease   Hypertension   Stage 3a chronic kidney disease (CKD) (HCC)   Coronary artery disease   Pulmonary hypertension associated with sarcoidosis (HCC)   Generalized weakness   Multiple fractures   Multiple falls, right distal clavicular fracture and multiple right rib fractures: CK normal.  Vitamin D, 25-hydroxy: 31.03.  Orthopedics/Dr. Signa Kell input from 1/9 appreciated, he discussed both surgical and nonsurgical management with patient and patient wished to proceed with nonsurgical management.  RUE sling, may have finger, wrist, elbow range of movement to perform basic self-care activities if needed otherwise, recommend remaining nonweightbearing and sling and follow-up at [facility] orthopedics in approximately 2 weeks.  PT and OT recommended SNF.  Patient undecided and TOC is following regarding this.  Patient hesitant to take opioids but today states that she tolerates Tylenol with codeine, ordered that.  Parkinson's disease: Noted resting right hand tremors.  This is likely the cause of her  falls.  Continue Sinemet.  Stage 3a chronic kidney disease (CKD) (HCC) Creatinine is normal and at baseline.   Hypertension Stable, continue to monitor.  Continue home dose of Toprol-XL.  IV as needed   Coronary artery disease No active chest pain  Cont home regimen >Plavix and Toprol-XL   Generalized weakness Weakness workup as mentioned above.   Pulmonary hypertension associated with sarcoidosis (HCC) Appears stable from a resp standpoint.  As needed bronchodilators  Normocytic anemia: Stable.  Outpatient follow-up.  Hard of hearing: ?  Left her hearing aids at home  ?  GERD Initiated PPI.  There is no height or weight on file to calculate BMI.   DVT prophylaxis: enoxaparin (LOVENOX) injection 40 mg Start: 01/07/23 0300 SCDs Start: 01/06/23 1931     Code Status: Full Code:  Family Communication: None at bedside Disposition:  Status is: Inpatient Remains inpatient appropriate because: Medically optimized for DC to next level of care pending PT and OT evaluation.     Consultants:   Orthopedics  Procedures:     Antimicrobials:      Subjective:  Today she says that she overall does not feel well.  Not really specifying.  Difficult history due to extremely hard of hearing.  States that most mornings she wakes up feeling "bad" and possibly nauseous which improves after she drink some ginger ale.  Denies heart burn or chest pain.  Also reports right groin pain.  Opioid medication discussion as noted above.  Objective:   Vitals:   01/08/23 0756 01/08/23 1444 01/09/23 0006 01/09/23 0825  BP: (!) 124/45 (!) 124/34 (!) 147/61 (!) 153/76  Pulse: 68 76 79 74  Resp: 16 19 18  17  Temp: 98.3 F (36.8 C) (!) 97.4 F (36.3 C) 98.6 F (37 C) 99.9 F (37.7 C)  TempSrc:      SpO2: 100% 99% 94% 98%    General exam: Elderly female, moderately built and nourished sitting up in bed without distress. Respiratory system: Clear to auscultation without increased work of  breathing. Cardiovascular system: S1 & S2 heard, RRR. No JVD, murmurs, rubs, gallops or clicks. No pedal edema. Gastrointestinal system: Abdomen is nondistended, soft and nontender. No organomegaly or masses felt. Normal bowel sounds heard. Central nervous system: Alert and oriented. No focal neurological deficits.  Extremely hard of hearing. Extremities: Symmetric 5 x 5 power.  Right upper extremity in a sling.  Has normal movements of right fingers and neurovascular bundle intact.  No acute findings in the right groin.  Somewhat painful movements of right lower extremity at hip. Skin: No rashes, lesions or ulcers.  Small area of bruise over the right forehead/over the right lateral eyebrow, improving. Psychiatry: Judgement and insight appear normal. Mood & affect appropriate.     Data Reviewed:   I have personally reviewed following labs and imaging studies   CBC: Recent Labs  Lab 01/06/23 1549 01/06/23 2046 01/07/23 0438 01/08/23 0238  WBC 7.9 6.6 7.3 7.8  NEUTROABS 4.6  --  4.1  --   HGB 9.2* 8.8* 8.5* 8.2*  HCT 29.5* 28.0* 27.2* 25.7*  MCV 96.1 94.9 97.1 94.1  PLT 184 174 156 106    Basic Metabolic Panel: Recent Labs  Lab 01/06/23 1549 01/06/23 2046 01/07/23 0438 01/08/23 0238 01/09/23 0418  NA 140  --  140 138  --   K 3.8  --  3.7 3.8  --   CL 106  --  110 108  --   CO2 27  --  27 23  --   GLUCOSE 120*  --  132* 112*  --   BUN 24*  --  20 17  --   CREATININE 1.08* 0.95 0.96 0.93  --   CALCIUM 8.9  --  8.6* 8.2*  --   MG  --   --   --  2.1 2.0    Liver Function Tests: Recent Labs  Lab 01/07/23 0438  AST 14*  ALT <5  ALKPHOS 57  BILITOT 1.2  PROT 5.7*  ALBUMIN 3.2*    CBG: No results for input(s): "GLUCAP" in the last 168 hours.  Microbiology Studies:  No results found for this or any previous visit (from the past 240 hour(s)).  Radiology Studies:  No results found.  Scheduled Meds:    carbidopa-levodopa  1 tablet Oral QHS   cholecalciferol   1,000 Units Oral Daily   clopidogrel  75 mg Oral Daily   cyanocobalamin  1,000 mcg Oral Daily   enoxaparin (LOVENOX) injection  40 mg Subcutaneous Daily   ferrous sulfate  325 mg Oral Q breakfast   fluticasone furoate-vilanterol  1 puff Inhalation Daily   And   umeclidinium bromide  1 puff Inhalation Daily   latanoprost  1 drop Both Eyes QHS   metoprolol succinate  100 mg Oral Daily   pantoprazole  40 mg Oral BID AC    Continuous Infusions:     LOS: 3 days     Vernell Leep, MD,  FACP, FHM, SFHM, [facility], [place]     To contact the attending provider between 7A-7P or the covering provider during after hours 7P-7A, please  log into the web site [email or web address] and access using universal [place] password for that web site. If you do not have the password, please call the hospital operator.  01/09/2023, 10:15 AM

## 2023-01-09 NOTE — Plan of Care (Signed)
  Problem: Education: Goal: Knowledge of General Education information will improve Description: Including pain rating scale, medication(s)/side effects and non-pharmacologic comfort measures Outcome: Progressing   Problem: Health Behavior/Discharge Planning: Goal: Ability to manage health-related needs will improve Outcome: Progressing   Problem: Clinical Measurements: Goal: Diagnostic test results will improve Outcome: Progressing Goal: Respiratory complications will improve Outcome: Progressing Goal: Cardiovascular complication will be avoided Outcome: Progressing   Problem: Activity: Goal: Risk for activity intolerance will decrease Outcome: Progressing   Problem: Nutrition: Goal: Adequate nutrition will be maintained Outcome: Progressing   Problem: Elimination: Goal: Will not experience complications related to bowel motility Outcome: Progressing Goal: Will not experience complications related to urinary retention Outcome: Progressing   Problem: Pain Managment: Goal: General experience of comfort will improve Outcome: Progressing   

## 2023-01-09 NOTE — Progress Notes (Signed)
Physical Therapy Treatment Patient Details Name: Sheila Long MRN: [id number] DOB: 1924/08/10 Today's Date: 01/09/2023   History of Present Illness Pt is a 89 y.o. female with PMH that includes PD, HTN, CHF, coronary artery disease, GERD, neuropathy, and sarcoidosis, presenting with recurrent falls and diagnosed with R clavicle and rib fractures as well as generalized weakness.    PT Comments    Patient received in bed sleeping. Wakes easily to my voice. Patient is agreeable to PT session. She required mod A for bed mobility. Assistance for sling adjustment.  She is able to stand from slightly elevated bed with min guard. Patient ambulated 15 feet in room with hemi walker and min guard. She is fatigued with this distance. She will continue to benefit from skilled PT to improve endurance, strength and safety with mobility.      Recommendations for follow up therapy are one component of a multi-disciplinary discharge planning process, led by the attending physician.  Recommendations may be updated based on patient status, additional functional criteria and insurance authorization.  Follow Up Recommendations  Skilled nursing-short term rehab (<3 hours/day) Can patient physically be transported by private vehicle: Yes   Assistance Recommended at Discharge Frequent or constant Supervision/Assistance  Patient can return home with the following A lot of help with bathing/dressing/bathroom;A lot of help with walking and/or transfers;Assistance with feeding;Assistance with cooking/housework;Help with stairs or ramp for entrance;Direct supervision/assist for medications management   Equipment Recommendations  Other (comment) (hemiwalker)    Recommendations for Other Services       Precautions / Restrictions Precautions Precautions: Fall Required Braces or Orthoses: Sling Restrictions Weight Bearing Restrictions: Yes RUE Weight Bearing: Non weight bearing Other Position/Activity  Restrictions: Per ortho MD: May have finger, wrist, and elbow ROM to perform basic self care activities if needed. Otherwise, recommend remaining NWB in sling     Mobility  Bed Mobility Overal bed mobility: Needs Assistance Bed Mobility: Supine to Sit     Supine to sit: Mod assist     General bed mobility comments: mod assist to elevate trunk to seated position and to scoot to edge of bed due to NWB on right UE    Transfers Overall transfer level: Needs assistance Equipment used: Hemi-walker Transfers: Sit to/from Stand Sit to Stand: From elevated surface, Min guard                Ambulation/Gait Ambulation/Gait assistance: Min guard Gait Distance (Feet): 15 Feet Assistive device: Hemi-walker Gait Pattern/deviations: Step-to pattern, Shuffle, Decreased step length - right, Decreased step length - left Gait velocity: decreased     General Gait Details: Very slow, effortful cadence with occasional shuffling but no overt LOB, mod verbal and visual cues for sequencing with the [facility]   Stairs             Wheelchair Mobility    Modified Rankin (Stroke Patients Only)       Balance Overall balance assessment: History of Falls, Needs assistance Sitting-balance support: Feet supported Sitting balance-Leahy Scale: Good     Standing balance support: Single extremity supported, During functional activity, Reliant on assistive device for balance Standing balance-Leahy Scale: Fair                              Cognition Arousal/Alertness: Awake/alert Behavior During Therapy: WFL for tasks assessed/performed Overall Cognitive Status: Within Functional Limits for tasks assessed  Exercises      General Comments        Pertinent Vitals/Pain Pain Assessment Pain Assessment: Faces Faces Pain Scale: Hurts a little bit Pain Location: back Pain Descriptors / Indicators: Discomfort, Sore Pain  Intervention(s): Monitored during session    Home Living                          Prior Function            PT Goals (current goals can now be found in the care plan section) Acute Rehab PT Goals Patient Stated Goal: Improved balance and strength PT Goal Formulation: With patient Time For Goal Achievement: 01/21/23 Potential to Achieve Goals: Good Progress towards PT goals: Progressing toward goals    Frequency    7X/week      PT Plan Current plan remains appropriate    Co-evaluation              AM-PAC PT "6 Clicks" Mobility   Outcome Measure  Help needed turning from your back to your side while in a flat bed without using bedrails?: A Lot Help needed moving from lying on your back to sitting on the side of a flat bed without using bedrails?: A Lot Help needed moving to and from a bed to a chair (including a wheelchair)?: A Little Help needed standing up from a chair using your arms (e.g., wheelchair or bedside chair)?: A Little Help needed to walk in hospital room?: A Little Help needed climbing 3-5 steps with a railing? : A Lot 6 Click Score: 15    End of Session Equipment Utilized During Treatment: Gait belt Activity Tolerance: Patient tolerated treatment well Patient left: in chair;with call bell/phone within reach;with chair alarm set Nurse Communication: Mobility status PT Visit Diagnosis: History of falling (Z91.81);Unsteadiness on feet (R26.81);Muscle weakness (generalized) (M62.81);Difficulty in walking, not elsewhere classified (R26.2)     Time: 3662-9476 PT Time Calculation (min) (ACUTE ONLY): 18 min  Charges:  $Gait Training: 8-22 mins                     Genesis Paget, PT, GCS 01/09/23,9:54 AM

## 2023-01-10 DIAGNOSIS — S2241XD Multiple fractures of ribs, right side, subsequent encounter for fracture with routine healing: Secondary | ICD-10-CM | POA: Diagnosis not present

## 2023-01-10 DIAGNOSIS — H40053 Ocular hypertension, bilateral: Secondary | ICD-10-CM | POA: Diagnosis not present

## 2023-01-10 DIAGNOSIS — I130 Hypertensive heart and chronic kidney disease with heart failure and stage 1 through stage 4 chronic kidney disease, or unspecified chronic kidney disease: Secondary | ICD-10-CM | POA: Diagnosis not present

## 2023-01-10 DIAGNOSIS — R1312 Dysphagia, oropharyngeal phase: Secondary | ICD-10-CM | POA: Diagnosis not present

## 2023-01-10 DIAGNOSIS — I129 Hypertensive chronic kidney disease with stage 1 through stage 4 chronic kidney disease, or unspecified chronic kidney disease: Secondary | ICD-10-CM | POA: Diagnosis not present

## 2023-01-10 DIAGNOSIS — S42001S Fracture of unspecified part of right clavicle, sequela: Secondary | ICD-10-CM | POA: Diagnosis not present

## 2023-01-10 DIAGNOSIS — N1831 Chronic kidney disease, stage 3a: Secondary | ICD-10-CM | POA: Diagnosis not present

## 2023-01-10 DIAGNOSIS — M6281 Muscle weakness (generalized): Secondary | ICD-10-CM | POA: Diagnosis not present

## 2023-01-10 DIAGNOSIS — Z96651 Presence of right artificial knee joint: Secondary | ICD-10-CM | POA: Diagnosis not present

## 2023-01-10 DIAGNOSIS — G629 Polyneuropathy, unspecified: Secondary | ICD-10-CM | POA: Diagnosis not present

## 2023-01-10 DIAGNOSIS — E559 Vitamin D deficiency, unspecified: Secondary | ICD-10-CM | POA: Diagnosis not present

## 2023-01-10 DIAGNOSIS — Z7401 Bed confinement status: Secondary | ICD-10-CM | POA: Diagnosis not present

## 2023-01-10 DIAGNOSIS — D631 Anemia in chronic kidney disease: Secondary | ICD-10-CM | POA: Diagnosis not present

## 2023-01-10 DIAGNOSIS — I5032 Chronic diastolic (congestive) heart failure: Secondary | ICD-10-CM | POA: Diagnosis not present

## 2023-01-10 DIAGNOSIS — K5904 Chronic idiopathic constipation: Secondary | ICD-10-CM | POA: Diagnosis not present

## 2023-01-10 DIAGNOSIS — Z96643 Presence of artificial hip joint, bilateral: Secondary | ICD-10-CM | POA: Diagnosis not present

## 2023-01-10 DIAGNOSIS — E538 Deficiency of other specified B group vitamins: Secondary | ICD-10-CM | POA: Diagnosis not present

## 2023-01-10 DIAGNOSIS — S2241XA Multiple fractures of ribs, right side, initial encounter for closed fracture: Secondary | ICD-10-CM

## 2023-01-10 DIAGNOSIS — J3089 Other allergic rhinitis: Secondary | ICD-10-CM | POA: Diagnosis not present

## 2023-01-10 DIAGNOSIS — D508 Other iron deficiency anemias: Secondary | ICD-10-CM | POA: Diagnosis not present

## 2023-01-10 DIAGNOSIS — R296 Repeated falls: Secondary | ICD-10-CM | POA: Diagnosis not present

## 2023-01-10 DIAGNOSIS — N183 Chronic kidney disease, stage 3 unspecified: Secondary | ICD-10-CM | POA: Diagnosis not present

## 2023-01-10 DIAGNOSIS — F419 Anxiety disorder, unspecified: Secondary | ICD-10-CM | POA: Diagnosis not present

## 2023-01-10 DIAGNOSIS — K219 Gastro-esophageal reflux disease without esophagitis: Secondary | ICD-10-CM | POA: Diagnosis not present

## 2023-01-10 DIAGNOSIS — G4733 Obstructive sleep apnea (adult) (pediatric): Secondary | ICD-10-CM | POA: Diagnosis not present

## 2023-01-10 DIAGNOSIS — K5900 Constipation, unspecified: Secondary | ICD-10-CM | POA: Diagnosis not present

## 2023-01-10 DIAGNOSIS — I959 Hypotension, unspecified: Secondary | ICD-10-CM | POA: Diagnosis not present

## 2023-01-10 DIAGNOSIS — G20A1 Parkinson's disease without dyskinesia, without mention of fluctuations: Secondary | ICD-10-CM | POA: Diagnosis not present

## 2023-01-10 DIAGNOSIS — S2241XS Multiple fractures of ribs, right side, sequela: Secondary | ICD-10-CM | POA: Diagnosis not present

## 2023-01-10 DIAGNOSIS — M898X1 Other specified disorders of bone, shoulder: Secondary | ICD-10-CM | POA: Diagnosis not present

## 2023-01-10 DIAGNOSIS — G20C Parkinsonism, unspecified: Secondary | ICD-10-CM | POA: Diagnosis not present

## 2023-01-10 DIAGNOSIS — I2510 Atherosclerotic heart disease of native coronary artery without angina pectoris: Secondary | ICD-10-CM | POA: Diagnosis not present

## 2023-01-10 DIAGNOSIS — W19XXXD Unspecified fall, subsequent encounter: Secondary | ICD-10-CM | POA: Diagnosis not present

## 2023-01-10 DIAGNOSIS — S42034D Nondisplaced fracture of lateral end of right clavicle, subsequent encounter for fracture with routine healing: Secondary | ICD-10-CM | POA: Diagnosis not present

## 2023-01-10 MED ORDER — GUAIFENESIN 100 MG/5ML PO LIQD: 5.0000 mL | ORAL | Status: AC | PRN

## 2023-01-10 MED ORDER — ACETAMINOPHEN-CODEINE 300-30 MG PO TABS: 1.0000 | ORAL_TABLET | Freq: Two times a day (BID) | ORAL | 0 refills | Status: DC | PRN

## 2023-01-10 MED ORDER — PANTOPRAZOLE SODIUM 40 MG PO TBEC: 40.0000 mg | DELAYED_RELEASE_TABLET | Freq: Two times a day (BID) | ORAL | Status: DC

## 2023-01-10 MED ORDER — ACETAMINOPHEN 325 MG PO TABS: 650.0000 mg | ORAL_TABLET | Freq: Four times a day (QID) | ORAL | Status: DC | PRN

## 2023-01-10 MED ORDER — VITAMIN D3 25 MCG PO TABS: 1000.0000 [IU] | ORAL_TABLET | Freq: Every day | ORAL | Status: DC

## 2023-01-10 MED ORDER — POTASSIUM CHLORIDE CRYS ER 20 MEQ PO TBCR: 20.0000 meq | EXTENDED_RELEASE_TABLET | ORAL | Status: DC

## 2023-01-10 NOTE — Progress Notes (Signed)
Physical Therapy Treatment Patient Details Name: Sheila Long MRN: [id number] DOB: Apr 18, 1924 Today's Date: 01/10/2023   History of Present Illness Pt is a 89 y.o. female with PMH that includes PD, HTN, CHF, coronary artery disease, GERD, neuropathy, and sarcoidosis, presenting with recurrent falls and diagnosed with R clavicle and rib fractures as well as generalized weakness.    PT Comments    Pt was pleasant and motivated to participate during the session and put forth good effort throughout. Pt required physical assistance with bed mobility and transfers this session with cuing for proper sequencing.  Once in standing pt required on close CGA for stability and cuing for proper sequencing with the HW.  Pt was able to amb 15 feet with the [facility] but then only took several small steps with the [facility] before requesting to return to [facility] use secondary to feeling more confident and steady with the [place].  No adverse symptoms during the session other than shoulder pain that did not worsen with activity, SpO2 and HR WNL on room air.  Pt will benefit from PT services in a SNF setting upon discharge to safely address deficits listed in patient problem list for decreased caregiver assistance and eventual return to PLOF.     Recommendations for follow up therapy are one component of a multi-disciplinary discharge planning process, led by the attending physician.  Recommendations may be updated based on patient status, additional functional criteria and insurance authorization.  Follow Up Recommendations  Skilled nursing-short term rehab (<3 hours/day) Can patient physically be transported by private vehicle: No   Assistance Recommended at Discharge Frequent or constant Supervision/Assistance  Patient can return home with the following A lot of help with bathing/dressing/bathroom;A lot of help with walking and/or transfers;Assistance with feeding;Assistance with cooking/housework;Help with stairs or ramp for  entrance;Direct supervision/assist for medications management   Equipment Recommendations  Other (comment) (HW; TBD at next venue of care)    Recommendations for Other Services       Precautions / Restrictions Precautions Precautions: Fall Required Braces or Orthoses: Sling Restrictions Weight Bearing Restrictions: Yes RUE Weight Bearing: Non weight bearing Other Position/Activity Restrictions: Per ortho MD: May have finger, wrist, and elbow ROM to perform basic self care activities if needed. Otherwise, recommend remaining NWB in sling     Mobility  Bed Mobility Overal bed mobility: Needs Assistance Bed Mobility: Supine to Sit     Supine to sit: Mod assist     General bed mobility comments: Mod A for BLE and trunk control    Transfers Overall transfer level: Needs assistance Equipment used: Hemi-walker Transfers: Sit to/from Stand Sit to Stand: From elevated surface, Min assist           General transfer comment: Mod verbal cues for increased trunk flexion and min A to come to full upright standing    Ambulation/Gait Ambulation/Gait assistance: Min guard Gait Distance (Feet): 15 Feet x 1 with HW; 3 Feet x 1 with SPC Assistive device: Hemi-walker, Straight cane Gait Pattern/deviations: Step-to pattern, Shuffle, Decreased step length - right, Decreased step length - left Gait velocity: decreased     General Gait Details: Slow cadence with heavy lean on HW with slow, effortful steps; pt attempted several small steps with SPC but requested to return to use of HW due to increased feeling of stability and confidence; mod verbal and visual cues for proper sequencing with [employer]             Wheelchair Mobility  Modified Rankin (Stroke Patients Only)       Balance Overall balance assessment: History of Falls, Needs assistance Sitting-balance support: Feet supported Sitting balance-Leahy Scale: Good     Standing balance support: Single  extremity supported, During functional activity, Reliant on assistive device for balance Standing balance-Leahy Scale: Fair                              Cognition Arousal/Alertness: Awake/alert Behavior During Therapy: WFL for tasks assessed/performed Overall Cognitive Status: Within Functional Limits for tasks assessed                                          Exercises Total Joint Exercises Ankle Circles/Pumps: AROM, Strengthening, Both, 10 reps Quad Sets: Strengthening, Both, 10 reps Gluteal Sets: Strengthening, Both, 10 reps Long Arc Quad: Strengthening, Both, 10 reps, 15 reps Knee Flexion: Strengthening, Both, 10 reps, 15 reps Marching in Standing: Strengthening, Both, 10 reps, Seated Other Exercises Other Exercises: HEP review for BLE APs, QS, GS, and LAQs x 10 each every 1-2 hours daily    General Comments        Pertinent Vitals/Pain Pain Assessment Pain Assessment: 0-10 Pain Score: 5  Pain Location: R shoulder Pain Descriptors / Indicators: Discomfort, Sore Pain Intervention(s): Repositioned, Premedicated before session, Monitored during session    Home Living                          Prior Function            PT Goals (current goals can now be found in the care plan section) Progress towards PT goals: PT to reassess next treatment    Frequency    7X/week      PT Plan Current plan remains appropriate    Co-evaluation              AM-PAC PT "6 Clicks" Mobility   Outcome Measure  Help needed turning from your back to your side while in a flat bed without using bedrails?: A Lot Help needed moving from lying on your back to sitting on the side of a flat bed without using bedrails?: A Lot Help needed moving to and from a bed to a chair (including a wheelchair)?: A Lot Help needed standing up from a chair using your arms (e.g., wheelchair or bedside chair)?: A Little Help needed to walk in hospital room?: A  Little Help needed climbing 3-5 steps with a railing? : A Lot 6 Click Score: 14    End of Session Equipment Utilized During Treatment: Gait belt Activity Tolerance: Patient tolerated treatment well Patient left: in chair;with call bell/phone within reach;with chair alarm set Nurse Communication: Mobility status PT Visit Diagnosis: History of falling (Z91.81);Unsteadiness on feet (R26.81);Muscle weakness (generalized) (M62.81);Difficulty in walking, not elsewhere classified (R26.2)     Time: 1191-4782 PT Time Calculation (min) (ACUTE ONLY): 24 min  Charges:  $Gait Training: 8-22 mins $Therapeutic Exercise: 8-22 mins                     D. Scott Muhamad Serano PT, DPT 01/10/23, 11:06 AM

## 2023-01-10 NOTE — TOC PASRR Note (Signed)
30 Day PASRR Note   Patient Details  Name: Sheila Long Date of Birth: Apr 01, 1924   Transition of Care [facility]) CM/SW Contact:    Quin Hoop, LCSW Phone Number: 01/10/2023, 9:45 AM  To Whom It May Concern:  Please be advised that this patient will require a short-term nursing home stay - anticipated 30 days or less for rehabilitation and strengthening.   The plan is for return home.

## 2023-01-10 NOTE — Discharge Instructions (Signed)

## 2023-01-10 NOTE — Care Management Important Message (Signed)
Important Message  Patient Details  Name: Sheila Long MRN: [id number] Date of Birth: 08-04-1924   Medicare Important Message Given:  Yes     Juliann Pulse A Omelia Marquart 01/10/2023, 11:52 AM

## 2023-01-10 NOTE — Discharge Summary (Signed)
Physician Discharge Summary  Selinda Korzeniewski MVH:[id number] DOB: 1924/10/27  PCP: Enid Baas, MD  Admitted from: Home Discharged to: SNF  Admit date: 01/06/2023 Discharge date: 01/10/2023  Recommendations for Outpatient Follow-up:    Follow-up Information     Signa Kell, MD. Schedule an appointment as soon as possible for a visit in 2 week(s).   Specialty: Orthopedic Surgery Contact information: [address] [place] [address] [phone]         Enid Baas, MD. Schedule an appointment as soon as possible for a visit.   Specialty: Internal Medicine Why: To be seen after discharge from SNF. Contact information: [address] [place] [address] [phone]                  Home Health: None    Equipment/Devices: TBD at SNF    Discharge Condition: Improved and stable   Code Status: Full Code Diet recommendation:  Discharge Diet Orders (From admission, onward)     Start     Ordered   01/10/23 0000  Diet - low sodium heart healthy        01/10/23 1207             Discharge Diagnoses:  Principal Problem:   Fall Active Problems:   Parkinson disease   Hypertension   Stage 3a chronic kidney disease (CKD) (HCC)   Coronary artery disease   Pulmonary hypertension associated with sarcoidosis (HCC)   Generalized weakness   Multiple fractures   Brief Summary: 89 year old female, lives alone but reports that she has a caregiver who lives with her mostly 24/7, ambulates with a walker, PMH of Parkinson's disease, multiple falls, chronic Diastolic CHF, CAD, GERD, neuropathy, sarcoidosis, presented to the ED on 01/06/2023 following fall, right distal clavicular fracture and multiple right rib fractures.  Orthopedics consulted and recommend nonsurgical management for right distal clavicular fracture.  After careful consideration, patient finally opted for DC to SNF for short-term rehab.     Assessment & Plan:    Multiple falls, right distal clavicular fracture and multiple right rib fractures: CK normal.  Vitamin D, 25-hydroxy: 31.03.  Orthopedics/Dr. Signa Kell input from 1/9 appreciated, he discussed both surgical and nonsurgical management with patient and patient wished to proceed with nonsurgical management.  RUE sling, may have finger, wrist, elbow range of movement to perform basic self-care activities if needed otherwise, recommend remaining nonweightbearing and sling and follow-up at [facility] orthopedics in approximately 2 weeks.  PT and OT recommended SNF.  Patient was undecided regarding discharge disposition but after careful consideration has finally decided to go to SNF for short-term rehab.  As per her report and review of  PDMP on day of discharge, she has used and tolerated Tylenol 3.  This had been ordered yesterday but patient states that she did not have much pain and hence did not take any of that.  She did however take a dose this morning.  Parkinson's disease: Noted resting right hand tremors.  This is likely the cause of her falls.  Continue Sinemet.  Defer dosing adjustment to her primary care physician during outpatient follow-up.   Stage 3a chronic kidney disease (CKD) (HCC) Creatinine is normal and at baseline.   Hypertension Controlled on Toprol-XL alone.  Discontinued prior home dose of amlodipine.   Coronary artery disease No active chest pain  Cont home regimen >Plavix and Toprol-XL   Generalized weakness Weakness workup as mentioned above.   Pulmonary hypertension associated with sarcoidosis (HCC) Appears stable  from a resp standpoint.  As needed bronchodilators   Normocytic anemia: Stable.  Outpatient follow-up.   Hard of hearing: ?  Left her hearing aids at home   GERD Given her report of a.m. nausea/which improves with ginger ale, concern for ongoing reflux symptoms.  Increased PPI dose to twice daily which can be continued for a month or 2 and then consider  reducing to prior home dose of 1 times daily.    Consultants:   Orthopedics   Procedures:       Discharge Instructions  Discharge Instructions     (HEART FAILURE PATIENTS) Call MD:  Anytime you have any of the following symptoms: 1) 3 pound weight gain in 24 hours or 5 pounds in 1 week 2) shortness of breath, with or without a dry hacking cough 3) swelling in the hands, feet or stomach 4) if you have to sleep on extra pillows at night in order to breathe.   Complete by: As directed    Call MD for:  difficulty breathing, headache or visual disturbances   Complete by: As directed    Call MD for:  extreme fatigue   Complete by: As directed    Call MD for:  persistant dizziness or light-headedness   Complete by: As directed    Call MD for:  persistant nausea and vomiting   Complete by: As directed    Call MD for:  severe uncontrolled pain   Complete by: As directed    Call MD for:  temperature >100.4   Complete by: As directed    Diet - low sodium heart healthy   Complete by: As directed    Discharge instructions   Complete by: As directed    1. Plan for RUE in sling. 2. May have, finger, wrist, elbow RoM to perform basic self care activities if needed. Otherwise, recommend remaining NWB in sling.   Acetaminophen from all sources not to exceed 4 g/day.   Increase activity slowly   Complete by: As directed         Medication List     STOP taking these medications    amLODipine 5 MG tablet Commonly known as: NORVASC   haloperidol 5 MG tablet Commonly known as: HALDOL   torsemide 100 MG tablet Commonly known as: DEMADEX       TAKE these medications    acetaminophen 325 MG tablet Commonly known as: TYLENOL Take 2 tablets (650 mg total) by mouth every 6 (six) hours as needed for mild pain or moderate pain.   acetaminophen-codeine 300-30 MG tablet Commonly known as: TYLENOL #3 Take 1 tablet by mouth every 12 (twelve) hours as needed for severe pain. What  changed:  when to take this reasons to take this   carbidopa-levodopa 25-100 MG tablet Commonly known as: SINEMET IR Take 1 tablet by mouth at bedtime.   clopidogrel 75 MG tablet Commonly known as: PLAVIX Take 75 mg by mouth daily.   cyanocobalamin 1000 MCG tablet Take 1 tablet (1,000 mcg total) by mouth daily.   ferrous sulfate 325 (65 FE) MG EC tablet Take 1 tablet (325 mg total) by mouth 2 (two) times daily.   furosemide 20 MG tablet Commonly known as: LASIX Take 1 tablet (20 mg total) by mouth every other day.   guaiFENesin 100 MG/5ML liquid Commonly known as: ROBITUSSIN Take 5 mLs by mouth every 4 (four) hours as needed for up to 5 days for cough or to loosen phlegm.   ipratropium-albuterol 0.5-2.5 (  3) MG/3ML Soln Commonly known as: DUONEB Take 3 mLs by nebulization every 4 (four) hours as needed (shortness of breath.).   latanoprost 0.005 % ophthalmic solution Commonly known as: XALATAN Place 1 drop into both eyes at bedtime.   magnesium hydroxide 400 MG/5ML suspension Commonly known as: MILK OF MAGNESIA Take 30 mLs by mouth daily as needed for mild constipation.   metoprolol succinate 100 MG 24 hr tablet Commonly known as: TOPROL-XL Take 1 tablet (100 mg total) by mouth daily. Take with or immediately following a meal.   pantoprazole 40 MG tablet Commonly known as: PROTONIX Take 1 tablet (40 mg total) by mouth 2 (two) times daily before a meal. What changed: when to take this   potassium chloride SA 20 MEQ tablet Commonly known as: KLOR-CON M Take 1 tablet (20 mEq total) by mouth every other day. Take along with Lasix. What changed:  when to take this additional instructions   senna 8.6 MG tablet Commonly known as: Senokot Take 2 tablets (17.2 mg total) by mouth 2 (two) times daily. May crush, mix with water and give sublingually if needed.   Trelegy Ellipta 100-62.5-25 MCG/ACT Aepb Generic drug: Fluticasone-Umeclidin-Vilant Inhale 1 Inhalation into  the lungs daily.   vitamin D3 25 MCG tablet Commonly known as: CHOLECALCIFEROL Take 1 tablet (1,000 Units total) by mouth daily. Start taking on: January 11, 2023       Allergies  Allergen Reactions   Tramadol Other (See Comments)    CONFUSION   Sulfa Antibiotics     Patient does not recall   Zocor [Simvastatin] Other (See Comments)    INTOLERANCE-MYALGIAS       Procedures/Studies:  CT CHEST ABDOMEN PELVIS W CONTRAST  Result Date: 01/06/2023 CLINICAL DATA:  Fall while using rocker.  Right-sided pain. EXAM: CT CHEST, ABDOMEN, AND PELVIS WITH CONTRAST TECHNIQUE: Multidetector CT imaging of the chest, abdomen and pelvis was performed following the standard protocol during bolus administration of intravenous contrast. RADIATION DOSE REDUCTION: This exam was performed according to the departmental dose-optimization program which includes automated exposure control, adjustment of the mA and/or kV according to patient size and/or use of iterative reconstruction technique. CONTRAST:  155mL OMNIPAQUE IOHEXOL 300 MG/ML  SOLN COMPARISON:  Chest CT 06/05/2022. FINDINGS: CT CHEST FINDINGS Cardiovascular: Coronary, aortic arch, and branch vessel atherosclerotic vascular disease. Mild cardiomegaly. Mediastinum/Nodes: Small to moderate-sized hiatal hernia. Lungs/Pleura: Mild biapical pleuroparenchymal scarring. Mild dependent atelectasis in both lungs with some airway plugging in both lower lobes and bilateral airway thickening. Stable mild nodularity in the right middle lobe likely from old granulomatous disease, no change from 2018, benign. Musculoskeletal: Displaced right distal clavicular fracture extending towards but not within the [facility] joint. Mild comminution and surrounding hematoma. Substantial degenerative glenohumeral arthropathy bilaterally. Relatively nondisplaced fractures of the right second through sixth ribs, with the right second nondisplaced rib fractures being segmental. CT ABDOMEN PELVIS  FINDINGS Hepatobiliary: Unremarkable Pancreas: Unremarkable Spleen: Unremarkable Adrenals/Urinary Tract: Distal ureters in urinary bladder obscured by streak artifact from the hip implants and barium in the rectum. Bosniak category 2 cyst of the left kidney measures 2.7 cm in diameter on image 63 of series 2 and appears to have a thin faint inferior septation. No further imaging workup of this lesion is indicated. Adrenal glands unremarkable. Stomach/Bowel: Sigmoid colon diverticulosis with scattered barium along sigmoid colon diverticula. There is also some dense barium in the rectum. Vascular/Lymphatic: Atherosclerosis is present, including aortoiliac atherosclerotic disease. Reproductive: Uterus is thought to be absent. Region obscured  by streak artifact. Other: No supplemental non-categorized findings. Musculoskeletal: Bilateral total hip prostheses. Bony demineralization. Lumbar spondylosis and degenerative disc disease with suspected mild degrees of impingement at L2-3, L3-4, and L4-5. IMPRESSION: 1. Displaced right distal clavicular fracture extending towards but not within the [facility] joint. Surrounding hematoma. 2. Relatively nondisplaced fractures of the right second through sixth ribs, with the right second nondisplaced rib fractures being segmental. 3. No acute findings in the abdomen/pelvis. 4. Airway thickening with some airway plugging in both lower lobes, query bronchitis or reactive airways disease. 5. Small to moderate-sized hiatal hernia. 6. Bony demineralization. 7. Lumbar spondylosis and degenerative disc disease with suspected mild degrees of impingement at L2-3, L3-4, and L4-5. 8. Bosniak category 2 cyst of the left kidney measures 2.7 cm in diameter and appears to have a thin faint inferior septation. No further imaging workup of this lesion is indicated. 9. Aortic atherosclerosis. Aortic Atherosclerosis (ICD10-I70.0). Electronically Signed   By: Gaylyn Rong M.D.   On: 01/06/2023 18:18    DG Hip Unilat W or Wo Pelvis 2-3 Views Right  Result Date: 01/06/2023 CLINICAL DATA:  Trauma, fall EXAM: DG HIP (WITH OR WITHOUT PELVIS) 2-3V RIGHT COMPARISON:  None Available. FINDINGS: There is previous arthroplasty in both hips. No recent fracture is seen. There is barium in the rectum which is partly obscuring the pubic bones. There are smooth marginated calcifications medial to the lesser trochanter, possibly calcifications from previous injury or previous surgery. Scattered arterial calcifications are seen. There is high density, possibly barium in rectum and numerous diverticula in left colon. IMPRESSION: Previous arthroplasty in both hips. No recent fracture or dislocation is seen. Pubic bones are partly obscured by dense contrast in the rectum limiting evaluation. Electronically Signed   By: Ernie Avena M.D.   On: 01/06/2023 11:04   DG Ribs Unilateral W/Chest Right  Result Date: 01/06/2023 CLINICAL DATA:  Trauma, fall EXAM: RIGHT RIBS AND CHEST - 3+ VIEW COMPARISON:  None Available. FINDINGS: Transverse diameter of heart is increased. Calcifications are seen in thoracic aorta. There are no signs of pulmonary edema or focal pulmonary consolidation. There is no pleural effusion or pneumothorax. Minimal cortical irregularity is seen in the anterior ends of right ninth and tenth ribs. Osteopenia is seen in bony structures. IMPRESSION: There is minimal cortical irregularity in the anterior ends of right ninth and tenth ribs suggesting recent or old undisplaced fractures. There is no evidence of focal pulmonary contusion. There is no pleural effusion or pneumothorax. Cardiomegaly.  Aortic atherosclerosis. Electronically Signed   By: Ernie Avena M.D.   On: 01/06/2023 11:00   DG Shoulder Right  Result Date: 01/06/2023 CLINICAL DATA:  Trauma, fall EXAM: RIGHT SHOULDER - 2+ VIEW COMPARISON:  None Available. FINDINGS: There is deformity in the lateral end of right clavicle suggesting  possible recent fracture. In 1 of the images, there is cortical irregularity in the upper lateral aspect of right scapula which could not be seen in the rest of the images. There is subcortical cyst in the upper aspect of the glenoid. No fracture line is seen in right humerus. Degenerative changes are noted in glenohumeral joint with bony spurs and joint space narrowing. IMPRESSION: There is deformity in the lateral end of right clavicle suggesting recent fracture. There is deformity in the upper lateral aspect of scapula seen in a single view. Possibility of fracture in scapula is not excluded. If clinically warranted, follow-up CT may be considered. Degenerative changes are noted in right shoulder. Electronically Signed  By: Elmer Picker M.D.   On: 01/06/2023 10:58   CT HEAD WO CONTRAST (5MM)  Result Date: 01/06/2023 CLINICAL DATA:  Provided history: Fall.  Pain in right side. EXAM: CT HEAD WITHOUT CONTRAST CT CERVICAL SPINE WITHOUT CONTRAST TECHNIQUE: Multidetector CT imaging of the head and cervical spine was performed following the standard protocol without intravenous contrast. Multiplanar CT image reconstructions of the cervical spine were also generated. RADIATION DOSE REDUCTION: This exam was performed according to the departmental dose-optimization program which includes automated exposure control, adjustment of the mA and/or kV according to patient size and/or use of iterative reconstruction technique. COMPARISON:  Prior head CT examinations 08/31/2022 and earlier. FINDINGS: CT HEAD FINDINGS Brain: Generalized cerebral atrophy. Mild patchy and ill-defined hypoattenuation within the cerebral white matter, nonspecific but compatible with chronic small vessel disease. There is no acute intracranial hemorrhage. No demarcated cortical infarct. No extra-axial fluid collection. No evidence of an intracranial mass. No midline shift. Vascular: No hyperdense vessel.  Atherosclerotic calcifications.  Skull: No fracture or aggressive osseous lesion. Sinuses/Orbits: No mass or acute finding within the imaged orbits. Large fluid level, and background mucosal thickening, within the partially imaged left maxillary sinus. CT CERVICAL SPINE FINDINGS Alignment: Nonspecific reversal of the expected cervical lordosis. Slight grade 1 anterolisthesis at C3-C4. 2 mm grade 1 anterolisthesis at C4-C5. Slight grade 1 anterolisthesis at C7-T1. Skull base and vertebrae: The basion-dental and atlanto-dental intervals are maintained.No evidence of acute fracture to the cervical spine. Vertebral ankylosis at C5-C6 and C6-C7. Soft tissues and spinal canal: No prevertebral fluid or swelling. No visible canal hematoma. Disc levels: Cervical spondylosis with multilevel disc bulges/central disc protrusions, uncovertebral hypertrophy and facet arthrosis. Disc space narrowing is greatest at the non fused levels at C7-T1 (advanced at this level). No appreciable high-grade spinal canal stenosis. Multilevel bony neural foraminal narrowing. Upper chest: No consolidation within the imaged lung apices. No visible pneumothorax. Other: Partially imaged prominent soft tissue stranding within the right lower neck, right chest and right upper back. IMPRESSION: CT head: 1. No evidence of acute intracranial abnormality. 2. Mild chronic small vessel ischemic changes within the cerebral white matter. 3. Cerebral atrophy. 4. Left maxillary sinusitis. CT cervical spine: 1. No evidence of acute fracture to the cervical spine. 2. Partially imaged prominent soft tissue stranding within the right lower neck, right chest and right upper back. This likely reflects multiple hematomas. 3. Mild grade 1 anterolisthesis at C3-C4, C4-C5 and C7-T1. 4. Vertebral ankylosis at C5-C6 and C6-C7. 5. Cervical spondylosis. Electronically Signed   By: Kellie Simmering D.O.   On: 01/06/2023 10:16   CT Cervical Spine Wo Contrast  Result Date: 01/06/2023 CLINICAL DATA:  Provided  history: Fall.  Pain in right side. EXAM: CT HEAD WITHOUT CONTRAST CT CERVICAL SPINE WITHOUT CONTRAST TECHNIQUE: Multidetector CT imaging of the head and cervical spine was performed following the standard protocol without intravenous contrast. Multiplanar CT image reconstructions of the cervical spine were also generated. RADIATION DOSE REDUCTION: This exam was performed according to the departmental dose-optimization program which includes automated exposure control, adjustment of the mA and/or kV according to patient size and/or use of iterative reconstruction technique. COMPARISON:  Prior head CT examinations 08/31/2022 and earlier. FINDINGS: CT HEAD FINDINGS Brain: Generalized cerebral atrophy. Mild patchy and ill-defined hypoattenuation within the cerebral white matter, nonspecific but compatible with chronic small vessel disease. There is no acute intracranial hemorrhage. No demarcated cortical infarct. No extra-axial fluid collection. No evidence of an intracranial mass. No midline shift. Vascular:  No hyperdense vessel.  Atherosclerotic calcifications. Skull: No fracture or aggressive osseous lesion. Sinuses/Orbits: No mass or acute finding within the imaged orbits. Large fluid level, and background mucosal thickening, within the partially imaged left maxillary sinus. CT CERVICAL SPINE FINDINGS Alignment: Nonspecific reversal of the expected cervical lordosis. Slight grade 1 anterolisthesis at C3-C4. 2 mm grade 1 anterolisthesis at C4-C5. Slight grade 1 anterolisthesis at C7-T1. Skull base and vertebrae: The basion-dental and atlanto-dental intervals are maintained.No evidence of acute fracture to the cervical spine. Vertebral ankylosis at C5-C6 and C6-C7. Soft tissues and spinal canal: No prevertebral fluid or swelling. No visible canal hematoma. Disc levels: Cervical spondylosis with multilevel disc bulges/central disc protrusions, uncovertebral hypertrophy and facet arthrosis. Disc space narrowing is  greatest at the non fused levels at C7-T1 (advanced at this level). No appreciable high-grade spinal canal stenosis. Multilevel bony neural foraminal narrowing. Upper chest: No consolidation within the imaged lung apices. No visible pneumothorax. Other: Partially imaged prominent soft tissue stranding within the right lower neck, right chest and right upper back. IMPRESSION: CT head: 1. No evidence of acute intracranial abnormality. 2. Mild chronic small vessel ischemic changes within the cerebral white matter. 3. Cerebral atrophy. 4. Left maxillary sinusitis. CT cervical spine: 1. No evidence of acute fracture to the cervical spine. 2. Partially imaged prominent soft tissue stranding within the right lower neck, right chest and right upper back. This likely reflects multiple hematomas. 3. Mild grade 1 anterolisthesis at C3-C4, C4-C5 and C7-T1. 4. Vertebral ankylosis at C5-C6 and C6-C7. 5. Cervical spondylosis. Electronically Signed   By: Jackey Loge D.O.   On: 01/06/2023 10:16      Subjective: History has not had above.  No new complaints.  Reports that she did not take much pain meds in the last 24 hours due to no pain.  Chronic history of feeling bad, nauseous in the mornings as noted above.  Aware that she is discharging to SNF today.  Discharge Exam:  Vitals:   01/09/23 0006 01/09/23 0825 01/09/23 1542 01/10/23 0740  BP: (!) 147/61 (!) 153/76 (!) 141/59 (!) 145/55  Pulse: 79 74 72 72  Resp: 18 17 19 18   Temp: 98.6 F (37 C) 99.9 F (37.7 C) 97.9 F (36.6 C) 98.2 F (36.8 C)  TempSrc:      SpO2: 94% 98% 100% 96%    General exam: Elderly female, moderately built and nourished sitting up in bed without distress. Respiratory system: Clear to auscultation without increased work of breathing. Cardiovascular system: S1 & S2 heard, RRR. No JVD, murmurs, rubs, gallops or clicks. No pedal edema. Gastrointestinal system: Abdomen is nondistended, soft and nontender. No organomegaly or masses  felt. Normal bowel sounds heard. Central nervous system: Alert and oriented. No focal neurological deficits.  Extremely hard of hearing. Extremities: Symmetric 5 x 5 power.  Right upper extremity in a sling.  Has normal movements of right fingers and neurovascular bundle intact.  No acute findings in the right groin.  Somewhat painful movements of right lower extremity at hip. Skin: No rashes, lesions or ulcers.  Small area of bruise over the right forehead/over the right lateral eyebrow, improving. Psychiatry: Judgement and insight appear normal. Mood & affect appropriate.     The results of significant diagnostics from this hospitalization (including imaging, microbiology, ancillary and laboratory) are listed below for reference.     Microbiology: No results found for this or any previous visit (from the past 240 hour(s)).   Labs: CBC: Recent Labs  Lab 01/06/23 1549 01/06/23 2046 01/07/23 0438 01/08/23 0238  WBC 7.9 6.6 7.3 7.8  NEUTROABS 4.6  --  4.1  --   HGB 9.2* 8.8* 8.5* 8.2*  HCT 29.5* 28.0* 27.2* 25.7*  MCV 96.1 94.9 97.1 94.1  PLT 184 174 156 158    Basic Metabolic Panel: Recent Labs  Lab 01/06/23 1549 01/06/23 2046 01/07/23 0438 01/08/23 0238 01/09/23 0418  NA 140  --  140 138  --   K 3.8  --  3.7 3.8  --   CL 106  --  110 108  --   CO2 27  --  27 23  --   GLUCOSE 120*  --  132* 112*  --   BUN 24*  --  20 17  --   CREATININE 1.08* 0.95 0.96 0.93  --   CALCIUM 8.9  --  8.6* 8.2*  --   MG  --   --   --  2.1 2.0    Liver Function Tests: Recent Labs  Lab 01/07/23 0438  AST 14*  ALT <5  ALKPHOS 57  BILITOT 1.2  PROT 5.7*  ALBUMIN 3.2*    I discussed in detail with patient's sister via phone, updated care and answered all questions.  Time coordinating discharge: 25 minutes  SIGNED:  Marcellus ScottAnand Gohan Collister, MD,  FACP, [facility] CenterFHM, [facility] HospitalFHM, [facility] HospitalCMPC, [facility] HospitalCHCQM-PHYADV   [facility] Advisor [place]     To contact the attending provider between  7A-7P or the covering provider during after hours 7P-7A, please log into the web site [email or web address] and access using universal [place] password for that web site. If you do not have the password, please call the hospital operator.

## 2023-01-10 NOTE — Progress Notes (Signed)
Modified Barium Swallow Progress Note  Patient Details  Name: Sheila Long MRN: [id number] Date of Birth: 1924/05/02  Today's Date: 01/10/2023  Modified Barium Swallow completed.  Full report located under Chart Review in the Imaging Section.  Brief recommendations include the following:  Clinical Impression  Pt presents with reduced risk of aspiration when consuming thin liquids, puree, graham cracker with puree and whole barium tablet with thin liquids. Pt presents oral phase is mildly increased but likely at baseline for age. Her oral phase is complete. Pt's pharyngeal swallow is initiated with boluses resting in the vallecula. However pt is able to protect her airway with each swallow. At this time current diet appears appropriate. Skilled ST intervention is not indicated at this time.   Swallow Evaluation Recommendations       SLP Diet Recommendations: Regular solids;Thin liquid   Liquid Administration via: Cup;Straw   Medication Administration: Whole meds with liquid   Supervision: Patient able to self feed   Compensations: Minimize environmental distractions;Slow rate;Small sips/bites   Postural Changes: Seated upright at 90 degrees   Oral Care Recommendations: Oral care BID        Sheila Long 01/10/2023,6:35 AM

## 2023-01-10 NOTE — TOC Transition Note (Signed)
Transition of Care [facility]) - CM/SW Discharge Note   Patient Details  Name: Sheila Long MRN: [id number] Date of Birth: 04/16/1924  Transition of Care [facility]) CM/SW Contact:  Quin Hoop, LCSW Phone Number: 01/10/2023, 2:37 PM   Clinical Narrative:   Patient discharged to SNF, [facility].  Patient sister, Cleon Gustin and neighbor, Sharlett Iles, notified of transfer by [facility].      Final next level of care: Skilled Nursing Facility Barriers to Discharge: No Barriers Identified   Patient Goals and CMS Choice CMS Medicare.gov Compare Post Acute Care list provided to:: Patient Choice offered to / list presented to : Patient  Discharge Placement                Patient chooses bed at: [facility] Patient to be transferred to facility by: [facility] Name of family member notified: Algene, sister and Brunell, neighbor Patient and family notified of of transfer: 01/10/23  Discharge Plan and Services Additional resources added to the After Visit Summary for                                       Social Determinants of Health (SDOH) Interventions SDOH Screenings   Food Insecurity: No Food Insecurity (01/07/2023)  Housing: Low Risk  (01/07/2023)  Transportation Needs: No Transportation Needs (01/07/2023)  Utilities: Not At Risk (01/07/2023)  Alcohol Screen: Low Risk  (10/11/2021)  Depression (PHQ2-9): Low Risk  (10/11/2021)  Financial Resource Strain: Medium Risk (10/11/2021)  Physical Activity: Inactive (10/11/2021)  Social Connections: Moderately Integrated (10/11/2021)  Stress: No Stress Concern Present (10/11/2021)  Tobacco Use: Low Risk  (01/07/2023)     Readmission Risk Interventions     No data to display

## 2023-01-17 DIAGNOSIS — I129 Hypertensive chronic kidney disease with stage 1 through stage 4 chronic kidney disease, or unspecified chronic kidney disease: Secondary | ICD-10-CM | POA: Diagnosis not present

## 2023-01-17 DIAGNOSIS — G20C Parkinsonism, unspecified: Secondary | ICD-10-CM | POA: Diagnosis not present

## 2023-01-17 DIAGNOSIS — K5904 Chronic idiopathic constipation: Secondary | ICD-10-CM | POA: Diagnosis not present

## 2023-01-17 DIAGNOSIS — S2241XS Multiple fractures of ribs, right side, sequela: Secondary | ICD-10-CM | POA: Diagnosis not present

## 2023-01-17 DIAGNOSIS — I2510 Atherosclerotic heart disease of native coronary artery without angina pectoris: Secondary | ICD-10-CM | POA: Diagnosis not present

## 2023-01-17 DIAGNOSIS — S42001S Fracture of unspecified part of right clavicle, sequela: Secondary | ICD-10-CM | POA: Diagnosis not present

## 2023-01-17 DIAGNOSIS — J3089 Other allergic rhinitis: Secondary | ICD-10-CM | POA: Diagnosis not present

## 2023-01-17 DIAGNOSIS — R296 Repeated falls: Secondary | ICD-10-CM | POA: Diagnosis not present

## 2023-01-17 DIAGNOSIS — D508 Other iron deficiency anemias: Secondary | ICD-10-CM | POA: Diagnosis not present

## 2023-01-17 DIAGNOSIS — M6281 Muscle weakness (generalized): Secondary | ICD-10-CM | POA: Diagnosis not present

## 2023-01-17 DIAGNOSIS — K219 Gastro-esophageal reflux disease without esophagitis: Secondary | ICD-10-CM | POA: Diagnosis not present

## 2023-01-17 DIAGNOSIS — N183 Chronic kidney disease, stage 3 unspecified: Secondary | ICD-10-CM | POA: Diagnosis not present

## 2023-01-20 DIAGNOSIS — G20C Parkinsonism, unspecified: Secondary | ICD-10-CM | POA: Diagnosis not present

## 2023-01-20 DIAGNOSIS — K219 Gastro-esophageal reflux disease without esophagitis: Secondary | ICD-10-CM | POA: Diagnosis not present

## 2023-01-20 DIAGNOSIS — K5904 Chronic idiopathic constipation: Secondary | ICD-10-CM | POA: Diagnosis not present

## 2023-01-20 DIAGNOSIS — I129 Hypertensive chronic kidney disease with stage 1 through stage 4 chronic kidney disease, or unspecified chronic kidney disease: Secondary | ICD-10-CM | POA: Diagnosis not present

## 2023-01-20 DIAGNOSIS — S2241XS Multiple fractures of ribs, right side, sequela: Secondary | ICD-10-CM | POA: Diagnosis not present

## 2023-01-20 DIAGNOSIS — N183 Chronic kidney disease, stage 3 unspecified: Secondary | ICD-10-CM | POA: Diagnosis not present

## 2023-01-20 DIAGNOSIS — S42001S Fracture of unspecified part of right clavicle, sequela: Secondary | ICD-10-CM | POA: Diagnosis not present

## 2023-01-20 DIAGNOSIS — M6281 Muscle weakness (generalized): Secondary | ICD-10-CM | POA: Diagnosis not present

## 2023-01-20 DIAGNOSIS — D508 Other iron deficiency anemias: Secondary | ICD-10-CM | POA: Diagnosis not present

## 2023-01-20 DIAGNOSIS — J3089 Other allergic rhinitis: Secondary | ICD-10-CM | POA: Diagnosis not present

## 2023-01-20 DIAGNOSIS — R296 Repeated falls: Secondary | ICD-10-CM | POA: Diagnosis not present

## 2023-01-20 DIAGNOSIS — I2510 Atherosclerotic heart disease of native coronary artery without angina pectoris: Secondary | ICD-10-CM | POA: Diagnosis not present

## 2023-01-21 DIAGNOSIS — K5904 Chronic idiopathic constipation: Secondary | ICD-10-CM | POA: Diagnosis not present

## 2023-01-21 DIAGNOSIS — K219 Gastro-esophageal reflux disease without esophagitis: Secondary | ICD-10-CM | POA: Diagnosis not present

## 2023-01-21 DIAGNOSIS — G20C Parkinsonism, unspecified: Secondary | ICD-10-CM | POA: Diagnosis not present

## 2023-01-21 DIAGNOSIS — I129 Hypertensive chronic kidney disease with stage 1 through stage 4 chronic kidney disease, or unspecified chronic kidney disease: Secondary | ICD-10-CM | POA: Diagnosis not present

## 2023-01-21 DIAGNOSIS — J3089 Other allergic rhinitis: Secondary | ICD-10-CM | POA: Diagnosis not present

## 2023-01-21 DIAGNOSIS — I2510 Atherosclerotic heart disease of native coronary artery without angina pectoris: Secondary | ICD-10-CM | POA: Diagnosis not present

## 2023-01-21 DIAGNOSIS — R296 Repeated falls: Secondary | ICD-10-CM | POA: Diagnosis not present

## 2023-01-21 DIAGNOSIS — M6281 Muscle weakness (generalized): Secondary | ICD-10-CM | POA: Diagnosis not present

## 2023-01-21 DIAGNOSIS — S2241XS Multiple fractures of ribs, right side, sequela: Secondary | ICD-10-CM | POA: Diagnosis not present

## 2023-01-21 DIAGNOSIS — S42001S Fracture of unspecified part of right clavicle, sequela: Secondary | ICD-10-CM | POA: Diagnosis not present

## 2023-01-21 DIAGNOSIS — D508 Other iron deficiency anemias: Secondary | ICD-10-CM | POA: Diagnosis not present

## 2023-01-21 DIAGNOSIS — N183 Chronic kidney disease, stage 3 unspecified: Secondary | ICD-10-CM | POA: Diagnosis not present

## 2023-01-24 DIAGNOSIS — I129 Hypertensive chronic kidney disease with stage 1 through stage 4 chronic kidney disease, or unspecified chronic kidney disease: Secondary | ICD-10-CM | POA: Diagnosis not present

## 2023-01-24 DIAGNOSIS — I2510 Atherosclerotic heart disease of native coronary artery without angina pectoris: Secondary | ICD-10-CM | POA: Diagnosis not present

## 2023-01-24 DIAGNOSIS — K5904 Chronic idiopathic constipation: Secondary | ICD-10-CM | POA: Diagnosis not present

## 2023-01-24 DIAGNOSIS — N183 Chronic kidney disease, stage 3 unspecified: Secondary | ICD-10-CM | POA: Diagnosis not present

## 2023-01-24 DIAGNOSIS — K219 Gastro-esophageal reflux disease without esophagitis: Secondary | ICD-10-CM | POA: Diagnosis not present

## 2023-01-24 DIAGNOSIS — G20C Parkinsonism, unspecified: Secondary | ICD-10-CM | POA: Diagnosis not present

## 2023-01-24 DIAGNOSIS — M6281 Muscle weakness (generalized): Secondary | ICD-10-CM | POA: Diagnosis not present

## 2023-01-24 DIAGNOSIS — S42001S Fracture of unspecified part of right clavicle, sequela: Secondary | ICD-10-CM | POA: Diagnosis not present

## 2023-01-24 DIAGNOSIS — S2241XS Multiple fractures of ribs, right side, sequela: Secondary | ICD-10-CM | POA: Diagnosis not present

## 2023-01-24 DIAGNOSIS — R296 Repeated falls: Secondary | ICD-10-CM | POA: Diagnosis not present

## 2023-01-24 DIAGNOSIS — D508 Other iron deficiency anemias: Secondary | ICD-10-CM | POA: Diagnosis not present

## 2023-01-24 DIAGNOSIS — J3089 Other allergic rhinitis: Secondary | ICD-10-CM | POA: Diagnosis not present

## 2023-01-27 DIAGNOSIS — K219 Gastro-esophageal reflux disease without esophagitis: Secondary | ICD-10-CM | POA: Diagnosis not present

## 2023-01-27 DIAGNOSIS — I2510 Atherosclerotic heart disease of native coronary artery without angina pectoris: Secondary | ICD-10-CM | POA: Diagnosis not present

## 2023-01-27 DIAGNOSIS — S2241XS Multiple fractures of ribs, right side, sequela: Secondary | ICD-10-CM | POA: Diagnosis not present

## 2023-01-27 DIAGNOSIS — J3089 Other allergic rhinitis: Secondary | ICD-10-CM | POA: Diagnosis not present

## 2023-01-27 DIAGNOSIS — M6281 Muscle weakness (generalized): Secondary | ICD-10-CM | POA: Diagnosis not present

## 2023-01-27 DIAGNOSIS — S42001S Fracture of unspecified part of right clavicle, sequela: Secondary | ICD-10-CM | POA: Diagnosis not present

## 2023-01-27 DIAGNOSIS — N183 Chronic kidney disease, stage 3 unspecified: Secondary | ICD-10-CM | POA: Diagnosis not present

## 2023-01-27 DIAGNOSIS — I129 Hypertensive chronic kidney disease with stage 1 through stage 4 chronic kidney disease, or unspecified chronic kidney disease: Secondary | ICD-10-CM | POA: Diagnosis not present

## 2023-01-27 DIAGNOSIS — D508 Other iron deficiency anemias: Secondary | ICD-10-CM | POA: Diagnosis not present

## 2023-01-27 DIAGNOSIS — G20C Parkinsonism, unspecified: Secondary | ICD-10-CM | POA: Diagnosis not present

## 2023-01-27 DIAGNOSIS — R296 Repeated falls: Secondary | ICD-10-CM | POA: Diagnosis not present

## 2023-01-27 DIAGNOSIS — K5904 Chronic idiopathic constipation: Secondary | ICD-10-CM | POA: Diagnosis not present

## 2023-01-29 DIAGNOSIS — M898X1 Other specified disorders of bone, shoulder: Secondary | ICD-10-CM | POA: Diagnosis not present

## 2023-01-31 DIAGNOSIS — I129 Hypertensive chronic kidney disease with stage 1 through stage 4 chronic kidney disease, or unspecified chronic kidney disease: Secondary | ICD-10-CM | POA: Diagnosis not present

## 2023-01-31 DIAGNOSIS — S42001S Fracture of unspecified part of right clavicle, sequela: Secondary | ICD-10-CM | POA: Diagnosis not present

## 2023-01-31 DIAGNOSIS — I2510 Atherosclerotic heart disease of native coronary artery without angina pectoris: Secondary | ICD-10-CM | POA: Diagnosis not present

## 2023-01-31 DIAGNOSIS — M6281 Muscle weakness (generalized): Secondary | ICD-10-CM | POA: Diagnosis not present

## 2023-01-31 DIAGNOSIS — D508 Other iron deficiency anemias: Secondary | ICD-10-CM | POA: Diagnosis not present

## 2023-01-31 DIAGNOSIS — R296 Repeated falls: Secondary | ICD-10-CM | POA: Diagnosis not present

## 2023-01-31 DIAGNOSIS — S2241XS Multiple fractures of ribs, right side, sequela: Secondary | ICD-10-CM | POA: Diagnosis not present

## 2023-01-31 DIAGNOSIS — K5904 Chronic idiopathic constipation: Secondary | ICD-10-CM | POA: Diagnosis not present

## 2023-01-31 DIAGNOSIS — N183 Chronic kidney disease, stage 3 unspecified: Secondary | ICD-10-CM | POA: Diagnosis not present

## 2023-01-31 DIAGNOSIS — G20C Parkinsonism, unspecified: Secondary | ICD-10-CM | POA: Diagnosis not present

## 2023-01-31 DIAGNOSIS — J3089 Other allergic rhinitis: Secondary | ICD-10-CM | POA: Diagnosis not present

## 2023-01-31 DIAGNOSIS — K219 Gastro-esophageal reflux disease without esophagitis: Secondary | ICD-10-CM | POA: Diagnosis not present

## 2023-02-01 ENCOUNTER — Other Ambulatory Visit: Payer: Self-pay | Admitting: Internal Medicine

## 2023-02-01 DIAGNOSIS — N181 Chronic kidney disease, stage 1: Secondary | ICD-10-CM

## 2023-02-03 DIAGNOSIS — S42001S Fracture of unspecified part of right clavicle, sequela: Secondary | ICD-10-CM | POA: Diagnosis not present

## 2023-02-03 DIAGNOSIS — G20C Parkinsonism, unspecified: Secondary | ICD-10-CM | POA: Diagnosis not present

## 2023-02-03 DIAGNOSIS — K219 Gastro-esophageal reflux disease without esophagitis: Secondary | ICD-10-CM | POA: Diagnosis not present

## 2023-02-03 DIAGNOSIS — I129 Hypertensive chronic kidney disease with stage 1 through stage 4 chronic kidney disease, or unspecified chronic kidney disease: Secondary | ICD-10-CM | POA: Diagnosis not present

## 2023-02-03 DIAGNOSIS — D508 Other iron deficiency anemias: Secondary | ICD-10-CM | POA: Diagnosis not present

## 2023-02-03 DIAGNOSIS — D860 Sarcoidosis of lung: Secondary | ICD-10-CM | POA: Diagnosis not present

## 2023-02-03 DIAGNOSIS — I2729 Other secondary pulmonary hypertension: Secondary | ICD-10-CM | POA: Diagnosis not present

## 2023-02-03 DIAGNOSIS — I2510 Atherosclerotic heart disease of native coronary artery without angina pectoris: Secondary | ICD-10-CM | POA: Diagnosis not present

## 2023-02-03 DIAGNOSIS — R296 Repeated falls: Secondary | ICD-10-CM | POA: Diagnosis not present

## 2023-02-03 DIAGNOSIS — M6281 Muscle weakness (generalized): Secondary | ICD-10-CM | POA: Diagnosis not present

## 2023-02-03 DIAGNOSIS — S2241XS Multiple fractures of ribs, right side, sequela: Secondary | ICD-10-CM | POA: Diagnosis not present

## 2023-02-03 DIAGNOSIS — N183 Chronic kidney disease, stage 3 unspecified: Secondary | ICD-10-CM | POA: Diagnosis not present

## 2023-02-26 DIAGNOSIS — Z8781 Personal history of (healed) traumatic fracture: Secondary | ICD-10-CM | POA: Diagnosis not present

## 2023-02-26 DIAGNOSIS — M533 Sacrococcygeal disorders, not elsewhere classified: Secondary | ICD-10-CM | POA: Diagnosis not present

## 2023-02-26 DIAGNOSIS — F419 Anxiety disorder, unspecified: Secondary | ICD-10-CM | POA: Diagnosis not present

## 2023-02-26 DIAGNOSIS — I10 Essential (primary) hypertension: Secondary | ICD-10-CM | POA: Diagnosis not present

## 2023-02-26 DIAGNOSIS — S42001A Fracture of unspecified part of right clavicle, initial encounter for closed fracture: Secondary | ICD-10-CM | POA: Diagnosis not present

## 2023-02-26 DIAGNOSIS — N1832 Chronic kidney disease, stage 3b: Secondary | ICD-10-CM | POA: Diagnosis not present

## 2024-02-12 ENCOUNTER — Other Ambulatory Visit: Payer: Self-pay

## 2024-02-12 ENCOUNTER — Emergency Department: Payer: PPO

## 2024-02-12 ENCOUNTER — Emergency Department
Admission: EM | Admit: 2024-02-12 | Discharge: 2024-02-12 | Disposition: A | Discharge status: Home / Self Care | Payer: PPO | Source: Home / Self Care | Attending: Student in an Organized Health Care Education/Training Program | Admitting: Student in an Organized Health Care Education/Training Program

## 2024-02-12 ENCOUNTER — Encounter: Payer: Self-pay | Admitting: Emergency Medicine

## 2024-02-12 DIAGNOSIS — Z20822 Contact with and (suspected) exposure to covid-19: Secondary | ICD-10-CM | POA: Insufficient documentation

## 2024-02-12 DIAGNOSIS — J111 Influenza due to unidentified influenza virus with other respiratory manifestations: Secondary | ICD-10-CM

## 2024-02-12 DIAGNOSIS — J101 Influenza due to other identified influenza virus with other respiratory manifestations: Secondary | ICD-10-CM | POA: Insufficient documentation

## 2024-02-12 LAB — CBC WITH DIFFERENTIAL/PLATELET
Abs Immature Granulocytes: 0.01 10*3/uL (ref 0.00–0.07)
Basophils Absolute: 0 10*3/uL (ref 0.0–0.1)
Basophils Relative: 1 %
Eosinophils Absolute: 0.2 10*3/uL (ref 0.0–0.5)
Eosinophils Relative: 3 %
HCT: 38.4 % (ref 36.0–46.0)
Hemoglobin: 12.6 g/dL (ref 12.0–15.0)
Immature Granulocytes: 0 %
Lymphocytes Relative: 18 %
Lymphs Abs: 1.1 10*3/uL (ref 0.7–4.0)
MCH: 34.2 pg — ABNORMAL HIGH (ref 26.0–34.0)
MCHC: 32.8 g/dL (ref 30.0–36.0)
MCV: 104.3 fL — ABNORMAL HIGH (ref 80.0–100.0)
Monocytes Absolute: 0.9 10*3/uL (ref 0.1–1.0)
Monocytes Relative: 13 %
Neutro Abs: 4.2 10*3/uL (ref 1.7–7.7)
Neutrophils Relative %: 65 %
Platelets: 171 10*3/uL (ref 150–400)
RBC: 3.68 MIL/uL — ABNORMAL LOW (ref 3.87–5.11)
RDW: 14.4 % (ref 11.5–15.5)
WBC: 6.5 10*3/uL (ref 4.0–10.5)
nRBC: 0 % (ref 0.0–0.2)

## 2024-02-12 LAB — BASIC METABOLIC PANEL
Anion gap: 7 (ref 5–15)
BUN: 22 mg/dL (ref 8–23)
CO2: 25 mmol/L (ref 22–32)
Calcium: 8.9 mg/dL (ref 8.9–10.3)
Chloride: 108 mmol/L (ref 98–111)
Creatinine, Ser: 0.95 mg/dL (ref 0.44–1.00)
GFR, Estimated: 54 mL/min — ABNORMAL LOW (ref >60)
Glucose, Bld: 133 mg/dL — ABNORMAL HIGH (ref 70–99)
Potassium: 3.8 mmol/L (ref 3.5–5.1)
Sodium: 140 mmol/L (ref 135–145)

## 2024-02-12 LAB — TROPONIN I (HIGH SENSITIVITY): Troponin I (High Sensitivity): 9 ng/L (ref <18)

## 2024-02-12 LAB — RESP PANEL BY RT-PCR (RSV, FLU A&B, COVID)  RVPGX2
Influenza A by PCR: POSITIVE — AB
Influenza B by PCR: NEGATIVE
Resp Syncytial Virus by PCR: NEGATIVE
SARS Coronavirus 2 by RT PCR: NEGATIVE

## 2024-02-12 MED ORDER — OSELTAMIVIR PHOSPHATE 75 MG PO CAPS: 75.0000 mg | ORAL_CAPSULE | Freq: Two times a day (BID) | ORAL | 0 refills | Status: DC

## 2024-02-12 MED ORDER — ONDANSETRON 4 MG PO TBDP: 4.0000 mg | ORAL_TABLET | Freq: Three times a day (TID) | ORAL | 0 refills | Status: DC | PRN

## 2024-02-12 NOTE — ED Triage Notes (Signed)
Pt to ED via [facility] from home for cough and congestion. Pt states that she has had these symptoms for the past 2-3 weeks. Pt states that she is coughing up mucus now. Pt also states that for the past 2 weeks she has has tightness in her chest. Pt also mentioning a fall that occurred 1 year ago, states that she hurt her shoulder and it was never treated.

## 2024-02-12 NOTE — ED Provider Notes (Signed)
[facility] Provider Note    Event Date/Time   First MD Initiated Contact with Patient 02/12/24 1006     (approximate)   History   Cough and Nasal Congestion   HPI  Sheila Long is a 89 y.o. female who presents to the ER for evaluation of nasal congestion and postnasal drip associated cough.  No measured fevers.  Patient is asking if a fall that she had over a year ago could be resulting in the drainage that she is having currently.  She denies any numbness or tingling.  No nausea or vomiting.  Denies any recent antibiotics.     Physical Exam   Triage Vital Signs: ED Triage Vitals  Encounter Vitals Group     BP 02/12/24 0854 (!) 145/88     Systolic BP Percentile --      Diastolic BP Percentile --      Pulse Rate 02/12/24 0853 82     Resp 02/12/24 0853 18     Temp 02/12/24 0854 98.9 F (37.2 C)     Temp Source 02/12/24 0854 Oral     SpO2 02/12/24 0853 95 %     Weight 02/12/24 0853 185 lb (83.9 kg)     Height 02/12/24 0853 5\' 4"  (1.626 m)     Head Circumference --      Peak Flow --      Pain Score 02/12/24 0853 0     Pain Loc --      Pain Education --      Exclude from Growth Chart --     Most recent vital signs: Vitals:   02/12/24 0853 02/12/24 0854  BP:  (!) 145/88  Pulse: 82   Resp: 18   Temp:  98.9 F (37.2 C)  SpO2: 95%      Constitutional: Alert  Eyes: Conjunctivae are normal.  Head: Atraumatic. Nose: No congestion/rhinnorhea. Mouth/Throat: Mucous membranes are moist.   Neck: Painless ROM.  Cardiovascular:   Good peripheral circulation. Respiratory: Normal respiratory effort.  No retractions.  Gastrointestinal: Soft and nontender.  Musculoskeletal:  no deformity Neurologic:  MAE spontaneously. No gross focal neurologic deficits are appreciated.  Skin:  Skin is warm, dry and intact. No rash noted. Psychiatric: Mood and affect are normal. Speech and behavior are normal.    ED Results / Procedures / Treatments    Labs (all labs ordered are listed, but only abnormal results are displayed) Labs Reviewed  RESP PANEL BY RT-PCR (RSV, FLU A&B, COVID)  RVPGX2 - Abnormal; Notable for the following components:      Result Value   Influenza A by PCR POSITIVE (*)    All other components within normal limits  CBC WITH DIFFERENTIAL/PLATELET - Abnormal; Notable for the following components:   RBC 3.68 (*)    MCV 104.3 (*)    MCH 34.2 (*)    All other components within normal limits  BASIC METABOLIC PANEL - Abnormal; Notable for the following components:   Glucose, Bld 133 (*)    GFR, Estimated 54 (*)    All other components within normal limits  TROPONIN I (HIGH SENSITIVITY)     EKG     RADIOLOGY Please see ED Course for my review and interpretation.  I personally reviewed all radiographic images ordered to evaluate for the above acute complaints and reviewed radiology reports and findings.  These findings were personally discussed with the patient.  Please see medical record for radiology report.    PROCEDURES:  Critical Care performed:   Procedures   MEDICATIONS ORDERED IN ED: Medications - No data to display   IMPRESSION / MDM / ASSESSMENT AND PLAN / ED COURSE  I reviewed the triage vital signs and the nursing notes.                              Differential diagnosis includes, but is not limited to, URI, sinusitis, viral illness, COVID, flu, pneumonia  Patient presenting to the ER for evaluation of symptoms as described above.  Based on symptoms, risk factors and considered above differential, this presenting complaint could reflect a potentially life-threatening illness therefore the patient will be placed on continuous pulse oximetry and telemetry for monitoring.  Laboratory evaluation will be sent to evaluate for the above complaints.  Patient clinically appears well.  No leukocytosis.  Will check for viral pathogens.     Clinical Course as of 02/12/24 1113  Thu Feb 12, 2024   1015 Sodium: 140 [PR]  1020 Chest x-ray on my review and interpretation without evidence of consolidation. [PR]  1112 Patient is positive for influenza A.  She is otherwise well-appearing.  Her primary complaint is congestion and cough.  No respiratory distress.   I have considered admission given her age and risk factors but she clinically she does appear appropriate for outpatient follow-up.  Will be sent prescription for Tamiflu. [PR]    Clinical Course User Index [PR] [name], MD     FINAL CLINICAL IMPRESSION(S) / ED DIAGNOSES   Final diagnoses:  Influenza     Rx / DC Orders   ED Discharge Orders          Ordered    oseltamivir (TAMIFLU) 75 MG capsule  2 times daily        02/12/24 1112    ondansetron (ZOFRAN-ODT) 4 MG disintegrating tablet  Every 8 hours PRN        02/12/24 1112             Note:  This document was prepared using Dragon voice recognition software and may include unintentional dictation errors.    [name], MD 02/12/24 1113

## 2024-02-12 NOTE — ED Notes (Signed)
First Nurse Note: Pt to ED via [facility] from home for shortness of breath and cough x 3 weeks. Pt given 1 duoneb in route to the hospital.   99.1 temp CBG 146 161/85 BP 82 HR 95% on RA

## 2024-02-13 ENCOUNTER — Inpatient Hospital Stay
Admission: EM | Admit: 2024-02-13 | Discharge: 2024-02-18 | DRG: 193 | Disposition: A | Discharge status: Skilled Nursing Facility | Payer: PPO | Attending: Internal Medicine | Admitting: Internal Medicine

## 2024-02-13 ENCOUNTER — Other Ambulatory Visit: Payer: Self-pay

## 2024-02-13 DIAGNOSIS — Z515 Encounter for palliative care: Secondary | ICD-10-CM | POA: Diagnosis not present

## 2024-02-13 DIAGNOSIS — J101 Influenza due to other identified influenza virus with other respiratory manifestations: Secondary | ICD-10-CM | POA: Diagnosis present

## 2024-02-13 DIAGNOSIS — I10 Essential (primary) hypertension: Secondary | ICD-10-CM | POA: Diagnosis present

## 2024-02-13 DIAGNOSIS — J9621 Acute and chronic respiratory failure with hypoxia: Secondary | ICD-10-CM | POA: Diagnosis present

## 2024-02-13 DIAGNOSIS — I739 Peripheral vascular disease, unspecified: Secondary | ICD-10-CM | POA: Diagnosis present

## 2024-02-13 DIAGNOSIS — Z882 Allergy status to sulfonamides status: Secondary | ICD-10-CM

## 2024-02-13 DIAGNOSIS — Z6831 Body mass index (BMI) 31.0-31.9, adult: Secondary | ICD-10-CM | POA: Diagnosis not present

## 2024-02-13 DIAGNOSIS — I5032 Chronic diastolic (congestive) heart failure: Secondary | ICD-10-CM | POA: Diagnosis present

## 2024-02-13 DIAGNOSIS — Z7902 Long term (current) use of antithrombotics/antiplatelets: Secondary | ICD-10-CM

## 2024-02-13 DIAGNOSIS — W19XXXA Unspecified fall, initial encounter: Secondary | ICD-10-CM | POA: Diagnosis present

## 2024-02-13 DIAGNOSIS — E66811 Obesity, class 1: Secondary | ICD-10-CM | POA: Diagnosis present

## 2024-02-13 DIAGNOSIS — Z79899 Other long term (current) drug therapy: Secondary | ICD-10-CM

## 2024-02-13 DIAGNOSIS — D869 Sarcoidosis, unspecified: Secondary | ICD-10-CM | POA: Diagnosis present

## 2024-02-13 DIAGNOSIS — Z96651 Presence of right artificial knee joint: Secondary | ICD-10-CM | POA: Diagnosis present

## 2024-02-13 DIAGNOSIS — Z8249 Family history of ischemic heart disease and other diseases of the circulatory system: Secondary | ICD-10-CM | POA: Diagnosis not present

## 2024-02-13 DIAGNOSIS — G20A1 Parkinson's disease without dyskinesia, without mention of fluctuations: Secondary | ICD-10-CM | POA: Diagnosis present

## 2024-02-13 DIAGNOSIS — N1831 Chronic kidney disease, stage 3a: Secondary | ICD-10-CM | POA: Diagnosis present

## 2024-02-13 DIAGNOSIS — Z8616 Personal history of COVID-19: Secondary | ICD-10-CM

## 2024-02-13 DIAGNOSIS — R300 Dysuria: Secondary | ICD-10-CM | POA: Diagnosis present

## 2024-02-13 DIAGNOSIS — H9190 Unspecified hearing loss, unspecified ear: Secondary | ICD-10-CM | POA: Diagnosis present

## 2024-02-13 DIAGNOSIS — G4733 Obstructive sleep apnea (adult) (pediatric): Secondary | ICD-10-CM | POA: Diagnosis present

## 2024-02-13 DIAGNOSIS — I2510 Atherosclerotic heart disease of native coronary artery without angina pectoris: Secondary | ICD-10-CM | POA: Diagnosis present

## 2024-02-13 DIAGNOSIS — J9601 Acute respiratory failure with hypoxia: Secondary | ICD-10-CM | POA: Diagnosis present

## 2024-02-13 DIAGNOSIS — R7303 Prediabetes: Secondary | ICD-10-CM | POA: Diagnosis present

## 2024-02-13 DIAGNOSIS — Z1152 Encounter for screening for COVID-19: Secondary | ICD-10-CM | POA: Diagnosis not present

## 2024-02-13 DIAGNOSIS — K2100 Gastro-esophageal reflux disease with esophagitis, without bleeding: Secondary | ICD-10-CM | POA: Diagnosis present

## 2024-02-13 DIAGNOSIS — Z90710 Acquired absence of both cervix and uterus: Secondary | ICD-10-CM

## 2024-02-13 DIAGNOSIS — R296 Repeated falls: Secondary | ICD-10-CM | POA: Diagnosis present

## 2024-02-13 DIAGNOSIS — Z7189 Other specified counseling: Secondary | ICD-10-CM | POA: Diagnosis not present

## 2024-02-13 DIAGNOSIS — I272 Pulmonary hypertension, unspecified: Secondary | ICD-10-CM | POA: Diagnosis present

## 2024-02-13 DIAGNOSIS — G629 Polyneuropathy, unspecified: Secondary | ICD-10-CM | POA: Diagnosis present

## 2024-02-13 DIAGNOSIS — I110 Hypertensive heart disease with heart failure: Secondary | ICD-10-CM | POA: Diagnosis present

## 2024-02-13 DIAGNOSIS — Z885 Allergy status to narcotic agent status: Secondary | ICD-10-CM

## 2024-02-13 DIAGNOSIS — I493 Ventricular premature depolarization: Secondary | ICD-10-CM | POA: Diagnosis present

## 2024-02-13 DIAGNOSIS — Z955 Presence of coronary angioplasty implant and graft: Secondary | ICD-10-CM | POA: Diagnosis not present

## 2024-02-13 DIAGNOSIS — Z96643 Presence of artificial hip joint, bilateral: Secondary | ICD-10-CM | POA: Diagnosis present

## 2024-02-13 DIAGNOSIS — Z888 Allergy status to other drugs, medicaments and biological substances status: Secondary | ICD-10-CM

## 2024-02-13 DIAGNOSIS — K5900 Constipation, unspecified: Secondary | ICD-10-CM | POA: Diagnosis present

## 2024-02-13 LAB — CBC WITH DIFFERENTIAL/PLATELET
Abs Immature Granulocytes: 0.04 10*3/uL (ref 0.00–0.07)
Basophils Absolute: 0 10*3/uL (ref 0.0–0.1)
Basophils Relative: 0 %
Eosinophils Absolute: 0.3 10*3/uL (ref 0.0–0.5)
Eosinophils Relative: 2 %
HCT: 38.3 % (ref 36.0–46.0)
Hemoglobin: 12.6 g/dL (ref 12.0–15.0)
Immature Granulocytes: 0 %
Lymphocytes Relative: 17 %
Lymphs Abs: 1.8 10*3/uL (ref 0.7–4.0)
MCH: 34.5 pg — ABNORMAL HIGH (ref 26.0–34.0)
MCHC: 32.9 g/dL (ref 30.0–36.0)
MCV: 104.9 fL — ABNORMAL HIGH (ref 80.0–100.0)
Monocytes Absolute: 0.9 10*3/uL (ref 0.1–1.0)
Monocytes Relative: 8 %
Neutro Abs: 7.6 10*3/uL (ref 1.7–7.7)
Neutrophils Relative %: 73 %
Platelets: 165 10*3/uL (ref 150–400)
RBC: 3.65 MIL/uL — ABNORMAL LOW (ref 3.87–5.11)
RDW: 14.3 % (ref 11.5–15.5)
WBC: 10.5 10*3/uL (ref 4.0–10.5)
nRBC: 0 % (ref 0.0–0.2)

## 2024-02-13 LAB — BASIC METABOLIC PANEL
Anion gap: 11 (ref 5–15)
BUN: 20 mg/dL (ref 8–23)
CO2: 24 mmol/L (ref 22–32)
Calcium: 8.5 mg/dL — ABNORMAL LOW (ref 8.9–10.3)
Chloride: 104 mmol/L (ref 98–111)
Creatinine, Ser: 0.92 mg/dL (ref 0.44–1.00)
GFR, Estimated: 57 mL/min — ABNORMAL LOW (ref >60)
Glucose, Bld: 163 mg/dL — ABNORMAL HIGH (ref 70–99)
Potassium: 3.7 mmol/L (ref 3.5–5.1)
Sodium: 139 mmol/L (ref 135–145)

## 2024-02-13 LAB — HEMOGLOBIN A1C
Hgb A1c MFr Bld: 5.8 % — ABNORMAL HIGH (ref 4.8–5.6)
Mean Plasma Glucose: 119.76 mg/dL

## 2024-02-13 LAB — CBG MONITORING, ED
Glucose-Capillary: 241 mg/dL — ABNORMAL HIGH (ref 70–99)
Glucose-Capillary: 243 mg/dL — ABNORMAL HIGH (ref 70–99)

## 2024-02-13 LAB — BRAIN NATRIURETIC PEPTIDE: B Natriuretic Peptide: 239.9 pg/mL — ABNORMAL HIGH (ref 0.0–100.0)

## 2024-02-13 MED ORDER — IPRATROPIUM-ALBUTEROL 0.5-2.5 (3) MG/3ML IN SOLN
6.0000 mL | Freq: Once | RESPIRATORY_TRACT | Status: AC
  Administered 2024-02-13: 6 mL via RESPIRATORY_TRACT
  Filled 2024-02-13: qty 6

## 2024-02-13 MED ORDER — ACETAMINOPHEN 325 MG PO TABS
650.0000 mg | ORAL_TABLET | Freq: Four times a day (QID) | ORAL | Status: DC | PRN
  Filled 2024-02-13: qty 2

## 2024-02-13 MED ORDER — ONDANSETRON HCL 4 MG/2ML IJ SOLN: 4.0000 mg | Freq: Three times a day (TID) | INTRAMUSCULAR | Status: AC | PRN

## 2024-02-13 MED ORDER — ESCITALOPRAM OXALATE 10 MG PO TABS
5.0000 mg | ORAL_TABLET | Freq: Every day | ORAL | Status: DC
  Administered 2024-02-13 – 2024-02-18 (×6): 5 mg via ORAL
  Filled 2024-02-13 (×6): qty 1

## 2024-02-13 MED ORDER — OSELTAMIVIR PHOSPHATE 30 MG PO CAPS
30.0000 mg | ORAL_CAPSULE | Freq: Two times a day (BID) | ORAL | Status: AC
  Administered 2024-02-13 – 2024-02-17 (×9): 30 mg via ORAL
  Filled 2024-02-13 (×10): qty 1

## 2024-02-13 MED ORDER — ENOXAPARIN SODIUM 40 MG/0.4ML IJ SOSY
40.0000 mg | PREFILLED_SYRINGE | INTRAMUSCULAR | Status: DC
  Administered 2024-02-13 – 2024-02-17 (×5): 40 mg via SUBCUTANEOUS
  Filled 2024-02-13 (×5): qty 0.4

## 2024-02-13 MED ORDER — FUROSEMIDE 10 MG/ML IJ SOLN
20.0000 mg | Freq: Once | INTRAMUSCULAR | Status: AC
  Administered 2024-02-13: 20 mg via INTRAVENOUS
  Filled 2024-02-13: qty 4

## 2024-02-13 MED ORDER — SODIUM CHLORIDE 0.9 % IV SOLN: INTRAVENOUS | Status: DC

## 2024-02-13 MED ORDER — IPRATROPIUM-ALBUTEROL 0.5-2.5 (3) MG/3ML IN SOLN
3.0000 mL | Freq: Four times a day (QID) | RESPIRATORY_TRACT | Status: DC
Start: 2024-02-13 — End: 2024-02-14
  Administered 2024-02-13 – 2024-02-14 (×5): 3 mL via RESPIRATORY_TRACT
  Filled 2024-02-13 (×7): qty 3

## 2024-02-13 MED ORDER — ALBUTEROL SULFATE (2.5 MG/3ML) 0.083% IN NEBU: 2.5000 mg | INHALATION_SOLUTION | RESPIRATORY_TRACT | Status: AC | PRN

## 2024-02-13 MED ORDER — PANTOPRAZOLE SODIUM 40 MG PO TBEC
40.0000 mg | DELAYED_RELEASE_TABLET | Freq: Two times a day (BID) | ORAL | Status: DC
  Administered 2024-02-14 – 2024-02-18 (×9): 40 mg via ORAL
  Filled 2024-02-13 (×9): qty 1

## 2024-02-13 MED ORDER — CLOPIDOGREL BISULFATE 75 MG PO TABS
75.0000 mg | ORAL_TABLET | Freq: Every day | ORAL | Status: DC
  Administered 2024-02-13 – 2024-02-18 (×6): 75 mg via ORAL
  Filled 2024-02-13 (×6): qty 1

## 2024-02-13 MED ORDER — LATANOPROST 0.005 % OP SOLN
1.0000 [drp] | Freq: Every day | OPHTHALMIC | Status: DC
  Administered 2024-02-13 – 2024-02-17 (×5): 1 [drp] via OPHTHALMIC
  Filled 2024-02-13: qty 2.5

## 2024-02-13 MED ORDER — METHYLPREDNISOLONE SODIUM SUCC 125 MG IJ SOLR
125.0000 mg | Freq: Once | INTRAMUSCULAR | Status: AC
  Administered 2024-02-13: 125 mg via INTRAVENOUS
  Filled 2024-02-13: qty 2

## 2024-02-13 MED ORDER — CARBIDOPA-LEVODOPA 25-100 MG PO TABS
1.0000 | ORAL_TABLET | Freq: Every day | ORAL | Status: DC
  Administered 2024-02-13 – 2024-02-17 (×5): 1 via ORAL
  Filled 2024-02-13 (×5): qty 1

## 2024-02-13 MED ORDER — HYDROCODONE-ACETAMINOPHEN 5-325 MG PO TABS
1.0000 | ORAL_TABLET | ORAL | Status: DC | PRN
  Filled 2024-02-13: qty 2

## 2024-02-13 MED ORDER — FUROSEMIDE 20 MG PO TABS
20.0000 mg | ORAL_TABLET | ORAL | Status: DC
  Administered 2024-02-14 – 2024-02-16 (×2): 20 mg via ORAL
  Filled 2024-02-13 (×3): qty 1

## 2024-02-13 MED ORDER — INSULIN ASPART 100 UNIT/ML IJ SOLN
0.0000 [IU] | Freq: Three times a day (TID) | INTRAMUSCULAR | Status: DC
  Administered 2024-02-13 (×2): 3 [IU] via SUBCUTANEOUS
  Filled 2024-02-13 (×2): qty 1

## 2024-02-13 MED ORDER — OSELTAMIVIR PHOSPHATE 75 MG PO CAPS
75.0000 mg | ORAL_CAPSULE | Freq: Once | ORAL | Status: AC
  Administered 2024-02-13: 75 mg via ORAL
  Filled 2024-02-13: qty 1

## 2024-02-13 MED ORDER — PREDNISONE 20 MG PO TABS
40.0000 mg | ORAL_TABLET | Freq: Every day | ORAL | Status: DC
  Administered 2024-02-13 – 2024-02-16 (×4): 40 mg via ORAL
  Filled 2024-02-13 (×4): qty 2

## 2024-02-13 MED ORDER — GUAIFENESIN-DM 100-10 MG/5ML PO SYRP
5.0000 mL | ORAL_SOLUTION | ORAL | Status: DC | PRN
  Administered 2024-02-14 – 2024-02-15 (×3): 5 mL via ORAL
  Filled 2024-02-13 (×4): qty 10

## 2024-02-13 MED ORDER — ACETAMINOPHEN 650 MG RE SUPP: 650.0000 mg | Freq: Four times a day (QID) | RECTAL | Status: DC | PRN

## 2024-02-13 NOTE — ED Triage Notes (Signed)
Pt to ED from home with SOB after flu positive diagnosis. Patient is on 4 L [place] on arrival but none at baseline. Patient is A&O x4

## 2024-02-13 NOTE — Assessment & Plan Note (Signed)
BP stable Titrate home regimen

## 2024-02-13 NOTE — H&P (Signed)
 History and Physical    [name] JXB:[id number] DOB: April 26, 1924 DOA: 02/13/2024 DOS: the patient was seen and examined on 02/13/2024 PCP: Enid Baas, MD  Patient coming from: Home  Chief Complaint:  Chief Complaint  Patient presents with   [name]   HPI: Sheila Long is a 89 y.o. female with medical history significant of Parkinson's disease, multiple falls, chronic Diastolic CHF, CAD, GERD, neuropathy, sarcoidosis presenting w/ acute on chronic resp failure w/ hypoxia, influenza A. Pt noted to have been diagnosed yesterday with influenza A.  Came from home SOB with new O2 requirement. + cough and wheezing.  No reported abdominal pain, nausea or vomiting.  No reported focal hemiparesis or confusion.  Noted remote admission in January 2024 for multiple falls with clavicle and rib fractures conservatively managed.  No reports of fall at home. Presented to ER afebrile, hemodynamically stable. Requiring 4L [place] to keep O2 sats >98%.  White count 7.5, hemoglobin 12.6, platelets 165, BNP 240, creatinine 0.92, glucose 160s.  Chest x-ray grossly stable apart from mild cardiomegaly. EKG NSR w/ PVCs.  Review of Systems: As mentioned in the history of present illness. All other systems reviewed and are negative. Past Medical History:  Diagnosis Date   Allergic rhinitis due to pollen    Anxiety    Aortic atherosclerosis (HCC)    CHF (congestive heart failure) (HCC)    Coronary artery disease    Stent in 2000's (DC)   Dyspnea    GERD (gastroesophageal reflux disease)    Hard of hearing    Hypertension    Neuropathy    Obstructive sleep apnea    Osteoarthritis of both knees    Peripheral vascular disease (HCC)    Sarcoidosis    Thyroid nodule    Past Surgical History:  Procedure Laterality Date   ABDOMINAL HYSTERECTOMY     BIOPSY THYROID  2014   Dr Andee Poles   BREAST EXCISIONAL BIOPSY Left 1980s   surgical bx neg   BREAST EXCISIONAL BIOPSY Left  1980s   benign   CARDIOVASCULAR STRESS TEST  05/12/2012   with dobutamine---negative. Normal LV function   CORONARY ANGIOPLASTY  2006   ESOPHAGOGASTRODUODENOSCOPY (EGD) WITH PROPOFOL N/A 04/03/2020   Procedure: ESOPHAGOGASTRODUODENOSCOPY (EGD) WITH PROPOFOL;  Surgeon: [name], MD;  Location: [facility] ENDOSCOPY;  Service: Gastroenterology;  Laterality: N/A;   JOINT REPLACEMENT Left 2000   hip   JOINT REPLACEMENT Right 2007   hip   KNEE ARTHROPLASTY Right 06/23/2017   Procedure: COMPUTER ASSISTED TOTAL KNEE ARTHROPLASTY;  Surgeon: Donato Heinz, MD;  Location: [facility] ORS;  Service: Orthopedics;  Laterality: Right;   Social History:  reports that she has never smoked. She has never used smokeless tobacco. She reports that she does not drink alcohol and does not use drugs.  Allergies  Allergen Reactions   Tramadol Other (See Comments)    CONFUSION   Sulfa Antibiotics     Patient does not recall   Zocor [Simvastatin] Other (See Comments)    INTOLERANCE-MYALGIAS     Family History  Problem Relation Age of Onset   Cancer Sister 80       unknown - groin area   Heart attack Brother    Cancer Sister 32       pancreatic    Heart attack Sister    Heart attack Brother     Prior to Admission medications   Medication Sig Start Date End Date Taking? Authorizing Provider  acetaminophen (TYLENOL) 325 MG tablet  Take 2 tablets (650 mg total) by mouth every 6 (six) hours as needed for mild pain or moderate pain. 01/10/23   Hongalgi, Maximino Greenland, MD  acetaminophen-codeine (TYLENOL #3) 300-30 MG tablet Take 1 tablet by mouth every 12 (twelve) hours as needed for severe pain. 01/10/23   Hongalgi, Maximino Greenland, MD  carbidopa-levodopa (SINEMET IR) 25-100 MG tablet Take 1 tablet by mouth at bedtime. 07/19/22   [provider]  cholecalciferol (CHOLECALCIFEROL) 25 MCG tablet Take 1 tablet (1,000 Units total) by mouth daily. 01/11/23   Hongalgi, Maximino Greenland, MD  clopidogrel (PLAVIX) 75 MG tablet Take 75 mg by  mouth daily. 07/19/22   [provider]  ferrous sulfate 325 (65 FE) MG EC tablet Take 1 tablet (325 mg total) by mouth 2 (two) times daily. 04/23/22 04/23/23  Arnetha Courser, MD  furosemide (LASIX) 20 MG tablet Take 1 tablet (20 mg total) by mouth every other day. 09/09/22   Charise Killian, MD  ipratropium-albuterol (DUONEB) 0.5-2.5 (3) MG/3ML SOLN Take 3 mLs by nebulization every 4 (four) hours as needed (shortness of breath.). Patient not taking: Reported on 01/07/2023 06/06/22   Delfino Lovett, MD  latanoprost (XALATAN) 0.005 % ophthalmic solution Place 1 drop into both eyes at bedtime. 05/30/17   [provider]  magnesium hydroxide (MILK OF MAGNESIA) 400 MG/5ML suspension Take 30 mLs by mouth daily as needed for mild constipation.    [provider]  metoprolol succinate (TOPROL-XL) 100 MG 24 hr tablet Take 1 tablet (100 mg total) by mouth daily. Take with or immediately following a meal. 08/06/22   [name], MD  ondansetron (ZOFRAN-ODT) 4 MG disintegrating tablet Take 1 tablet (4 mg total) by mouth every 8 (eight) hours as needed for nausea or vomiting. 02/12/24   [name], MD  oseltamivir (TAMIFLU) 75 MG capsule Take 1 capsule (75 mg total) by mouth 2 (two) times daily for 5 days. 02/12/24 02/17/24  [name], MD  pantoprazole (PROTONIX) 40 MG tablet Take 1 tablet (40 mg total) by mouth 2 (two) times daily before a meal. 01/10/23   Hongalgi, Maximino Greenland, MD  potassium chloride SA (KLOR-CON M) 20 MEQ tablet Take 1 tablet (20 mEq total) by mouth every other day. Take along with Lasix. 01/10/23   Hongalgi, Maximino Greenland, MD  senna (SENOKOT) 8.6 MG tablet Take 2 tablets (17.2 mg total) by mouth 2 (two) times daily. May crush, mix with water and give sublingually if needed. 06/06/22   Delfino Lovett, MD  TRELEGY ELLIPTA 100-62.5-25 MCG/ACT AEPB Inhale 1 Inhalation into the lungs daily. 12/17/22   [provider]  vitamin B-12 1000 MCG tablet Take 1 tablet (1,000 mcg total)  by mouth daily. 04/23/22   Arnetha Courser, MD    Physical Exam: Vitals:   02/13/24 0328 02/13/24 0332 02/13/24 0630  BP:  (!) 159/99 (!) 159/74  Pulse:  84 80  Resp:  (!) 27 (!) 26  Temp:  98.9 F (37.2 C)   TempSrc:  Oral   SpO2: 99% 98% 100%   Physical Exam Constitutional:      Appearance: She is normal weight.  HENT:     Head: Normocephalic and atraumatic.     Nose: Nose normal.  Eyes:     Pupils: Pupils are equal, round, and reactive to light.  Cardiovascular:     Rate and Rhythm: Normal rate and regular rhythm.  Pulmonary:     Effort: Pulmonary effort is normal.     Breath sounds: Wheezing  present.  Abdominal:     General: Bowel sounds are normal.  Musculoskeletal:        General: Normal range of motion.  Skin:    General: Skin is warm.  Neurological:     General: No focal deficit present.  Psychiatric:        Mood and Affect: Mood normal.     Data Reviewed:  There are no new results to review at this time.  DG Chest 2 View CLINICAL DATA:  [name].  Cough and congestion.  EXAM: CHEST - 2 VIEW  COMPARISON:  Chest radiograph dated 01/06/2023.  FINDINGS: Stable mild cardiomegaly. Aortic atherosclerosis. No focal consolidation, pleural effusion, or pneumothorax. No acute osseous abnormality.  IMPRESSION: 1. No acute cardiopulmonary findings. 2. Stable mild cardiomegaly.  Electronically Signed   By: Hart Robinsons M.D.   On: 02/12/2024 09:36  Lab Results  Component Value Date   WBC 10.5 02/13/2024   HGB 12.6 02/13/2024   HCT 38.3 02/13/2024   MCV 104.9 (H) 02/13/2024   PLT 165 02/13/2024   Last metabolic panel Lab Results  Component Value Date   GLUCOSE 163 (H) 02/13/2024   NA 139 02/13/2024   K 3.7 02/13/2024   CL 104 02/13/2024   CO2 24 02/13/2024   BUN 20 02/13/2024   CREATININE 0.92 02/13/2024   GFRNONAA 57 (L) 02/13/2024   CALCIUM 8.5 (L) 02/13/2024   PROT 5.7 (L) 01/07/2023   ALBUMIN 3.2 (L) 01/07/2023   BILITOT  1.2 01/07/2023   ALKPHOS 57 01/07/2023   AST 14 (L) 01/07/2023   ALT <5 01/07/2023   ANIONGAP 11 02/13/2024    Assessment and Plan: * Acute respiratory failure with hypoxia (HCC) Influenza A  Sarcoidosis  Pulmonary Hypertension  Decompensated respiratory failure requiring 4L [place] in setting of recent influenza A diagnosis with baseline pulmonary hypertension and sarcoidosis  CXR stable apart from cardiomegaly  + diffuse wheezing  IV solumedrol  Duonebs  Gently diurese  Continue supplemental O2  Monitor   Parkinson disease (HCC) Cont sinemet   Essential hypertension BP stable  Titrate home regimen    Coronary artery disease No active chest pain  Cont home regimen >Plavix and Toprol-XL  GERD with esophagitis High dose PPI  Prediabetes Blood sugar 160s SSI  Monitor sugars w/ steroid use       Advance Care Planning:   Code Status: Full Code   Consults: Palliative Care   Family Communication: No family at the bedside. Plan of care discussed w/ contacts Algene Dutch Quint and [employer] over the phone.   Severity of Illness: The appropriate patient status for this patient is INPATIENT. Inpatient status is judged to be reasonable and necessary in order to provide the required intensity of service to ensure the patient's safety. The patient's presenting symptoms, physical exam findings, and initial radiographic and laboratory data in the context of their chronic comorbidities is felt to place them at high risk for further clinical deterioration. Furthermore, it is not anticipated that the patient will be medically stable for discharge from the hospital within 2 midnights of admission.   * I certify that at the point of admission it is my clinical judgment that the patient will require inpatient hospital care spanning beyond 2 midnights from the point of admission due to high intensity of service, high risk for further deterioration and high frequency of surveillance  required.*  Author: Floydene Flock, MD 02/13/2024 9:01 AM  For on call review [email or web address].

## 2024-02-13 NOTE — Evaluation (Signed)
Physical Therapy Evaluation Patient Details Name: Sheila Long MRN: [id number] DOB: 1924-12-18 Today's Date: 02/13/2024  History of Present Illness  Sheila Long is a 89 y.o. female with medical history significant of Parkinson's disease, multiple falls, chronic Diastolic CHF, CAD, GERD, neuropathy, sarcoidosis presenting w/ acute on chronic resp failure w/ hypoxia, influenza A. Pt noted to have been diagnosed yesterday with influenza A.  Came from home SOB with new O2 requirement. + cough and wheezing.   Clinical Impression  Patient received on stretcher attempting to eat lunch. She is HOH. Patient requires max encouragement to participate in PT/OT. She has STM deficits. Patient required mod A for bed mobility. Cga for sit to stand and Cga for ambulation with RW in room. O2 sats on room air at 100%. Patient will continue to benefit from skilled PT to improve strength and independence.         If plan is discharge home, recommend the following: A little help with walking and/or transfers;A little help with bathing/dressing/bathroom;Assistance with feeding;Direct supervision/assist for financial management;Assistance with cooking/housework;Assist for transportation;Help with stairs or ramp for entrance;Direct supervision/assist for medications management   Can travel by private vehicle   No    Equipment Recommendations None recommended by PT  Recommendations for Other Services       Functional Status Assessment Patient has had a recent decline in their functional status and demonstrates the ability to make significant improvements in function in a reasonable and predictable amount of time.     Precautions / Restrictions Precautions Precautions: Fall Recall of Precautions/Restrictions: Impaired Restrictions Weight Bearing Restrictions Per Provider Order: No      Mobility  Bed Mobility Overal bed mobility: Needs Assistance Bed Mobility: Supine to Sit, Sit to Supine      Supine to sit: Mod assist Sit to supine: Mod assist   General bed mobility comments: Mod A to raise trunk to sitting edge of bed. Mod A to bring LEs back up on bed    Transfers Overall transfer level: Needs assistance Equipment used: Rolling walker (2 wheels) Transfers: Sit to/from Stand Sit to Stand: Contact guard assist                Ambulation/Gait Ambulation/Gait assistance: Contact guard assist Gait Distance (Feet): 20 Feet Assistive device: Rolling walker (2 wheels) Gait Pattern/deviations: Step-through pattern, Decreased step length - left, Decreased step length - right, Decreased stride length Gait velocity: decr     General Gait Details: patient ambulated well with RW and cga. Short distance in room.  Stairs            Wheelchair Mobility     Tilt Bed    Modified Rankin (Stroke Patients Only)       Balance Overall balance assessment: Needs assistance Sitting-balance support: Feet unsupported Sitting balance-Leahy Scale: Fair     Standing balance support: Bilateral upper extremity supported, During functional activity, Reliant on assistive device for balance Standing balance-Leahy Scale: Fair                               Pertinent Vitals/Pain Pain Assessment Pain Assessment: No/denies pain    Home Living Family/patient expects to be discharged to:: Skilled nursing facility                   Additional Comments: Patient has caregiver, but apparently the caregiver had surgery and is not able to assist right now.    Prior Function  Prior Level of Function : Independent/Modified Independent;Needs assist       Physical Assist : Mobility (physical);ADLs (physical) Mobility (physical): Bed mobility;Transfers;Gait ADLs (physical): Feeding;Grooming;Bathing;Dressing;Toileting;IADLs Mobility Comments: Mod I at home ADLs Comments: Has PCA that assists at home     Extremity/Trunk Assessment   Upper Extremity  Assessment Upper Extremity Assessment: Defer to OT evaluation    Lower Extremity Assessment Lower Extremity Assessment: Generalized weakness    Cervical / Trunk Assessment Cervical / Trunk Assessment: Normal  Communication   Communication Communication: Impaired Factors Affecting Communication: Hearing impaired    Cognition Arousal: Alert Behavior During Therapy: WFL for tasks assessed/performed   PT - Cognitive impairments: Awareness, Memory, Attention, Problem solving, Safety/Judgement, Orientation   Orientation impairments: Situation, Time                   PT - Cognition Comments: patient with poor STM Following commands: Intact       Cueing Cueing Techniques: Verbal cues, Gestural cues     General Comments      Exercises     Assessment/Plan    PT Assessment Patient needs continued PT services  PT Problem List Decreased strength;Decreased activity tolerance;Decreased balance;Decreased mobility;Decreased safety awareness;Decreased cognition       PT Treatment Interventions DME instruction;Functional mobility training;Therapeutic activities;Therapeutic exercise;Balance training;Gait training;Neuromuscular re-education;Cognitive remediation;Patient/family education    PT Goals (Current goals can be found in the Care Plan section)  Acute Rehab PT Goals Patient Stated Goal: to get stronger PT Goal Formulation: With patient Time For Goal Achievement: 02/27/24 Potential to Achieve Goals: Good    Frequency Min 1X/week     Co-evaluation               AM-PAC PT "6 Clicks" Mobility  Outcome Measure Help needed turning from your back to your side while in a flat bed without using bedrails?: A Lot Help needed moving from lying on your back to sitting on the side of a flat bed without using bedrails?: A Lot Help needed moving to and from a bed to a chair (including a wheelchair)?: A Little Help needed standing up from a chair using your arms (e.g.,  wheelchair or bedside chair)?: A Little Help needed to walk in hospital room?: A Little Help needed climbing 3-5 steps with a railing? : Total 6 Click Score: 14    End of Session Equipment Utilized During Treatment: Oxygen Activity Tolerance: Patient limited by fatigue Patient left: in bed;with call bell/phone within reach Nurse Communication: Mobility status PT Visit Diagnosis: Other abnormalities of gait and mobility (R26.89);Muscle weakness (generalized) (M62.81);Difficulty in walking, not elsewhere classified (R26.2)    Time: 1610-9604 PT Time Calculation (min) (ACUTE ONLY): 23 min   Charges:   PT Evaluation $PT Eval Moderate Complexity: 1 Mod   PT General Charges $$ ACUTE PT VISIT: 1 Visit         Suprina Mandeville, PT, GCS 02/13/24,1:33 PM

## 2024-02-13 NOTE — Evaluation (Signed)
Occupational Therapy Evaluation Patient Details Name: Sheila Long MRN: [id number] DOB: [birth year] Today's Date: 02/13/2024   History of Present Illness   Pt is a 89 y.o. female admitted with acute respiratory failure with hypoxia and Influenza A. PMH significant for Parkinson's disease, multiple falls, chronic Diastolic CHF, CAD, GERD, neuropathy, sarcoidosis, HTN, hx of R clavicle and rib fx (Jan 2024)     Clinical Impressions Pt received on stretcher, requiring encouragement to participate (perseverates on talking to MD before getting up with therapies). Pt on 2L O2 via [place] at 100%, trial of RA during session with sats remaining 98-100%. Pt very HOH, demonstrates cognitive and memory deficits. Unsure of PLOF and home setup, pt unable to answer questions. Pt requires modA for bed mobility, completes short bout of mobility with RW and CGA in room. Anticipate up to maxA for LB ADLs. Pt demonstrates deficits in cognition, balance, strength and activity tolerance. Will require 24/7 supervision and assist for ADL/IADL management at discharge. Patient will benefit from continued inpatient follow up therapy, <3 hours/day      If plan is discharge home, recommend the following:   A little help with walking and/or transfers;A little help with bathing/dressing/bathroom;Assistance with cooking/housework;Assistance with feeding;Direct supervision/assist for medications management;Direct supervision/assist for financial management;Assist for transportation;Help with stairs or ramp for entrance;Supervision due to cognitive status     Functional Status Assessment   Patient has had a recent decline in their functional status and demonstrates the ability to make significant improvements in function in a reasonable and predictable amount of time.     Equipment Recommendations   Other (comment)      Precautions/Restrictions   Precautions Precautions: Fall Recall of  Precautions/Restrictions: Impaired Restrictions Weight Bearing Restrictions Per Provider Order: No     Mobility Bed Mobility Overal bed mobility: Needs Assistance Bed Mobility: Supine to Sit, Sit to Supine     Supine to sit: Mod assist Sit to supine: Mod assist   General bed mobility comments: Mod A to raise trunk to sitting edge of bed. Mod A to bring LEs back up on bed    Transfers Overall transfer level: Needs assistance Equipment used: Rolling walker (2 wheels) Transfers: Sit to/from Stand Sit to Stand: Contact guard assist                  Balance Overall balance assessment: Needs assistance Sitting-balance support: Feet unsupported Sitting balance-Leahy Scale: Fair     Standing balance support: Bilateral upper extremity supported, During functional activity, Reliant on assistive device for balance Standing balance-Leahy Scale: Fair                             ADL either performed or assessed with clinical judgement   ADL Overall ADL's : Needs assistance/impaired Eating/Feeding: Minimal assistance;Bed level Eating/Feeding Details (indicate cue type and reason): pt having a difficult time locating items on tray, and opening containers. will need setup and min vcs Grooming: Minimal assistance;Standing       Lower Body Bathing: Maximal assistance;Sit to/from stand   Upper Body Dressing : Sitting;Set up   Lower Body Dressing: Maximal assistance Lower Body Dressing Details (indicate cue type and reason): maxA to don socks sitting EOB Toilet Transfer: Ambulation;Rolling walker (2 wheels);Contact guard assist Toilet Transfer Details (indicate cue type and reason): will need cues for safety and sequencing           General ADL Comments: pt fatigues quickly, anticipate up to  maxA for LB ADLs      Pertinent Vitals/Pain Pain Assessment Pain Assessment: No/denies pain     Extremity/Trunk Assessment Upper Extremity Assessment Upper  Extremity Assessment: Generalized weakness;RUE deficits/detail;LUE deficits/detail RUE Coordination: decreased fine motor LUE Coordination: decreased fine motor   Lower Extremity Assessment Lower Extremity Assessment: Generalized weakness   Cervical / Trunk Assessment Cervical / Trunk Assessment: Normal   Communication Communication Communication: Impaired Factors Affecting Communication: Hearing impaired   Cognition Arousal: Alert Behavior During Therapy: WFL for tasks assessed/performed Cognition: Cognition impaired, No family/caregiver present to determine baseline   Orientation impairments: Place, Time, Situation Awareness: Intellectual awareness impaired Memory impairment (select all impairments): Short-term memory Attention impairment (select first level of impairment): Focused attention                     Following commands: Impaired Following commands impaired: Follows one step commands with increased time     Cueing  General Comments   Cueing Techniques: Verbal cues;Gestural cues              Home Living Family/patient expects to be discharged to:: Skilled nursing facility Living Arrangements: Alone                               Additional Comments: Pt HOH and difficulties understanding / answering questions about home setup      Prior Functioning/Environment Prior Level of Function : Independent/Modified Independent;Needs assist       Physical Assist : Mobility (physical);ADLs (physical) Mobility (physical): Bed mobility;Transfers;Gait ADLs (physical): Feeding;Grooming;Bathing;Dressing;Toileting;IADLs Mobility Comments: Mod I at home ADLs Comments: Pt mentiones caregiver that is currently not available to assist due to having surgery    OT Problem List: Decreased strength;Decreased activity tolerance;Impaired balance (sitting and/or standing);Decreased cognition;Decreased knowledge of use of DME or AE   OT  Treatment/Interventions: Self-care/ADL training;Therapeutic exercise;Energy conservation;DME and/or AE instruction;Therapeutic activities;Balance training;Patient/family education      OT Goals(Current goals can be found in the care plan section)   Acute Rehab OT Goals OT Goal Formulation: Patient unable to participate in goal setting Time For Goal Achievement: 02/27/24 Potential to Achieve Goals: Fair   OT Frequency:  Min 1X/week    Co-evaluation PT/OT/SLP Co-Evaluation/Treatment: Yes Reason for Co-Treatment: Necessary to address cognition/behavior during functional activity;For patient/therapist safety PT goals addressed during session: Balance;Mobility/safety with mobility OT goals addressed during session: ADL's and self-care      AM-PAC OT "6 Clicks" Daily Activity     Outcome Measure Help from another person eating meals?: A Little Help from another person taking care of personal grooming?: A Little Help from another person toileting, which includes using toliet, bedpan, or urinal?: A Lot Help from another person bathing (including washing, rinsing, drying)?: A Lot Help from another person to put on and taking off regular upper body clothing?: A Little Help from another person to put on and taking off regular lower body clothing?: A Lot 6 Click Score: 15   End of Session Equipment Utilized During Treatment: Rolling walker (2 wheels) Nurse Communication: Mobility status  Activity Tolerance: Patient tolerated treatment well Patient left: in bed;with call bell/phone within reach  OT Visit Diagnosis: Unsteadiness on feet (R26.81);Muscle weakness (generalized) (M62.81);Other abnormalities of gait and mobility (R26.89)                Time: 3614-4315 OT Time Calculation (min): 23 min Charges:  OT General Charges $OT Visit: 1 Visit OT Evaluation $OT  Eval Low Complexity: 1 Low  Jovanie Verge L. Charlise Giovanetti, OTR/L  02/13/24, 3:48 PM

## 2024-02-13 NOTE — Consult Note (Signed)
 Consultation Note Date: 02/13/2024 at 1100  Patient Name: Sheila Long  DOB: 19-Jan-1924  MRN: [id number]  Age / Sex: 89 y.o., female  PCP: Enid Baas, MD Referring Physician: Andris Baumann, MD  HPI/Patient Profile: 89 y.o. female  with past medical history of Parkinson's, multiple falls, chronic diastolic CHF, CAD, GERD, neuropathy and sarcoidosis admitted on 02/13/2024 with shortness of breath with coughing and wheezing.  Patient was diagnosed with influenza A in the ED and discharged from ED yesterday.  She returned with increased shortness of breath and new oxygen requirements.  Patient is being treated for influenza with prednisone and Tamiflu.  PMT was consulted to support patient and family and goals of care discussions.   Clinical Assessment and Goals of Care: Extensive chart review completed prior to meeting patient including labs, vital signs, imaging, progress notes, orders, and available advanced directive documents from current and previous encounters. I then met with patient and her neighbor/caregiver Mrs. Whitaker at bedside in ED to discuss diagnosis prognosis, GOC, EOL wishes, disposition and options.  I introduced Palliative Medicine as specialized medical care for people living with serious illness. It focuses on providing relief from the symptoms and stress of a serious illness. The goal is to improve quality of life for both the patient and the family.  We discussed a brief life review of the patient.  Patient is a widow and never had children.  She worked as a Engineer, civil (consulting) in [place]. She lives at home alone.  Mrs. Whitaker helps take her to her doctors appointments, provides her with her meals, helps get her a bath, and watches out for her wellbeing.   We discussed patient's current illness-influenza A-and what it means in the larger context of patient's on-going  co-morbidities-Parkinson's and advanced age.    I attempted to elicit values and goals of care important to the patient.  Patient states she would never want anybody to perform surgery on her.  She is open to any other measures to sustain her life.  Difference between full code and DNR status reviewed in detail.  Mrs. Whitaker believes patient would never want to be on a ventilator.  However, patient shares that she wants anything and everything done to keep her alive.  When asked directly if she would ever be accepting of a ventilator she says she is not sure.   In the event that patient is unable to speak for herself or make her own medical decisions, patient states that she would want her neighbor misses what occurred to be her next of kin/surrogate decision maker.  I highlighted that his [place] law states, patient's reasonably available siblings would be her next of kin decision maker and less she creates an HCPOA.  States she would never want her sister to make decisions for her.  She shares that Mrs. Harlon Flor is like a sister to her, watches out for her, and that she can think of no one else who she would trust to make her decisions.  Discussed importance of advanced  care planning documentation.  Patient was open and receptive to creating an advance directive.  Spiritual care consult explained and placed.  Patient remains full code and full scope at this time.  Ongoing discussions to continue with PMT.  Primary Decision Maker PATIENT  Physical Exam Vitals reviewed.  Constitutional:      Appearance: She is obese.  HENT:     Head: Normocephalic.  Cardiovascular:     Rate and Rhythm: Normal rate.  Pulmonary:     Effort: Pulmonary effort is normal.  Musculoskeletal:     Cervical back: Normal range of motion.     Comments: Generalized weakness  Skin:    General: Skin is warm and dry.  Neurological:     Mental Status: She is alert and oriented to person, place, and time.  Psychiatric:         Mood and Affect: Mood normal. Mood is not anxious.        Behavior: Behavior normal.     Palliative Assessment/Data: 40%     Thank you for this consult. Palliative medicine will continue to follow and assist holistically.   Time Total: 75 minutes  Time spent includes: Detailed review of medical records (labs, imaging, vital signs), medically appropriate exam (mental status, respiratory, cardiac, skin), discussed with treatment team, counseling and educating patient, family and staff, documenting clinical information, medication management and coordination of care.  Signed by: Georgiann Cocker, DNP, FNP-BC Palliative Medicine   Please contact Palliative Medicine Team providers via [facility] for questions and concerns.

## 2024-02-13 NOTE — Assessment & Plan Note (Deleted)
Presented to the ED on 01/06/2023 following fall, right distal clavicular fracture and multiple right rib fractures.  Orthopedics consulted and recommend nonsurgical management for right distal clavicular fracture  Stable  Currently in SNF for short term rehab

## 2024-02-13 NOTE — Assessment & Plan Note (Signed)
No active chest pain  Cont home regimen >Plavix and Toprol-XL

## 2024-02-13 NOTE — Assessment & Plan Note (Signed)
Blood sugar 160s SSI  Monitor sugars w/ steroid use

## 2024-02-13 NOTE — Progress Notes (Signed)
   02/13/24 1600  Spiritual Encounters  Type of Visit Initial  Care provided to: Patient  Conversation partners present during encounter Nurse  Referral source Nurse (RN/NT/LPN)  Reason for visit Advance directives  OnCall Visit Yes  Spiritual Framework  Presenting Themes Caregiving needs;Other (comment) (Pt was concerned w/eating; wants her sister there; wants to go back to a facility she was at before her caregiver was unable to help. Helped her w/her food; nurse helped her call her sister. I will refer the AD to the AM Chaplain to pursue tomorrow.)  Patient Stress Factors Lack of caregivers  Interventions  Spiritual Care Interventions Made Established relationship of care and support;Compassionate presence;Reflective listening;Encouragement  Intervention Outcomes  Outcomes Connection to spiritual care;Awareness around self/spiritual resourses;Connection to values and goals of care;Awareness of support

## 2024-02-13 NOTE — Assessment & Plan Note (Signed)
Cont sinemet ?

## 2024-02-13 NOTE — Assessment & Plan Note (Addendum)
Influenza A  Sarcoidosis  Pulmonary Hypertension  Decompensated respiratory failure requiring 4L [place] in setting of recent influenza A diagnosis with baseline pulmonary hypertension and sarcoidosis  CXR stable apart from cardiomegaly  + diffuse wheezing  IV solumedrol  Duonebs  Gently diurese  Continue supplemental O2  Monitor

## 2024-02-13 NOTE — ED Provider Notes (Signed)
[facility] Provider Note    Event Date/Time   First MD Initiated Contact with Patient 02/13/24 0320     (approximate)   History   [name]   HPI  Sheila Long is a 89 y.o. female   Past medical history of CHF, CAD, hypertension, here with worsening shortness of breath after being discharged from the emergency department in stable condition diagnosed with influenza.  She has had respiratory infectious symptoms over a couple weeks but worsening over the last couple of days including productive cough and congestion  She is not on home oxygen.  After being discharged from the hospital earlier yesterday, she got worse at home and started feeling more short of breath with worsening cough.  She called EMS who noted that she was hypoxemic to the low 80s on room air requiring nonrebreather.  They were able to transition her to 4 L nasal cannula after given a DuoNeb.  Independent Historian contributed to assessment above: EMS as above  External Medical Documents Reviewed: Emergency department visit dated yesterday with the above history, with labs notable for influenza A positive      Physical Exam   Triage Vital Signs: ED Triage Vitals [02/13/24 0328]  Encounter Vitals Group     BP      Systolic BP Percentile      Diastolic BP Percentile      Pulse      Resp      Temp      Temp src      SpO2 99 %     Weight      Height      Head Circumference      Peak Flow      Pain Score 0     Pain Loc      Pain Education      Exclude from Growth Chart     Most recent vital signs: Vitals:   02/13/24 0328 02/13/24 0332  BP:  (!) 159/99  Pulse:  84  Resp:  (!) 27  Temp:  98.9 F (37.2 C)  SpO2: 99% 98%    General: Awake, no distress.  CV:  Good peripheral perfusion.  Resp:  Normal effort.  Abd:  No distention.  Other:  Increased work of breathing, tachypneic, wheezing throughout.  No focality to lung exam.   ED Results /  Procedures / Treatments   Labs (all labs ordered are listed, but only abnormal results are displayed) Labs Reviewed  BRAIN NATRIURETIC PEPTIDE  BASIC METABOLIC PANEL  CBC WITH DIFFERENTIAL/PLATELET      PROCEDURES:  Critical Care performed: Yes, see critical care procedure note(s)  .Critical Care  Performed by: Pilar Jarvis, MD Authorized by: Pilar Jarvis, MD   Critical care provider statement:    Critical care time (minutes):  30   Critical care was time spent personally by me on the following activities:  Development of treatment plan with patient or surrogate, discussions with consultants, evaluation of patient's response to treatment, examination of patient, ordering and review of laboratory studies, ordering and review of radiographic studies, ordering and performing treatments and interventions, pulse oximetry, re-evaluation of patient's condition and review of old charts    MEDICATIONS ORDERED IN ED: Medications  ipratropium-albuterol (DUONEB) 0.5-2.5 (3) MG/3ML nebulizer solution 6 mL (has no administration in time range)  methylPREDNISolone sodium succinate (SOLU-MEDROL) 125 mg/2 mL injection 125 mg (has no administration in time range)    External physician / consultants:  I  spoke with hospital medicine for admission and regarding care plan for this patient.   IMPRESSION / MDM / ASSESSMENT AND PLAN / ED COURSE  I reviewed the triage vital signs and the nursing notes.                                Patient's presentation is most consistent with acute presentation with potential threat to life or bodily function.  Differential diagnosis includes, but is not limited to, influenza A, viral infection, bacterial pneumonia, acute hypoxemic respiratory failure, COPD or asthma exacerbation   The patient is on the cardiac monitor to evaluate for evidence of arrhythmia and/or significant heart rate changes.  MDM:    Is a patient with known influenza A who is worsening  breathing now requiring oxygen support for acute hypoxemic respiratory failure.  Will require admission.  Lab work obtained earlier today as well as chest x-ray and viral testing positive for influenza, will repeat blood testing for this admission.  She was given a DuoNeb with improvement in her oxygenation, she has wheezing throughout but no history of COPD or asthma, will give another DuoNeb and Solu-Medrol for treatment.  She does not appear fluid overloaded and I do not think her CHF is being exacerbated at this time.       FINAL CLINICAL IMPRESSION(S) / ED DIAGNOSES   Final diagnoses:  Influenza A  Acute hypoxemic respiratory failure (HCC)     Rx / DC Orders   ED Discharge Orders     None        Note:  This document was prepared using Dragon voice recognition software and may include unintentional dictation errors.    Pilar Jarvis, MD 02/13/24 [phone]

## 2024-02-13 NOTE — Assessment & Plan Note (Signed)
High dose PPI.

## 2024-02-14 ENCOUNTER — Encounter: Payer: Self-pay | Admitting: Internal Medicine

## 2024-02-14 DIAGNOSIS — J9601 Acute respiratory failure with hypoxia: Secondary | ICD-10-CM | POA: Diagnosis not present

## 2024-02-14 LAB — COMPREHENSIVE METABOLIC PANEL
ALT: 11 U/L (ref 0–44)
AST: 24 U/L (ref 15–41)
Albumin: 3 g/dL — ABNORMAL LOW (ref 3.5–5.0)
Alkaline Phosphatase: 85 U/L (ref 38–126)
Anion gap: 8 (ref 5–15)
BUN: 26 mg/dL — ABNORMAL HIGH (ref 8–23)
CO2: 27 mmol/L (ref 22–32)
Calcium: 8.5 mg/dL — ABNORMAL LOW (ref 8.9–10.3)
Chloride: 105 mmol/L (ref 98–111)
Creatinine, Ser: 0.98 mg/dL (ref 0.44–1.00)
GFR, Estimated: 52 mL/min — ABNORMAL LOW (ref >60)
Glucose, Bld: 133 mg/dL — ABNORMAL HIGH (ref 70–99)
Potassium: 4 mmol/L (ref 3.5–5.1)
Sodium: 140 mmol/L (ref 135–145)
Total Bilirubin: 0.6 mg/dL (ref 0.0–1.2)
Total Protein: 5.8 g/dL — ABNORMAL LOW (ref 6.5–8.1)

## 2024-02-14 LAB — CBC
HCT: 34.1 % — ABNORMAL LOW (ref 36.0–46.0)
Hemoglobin: 11.6 g/dL — ABNORMAL LOW (ref 12.0–15.0)
MCH: 34.4 pg — ABNORMAL HIGH (ref 26.0–34.0)
MCHC: 34 g/dL (ref 30.0–36.0)
MCV: 101.2 fL — ABNORMAL HIGH (ref 80.0–100.0)
Platelets: 155 10*3/uL (ref 150–400)
RBC: 3.37 MIL/uL — ABNORMAL LOW (ref 3.87–5.11)
RDW: 14.1 % (ref 11.5–15.5)
WBC: 9.6 10*3/uL (ref 4.0–10.5)
nRBC: 0 % (ref 0.0–0.2)

## 2024-02-14 LAB — GLUCOSE, CAPILLARY: Glucose-Capillary: 110 mg/dL — ABNORMAL HIGH (ref 70–99)

## 2024-02-14 LAB — CBG MONITORING, ED: Glucose-Capillary: 120 mg/dL — ABNORMAL HIGH (ref 70–99)

## 2024-02-14 MED ORDER — METOPROLOL SUCCINATE ER 100 MG PO TB24
100.0000 mg | ORAL_TABLET | Freq: Every day | ORAL | Status: DC
  Administered 2024-02-14 – 2024-02-18 (×5): 100 mg via ORAL
  Filled 2024-02-14 (×5): qty 1

## 2024-02-14 MED ORDER — DOCUSATE SODIUM 100 MG PO CAPS: 100.0000 mg | ORAL_CAPSULE | Freq: Two times a day (BID) | ORAL | Status: DC | PRN

## 2024-02-14 MED ORDER — MAGNESIUM CITRATE PO SOLN
1.0000 | Freq: Once | ORAL | Status: AC
  Administered 2024-02-14: 1 via ORAL
  Filled 2024-02-14: qty 296

## 2024-02-14 MED ORDER — IPRATROPIUM-ALBUTEROL 0.5-2.5 (3) MG/3ML IN SOLN
3.0000 mL | Freq: Four times a day (QID) | RESPIRATORY_TRACT | Status: DC
  Administered 2024-02-15: 3 mL via RESPIRATORY_TRACT
  Filled 2024-02-14: qty 3

## 2024-02-14 NOTE — Plan of Care (Signed)
   Problem: Coping: Goal: Ability to adjust to condition or change in health will improve Outcome: Progressing

## 2024-02-14 NOTE — Progress Notes (Signed)
  PROGRESS NOTE    Sheila Long  NGE:[id number] DOB: [birth year] DOA: 02/13/2024 PCP: Enid Baas, MD  204A/204A-AA  LOS: 1 day   Brief hospital course:   Assessment & Plan: Bret Stamour is a 89 y.o. female with medical history significant of Parkinson's disease, multiple falls, chronic Diastolic CHF, CAD, GERD, neuropathy, sarcoidosis presenting w/ complaints of dyspnea and cough, found to have influenza A.    * Acute respiratory failure with hypoxia (HCC) Influenza A  Sarcoidosis  Pulmonary Hypertension  Decompensated respiratory failure requiring 4L [place] in setting of recent influenza A diagnosis with baseline pulmonary hypertension and sarcoidosis  CXR stable apart from cardiomegaly  + diffuse wheezing  --received IV solumedrol in the ED --already on room air the next day. Plan: --cont Tamiflu --cont prednisone 40 mg daily --cont DuoNeb scheduled  Parkinson disease (HCC) Cont sinemet   Essential hypertension --cont home Lasix --resume home Toprol  Coronary artery disease No active chest pain  --cont home plavix  GERD with esophagitis --cont home PPI BID  Prediabetes --A1c 5.8 --d/c BG checks and SSI  Dysuria --pt reported burning with urination, which only developed after presentation, so likely due to the presence of puriwick.   --trial lower suctioning or removing puriwick.   DVT prophylaxis: Lovenox SQ Code Status: Full code  Family Communication:  Level of care: Telemetry Medical Dispo:   The patient is from: home Anticipated d/c is to: SNF rehab Anticipated d/c date is: whenever bed available   Subjective and Interval History:  Pt's main complaint was burning with urination, which only developed after presentation, so likely due to the presence of puriwick.     Objective: Vitals:   02/14/24 0627 02/14/24 0800 02/14/24 0900 02/14/24 1129  BP:  (!) 154/75 (!) 176/97 (!) 148/81  Pulse:  75 84 78  Resp:  (!) 28 (!) 21 16   Temp: 97.7 F (36.5 C)   98.1 F (36.7 C)  TempSrc: Oral     SpO2:  97% 97% 99%  Weight:      Height:       No intake or output data in the 24 hours ending 02/14/24 1630 Filed Weights   02/13/24 1430  Weight: 83.9 kg    Examination:   Constitutional: NAD, AAOx3 HEENT: conjunctivae and lids normal, EOMI CV: No cyanosis.   RESP: normal respiratory effort, on RA Neuro: II - XII grossly intact.   Psych: Normal mood and affect.     Data Reviewed: I have personally reviewed labs and imaging studies  Time spent: 50 minutes  Darlin Priestly, MD [facility] If 7PM-7AM, please contact night-coverage 02/14/2024, 4:30 PM

## 2024-02-15 DIAGNOSIS — J9601 Acute respiratory failure with hypoxia: Secondary | ICD-10-CM | POA: Diagnosis not present

## 2024-02-15 MED ORDER — IPRATROPIUM-ALBUTEROL 0.5-2.5 (3) MG/3ML IN SOLN
3.0000 mL | RESPIRATORY_TRACT | Status: DC | PRN
  Administered 2024-02-17: 3 mL via RESPIRATORY_TRACT
  Filled 2024-02-15: qty 3

## 2024-02-15 NOTE — NC FL2 (Signed)
 [place] MEDICAID FL2 LEVEL OF CARE FORM     IDENTIFICATION  Patient Name: Sheila Long Birthdate: 1924-04-09 Sex: female Admission Date (Current Location): 02/13/2024  [facility] and [place] Number:  Chiropodist and Address:  [facility], [address] [place], [place] [address]      Provider Number: [id number]  Attending Physician Name and Address:  Darlin Priestly, MD  Relative Name and Phone Number:  Malena Catholic (Sister)  [phone] [facility])    Current Level of Care: Hospital Recommended Level of Care: Skilled Nursing Facility Prior Approval Number:    Date Approved/Denied:   PASRR Number: [id number]  Discharge Plan:      Current Diagnoses: Patient Active Problem List   Diagnosis Date Noted   Acute respiratory failure with hypoxia (HCC) 02/13/2024   Fall 01/06/2023   Multiple fractures 01/06/2023   COVID-19 virus infection 08/28/2022   Essential hypertension 08/28/2022   Acute kidney injury superimposed on chronic kidney disease (HCC) 08/28/2022   GERD with esophagitis 08/28/2022   Generalized weakness    UTI (urinary tract infection) 08/27/2022   Acute on chronic congestive heart failure [facility])    Hospice care    Acute heart failure with preserved ejection fraction (HCC) 06/02/2022   Obesity (BMI 30-39.9) 06/02/2022   Stage 3a chronic kidney disease (CKD) (HCC) 06/01/2022   Atypical chest pain 06/01/2022   Acute on chronic anemia 04/21/2022   Parkinson disease (HCC) 04/21/2022   Leg swelling 04/21/2022   Dizziness 01/15/2021   Carpal tunnel syndrome 12/11/2020   PND (post-nasal drip)    Stenosis of right carotid artery    Acute kidney injury superimposed on CKD (HCC)    Elevated troponin 11/18/2020   [name] 11/17/2020   Back pain without sciatica 11/07/2020   Numbness in both hands 11/07/2020   Numbness 10/05/2019   Axillary abscess 11/12/2018   Status post total replacement of both hips 10/04/2018    Prediabetes 10/30/2017   Acute on chronic diastolic CHF (congestive heart failure) (HCC) 10/29/2017   Fatigue 07/29/2017   Mood disorder (HCC) 07/29/2017   Constipation 06/26/2017   S/P total knee arthroplasty 06/23/2017   Pulmonary hypertension associated with sarcoidosis (HCC) 02/03/2017   Lower extremity edema 11/28/2016   Familial tremor 09/24/2016   Hypertension    Allergic rhinitis due to pollen    Coronary artery disease    Aortic atherosclerosis (HCC)    GERD (gastroesophageal reflux disease)    PAD (peripheral artery disease) (HCC)    Osteoarthritis of both knees    Obstructive sleep apnea    Neuropathy    Dyslipidemia 11/19/2013   Thyroid nodule 11/19/2013    Orientation RESPIRATION BLADDER Height & Weight     Self, Time, Situation, Place  Normal External catheter Weight: 184 lb 15.5 oz (83.9 kg) Height:  5\' 4"  (162.6 cm)  BEHAVIORAL SYMPTOMS/MOOD NEUROLOGICAL BOWEL NUTRITION STATUS        Diet (reg)  AMBULATORY STATUS COMMUNICATION OF NEEDS Skin   Limited Assist Verbally Normal                       Personal Care Assistance Level of Assistance  Bathing, Feeding, Dressing Bathing Assistance: Limited assistance Feeding assistance: Limited assistance Dressing Assistance: Limited assistance     Functional Limitations Info  Hearing   Hearing Info: Impaired (hard of hearing)      SPECIAL CARE FACTORS FREQUENCY  PT (By licensed PT), OT (By licensed OT)  PT Frequency: 5 times per week OT Frequency: 5 times per week            Contractures      Additional Factors Info  Code Status, Allergies, Isolation Precautions Code Status Info: full Allergies Info: Tramadol, Sulfa Antibiotics, Zocor (Simvastatin)     Isolation Precautions Info: droplet     Current Medications (02/15/2024):  This is the current hospital active medication list Current Facility-Administered Medications  Medication Dose Route Frequency Provider Last Rate Last Admin    acetaminophen (TYLENOL) tablet 650 mg  650 mg Oral Q6H PRN Andris Baumann, MD       Or   acetaminophen (TYLENOL) suppository 650 mg  650 mg Rectal Q6H PRN Andris Baumann, MD       carbidopa-levodopa (SINEMET IR) 25-100 MG per tablet immediate release 1 tablet  1 tablet Oral QHS Floydene Flock, MD   1 tablet at 02/14/24 2208   clopidogrel (PLAVIX) tablet 75 mg  75 mg Oral Daily Floydene Flock, MD   75 mg at 02/15/24 0916   docusate sodium (COLACE) capsule 100 mg  100 mg Oral BID PRN Darlin Priestly, MD       enoxaparin (LOVENOX) injection 40 mg  40 mg Subcutaneous Q24H Floydene Flock, MD   40 mg at 02/14/24 2208   escitalopram (LEXAPRO) tablet 5 mg  5 mg Oral Daily Floydene Flock, MD   5 mg at 02/15/24 1610   furosemide (LASIX) tablet 20 mg  20 mg Oral Carlean Jews, MD   20 mg at 02/14/24 1044   guaiFENesin-dextromethorphan (ROBITUSSIN DM) 100-10 MG/5ML syrup 5 mL  5 mL Oral Q4H PRN Floydene Flock, MD   5 mL at 02/15/24 0345   HYDROcodone-acetaminophen (NORCO/VICODIN) 5-325 MG per tablet 1-2 tablet  1-2 tablet Oral Q4H PRN Andris Baumann, MD       ipratropium-albuterol (DUONEB) 0.5-2.5 (3) MG/3ML nebulizer solution 3 mL  3 mL Nebulization Q4H PRN Darlin Priestly, MD       latanoprost (XALATAN) 0.005 % ophthalmic solution 1 drop  1 drop Both Eyes QHS Manuela Schwartz, NP   1 drop at 02/14/24 2207   metoprolol succinate (TOPROL-XL) 24 hr tablet 100 mg  100 mg Oral Daily Darlin Priestly, MD   100 mg at 02/15/24 9604   oseltamivir (TAMIFLU) capsule 30 mg  30 mg Oral BID Barrie Folk, RPH   30 mg at 02/15/24 0926   pantoprazole (PROTONIX) EC tablet 40 mg  40 mg Oral BID AC Floydene Flock, MD   40 mg at 02/15/24 5409   predniSONE (DELTASONE) tablet 40 mg  40 mg Oral Q breakfast Floydene Flock, MD   40 mg at 02/15/24 8119     Discharge Medications: Please see discharge summary for a list of discharge medications.  Relevant Imaging Results:  Relevant Lab Results:   Additional  Information SS #: [id number]  Marisa Hufstetler E Hinda Lindor, LCSW

## 2024-02-15 NOTE — TOC CM/SW Note (Signed)
 Attempted to speak with patient about SNF recs. Patient requests TOC follow up later. Will reattempt when able.  Alfonso Ramus, LCSW Transitions of Care Department [phone]

## 2024-02-15 NOTE — Plan of Care (Signed)

## 2024-02-15 NOTE — Progress Notes (Signed)
  PROGRESS NOTE    Sheila Long  ZDG:[id number] DOB: [birth year] DOA: 02/13/2024 PCP: Enid Baas, MD  204A/204A-AA  LOS: 2 days   Brief hospital course:   Assessment & Plan: [name] is a 89 y.o. female with medical history significant of Parkinson's disease, multiple falls, chronic Diastolic CHF, CAD, GERD, neuropathy, sarcoidosis presenting w/ complaints of dyspnea and cough, found to have influenza A.    * Acute respiratory failure with hypoxia (HCC) Influenza A  Sarcoidosis  Pulmonary Hypertension  Decompensated respiratory failure requiring 4L [place] in setting of recent influenza A diagnosis with baseline pulmonary hypertension and sarcoidosis  CXR stable apart from cardiomegaly  + diffuse wheezing  --received IV solumedrol in the ED --already on room air the next day. Plan: --cont Tamiflu --cont prednisone 40 mg daily --cont DuoNeb scheduled  Parkinson disease (HCC) Cont sinemet   Essential hypertension --cont lasix and Toprol  Coronary artery disease No active chest pain  --cont home plavix  GERD with esophagitis --cont home PPI BID  Prediabetes --A1c 5.8 --d/c'ed BG checks and SSI  Dysuria --pt reported burning with urination, which only developed after presentation, so likely due to the presence of puriwick.   --trial lower suctioning or removing puriwick.   DVT prophylaxis: Lovenox SQ Code Status: Full code  Family Communication:  Level of care: Telemetry Medical Dispo:   The patient is from: home Anticipated d/c is to: SNF rehab Anticipated d/c date is: whenever bed available   Subjective and Interval History:  Pt was alert and talking.  No complaints.   Objective: Vitals:   02/15/24 0337 02/15/24 0751 02/15/24 0758 02/15/24 1629  BP: (!) 147/71 (!) 167/69  (!) 180/72  Pulse: 73 69  69  Resp:  16  16  Temp: 98.1 F (36.7 C) 98 F (36.7 C)  97.8 F (36.6 C)  TempSrc: Oral Oral    SpO2: 96% 94% 97% 95%  Weight:       Height:        Intake/Output Summary (Last 24 hours) at 02/15/2024 1740 Last data filed at 02/15/2024 1540 Gross per 24 hour  Intake 100 ml  Output 500 ml  Net -400 ml   Filed Weights   02/13/24 1430  Weight: 83.9 kg    Examination:   Constitutional: NAD, alert HEENT: conjunctivae and lids normal, EOMI CV: No cyanosis.   RESP: normal respiratory effort, on RA Neuro: II - XII grossly intact.     Data Reviewed: I have personally reviewed labs and imaging studies  Time spent: 35 minutes  Darlin Priestly, MD [facility] If 7PM-7AM, please contact night-coverage 02/15/2024, 5:40 PM

## 2024-02-15 NOTE — TOC Initial Note (Signed)
 Transition of Care [facility]) - Initial/Assessment Note    Patient Details  Name: Sheila Long MRN: [id number] Date of Birth: 06-Apr-1924  [facility]) CM/SW Contact:    Liliana Cline, LCSW Phone Number: 02/15/2024, 3:04 PM  Clinical Narrative:                 Patient is from home alone. She has an aide who comes a few times a week. Her neighbors Luisa Hart and [place]ud[place] are involved and supportive. PT recommends SNF. Patient is agreeable. Patient prefers [facility] as she has been there in the past. CSW started SNF work up.  Expected Discharge Plan: Skilled Nursing Facility Barriers to Discharge: Continued Medical Work up   Patient Goals and CMS Choice Patient states their goals for this hospitalization and ongoing recovery are:: STR CMS Medicare.gov Compare Post Acute Care list provided to:: Patient Choice offered to / list presented to : Patient      Expected Discharge Plan and Services       Living arrangements for the past 2 months: Single Family Home                                      Prior Living Arrangements/Services Living arrangements for the past 2 months: Single Family Home Lives with:: Self Patient language and need for interpreter reviewed:: Yes Do you feel safe going back to the place where you live?: Yes      Need for Family Participation in Patient Care: Yes (Comment) Care giver support system in place?: Yes (comment) Current home services: DME, Homehealth aide Criminal Activity/Legal Involvement Pertinent to Current Situation/Hospitalization: No - Comment as needed  Activities of Daily Living   ADL Screening (condition at time of admission) Independently performs ADLs?: Yes (appropriate for developmental age) Is the patient deaf or have difficulty hearing?: Yes Does the patient have difficulty seeing, even when wearing glasses/contacts?: No Does the patient have difficulty concentrating, remembering, or making decisions?:  No  Permission Sought/Granted Permission sought to share information with : Facility Industrial/product designer granted to share information with : Yes, Verbal Permission Granted     Permission granted to share info w AGENCY: SNFs  Permission granted to share info w Relationship: neighbors - Luisa Hart and Brud[place]     Emotional Assessment       Orientation: : Oriented to Self, Oriented to Place, Oriented to  Time, Oriented to Situation Alcohol / Substance Use: Not Applicable Psych Involvement: No (comment)  Admission diagnosis:  Influenza A [J10.1] Acute respiratory failure with hypoxia (HCC) [J96.01] Acute hypoxemic respiratory failure (HCC) [J96.01] Patient Active Problem List   Diagnosis Date Noted   Acute respiratory failure with hypoxia (HCC) 02/13/2024   Fall 01/06/2023   Multiple fractures 01/06/2023   COVID-19 virus infection 08/28/2022   Essential hypertension 08/28/2022   Acute kidney injury superimposed on chronic kidney disease (HCC) 08/28/2022   GERD with esophagitis 08/28/2022   Generalized weakness    UTI (urinary tract infection) 08/27/2022   Acute on chronic congestive heart failure (HCC)    Hospice care    Acute heart failure with preserved ejection fraction (HCC) 06/02/2022   Obesity (BMI 30-39.9) 06/02/2022   Stage 3a chronic kidney disease (CKD) (HCC) 06/01/2022   Atypical chest pain 06/01/2022   Acute on chronic anemia 04/21/2022   Parkinson disease (HCC) 04/21/2022   Leg swelling 04/21/2022   Dizziness 01/15/2021  Carpal tunnel syndrome 12/11/2020   PND (post-nasal drip)    Stenosis of right carotid artery    Acute kidney injury superimposed on CKD (HCC)    Elevated troponin 11/18/2020   [name] 11/17/2020   Back pain without sciatica 11/07/2020   Numbness in both hands 11/07/2020   Numbness 10/05/2019   Axillary abscess 11/12/2018   Status post total replacement of both hips 10/04/2018   Prediabetes 10/30/2017   Acute on  chronic diastolic CHF (congestive heart failure) (HCC) 10/29/2017   Fatigue 07/29/2017   Mood disorder (HCC) 07/29/2017   Constipation 06/26/2017   S/P total knee arthroplasty 06/23/2017   Pulmonary hypertension associated with sarcoidosis (HCC) 02/03/2017   Lower extremity edema 11/28/2016   Familial tremor 09/24/2016   Hypertension    Allergic rhinitis due to pollen    Coronary artery disease    Aortic atherosclerosis (HCC)    GERD (gastroesophageal reflux disease)    PAD (peripheral artery disease) (HCC)    Osteoarthritis of both knees    Obstructive sleep apnea    Neuropathy    Dyslipidemia 11/19/2013   Thyroid nodule 11/19/2013   PCP:  Enid Baas, MD Pharmacy:   [id number] - [place], [place] - [address] [place] [address] Phone: [phone] Fax: [phone]     Social Drivers of Health (SDOH) Social History: SDOH Screenings   Food Insecurity: No Food Insecurity (02/14/2024)  Housing: Low Risk  (02/14/2024)  Transportation Needs: No Transportation Needs (02/14/2024)  Utilities: Not At Risk (02/14/2024)  Alcohol Screen: Low Risk  (10/11/2021)  Depression (PHQ2-9): Low Risk  (10/11/2021)  Financial Resource Strain: Low Risk  (09/02/2023)   Received from [facility]  Physical Activity: Inactive (10/11/2021)  Social Connections: Moderately Integrated (02/14/2024)  Stress: No Stress Concern Present (10/11/2021)  Tobacco Use: Low Risk  (02/14/2024)   SDOH Interventions:     Readmission Risk Interventions     No data to display

## 2024-02-16 DIAGNOSIS — Z515 Encounter for palliative care: Secondary | ICD-10-CM | POA: Diagnosis not present

## 2024-02-16 DIAGNOSIS — J9601 Acute respiratory failure with hypoxia: Secondary | ICD-10-CM | POA: Diagnosis not present

## 2024-02-16 DIAGNOSIS — Z7189 Other specified counseling: Secondary | ICD-10-CM

## 2024-02-16 DIAGNOSIS — J101 Influenza due to other identified influenza virus with other respiratory manifestations: Secondary | ICD-10-CM | POA: Diagnosis not present

## 2024-02-16 MED ORDER — FUROSEMIDE 20 MG PO TABS
20.0000 mg | ORAL_TABLET | Freq: Every day | ORAL | Status: DC
  Administered 2024-02-17 – 2024-02-18 (×2): 20 mg via ORAL
  Filled 2024-02-16 (×2): qty 1

## 2024-02-16 MED ORDER — HYDRALAZINE HCL 20 MG/ML IJ SOLN
10.0000 mg | Freq: Four times a day (QID) | INTRAMUSCULAR | Status: DC | PRN
  Administered 2024-02-16: 10 mg via INTRAVENOUS
  Filled 2024-02-16: qty 1

## 2024-02-16 NOTE — Progress Notes (Signed)
   02/16/24 1300  Spiritual Encounters  Type of Visit Initial;Attempt (pt unavailable)  Care provided to: Patient  Referral source Nurse (RN/NT/LPN)  Reason for visit Advance directives  OnCall Visit No   Chaplain received a spiritual consult for an advance directive. Chaplain went to visit the patient and she was sleeping when chaplain arrived. Chaplain services remain available to assist patient when she is ready.

## 2024-02-16 NOTE — Progress Notes (Signed)
 Daily Progress Note   Patient Name: Sheila Long       Date: 02/16/2024 DOB: 10-Mar-1924  Age: 89 y.o. MRN#: [id number] Attending Physician: Sheila Priestly, MD Primary Care Physician: Sheila Baas, MD Admit Date: 02/13/2024  Reason for Consultation/Follow-up: Establishing goals of care  Subjective: Feeling better - still coughing up mucous States she has been constipated but had a BM earlier today (Per nursing documentation had med BM this AM and large last night)  Length of Stay: 3  Current Medications: Scheduled Meds:   carbidopa-levodopa  1 tablet Oral QHS   clopidogrel  75 mg Oral Daily   enoxaparin (LOVENOX) injection  40 mg Subcutaneous Q24H   escitalopram  5 mg Oral Daily   furosemide  20 mg Oral QODAY   latanoprost  1 drop Both Eyes QHS   metoprolol succinate  100 mg Oral Daily   oseltamivir  30 mg Oral BID   pantoprazole  40 mg Oral BID AC   predniSONE  40 mg Oral Q breakfast    Continuous Infusions:   PRN Meds: acetaminophen **OR** acetaminophen, docusate sodium, guaiFENesin-dextromethorphan, HYDROcodone-acetaminophen, ipratropium-albuterol  Physical Exam Constitutional:      General: She is not in acute distress.    Appearance: She is ill-appearing.     Comments: pleasant  Pulmonary:     Effort: Pulmonary effort is normal. No tachypnea or accessory muscle usage.     Comments: Not requiring oxygen Skin:    General: Skin is warm and dry.  Neurological:     Mental Status: She is alert and oriented to person, place, and time.  Psychiatric:        Mood and Affect: Mood normal.        Behavior: Behavior normal.             Vital Signs: BP (!) 182/80 (BP Location: Right Arm)   Pulse 70   Temp 98.4 F (36.9 C) (Oral)   Resp 20   Ht 5\' 4"  (1.626 m)   Wt 83.9 kg    SpO2 98%   BMI 31.75 kg/m  SpO2: SpO2: 98 % O2 Device: O2 Device: Room Air O2 Flow Rate: O2 Flow Rate (L/min): 2 L/min  Intake/output summary:  Intake/Output Summary (Last 24 hours) at 02/16/2024 1046 Last data filed at 02/15/2024 1846 Gross per 24 hour  Intake 240 ml  Output 500 ml  Net -260 ml   LBM: Last BM Date : 02/11/24 Baseline Weight: Weight: 83.9 kg Most recent weight: Weight: 83.9 kg       Palliative Assessment/Data: PPS 40%      Patient Active Problem List   Diagnosis Date Noted   Acute respiratory failure with hypoxia (HCC) 02/13/2024   Fall 01/06/2023   Multiple fractures 01/06/2023   COVID-19 virus infection 08/28/2022   Essential hypertension 08/28/2022   Acute kidney injury superimposed on chronic kidney disease (HCC) 08/28/2022   GERD with esophagitis 08/28/2022   Generalized weakness    UTI (urinary tract infection) 08/27/2022   Acute on chronic congestive heart failure (HCC)    Hospice care    Acute heart failure with preserved ejection fraction (HCC) 06/02/2022   Obesity (BMI 30-39.9) 06/02/2022   Stage 3a  chronic kidney disease (CKD) (HCC) 06/01/2022   Atypical chest pain 06/01/2022   Acute on chronic anemia 04/21/2022   Parkinson disease (HCC) 04/21/2022   Leg swelling 04/21/2022   Dizziness 01/15/2021   Carpal tunnel syndrome 12/11/2020   PND (post-nasal drip)    Stenosis of right carotid artery    Acute kidney injury superimposed on CKD (HCC)    Elevated troponin 11/18/2020   [name] 11/17/2020   Back pain without sciatica 11/07/2020   Numbness in both hands 11/07/2020   Numbness 10/05/2019   Axillary abscess 11/12/2018   Status post total replacement of both hips 10/04/2018   Prediabetes 10/30/2017   Acute on chronic diastolic CHF (congestive heart failure) (HCC) 10/29/2017   Fatigue 07/29/2017   Mood disorder (HCC) 07/29/2017   Constipation 06/26/2017   S/P total knee arthroplasty 06/23/2017   Pulmonary hypertension  associated with sarcoidosis (HCC) 02/03/2017   Lower extremity edema 11/28/2016   Familial tremor 09/24/2016   Hypertension    Allergic rhinitis due to pollen    Coronary artery disease    Aortic atherosclerosis (HCC)    GERD (gastroesophageal reflux disease)    PAD (peripheral artery disease) (HCC)    Osteoarthritis of both knees    Obstructive sleep apnea    Neuropathy    Dyslipidemia 11/19/2013   Thyroid nodule 11/19/2013    Palliative Care Assessment & Plan   HPI: 89 y.o. female  with past medical history of Parkinson's, multiple falls, chronic diastolic CHF, CAD, GERD, neuropathy and sarcoidosis admitted on 02/13/2024 with shortness of breath with coughing and wheezing.  Patient was diagnosed with influenza A in the ED and discharged from ED yesterday.  She returned with increased shortness of breath and new oxygen requirements.   Patient is being treated for influenza with prednisone and Tamiflu.   PMT was consulted to support patient and family and goals of care discussions.   Assessment: Follow up today with [name]. She reports she is doing well; no complaints We talk about the plan to go to rehab soon - she is eager to go.  Her only worry right now is that I help her apply lip stick.  We review conversation with my coworker Sheila Deist, NP last week. We review patient had previously stated she wanted her neighbor and caregiver Sheila Long to serve as HCPOA if patient were unable to make decisions for herself - she confirms this is accurate but that's her sister Sheila Long could also assist Sheila Long with decision making. We discuss HCPOA documentation - she does not think this has been done yet and agrees to spiritual care consult to assist with completion.  We also review her code status. She tells me the only kind of medical care she would not want is surgery (this is consistent with what she told Sheila Long last week). She is open to intubation and CPR. I share with her  this is something to spend some time thinking about and discussing with her caregiver and sister. I tell her I worry she would not have a good outcome with these interventions. She tells me she will think about it but is not ready to make any decisions today. I tell her I will request these conversations be continued at [facility] with their palliative providers. She agrees. No other questions or concerns.   Recommendations/Plan: Ongoing code status conversations - to continue at [facility] **Discharging MD please request palliative services in discharge summary at [facility]** Patient would like to name her  caregiver/neighbor Sheila Long as her HCPOA - with her sister Sheila Long as well - requested spiritual care to assist with documentation Constipation resolved  Goals of Care and Additional Recommendations: Limitations on Scope of Treatment: No Surgical Procedures  Code Status: Full code  Discharge Planning: Skilled Nursing Facility for rehab with Palliative care service follow-up  Care plan was discussed with patient  Thank you for allowing the Palliative Medicine Team to assist in the care of this patient.   Total Time 30 minutes Prolonged Time Billed  no   Time spent includes: Detailed review of medical records (labs, imaging, vital signs), medically appropriate exam, discussion with treatment team, counseling and educating patient, family and/or staff, documenting clinical information, medication management and coordination of care.     *Please note that this is a verbal dictation therefore any spelling or grammatical errors are due to the "[employer] One" system interpretation.  Gerlean Ren, DNP, [facility] Palliative Medicine Team Team Phone # [phone]  Pager [phone]

## 2024-02-16 NOTE — Progress Notes (Signed)
 Physical Therapy Treatment Patient Details Name: Sheila Long MRN: [id number] DOB: [birth year] Today's Date: 02/16/2024   History of Present Illness Pt is a 89 y.o. female admitted with acute respiratory failure with hypoxia and Influenza A. PMH significant for Parkinson's disease, multiple falls, chronic Diastolic CHF, CAD, GERD, neuropathy, sarcoidosis, HTN, hx of R clavicle and rib fx (Jan 2024)    PT Comments  Pt resting in bed upon PT arrival; pt agreeable to therapy.  During session pt mod assist with bed mobility; min assist to stand from bed up to RW; and CGA to ambulate 30 feet with RW use.  Limited distance ambulating d/t generalized weakness and fatigue; mild SOB noted with activity (SpO2 sats 92% or greater on room air during sessions activities).  Will continue to focus on strengthening, balance, and progressive functional mobility during hospitalization.    If plan is discharge home, recommend the following: A little help with walking and/or transfers;A little help with bathing/dressing/bathroom;Assistance with feeding;Direct supervision/assist for financial management;Assistance with cooking/housework;Assist for transportation;Help with stairs or ramp for entrance;Direct supervision/assist for medications management   Can travel by private vehicle     No  Equipment Recommendations  None recommended by PT    Recommendations for Other Services       Precautions / Restrictions Precautions Precautions: Fall Restrictions Weight Bearing Restrictions Per Provider Order: No     Mobility  Bed Mobility Overal bed mobility: Needs Assistance Bed Mobility: Supine to Sit, Sit to Supine     Supine to sit: Mod assist (assist for trunk) Sit to supine: Mod assist (assist for B LE's)        Transfers Overall transfer level: Needs assistance Equipment used: Rolling walker (2 wheels) Transfers: Sit to/from Stand Sit to Stand: Min assist           General transfer  comment: assist to stand from hospital bed    Ambulation/Gait Ambulation/Gait assistance: Contact guard assist Gait Distance (Feet): 30 Feet Assistive device: Rolling walker (2 wheels) Gait Pattern/deviations: Step-through pattern, Decreased step length - right, Decreased step length - left Gait velocity: decreased     General Gait Details: forward flexed posture; limited distance d/t pt fatigue   Stairs             Wheelchair Mobility     Tilt Bed    Modified Rankin (Stroke Patients Only)       Balance Overall balance assessment: Needs assistance Sitting-balance support: No upper extremity supported, Feet supported Sitting balance-Leahy Scale: Good Sitting balance - Comments: steady reaching within BOS   Standing balance support: Bilateral upper extremity supported, Reliant on assistive device for balance Standing balance-Leahy Scale: Fair Standing balance comment: requires UE support for static standing balance                            Communication Communication Communication: Impaired Factors Affecting Communication: Hearing impaired  Cognition Arousal: Alert Behavior During Therapy: WFL for tasks assessed/performed   PT - Cognitive impairments: Awareness, Memory, Attention, Problem solving, Safety/Judgement, Orientation   Orientation impairments: Time                     Following commands: Impaired Following commands impaired: Follows one step commands with increased time    Cueing Cueing Techniques: Verbal cues, Gestural cues  Exercises      General Comments  Pt agreeable to PT session.      Pertinent Vitals/Pain Pain Assessment Pain  Assessment: Faces Faces Pain Scale: Hurts little more Pain Location: R lateral distal thigh incision Pain Descriptors / Indicators: Discomfort Pain Intervention(s): Limited activity within patient's tolerance, Monitored during session, Repositioned (RN notified) HR stable during session.     Home Living                          Prior Function            PT Goals (current goals can now be found in the care plan section) Acute Rehab PT Goals Patient Stated Goal: to get stronger PT Goal Formulation: With patient Time For Goal Achievement: 02/27/24 Potential to Achieve Goals: Good Progress towards PT goals: Progressing toward goals    Frequency    Min 1X/week      PT Plan      Co-evaluation              AM-PAC PT "6 Clicks" Mobility   Outcome Measure  Help needed turning from your back to your side while in a flat bed without using bedrails?: A Lot Help needed moving from lying on your back to sitting on the side of a flat bed without using bedrails?: A Lot Help needed moving to and from a bed to a chair (including a wheelchair)?: A Little Help needed standing up from a chair using your arms (e.g., wheelchair or bedside chair)?: A Little Help needed to walk in hospital room?: A Little Help needed climbing 3-5 steps with a railing? : Total 6 Click Score: 14    End of Session Equipment Utilized During Treatment: Gait belt Activity Tolerance: Patient limited by fatigue Patient left: in bed;with call bell/phone within reach;with bed alarm set Nurse Communication: Mobility status;Precautions;Other (comment) (Pt needing new purewick and bath in general (d/t purewick not working)) PT Visit Diagnosis: Other abnormalities of gait and mobility (R26.89);Muscle weakness (generalized) (M62.81);Difficulty in walking, not elsewhere classified (R26.2)     Time: 1650-1710 PT Time Calculation (min) (ACUTE ONLY): 20 min  Charges:    $Therapeutic Activity: 8-22 mins PT General Charges $$ ACUTE PT VISIT: 1 Visit                     Hendricks Limes, PT 02/16/24, 5:27 PM

## 2024-02-16 NOTE — Progress Notes (Signed)
  PROGRESS NOTE    Sheila Long  ZOX:[id number] DOB: [birth year] DOA: 02/13/2024 PCP: Enid Baas, MD  204A/204A-AA  LOS: 3 days   Brief hospital course:   Assessment & Plan: Sheila Long is a 89 y.o. female with medical history significant of Parkinson's disease, multiple falls, chronic Diastolic CHF, CAD, GERD, neuropathy, sarcoidosis presenting w/ complaints of dyspnea and cough, found to have influenza A.    * Acute respiratory failure with hypoxia (HCC) Influenza A  Sarcoidosis  Pulmonary Hypertension  Decompensated respiratory failure requiring 4L [place] in setting of recent influenza A diagnosis with baseline pulmonary hypertension and sarcoidosis  CXR stable apart from cardiomegaly  + diffuse wheezing  --received IV solumedrol in the ED --already on room air the next day. Plan: --cont Tamiflu --cont prednisone 40 mg daily --cont DuoNeb scheduled  Parkinson disease (HCC) Cont sinemet   Essential hypertension --BP elevated --cont lasix and Toprol --IV hydralazine PRN  Coronary artery disease No active chest pain  --cont home plavix  GERD with esophagitis --cont home PPI BID  Prediabetes --A1c 5.8 --d/c'ed BG checks and SSI  Dysuria --pt reported burning with urination, which only developed after presentation.  Pt said burning resolved after she drank more fluids.   DVT prophylaxis: Lovenox SQ Code Status: Full code  Family Communication: care giver updated at bedside today Level of care: Telemetry Medical Dispo:   The patient is from: home Anticipated d/c is to: SNF rehab Anticipated d/c date is: whenever bed available   Subjective and Interval History:  Pt complained of having mucus that she kept having to bring up.  Pt said no more burning with urination after drinking more fluids.   Objective: Vitals:   02/15/24 1629 02/15/24 1932 02/16/24 0214 02/16/24 0838  BP: (!) 180/72 (!) 172/97 (!) 159/80 (!) 182/80  Pulse: 69 (!) 57  63 70  Resp: 16 (!) 24 20 20   Temp: 97.8 F (36.6 C) 97.9 F (36.6 C) 97.9 F (36.6 C) 98.4 F (36.9 C)  TempSrc:   Oral Oral  SpO2: 95% 97% 96% 98%  Weight:      Height:        Intake/Output Summary (Last 24 hours) at 02/16/2024 1753 Last data filed at 02/15/2024 1846 Gross per 24 hour  Intake 240 ml  Output --  Net 240 ml   Filed Weights   02/13/24 1430  Weight: 83.9 kg    Examination:   Constitutional: NAD, AAOx3 HEENT: conjunctivae and lids normal, EOMI CV: No cyanosis.   RESP: normal respiratory effort, on RA Neuro: II - XII grossly intact.     Data Reviewed: I have personally reviewed labs and imaging studies  Time spent: 35 minutes  Darlin Priestly, MD [facility] If 7PM-7AM, please contact night-coverage 02/16/2024, 5:53 PM

## 2024-02-17 DIAGNOSIS — J9601 Acute respiratory failure with hypoxia: Secondary | ICD-10-CM | POA: Diagnosis not present

## 2024-02-17 MED ORDER — LISINOPRIL 20 MG PO TABS
20.0000 mg | ORAL_TABLET | Freq: Every day | ORAL | Status: DC
  Administered 2024-02-17 – 2024-02-18 (×2): 20 mg via ORAL
  Filled 2024-02-17 (×2): qty 1

## 2024-02-17 NOTE — TOC Progression Note (Signed)
 Transition of Care [facility]) - Progression Note    Patient Details  Name: Sheila Long MRN: [id number] Date of Birth: 04-21-1924  Transition of Care [facility]) CM/SW Contact  Chapman Fitch, RN Phone Number: 02/17/2024, 10:53 AM  Clinical Narrative:     Met with patient at bedside. Bed offers presented Patient accepts bed at [facility] Accepted bed in [place] and notified Theodoro Grist at [facility]kley Harvey started with Tammy at [facility] for SNF and [facility]  Expected Discharge Plan: Skilled Nursing Facility Barriers to Discharge: Continued Medical Work up  Expected Discharge Plan and Services       Living arrangements for the past 2 months: Single Family Home                                       Social Determinants of Health (SDOH) Interventions SDOH Screenings   Food Insecurity: No Food Insecurity (02/14/2024)  Housing: Low Risk  (02/14/2024)  Transportation Needs: No Transportation Needs (02/14/2024)  Utilities: Not At Risk (02/14/2024)  Alcohol Screen: Low Risk  (10/11/2021)  Depression (PHQ2-9): Low Risk  (10/11/2021)  Financial Resource Strain: Low Risk  (09/02/2023)   Received from [facility]  Physical Activity: Inactive (10/11/2021)  Social Connections: Moderately Integrated (02/14/2024)  Stress: No Stress Concern Present (10/11/2021)  Tobacco Use: Low Risk  (02/14/2024)    Readmission Risk Interventions     No data to display

## 2024-02-17 NOTE — Plan of Care (Signed)

## 2024-02-17 NOTE — Evaluation (Addendum)
 Occupational Therapy Evaluation Patient Details Name: Sheila Long MRN: [id number] DOB: 13-Feb-1924 Today's Date: 02/17/2024   History of Present Illness   Pt is a 89 y.o. female admitted with acute respiratory failure with hypoxia and Influenza A. PMH significant for Parkinson's disease, multiple falls, chronic Diastolic CHF, CAD, GERD, neuropathy, sarcoidosis, HTN, hx of R clavicle and rib fx (Jan 2024)     Clinical Impressions Pt seen for OT tx. MIN A assist required for donning heading aides. Pt required MOD A and increased time/effort to complete bed mobility, MIN A to STS with VC for hand placement with RW, and CGA for taking a couple steps to [facility] with VC again for sequencing and hand placement to improve safety with ADL transfers. CGA in standing while completing pericare. SpO2 97% on RW, HR in high 50's. Pt eager to discharge to rehab to get stronger. Continues to benefit from skilled OT services.      If plan is discharge home, recommend the following:   A little help with walking and/or transfers;A little help with bathing/dressing/bathroom;Assistance with cooking/housework;Assistance with feeding;Direct supervision/assist for medications management;Direct supervision/assist for financial management;Assist for transportation;Help with stairs or ramp for entrance;Supervision due to cognitive status     Functional Status Assessment         Equipment Recommendations   Other (comment) (defer)     Recommendations for Other Services         Precautions/Restrictions   Precautions Precautions: Fall Restrictions Weight Bearing Restrictions Per Provider Order: No     Mobility Bed Mobility                    Transfers                          Balance                                           ADL either performed or assessed with clinical judgement   ADL Overall ADL's : Needs assistance/impaired                          Toilet Transfer: Rolling walker (2 wheels);BSC/3in1 Toilet Transfer Details (indicate cue type and reason): step pivot with VC for hand placement/sequencing Toileting- Clothing Manipulation and Hygiene: Contact guard assist;Minimal assistance;Sit to/from stand       Functional mobility during ADLs: Contact guard assist;Rolling walker (2 wheels)       Vision         Perception         Praxis         Pertinent Vitals/Pain       Extremity/Trunk Assessment             Communication     Cognition                                             Cueing  General Comments          Exercises     Shoulder Instructions      Home Living  Prior Functioning/Environment                      OT Problem List:     OT Treatment/Interventions: Self-care/ADL training;Therapeutic exercise;Energy conservation;DME and/or AE instruction;Therapeutic activities;Balance training;Patient/family education      OT Goals(Current goals can be found in the care plan section)   Acute Rehab OT Goals Patient Stated Goal: get better OT Goal Formulation: With patient Time For Goal Achievement: 02/27/24 Potential to Achieve Goals: Good   OT Frequency:  Min 1X/week    Co-evaluation              AM-PAC OT "6 Clicks" Daily Activity     Outcome Measure Help from another person eating meals?: None Help from another person taking care of personal grooming?: A Little Help from another person toileting, which includes using toliet, bedpan, or urinal?: A Little Help from another person bathing (including washing, rinsing, drying)?: A Lot Help from another person to put on and taking off regular upper body clothing?: A Little Help from another person to put on and taking off regular lower body clothing?: A Lot 6 Click Score: 17   End of Session Equipment Utilized During Treatment:  Rolling walker (2 wheels)  Activity Tolerance: Patient tolerated treatment well Patient left: in bed;with call bell/phone within reach;with bed alarm set  OT Visit Diagnosis: Unsteadiness on feet (R26.81);Muscle weakness (generalized) (M62.81);Other abnormalities of gait and mobility (R26.89)                Time: 7253-6644 OT Time Calculation (min): 24 min Charges:  OT General Charges $OT Visit: 1 Visit OT Treatments $Self Care/Home Management : 23-37 mins  Arman Filter., MPH, MS, OTR/L ascom [phone] 02/17/24, 11:00 AM

## 2024-02-17 NOTE — Progress Notes (Addendum)
  PROGRESS NOTE    Sheila Long  ZHY:[id number] DOB: 30-Jan-1924 DOA: 02/13/2024 PCP: Enid Baas, MD  204A/204A-AA  LOS: 4 days   Brief hospital course:   Assessment & Plan: [name] is a 89 y.o. female with medical history significant of Parkinson's disease, multiple falls, chronic Diastolic CHF, CAD, GERD, neuropathy, sarcoidosis presenting w/ complaints of dyspnea and cough, found to have influenza A.    * Acute respiratory failure with hypoxia (HCC) Influenza A  Sarcoidosis  Pulmonary Hypertension  Decompensated respiratory failure requiring 4L [place] in setting of recent influenza A diagnosis with baseline pulmonary hypertension and sarcoidosis  CXR stable apart from cardiomegaly  + diffuse wheezing  --already on room air the next day. --received IV solumedrol f/b prednisone 40 mg daily and completed steroid burst. Plan: --cont Tamiflu  Parkinson disease (HCC) --cont home Sinemet  Essential hypertension --BP elevated --cont lasix and Toprol --add Lisinopril 20 mg daily --IV hydralazine PRN  Coronary artery disease No active chest pain  --cont home plavix  GERD with esophagitis --cont home PPI BID  Prediabetes --A1c 5.8 --d/c'ed BG checks and SSI  Dysuria --pt reported burning with urination, which only developed after presentation.  Pt said burning resolved after she drank more fluids.   Obesity class I, BMI:  31.75   DVT prophylaxis: Lovenox SQ Code Status: Full code  Family Communication:  Level of care: Telemetry Medical Dispo:   The patient is from: home Anticipated d/c is to: SNF rehab Anticipated d/c date is: whenever bed available   Subjective and Interval History:  Pt again complained of feeling of mucus choking her, though said this is a chronic problem.   Objective: Vitals:   02/17/24 0828 02/17/24 1533 02/17/24 1611 02/17/24 2006  BP: (!) 187/70 (!) 146/78  (!) 120/54  Pulse: 63 68  68  Resp: 20 (!) 22  20   Temp: 98.2 F (36.8 C) 98.4 F (36.9 C)  97.9 F (36.6 C)  TempSrc: Oral Oral  Oral  SpO2: 98% 96% 97% 96%  Weight:      Height:        Intake/Output Summary (Last 24 hours) at 02/17/2024 2039 Last data filed at 02/17/2024 1050 Gross per 24 hour  Intake 520 ml  Output 250 ml  Net 270 ml   Filed Weights   02/13/24 1430  Weight: 83.9 kg    Examination:   Constitutional: NAD, AAOx3 HEENT: conjunctivae and lids normal, EOMI CV: No cyanosis.   RESP: normal respiratory effort, on RA Neuro: II - XII grossly intact.   Psych: Normal mood and affect.     Data Reviewed: I have personally reviewed labs and imaging studies  Time spent: 35 minutes  Darlin Priestly, MD [facility] If 7PM-7AM, please contact night-coverage 02/17/2024, 8:39 PM

## 2024-02-18 ENCOUNTER — Other Ambulatory Visit: Payer: Self-pay

## 2024-02-18 DIAGNOSIS — J9601 Acute respiratory failure with hypoxia: Secondary | ICD-10-CM | POA: Diagnosis not present

## 2024-02-18 MED ORDER — PANTOPRAZOLE SODIUM 40 MG PO TBEC
40.0000 mg | DELAYED_RELEASE_TABLET | Freq: Two times a day (BID) | ORAL | 0 refills | Status: DC
  Filled 2024-02-18: qty 14, 7d supply, fill #0

## 2024-02-18 MED ORDER — CARBIDOPA-LEVODOPA 25-100 MG PO TABS
1.0000 | ORAL_TABLET | Freq: Every day | ORAL | 0 refills | Status: AC
  Filled 2024-02-18: qty 7, 7d supply, fill #0

## 2024-02-18 MED ORDER — LISINOPRIL 20 MG PO TABS
20.0000 mg | ORAL_TABLET | Freq: Every day | ORAL | 0 refills | Status: DC
  Filled 2024-02-18: qty 7, 7d supply, fill #0

## 2024-02-18 MED ORDER — CLOPIDOGREL BISULFATE 75 MG PO TABS
75.0000 mg | ORAL_TABLET | Freq: Every day | ORAL | 0 refills | Status: AC
  Filled 2024-02-18: qty 7, 7d supply, fill #0

## 2024-02-18 MED ORDER — ESCITALOPRAM OXALATE 5 MG PO TABS
5.0000 mg | ORAL_TABLET | Freq: Every day | ORAL | 0 refills | Status: AC
Start: 2024-02-18 — End: 2025-04-22
  Filled 2024-02-18: qty 7, 7d supply, fill #0

## 2024-02-18 MED ORDER — LATANOPROST 0.005 % OP SOLN
1.0000 [drp] | Freq: Every day | OPHTHALMIC | 0 refills | Status: AC
  Filled 2024-02-18: qty 2.5, 50d supply, fill #0

## 2024-02-18 MED ORDER — METOPROLOL SUCCINATE ER 100 MG PO TB24
100.0000 mg | ORAL_TABLET | Freq: Every day | ORAL | 0 refills | Status: DC
  Filled 2024-02-18: qty 7, 7d supply, fill #0

## 2024-02-18 MED ORDER — FERROUS SULFATE 325 (65 FE) MG PO TABS
325.0000 mg | ORAL_TABLET | Freq: Two times a day (BID) | ORAL | 0 refills | Status: DC
  Filled 2024-02-18: qty 14, 7d supply, fill #0

## 2024-02-18 MED ORDER — ONDANSETRON 4 MG PO TBDP
4.0000 mg | ORAL_TABLET | Freq: Three times a day (TID) | ORAL | 0 refills | Status: AC | PRN
  Filled 2024-02-18: qty 8, 3d supply, fill #0

## 2024-02-18 MED ORDER — ACETAMINOPHEN 325 MG PO TABS
650.0000 mg | ORAL_TABLET | Freq: Four times a day (QID) | ORAL | 0 refills | Status: AC | PRN
  Filled 2024-02-18: qty 28, 4d supply, fill #0

## 2024-02-18 MED ORDER — FUROSEMIDE 20 MG PO TABS
20.0000 mg | ORAL_TABLET | ORAL | 0 refills | Status: DC
  Filled 2024-02-18: qty 4, 8d supply, fill #0

## 2024-02-18 MED ORDER — CYANOCOBALAMIN 1000 MCG PO TABS
1000.0000 ug | ORAL_TABLET | Freq: Every day | ORAL | 0 refills | Status: AC
  Filled 2024-02-18: qty 7, 7d supply, fill #0

## 2024-02-18 MED ORDER — MAGNESIUM HYDROXIDE 400 MG/5ML PO SUSP
30.0000 mL | Freq: Every day | ORAL | 0 refills | Status: AC | PRN
  Filled 2024-02-18: qty 210, 7d supply, fill #0
  Filled 2024-02-18: qty 473, 15d supply, fill #0

## 2024-02-18 MED ORDER — SENNOSIDES 8.6 MG PO TABS
2.0000 | ORAL_TABLET | Freq: Two times a day (BID) | ORAL | 0 refills | Status: AC
  Filled 2024-02-18: qty 14, 4d supply, fill #0

## 2024-02-18 MED ORDER — VITAMIN D (ERGOCALCIFEROL) 1.25 MG (50000 UNIT) PO CAPS
50000.0000 [IU] | ORAL_CAPSULE | ORAL | 0 refills | Status: AC
  Filled 2024-02-18: qty 1, 7d supply, fill #0

## 2024-02-18 MED ORDER — VITAMIN D3 25 MCG PO TABS
1000.0000 [IU] | ORAL_TABLET | Freq: Every day | ORAL | 0 refills | Status: AC
  Filled 2024-02-18: qty 7, 7d supply, fill #0

## 2024-02-18 NOTE — TOC Progression Note (Signed)
 Transition of Care [facility]) - Progression Note    Patient Details  Name: Sheila Long MRN: [id number] Date of Birth: [birth year]  Transition of Care [facility]) CM/SW Contact  Chapman Fitch, RN Phone Number: 02/18/2024, 9:16 AM  Clinical Narrative:     Insurance Engineer, materials   Expected Discharge Plan: Skilled Nursing Facility Barriers to Discharge: Continued Medical Work up  [facility]       Living arrangements for the past 2 months: Single Family Home                                       Social Determinants of Health (SDOH) Interventions SDOH Screenings   Food Insecurity: No Food Insecurity (02/14/2024)  Housing: Low Risk  (02/14/2024)  Transportation Needs: No Transportation Needs (02/14/2024)  Utilities: Not At Risk (02/14/2024)  Alcohol Screen: Low Risk  (10/11/2021)  Depression (PHQ2-9): Low Risk  (10/11/2021)  Financial Resource Strain: Low Risk  (09/02/2023)   Received from [facility]  Physical Activity: Inactive (10/11/2021)  Social Connections: Moderately Integrated (02/14/2024)  Stress: No Stress Concern Present (10/11/2021)  Tobacco Use: Low Risk  (02/14/2024)    Readmission Risk Interventions     No data to display

## 2024-02-18 NOTE — TOC Transition Note (Signed)
 Transition of Care [facility]) - Discharge Note   Patient Details  Name: Sheila Long MRN: [id number] Date of Birth: 06-26-1924  Transition of Care [facility]) CM/SW Contact:  Chapman Fitch, RN Phone Number: 02/18/2024, 2:29 PM   Clinical Narrative:     Insurance auth received for SNF, EMS auth denied  Patient will DC to: [facility] DC date:02/18/24  Family notified: Friend Architectural technologist by: Johny Sax  Meds to bed delivered  Per MD patient ready for DC to . RN, patient, patient's friend, and facility notified of DC. Discharge Summary sent to facility. RN given number for report. DC packet on chart. .  TOC signing off.     Barriers to Discharge: Continued Medical Work up   Patient Goals and CMS Choice Patient states their goals for this hospitalization and ongoing recovery are:: STR CMS Medicare.gov Compare Post Acute Care list provided to:: Patient Choice offered to / list presented to : Patient      Discharge Placement                       Discharge Plan and Services Additional resources added to the After Visit Summary for                                       Social Drivers of Health (SDOH) Interventions SDOH Screenings   Food Insecurity: No Food Insecurity (02/14/2024)  Housing: Low Risk  (02/14/2024)  Transportation Needs: No Transportation Needs (02/14/2024)  Utilities: Not At Risk (02/14/2024)  Alcohol Screen: Low Risk  (10/11/2021)  Depression (PHQ2-9): Low Risk  (10/11/2021)  Financial Resource Strain: Low Risk  (09/02/2023)   Received from [facility]  Physical Activity: Inactive (10/11/2021)  Social Connections: Moderately Integrated (02/14/2024)  Stress: No Stress Concern Present (10/11/2021)  Tobacco Use: Low Risk  (02/14/2024)     Readmission Risk Interventions     No data to display

## 2024-02-18 NOTE — Discharge Summary (Addendum)
 Physician Discharge Summary  Sheila Long WUJ:[id number] DOB: 05-12-1924 DOA: 02/13/2024  PCP: Enid Baas, MD  Admit date: 02/13/2024 Discharge date: 02/18/2024  Admitted From: home  Disposition:  SNF  Recommendations for Outpatient Follow-up:  Follow up with PCP in 1-2 weeks  Home Health: no  Equipment/Devices:  Discharge Condition: stable  CODE STATUS: full Diet recommendation: Heart Healthy  Brief/Interim Summary: HPI was taken from Dr. Alvester Morin: Sheila Long is a 89 y.o. female with medical history significant of Parkinson's disease, multiple falls, chronic Diastolic CHF, CAD, GERD, neuropathy, sarcoidosis presenting w/ acute on chronic resp failure w/ hypoxia, influenza A. Pt noted to have been diagnosed yesterday with influenza A.  Came from home SOB with new O2 requirement. + cough and wheezing.  No reported abdominal pain, nausea or vomiting.  No reported focal hemiparesis or confusion.  Noted remote admission in January 2024 for multiple falls with clavicle and rib fractures conservatively managed.  No reports of fall at home. Presented to ER afebrile, hemodynamically stable. Requiring 4L [place] to keep O2 sats >98%.  White count 7.5, hemoglobin 12.6, platelets 165, BNP 240, creatinine 0.92, glucose 160s.  Chest x-ray grossly stable apart from mild cardiomegaly. EKG NSR w/ PVCs.     Discharge Diagnoses:  Principal Problem:   Acute respiratory failure with hypoxia (HCC) Active Problems:   Parkinson disease (HCC)   Fall   Coronary artery disease   Essential hypertension   Prediabetes   GERD with esophagitis  Acute hypoxic respiratory failure: secondary to influenza. Hx of sarcoidosis & pulmonary HTN. Weaned off of supplemental oxygen   Influenza A: completed tamiflu course. Continue w/ supportive care    Parkinson disease: continue on home dose of sinemet   HTN: continue on metoprolol, lasix & lisinopril.    Hx of CAD: continue on home dose of  metoprolol, plavix    GERD: continue on PPI    Prediabetes: HbA1c 5.8.   Dysuria: pt reported burning with urination, which only developed after presentation.  Pt said burning resolved after she drank more fluids. Resolved    Obesity: BMI 31.7. Would benefit from weight loss     Discharge Instructions  Discharge Instructions     Diet - low sodium heart healthy   Complete by: As directed    Discharge instructions   Complete by: As directed    F/u w/ PCP in 1-2 weeks   Increase activity slowly   Complete by: As directed        CONTINUE ON ALL MEDS BELOW X 30 DAYS MINIMUM. MEDICATION LIST SAYS 7 BECAUSE SNF REQUESTED ALL MEDS BE SENT WITH A 7 DAY SUPPLY.    Allergies as of 02/18/2024       Reactions   Tramadol Other (See Comments)   CONFUSION   Sulfa Antibiotics    Patient does not recall   Zocor [simvastatin] Other (See Comments)   INTOLERANCE-MYALGIAS        Medication List     STOP taking these medications    acetaminophen-codeine 300-30 MG tablet Commonly known as: TYLENOL #3   ipratropium-albuterol 0.5-2.5 (3) MG/3ML Soln Commonly known as: DUONEB   oseltamivir 75 MG capsule Commonly known as: Tamiflu   potassium chloride SA 20 MEQ tablet Commonly known as: KLOR-CON M   Trelegy Ellipta 100-62.5-25 MCG/ACT Aepb Generic drug: Fluticasone-Umeclidin-Vilant       TAKE these medications    acetaminophen 325 MG tablet Commonly known as: TYLENOL Take 2 tablets (650 mg total) by mouth every 6 (six)  hours as needed for up to 7 days for mild pain (pain score 1-3) or moderate pain (pain score 4-6).   carbidopa-levodopa 25-100 MG tablet Commonly known as: SINEMET IR Take 1 tablet by mouth at bedtime for 7 days.   clopidogrel 75 MG tablet Commonly known as: PLAVIX Take 1 tablet (75 mg total) by mouth daily for 7 days.   cyanocobalamin 1000 MCG tablet Take 1 tablet (1,000 mcg total) by mouth daily for 7 days.   escitalopram 5 MG tablet Commonly  known as: LEXAPRO Take 1 tablet (5 mg total) by mouth daily for 7 days.   ferrous sulfate 325 (65 FE) MG EC tablet Take 1 tablet (325 mg total) by mouth 2 (two) times daily for 7 days.   furosemide 20 MG tablet Commonly known as: LASIX Take 1 tablet (20 mg total) by mouth every other day for 7 days.   glycopyrrolate 1 MG tablet Commonly known as: ROBINUL Take 0.5 mg by mouth daily as needed.   latanoprost 0.005 % ophthalmic solution Commonly known as: XALATAN Place 1 drop into both eyes at bedtime for 7 days.   lisinopril 20 MG tablet Commonly known as: ZESTRIL Take 1 tablet (20 mg total) by mouth daily for 7 days. Start taking on: February 19, 2024   magnesium hydroxide 400 MG/5ML suspension Commonly known as: MILK OF MAGNESIA Take 30 mLs by mouth daily as needed for up to 7 days for mild constipation.   metoprolol succinate 100 MG 24 hr tablet Commonly known as: TOPROL-XL Take 1 tablet (100 mg total) by mouth daily for 7 days. Take with or immediately following a meal. Start taking on: February 19, 2024   ondansetron 4 MG disintegrating tablet Commonly known as: ZOFRAN-ODT Take 1 tablet (4 mg total) by mouth every 8 (eight) hours as needed for up to 7 days for nausea or vomiting.   pantoprazole 40 MG tablet Commonly known as: PROTONIX Take 1 tablet (40 mg total) by mouth 2 (two) times daily before a meal for 7 days.   senna 8.6 MG tablet Commonly known as: Senokot Take 2 tablets (17.2 mg total) by mouth 2 (two) times daily for 7 days. May crush, mix with water and give sublingually if needed.   Vitamin D (Ergocalciferol) 1.25 MG (50000 UNIT) Caps capsule Commonly known as: DRISDOL Take 1 capsule (50,000 Units total) by mouth once a week for 7 days.   vitamin D3 25 MCG tablet Commonly known as: CHOLECALCIFEROL Take 1 tablet (1,000 Units total) by mouth daily for 7 days.        Contact information for after-discharge care     Destination     [facility] Preferred SNF .   Service: Skilled Nursing Contact information: [address] [place] [phone]                    Allergies  Allergen Reactions   Tramadol Other (See Comments)    CONFUSION   Sulfa Antibiotics     Patient does not recall   Zocor [Simvastatin] Other (See Comments)    INTOLERANCE-MYALGIAS     Consultations:    Procedures/Studies: DG Chest 2 View Result Date: 02/12/2024 CLINICAL DATA:  [name].  Cough and congestion. EXAM: CHEST - 2 VIEW COMPARISON:  Chest radiograph dated 01/06/2023. FINDINGS: Stable mild cardiomegaly. Aortic atherosclerosis. No focal consolidation, pleural effusion, or pneumothorax. No acute osseous abnormality. IMPRESSION:  1. No acute cardiopulmonary findings. 2. Stable mild cardiomegaly. Electronically Signed   By: Hart Robinsons M.D.   On: 02/12/2024 09:36   (Echo, Carotid, EGD, Colonoscopy, ERCP)    Subjective: Pt c/o generalized weakness   Discharge Exam: Vitals:   02/18/24 0236 02/18/24 0817  BP: (!) 146/88 (!) 172/79  Pulse: 66 67  Resp: 17 (!) 25  Temp: 97.6 F (36.4 C) 97.9 F (36.6 C)  SpO2: 95% 98%   Vitals:   02/17/24 1611 02/17/24 2006 02/18/24 0236 02/18/24 0817  BP:  (!) 120/54 (!) 146/88 (!) 172/79  Pulse:  68 66 67  Resp:  20 17 (!) 25  Temp:  97.9 F (36.6 C) 97.6 F (36.4 C) 97.9 F (36.6 C)  TempSrc:  Oral Oral Oral  SpO2: 97% 96% 95% 98%  Weight:      Height:        General: Pt is alert, awake, not in acute distress Cardiovascular: S1/S2 +, no rubs, no gallops Respiratory: decreased breath sounds b/l  Abdominal: Soft, NT obese,, bowel sounds + Extremities:no cyanosis    The results of significant diagnostics from this hospitalization (including imaging, microbiology, ancillary and laboratory) are listed below for reference.     Microbiology: Recent Results (from  the past 240 hours)  Resp panel by RT-PCR (RSV, Flu A&B, Covid) Anterior Nasal Swab     Status: Abnormal   Collection Time: 02/12/24 10:15 AM   Specimen: Anterior Nasal Swab  Result Value Ref Range Status   SARS Coronavirus 2 by RT PCR NEGATIVE NEGATIVE Final    Comment: (NOTE) SARS-CoV-2 target nucleic acids are NOT DETECTED.  The SARS-CoV-2 RNA is generally detectable in upper respiratory specimens during the acute phase of infection. The lowest concentration of SARS-CoV-2 viral copies this assay can detect is 138 copies/mL. A negative result does not preclude SARS-Cov-2 infection and should not be used as the sole basis for treatment or other patient management decisions. A negative result may occur with  improper specimen collection/handling, submission of specimen other than nasopharyngeal swab, presence of viral mutation(s) within the areas targeted by this assay, and inadequate number of viral copies(<138 copies/mL). A negative result must be combined with clinical observations, patient history, and epidemiological information. The expected result is Negative.  Fact Sheet for Patients:  BloggerCourse.com  Fact Sheet for Healthcare Providers:  SeriousBroker.it  This test is no t yet approved or cleared by the [place] FDA and  has been authorized for detection and/or diagnosis of SARS-CoV-2 by FDA under an Emergency Use Authorization (EUA). This EUA will remain  in effect (meaning this test can be used) for the duration of the COVID-19 declaration under Section 564(b)(1) of the Act, 21 U.S.C.section 360bbb-3(b)(1), unless the authorization is terminated  or revoked sooner.       Influenza A by PCR POSITIVE (A) NEGATIVE Final   Influenza B by PCR NEGATIVE NEGATIVE Final    Comment: (NOTE) The Xpert Xpress SARS-CoV-2/FLU/RSV plus assay is intended as an aid in the diagnosis of influenza from Nasopharyngeal swab  specimens and should not be used as a sole basis for treatment. Nasal washings and aspirates are unacceptable for Xpert Xpress SARS-CoV-2/FLU/RSV testing.  Fact Sheet for Patients: BloggerCourse.com  Fact Sheet for Healthcare Providers: SeriousBroker.it  This test is not yet approved or cleared by the [place] FDA and has been authorized for detection and/or diagnosis of SARS-CoV-2 by FDA under an Emergency Use Authorization (EUA). This EUA will remain in effect (meaning  this test can be used) for the duration of the COVID-19 declaration under Section 564(b)(1) of the Act, 21 U.S.C. section 360bbb-3(b)(1), unless the authorization is terminated or revoked.     Resp Syncytial Virus by PCR NEGATIVE NEGATIVE Final    Comment: (NOTE) Fact Sheet for Patients: BloggerCourse.com  Fact Sheet for Healthcare Providers: SeriousBroker.it  This test is not yet approved or cleared by the [place] FDA and has been authorized for detection and/or diagnosis of SARS-CoV-2 by FDA under an Emergency Use Authorization (EUA). This EUA will remain in effect (meaning this test can be used) for the duration of the COVID-19 declaration under Section 564(b)(1) of the Act, 21 U.S.C. section 360bbb-3(b)(1), unless the authorization is terminated or revoked.  Performed at [facility], [address] [place], [place] [address]      Labs: BNP (last 3 results) Recent Labs    02/13/24 0329  BNP 239.9*   Basic Metabolic Panel: Recent Labs  Lab 02/12/24 0902 02/13/24 0329 02/14/24 0627  NA 140 139 140  K 3.8 3.7 4.0  CL 108 104 105  CO2 25 24 27   GLUCOSE 133* 163* 133*  BUN 22 20 26*  CREATININE 0.95 0.92 0.98  CALCIUM 8.9 8.5* 8.5*   Liver Function Tests: Recent Labs  Lab 02/14/24 0627  AST 24  ALT 11  ALKPHOS 85  BILITOT 0.6  PROT 5.8*  ALBUMIN 3.0*   No  results for input(s): "LIPASE", "AMYLASE" in the last 168 hours. No results for input(s): "AMMONIA" in the last 168 hours. CBC: Recent Labs  Lab 02/12/24 0902 02/13/24 0329 02/14/24 0627  WBC 6.5 10.5 9.6  NEUTROABS 4.2 7.6  --   HGB 12.6 12.6 11.6*  HCT 38.4 38.3 34.1*  MCV 104.3* 104.9* 101.2*  PLT 171 165 155   Cardiac Enzymes: No results for input(s): "CKTOTAL", "CKMB", "CKMBINDEX", "TROPONINI" in the last 168 hours. BNP: Invalid input(s): "POCBNP" CBG: Recent Labs  Lab 02/13/24 1208 02/13/24 1621 02/14/24 0740 02/14/24 1126  GLUCAP 241* 243* 120* 110*   D-Dimer No results for input(s): "DDIMER" in the last 72 hours. Hgb A1c No results for input(s): "HGBA1C" in the last 72 hours. Lipid Profile No results for input(s): "CHOL", "HDL", "LDLCALC", "TRIG", "CHOLHDL", "LDLDIRECT" in the last 72 hours. Thyroid function studies No results for input(s): "TSH", "T4TOTAL", "T3FREE", "THYROIDAB" in the last 72 hours.  Invalid input(s): "FREET3" Anemia work up No results for input(s): "VITAMINB12", "FOLATE", "FERRITIN", "TIBC", "IRON", "RETICCTPCT" in the last 72 hours. Urinalysis    Component Value Date/Time   COLORURINE AMBER (A) 08/27/2022 2206   APPEARANCEUR TURBID (A) 08/27/2022 2206   LABSPEC 1.029 08/27/2022 2206   PHURINE 5.0 08/27/2022 2206   GLUCOSEU NEGATIVE 08/27/2022 2206   HGBUR MODERATE (A) 08/27/2022 2206   BILIRUBINUR NEGATIVE 08/27/2022 2206   KETONESUR NEGATIVE 08/27/2022 2206   PROTEINUR 30 (A) 08/27/2022 2206   NITRITE NEGATIVE 08/27/2022 2206   LEUKOCYTESUR LARGE (A) 08/27/2022 2206   Sepsis Labs Recent Labs  Lab 02/12/24 0902 02/13/24 0329 02/14/24 0627  WBC 6.5 10.5 9.6   Microbiology Recent Results (from the past 240 hours)  Resp panel by RT-PCR (RSV, Flu A&B, Covid) Anterior Nasal Swab     Status: Abnormal   Collection Time: 02/12/24 10:15 AM   Specimen: Anterior Nasal Swab  Result Value Ref Range Status   SARS Coronavirus 2 by RT  PCR NEGATIVE NEGATIVE Final    Comment: (NOTE) SARS-CoV-2 target nucleic acids are NOT DETECTED.  The  SARS-CoV-2 RNA is generally detectable in upper respiratory specimens during the acute phase of infection. The lowest concentration of SARS-CoV-2 viral copies this assay can detect is 138 copies/mL. A negative result does not preclude SARS-Cov-2 infection and should not be used as the sole basis for treatment or other patient management decisions. A negative result may occur with  improper specimen collection/handling, submission of specimen other than nasopharyngeal swab, presence of viral mutation(s) within the areas targeted by this assay, and inadequate number of viral copies(<138 copies/mL). A negative result must be combined with clinical observations, patient history, and epidemiological information. The expected result is Negative.  Fact Sheet for Patients:  BloggerCourse.com  Fact Sheet for Healthcare Providers:  SeriousBroker.it  This test is no t yet approved or cleared by the [place] FDA and  has been authorized for detection and/or diagnosis of SARS-CoV-2 by FDA under an Emergency Use Authorization (EUA). This EUA will remain  in effect (meaning this test can be used) for the duration of the COVID-19 declaration under Section 564(b)(1) of the Act, 21 U.S.C.section 360bbb-3(b)(1), unless the authorization is terminated  or revoked sooner.       Influenza A by PCR POSITIVE (A) NEGATIVE Final   Influenza B by PCR NEGATIVE NEGATIVE Final    Comment: (NOTE) The Xpert Xpress SARS-CoV-2/FLU/RSV plus assay is intended as an aid in the diagnosis of influenza from Nasopharyngeal swab specimens and should not be used as a sole basis for treatment. Nasal washings and aspirates are unacceptable for Xpert Xpress SARS-CoV-2/FLU/RSV testing.  Fact Sheet for Patients: BloggerCourse.com  Fact Sheet  for Healthcare Providers: SeriousBroker.it  This test is not yet approved or cleared by the [place] FDA and has been authorized for detection and/or diagnosis of SARS-CoV-2 by FDA under an Emergency Use Authorization (EUA). This EUA will remain in effect (meaning this test can be used) for the duration of the COVID-19 declaration under Section 564(b)(1) of the Act, 21 U.S.C. section 360bbb-3(b)(1), unless the authorization is terminated or revoked.     Resp Syncytial Virus by PCR NEGATIVE NEGATIVE Final    Comment: (NOTE) Fact Sheet for Patients: BloggerCourse.com  Fact Sheet for Healthcare Providers: SeriousBroker.it  This test is not yet approved or cleared by the [place] FDA and has been authorized for detection and/or diagnosis of SARS-CoV-2 by FDA under an Emergency Use Authorization (EUA). This EUA will remain in effect (meaning this test can be used) for the duration of the COVID-19 declaration under Section 564(b)(1) of the Act, 21 U.S.C. section 360bbb-3(b)(1), unless the authorization is terminated or revoked.  Performed at [facility], [address] [place], [place] [address]      Time coordinating discharge: Over 30 minutes  SIGNED:   Charise Killian, MD  [facility] 02/18/2024, 11:36 AM Pager   If 7PM-7AM, please contact night-coverage [email or web address]

## 2024-02-19 ENCOUNTER — Other Ambulatory Visit: Payer: Self-pay

## 2024-03-06 ENCOUNTER — Other Ambulatory Visit: Payer: Self-pay

## 2024-03-06 ENCOUNTER — Observation Stay: Admit: 2024-03-06

## 2024-03-06 ENCOUNTER — Emergency Department

## 2024-03-06 ENCOUNTER — Inpatient Hospital Stay
Admission: EM | Admit: 2024-03-06 | Discharge: 2024-03-11 | DRG: 291 | Disposition: A | Discharge status: Home Health | Attending: Internal Medicine | Admitting: Internal Medicine

## 2024-03-06 DIAGNOSIS — J9601 Acute respiratory failure with hypoxia: Principal | ICD-10-CM | POA: Diagnosis present

## 2024-03-06 DIAGNOSIS — I5033 Acute on chronic diastolic (congestive) heart failure: Secondary | ICD-10-CM | POA: Diagnosis present

## 2024-03-06 DIAGNOSIS — Z885 Allergy status to narcotic agent status: Secondary | ICD-10-CM

## 2024-03-06 DIAGNOSIS — Z882 Allergy status to sulfonamides status: Secondary | ICD-10-CM

## 2024-03-06 DIAGNOSIS — I5031 Acute diastolic (congestive) heart failure: Secondary | ICD-10-CM | POA: Diagnosis present

## 2024-03-06 DIAGNOSIS — Z96643 Presence of artificial hip joint, bilateral: Secondary | ICD-10-CM | POA: Diagnosis present

## 2024-03-06 DIAGNOSIS — Z8249 Family history of ischemic heart disease and other diseases of the circulatory system: Secondary | ICD-10-CM

## 2024-03-06 DIAGNOSIS — Z888 Allergy status to other drugs, medicaments and biological substances status: Secondary | ICD-10-CM

## 2024-03-06 DIAGNOSIS — R54 Age-related physical debility: Secondary | ICD-10-CM | POA: Diagnosis present

## 2024-03-06 DIAGNOSIS — Z96651 Presence of right artificial knee joint: Secondary | ICD-10-CM | POA: Diagnosis present

## 2024-03-06 DIAGNOSIS — J9621 Acute and chronic respiratory failure with hypoxia: Secondary | ICD-10-CM | POA: Diagnosis present

## 2024-03-06 DIAGNOSIS — Z955 Presence of coronary angioplasty implant and graft: Secondary | ICD-10-CM

## 2024-03-06 DIAGNOSIS — I739 Peripheral vascular disease, unspecified: Secondary | ICD-10-CM | POA: Diagnosis present

## 2024-03-06 DIAGNOSIS — H9190 Unspecified hearing loss, unspecified ear: Secondary | ICD-10-CM | POA: Diagnosis present

## 2024-03-06 DIAGNOSIS — Z90710 Acquired absence of both cervix and uterus: Secondary | ICD-10-CM

## 2024-03-06 DIAGNOSIS — I2489 Other forms of acute ischemic heart disease: Secondary | ICD-10-CM | POA: Diagnosis present

## 2024-03-06 DIAGNOSIS — Z66 Do not resuscitate: Secondary | ICD-10-CM | POA: Diagnosis present

## 2024-03-06 DIAGNOSIS — I161 Hypertensive emergency: Secondary | ICD-10-CM | POA: Diagnosis present

## 2024-03-06 DIAGNOSIS — D72829 Elevated white blood cell count, unspecified: Secondary | ICD-10-CM

## 2024-03-06 DIAGNOSIS — I509 Heart failure, unspecified: Secondary | ICD-10-CM | POA: Diagnosis not present

## 2024-03-06 DIAGNOSIS — E785 Hyperlipidemia, unspecified: Secondary | ICD-10-CM | POA: Diagnosis present

## 2024-03-06 DIAGNOSIS — K219 Gastro-esophageal reflux disease without esophagitis: Secondary | ICD-10-CM | POA: Diagnosis present

## 2024-03-06 DIAGNOSIS — I4510 Unspecified right bundle-branch block: Secondary | ICD-10-CM | POA: Diagnosis present

## 2024-03-06 DIAGNOSIS — I110 Hypertensive heart disease with heart failure: Secondary | ICD-10-CM | POA: Diagnosis not present

## 2024-03-06 DIAGNOSIS — G4733 Obstructive sleep apnea (adult) (pediatric): Secondary | ICD-10-CM | POA: Diagnosis present

## 2024-03-06 DIAGNOSIS — Z79899 Other long term (current) drug therapy: Secondary | ICD-10-CM

## 2024-03-06 DIAGNOSIS — G20A1 Parkinson's disease without dyskinesia, without mention of fluctuations: Secondary | ICD-10-CM | POA: Diagnosis present

## 2024-03-06 DIAGNOSIS — I10 Essential (primary) hypertension: Secondary | ICD-10-CM | POA: Diagnosis present

## 2024-03-06 DIAGNOSIS — I2510 Atherosclerotic heart disease of native coronary artery without angina pectoris: Secondary | ICD-10-CM | POA: Diagnosis present

## 2024-03-06 DIAGNOSIS — D7589 Other specified diseases of blood and blood-forming organs: Secondary | ICD-10-CM | POA: Diagnosis present

## 2024-03-06 DIAGNOSIS — G629 Polyneuropathy, unspecified: Secondary | ICD-10-CM | POA: Diagnosis present

## 2024-03-06 LAB — COMPREHENSIVE METABOLIC PANEL
ALT: 6 U/L (ref 0–44)
AST: 26 U/L (ref 15–41)
Albumin: 3.3 g/dL — ABNORMAL LOW (ref 3.5–5.0)
Alkaline Phosphatase: 87 U/L (ref 38–126)
Anion gap: 6 (ref 5–15)
BUN: 15 mg/dL (ref 8–23)
CO2: 25 mmol/L (ref 22–32)
Calcium: 8.4 mg/dL — ABNORMAL LOW (ref 8.9–10.3)
Chloride: 107 mmol/L (ref 98–111)
Creatinine, Ser: 0.96 mg/dL (ref 0.44–1.00)
GFR, Estimated: 54 mL/min — ABNORMAL LOW (ref >60)
Glucose, Bld: 228 mg/dL — ABNORMAL HIGH (ref 70–99)
Potassium: 3.6 mmol/L (ref 3.5–5.1)
Sodium: 138 mmol/L (ref 135–145)
Total Bilirubin: 1.1 mg/dL (ref 0.0–1.2)
Total Protein: 6.8 g/dL (ref 6.5–8.1)

## 2024-03-06 LAB — BLOOD GAS, VENOUS
Acid-Base Excess: 0.5 mmol/L (ref 0.0–2.0)
Bicarbonate: 28.7 mmol/L — ABNORMAL HIGH (ref 20.0–28.0)
FIO2: 50 %
O2 Content: 40 L/min
O2 Saturation: 82.8 %
Patient temperature: 37
pCO2, Ven: 61 mmHg — ABNORMAL HIGH (ref 44–60)
pH, Ven: 7.28 (ref 7.25–7.43)
pO2, Ven: 56 mmHg — ABNORMAL HIGH (ref 32–45)

## 2024-03-06 LAB — CBC WITH DIFFERENTIAL/PLATELET
Abs Immature Granulocytes: 0.06 10*3/uL (ref 0.00–0.07)
Basophils Absolute: 0 10*3/uL (ref 0.0–0.1)
Basophils Relative: 0 %
Eosinophils Absolute: 0.3 10*3/uL (ref 0.0–0.5)
Eosinophils Relative: 2 %
HCT: 38 % (ref 36.0–46.0)
Hemoglobin: 12.6 g/dL (ref 12.0–15.0)
Immature Granulocytes: 0 %
Lymphocytes Relative: 22 %
Lymphs Abs: 3.4 10*3/uL (ref 0.7–4.0)
MCH: 34.7 pg — ABNORMAL HIGH (ref 26.0–34.0)
MCHC: 33.2 g/dL (ref 30.0–36.0)
MCV: 104.7 fL — ABNORMAL HIGH (ref 80.0–100.0)
Monocytes Absolute: 0.7 10*3/uL (ref 0.1–1.0)
Monocytes Relative: 5 %
Neutro Abs: 10.9 10*3/uL — ABNORMAL HIGH (ref 1.7–7.7)
Neutrophils Relative %: 71 %
Platelets: 222 10*3/uL (ref 150–400)
RBC: 3.63 MIL/uL — ABNORMAL LOW (ref 3.87–5.11)
RDW: 14.4 % (ref 11.5–15.5)
WBC: 15.3 10*3/uL — ABNORMAL HIGH (ref 4.0–10.5)
nRBC: 0 % (ref 0.0–0.2)

## 2024-03-06 LAB — TROPONIN I (HIGH SENSITIVITY)
Troponin I (High Sensitivity): 67 ng/L — ABNORMAL HIGH (ref <18)
Troponin I (High Sensitivity): 334 ng/L (ref <18)
Troponin I (High Sensitivity): 285 ng/L (ref <18)

## 2024-03-06 LAB — BRAIN NATRIURETIC PEPTIDE: B Natriuretic Peptide: 358.1 pg/mL — ABNORMAL HIGH (ref 0.0–100.0)

## 2024-03-06 MED ORDER — LATANOPROST 0.005 % OP SOLN
1.0000 [drp] | Freq: Every day | OPHTHALMIC | Status: DC
  Administered 2024-03-06 – 2024-03-10 (×4): 1 [drp] via OPHTHALMIC
  Filled 2024-03-06 (×4): qty 2.5

## 2024-03-06 MED ORDER — CARBIDOPA-LEVODOPA 25-100 MG PO TABS
1.0000 | ORAL_TABLET | Freq: Every day | ORAL | Status: DC
  Administered 2024-03-06 – 2024-03-10 (×5): 1 via ORAL
  Filled 2024-03-06 (×7): qty 1

## 2024-03-06 MED ORDER — SODIUM CHLORIDE 0.9 % IV SOLN: 250.0000 mL | INTRAVENOUS | Status: AC | PRN

## 2024-03-06 MED ORDER — ONDANSETRON HCL 4 MG/2ML IJ SOLN
4.0000 mg | Freq: Once | INTRAMUSCULAR | Status: AC
  Administered 2024-03-06: 4 mg via INTRAVENOUS
  Filled 2024-03-06: qty 2

## 2024-03-06 MED ORDER — FUROSEMIDE 10 MG/ML IJ SOLN
40.0000 mg | Freq: Once | INTRAMUSCULAR | Status: AC
  Administered 2024-03-06: 40 mg via INTRAVENOUS
  Filled 2024-03-06: qty 4

## 2024-03-06 MED ORDER — SODIUM CHLORIDE 0.9% FLUSH
3.0000 mL | Freq: Two times a day (BID) | INTRAVENOUS | Status: DC
  Administered 2024-03-06 – 2024-03-11 (×11): 3 mL via INTRAVENOUS

## 2024-03-06 MED ORDER — FUROSEMIDE 10 MG/ML IJ SOLN
40.0000 mg | Freq: Two times a day (BID) | INTRAMUSCULAR | Status: DC
  Administered 2024-03-06 – 2024-03-07 (×3): 40 mg via INTRAVENOUS
  Filled 2024-03-06 (×4): qty 4

## 2024-03-06 MED ORDER — METOPROLOL SUCCINATE ER 50 MG PO TB24
100.0000 mg | ORAL_TABLET | Freq: Every day | ORAL | Status: DC
  Administered 2024-03-06 – 2024-03-11 (×6): 100 mg via ORAL
  Filled 2024-03-06 (×3): qty 2
  Filled 2024-03-06 (×2): qty 1
  Filled 2024-03-06: qty 2

## 2024-03-06 MED ORDER — LABETALOL HCL 5 MG/ML IV SOLN
5.0000 mg | Freq: Once | INTRAVENOUS | Status: AC
  Administered 2024-03-06: 5 mg via INTRAVENOUS
  Filled 2024-03-06: qty 4

## 2024-03-06 MED ORDER — GUAIFENESIN ER 600 MG PO TB12
1200.0000 mg | ORAL_TABLET | Freq: Two times a day (BID) | ORAL | Status: DC
  Administered 2024-03-06 – 2024-03-11 (×11): 1200 mg via ORAL
  Filled 2024-03-06 (×13): qty 2

## 2024-03-06 MED ORDER — ACETAMINOPHEN 325 MG PO TABS
650.0000 mg | ORAL_TABLET | ORAL | Status: DC | PRN
  Administered 2024-03-10: 650 mg via ORAL
  Filled 2024-03-06: qty 2

## 2024-03-06 MED ORDER — ONDANSETRON HCL 4 MG/2ML IJ SOLN: 4.0000 mg | Freq: Four times a day (QID) | INTRAMUSCULAR | Status: DC | PRN

## 2024-03-06 MED ORDER — ESCITALOPRAM OXALATE 10 MG PO TABS
5.0000 mg | ORAL_TABLET | Freq: Every day | ORAL | Status: DC
  Administered 2024-03-06 – 2024-03-11 (×6): 5 mg via ORAL
  Filled 2024-03-06 (×6): qty 1

## 2024-03-06 MED ORDER — SODIUM CHLORIDE 0.9% FLUSH: 3.0000 mL | INTRAVENOUS | Status: DC | PRN

## 2024-03-06 MED ORDER — LISINOPRIL 20 MG PO TABS
20.0000 mg | ORAL_TABLET | Freq: Every day | ORAL | Status: DC
  Administered 2024-03-06 – 2024-03-11 (×6): 20 mg via ORAL
  Filled 2024-03-06: qty 1
  Filled 2024-03-06 (×2): qty 2
  Filled 2024-03-06 (×3): qty 1

## 2024-03-06 MED ORDER — HEPARIN SODIUM (PORCINE) 5000 UNIT/ML IJ SOLN
5000.0000 [IU] | Freq: Two times a day (BID) | INTRAMUSCULAR | Status: DC
  Administered 2024-03-06 – 2024-03-11 (×11): 5000 [IU] via SUBCUTANEOUS
  Filled 2024-03-06 (×11): qty 1

## 2024-03-06 MED ORDER — IPRATROPIUM-ALBUTEROL 0.5-2.5 (3) MG/3ML IN SOLN
3.0000 mL | Freq: Four times a day (QID) | RESPIRATORY_TRACT | Status: DC
  Administered 2024-03-06 – 2024-03-08 (×8): 3 mL via RESPIRATORY_TRACT
  Filled 2024-03-06 (×9): qty 3

## 2024-03-06 NOTE — H&P (Signed)
 History and Physical    Sheila Long UJW:[id number] DOB: 1924/08/04 DOA: 03/06/2024  PCP: Enid Baas, MD (Confirm with patient/family/NH records and if not entered, this has to be entered at [facility] point of entry) Patient coming from: Home  I have personally briefly reviewed patient's old medical records in [facility]  Chief Complaint: Cough, SOB.  HPI: Sheila Long is a 89 y.o. female with medical history significant of Parkinson's disease, chronic HFpEF, CAD, GERD, neuropathy, sarcoidosis, chronic ambulatory impairment, presented with worsening of cough and shortness of breath.  Patient was recently hospitalized for influenza A pneumonia and stabilized and discharged home.  This time, patient started to develop productive cough with clear phlegm about 3 to 4 days ago, gradually getting worse and started develop increasing exertional dyspnea.  Denied any chest pain no fever or chills.  Last night, patient woke up with severe shortness of breath and called family.  EMS arrived and found patient blood pressure SBP more than 200.  Patient reported that she has been compliant with her BP medications and Lasix.  Family reported that the patient urine has been darker recently and patient report good p.o. intake.  ED Course: Afebrile, none tachycardia blood pressure 180/97 and the patient was placed on BiPAP and blood pressure improved 140/90.  Chest x-ray showed bilateral pulmonary congestion, blood work showed creatinine 0.5, K3.6, WBC 15.3, hemoglobin 12.6, VBG 7.28/61/56.  Patient was given IV Lasix 40 mg x 1 in the ED and labetalol times 1 in the evening blood pressure improved to 140/90  Review of Systems: As per HPI otherwise 14 point review of systems negative.    Past Medical History:  Diagnosis Date   Allergic rhinitis due to pollen    Anxiety    Aortic atherosclerosis (HCC)    CHF (congestive heart failure) (HCC)    Coronary artery disease    Stent in  2000's (DC)   Dyspnea    GERD (gastroesophageal reflux disease)    Hard of hearing    Hypertension    Neuropathy    Obstructive sleep apnea    Osteoarthritis of both knees    Peripheral vascular disease (HCC)    Sarcoidosis    Thyroid nodule     Past Surgical History:  Procedure Laterality Date   ABDOMINAL HYSTERECTOMY     BIOPSY THYROID  2014   Dr Andee Poles   BREAST EXCISIONAL BIOPSY Left 1980s   surgical bx neg   BREAST EXCISIONAL BIOPSY Left 1980s   benign   CARDIOVASCULAR STRESS TEST  05/12/2012   with dobutamine---negative. Normal LV function   CORONARY ANGIOPLASTY  2006   ESOPHAGOGASTRODUODENOSCOPY (EGD) WITH PROPOFOL N/A 04/03/2020   Procedure: ESOPHAGOGASTRODUODENOSCOPY (EGD) WITH PROPOFOL;  Surgeon: [name], MD;  Location: [facility] ENDOSCOPY;  Service: Gastroenterology;  Laterality: N/A;   JOINT REPLACEMENT Left 2000   hip   JOINT REPLACEMENT Right 2007   hip   KNEE ARTHROPLASTY Right 06/23/2017   Procedure: COMPUTER ASSISTED TOTAL KNEE ARTHROPLASTY;  Surgeon: Donato Heinz, MD;  Location: [facility] ORS;  Service: Orthopedics;  Laterality: Right;     reports that she has never smoked. She has never used smokeless tobacco. She reports that she does not drink alcohol and does not use drugs.  Allergies  Allergen Reactions   Tramadol Other (See Comments)    CONFUSION   Sulfa Antibiotics     Patient does not recall   Zocor [Simvastatin] Other (See Comments)    INTOLERANCE-MYALGIAS     Family History  Problem Relation Age of Onset   Cancer Sister 32       unknown - groin area   Heart attack Brother    Cancer Sister 21       pancreatic    Heart attack Sister    Heart attack Brother     Prior to Admission medications   Medication Sig Start Date End Date Taking? Authorizing Provider  carbidopa-levodopa (SINEMET IR) 25-100 MG tablet Take 1 tablet by mouth at bedtime for 7 days. 02/18/24 04/22/24 Yes Charise Killian, MD  escitalopram (LEXAPRO) 5 MG tablet Take 1  tablet (5 mg total) by mouth daily for 7 days. 02/18/24 04/22/25 Yes Charise Killian, MD  ferrous sulfate 325 (65 FE) MG tablet Take 1 tablet (325 mg total) by mouth 2 (two) times daily for 7 days. 02/18/24 04/22/25 Yes Charise Killian, MD  furosemide (LASIX) 20 MG tablet Take 1 tablet (20 mg total) by mouth every other day for 7 days. 02/18/24 04/22/25 Yes Charise Killian, MD  glycopyrrolate (ROBINUL) 1 MG tablet Take 0.5 mg by mouth daily as needed.   Yes [provider]  latanoprost (XALATAN) 0.005 % ophthalmic solution Place 1 drop into both eyes at bedtime for 7 days. 02/18/24 04/08/24 Yes Charise Killian, MD  lisinopril (ZESTRIL) 20 MG tablet Take 1 tablet (20 mg total) by mouth daily for 7 days. 02/19/24 04/22/25 Yes Charise Killian, MD  metoprolol succinate (TOPROL-XL) 100 MG 24 hr tablet Take 1 tablet (100 mg total) by mouth daily for 7 days. Take with or immediately following a meal. 02/19/24 04/22/25 Yes Charise Killian, MD  pantoprazole (PROTONIX) 40 MG tablet Take 1 tablet (40 mg total) by mouth 2 (two) times daily before a meal for 7 days. Patient not taking: Reported on 03/06/2024 02/18/24 04/22/25  Charise Killian, MD    Physical Exam: Vitals:   03/06/24 0807 03/06/24 0808 03/06/24 0814 03/06/24 0910  BP: (!) 181/97   (!) 139/102  Pulse: 89   72  Resp: (!) 31   (!) 26  Temp:   (!) 97.1 F (36.2 C)   TempSrc:   Axillary   SpO2: 98%   100%  Weight:  90.5 kg    Height:  5\' 4"  (1.626 m)      Constitutional: NAD, calm, comfortable Vitals:   03/06/24 0807 03/06/24 0808 03/06/24 0814 03/06/24 0910  BP: (!) 181/97   (!) 139/102  Pulse: 89   72  Resp: (!) 31   (!) 26  Temp:   (!) 97.1 F (36.2 C)   TempSrc:   Axillary   SpO2: 98%   100%  Weight:  90.5 kg    Height:  5\' 4"  (1.626 m)     Eyes: PERRL, lids and conjunctivae normal ENMT: Mucous membranes are moist. Posterior pharynx clear of any exudate or lesions.Normal dentition.  Neck: normal, supple,  no masses, no thyromegaly Respiratory: clear to auscultation bilaterally, no wheezing, fine crackles on bilateral lower fields, increasing respiratory effort. No accessory muscle use.  Cardiovascular: Regular rate and rhythm, no murmurs / rubs / gallops.  2+ extremity edema. 2+ pedal pulses. No carotid bruits.  Abdomen: no tenderness, no masses palpated. No hepatosplenomegaly. Bowel sounds positive.  Musculoskeletal: no clubbing / cyanosis. No joint deformity upper and lower extremities. Good ROM, no contractures. Normal muscle tone.  Skin: no rashes, lesions, ulcers. No induration Neurologic: CN 2-12 grossly intact. Sensation intact, DTR normal. Strength 5/5 in all 4.  Psychiatric: Normal judgment and insight. Alert and oriented x 3. Normal mood.     Labs on Admission: I have personally reviewed following labs and imaging studies  CBC: Recent Labs  Lab 03/06/24 0813  WBC 15.3*  NEUTROABS 10.9*  HGB 12.6  HCT 38.0  MCV 104.7*  PLT 222   Basic Metabolic Panel: Recent Labs  Lab 03/06/24 0813  NA 138  K 3.6  CL 107  CO2 25  GLUCOSE 228*  BUN 15  CREATININE 0.96  CALCIUM 8.4*   GFR: Estimated Creatinine Clearance: 36.5 mL/min (by C-G formula based on SCr of 0.96 mg/dL). Liver Function Tests: Recent Labs  Lab 03/06/24 0813  AST 26  ALT 6  ALKPHOS 87  BILITOT 1.1  PROT 6.8  ALBUMIN 3.3*   No results for input(s): "LIPASE", "AMYLASE" in the last 168 hours. No results for input(s): "AMMONIA" in the last 168 hours. Coagulation Profile: No results for input(s): "INR", "PROTIME" in the last 168 hours. Cardiac Enzymes: No results for input(s): "CKTOTAL", "CKMB", "CKMBINDEX", "TROPONINI" in the last 168 hours. BNP (last 3 results) No results for input(s): "PROBNP" in the last 8760 hours. HbA1C: No results for input(s): "HGBA1C" in the last 72 hours. CBG: No results for input(s): "GLUCAP" in the last 168 hours. Lipid Profile: No results for input(s): "CHOL", "HDL",  "LDLCALC", "TRIG", "CHOLHDL", "LDLDIRECT" in the last 72 hours. Thyroid Function Tests: No results for input(s): "TSH", "T4TOTAL", "FREET4", "T3FREE", "THYROIDAB" in the last 72 hours. Anemia Panel: No results for input(s): "VITAMINB12", "FOLATE", "FERRITIN", "TIBC", "IRON", "RETICCTPCT" in the last 72 hours. Urine analysis:    Component Value Date/Time   COLORURINE AMBER (A) 08/27/2022 2206   APPEARANCEUR TURBID (A) 08/27/2022 2206   LABSPEC 1.029 08/27/2022 2206   PHURINE 5.0 08/27/2022 2206   GLUCOSEU NEGATIVE 08/27/2022 2206   HGBUR MODERATE (A) 08/27/2022 2206   BILIRUBINUR NEGATIVE 08/27/2022 2206   KETONESUR NEGATIVE 08/27/2022 2206   PROTEINUR 30 (A) 08/27/2022 2206   NITRITE NEGATIVE 08/27/2022 2206   LEUKOCYTESUR LARGE (A) 08/27/2022 2206    Radiological Exams on Admission: DG Chest Portable 1 View Result Date: 03/06/2024 CLINICAL DATA:  resp distress EXAM: PORTABLE CHEST 1 VIEW COMPARISON:  February 12, 2024 FINDINGS: The cardiomediastinal silhouette is unchanged in contour. Small LEFT pleural effusion. Trace RIGHT pleural effusion. No pneumothorax. Increased peribronchial cuffing and bibasilar predominant interstitial opacities. LEFT retrocardiac heterogeneous airspace opacity. Atherosclerotic calcifications. Chronic appearing deformity of the RIGHT acromioclavicular joint. IMPRESSION: 1. Small LEFT and trace RIGHT pleural effusions. 2. Increased peribronchial cuffing and bibasilar predominant interstitial opacities. Differential considerations include pulmonary edema versus atypical infection. 3. LEFT retrocardiac heterogeneous airspace opacity could reflect atelectasis versus infection. Electronically Signed   By: Meda Klinefelter M.D.   On: 03/06/2024 08:31    EKG: Independently reviewed.  Sinus rhythm chronic RBBB, no acute ST changes.  Assessment/Plan Principal Problem:   CHF (congestive heart failure) (HCC) Active Problems:   Acute heart failure with preserved  ejection fraction (HCC)   Acute on chronic congestive heart failure (HCC)  (please populate well all problems here in Problem List. (For example, if patient is on BP meds at home and you resume or decide to hold them, it is a problem that needs to be her. Same for CAD, COPD, HLD and so on)  Acute hypoxic respiratory failure Acute on chronic HFpEF decompensation -Clinically patient has significant fluid overload, plan to continue IV Lasix twice daily -Echocardiogram -Other DDx, low suspicion for pneumonia, monitor off antibiotics.  No history of COPD, normal lung function test in 2013  HTN emergency -Responded to topical -Resume home BP meds including lisinopril and metoprolol  Leukocytosis -Monitor off antibiotics  CAD -No chest pain.  Troponin negative x 2 and EKG showed chronic RBBB no acute ST changes.  ACS ruled out COPD versus tackiness disease  Parkinson's disease -Stable, continue Sinemet   DVT prophylaxis: Heparin subcu Code Status: DNR Family Communication: Home Disposition Plan: Expect less than 2 midnight hospital stay Consults called: None Admission status: Telemetry observation   Emeline General MD [facility] Pager [phone]  03/06/2024, 10:14 AM

## 2024-03-06 NOTE — ED Notes (Signed)
 Pt set up for breakfast by this RN. CB within reach.

## 2024-03-06 NOTE — ED Notes (Signed)
 Pt set up for dinner by this RN. CB within reach

## 2024-03-06 NOTE — ED Notes (Signed)
 Hospitalist at bedside

## 2024-03-06 NOTE — ED Provider Notes (Signed)
 [facility] Provider Note    Event Date/Time   First MD Initiated Contact with Patient 03/06/24 [phone]     (approximate)   History   [name]   HPI  Sheila Long is a 89 y.o. female  who presents to the ER for evaluation of respiratory distress.  Patient recently admitted for hypoxemia secondary to the flu.  Does have a history of CHF.  No documented history of asthma or COPD.  Was found to be hypoxic to the 70s.  Reportedly has a CPAP at but has not been using it.  Patient was placed on CPAP brought to the ER.  Found to be significantly hypertensive was given Nitropaste.  Also reporting with wheezing with EMS therefore was given nebulizer treatment.  She arrives with a signed DNR.         Physical Exam   Triage Vital Signs: ED Triage Vitals  Encounter Vitals Group     BP      Systolic BP Percentile      Diastolic BP Percentile      Pulse      Resp      Temp      Temp src      SpO2      Weight      Height      Head Circumference      Peak Flow      Pain Score      Pain Loc      Pain Education      Exclude from Growth Chart     Most recent vital signs: Vitals:   03/06/24 0807 03/06/24 0814  BP: (!) 181/97   Pulse: 89   Resp: (!) 31   Temp:  (!) 97.1 F (36.2 C)  SpO2: 98%      Constitutional: Alert  Eyes: Conjunctivae are normal.  Head: Atraumatic. Nose: No congestion/rhinnorhea. Mouth/Throat: Mucous membranes are moist.   Neck: Painless ROM.  Cardiovascular:   Good peripheral circulation. Respiratory: Increased work of breathing, tachypnea, diminished posterior breath sounds.  Scattered wheeze. Gastrointestinal: Soft and nontender.  Musculoskeletal:  no deformity Neurologic:  MAE spontaneously. No gross focal neurologic deficits are appreciated.  Skin:  Skin is warm, dry and intact. No rash noted. Psychiatric: Mood and affect are normal. Speech and behavior are normal.    ED Results / Procedures /  Treatments   Labs (all labs ordered are listed, but only abnormal results are displayed) Labs Reviewed  CBC WITH DIFFERENTIAL/PLATELET - Abnormal; Notable for the following components:      Result Value   WBC 15.3 (*)    RBC 3.63 (*)    MCV 104.7 (*)    MCH 34.7 (*)    Neutro Abs 10.9 (*)    All other components within normal limits  COMPREHENSIVE METABOLIC PANEL - Abnormal; Notable for the following components:   Glucose, Bld 228 (*)    Calcium 8.4 (*)    Albumin 3.3 (*)    GFR, Estimated 54 (*)    All other components within normal limits  BLOOD GAS, VENOUS - Abnormal; Notable for the following components:   pCO2, Ven 61 (*)    pO2, Ven 56 (*)    Bicarbonate 28.7 (*)    All other components within normal limits  TROPONIN I (HIGH SENSITIVITY) - Abnormal; Notable for the following components:   Troponin I (High Sensitivity) 67 (*)    All other components within normal limits  BRAIN  NATRIURETIC PEPTIDE     EKG  ED ECG REPORT I, [name], the attending physician, personally viewed and interpreted this ECG.   Date: 03/06/2024  EKG Time: 8:15  Rate: 85  Rhythm: sinus  Axis: normal  Intervals: rbbb  ST&T Change: no stemi, no depressions    RADIOLOGY Please see ED Course for my review and interpretation.  I personally reviewed all radiographic images ordered to evaluate for the above acute complaints and reviewed radiology reports and findings.  These findings were personally discussed with the patient.  Please see medical record for radiology report.    PROCEDURES:  Critical Care performed: yes  .Critical Care  Performed by: [name], MD Authorized by: [name], MD   Critical care provider statement:    Critical care time (minutes):  40   Critical care was necessary to treat or prevent imminent or life-threatening deterioration of the following conditions:  Respiratory failure   Critical care was time spent personally by me on the  following activities:  Ordering and performing treatments and interventions, ordering and review of laboratory studies, ordering and review of radiographic studies, pulse oximetry, re-evaluation of patient's condition, review of old charts, obtaining history from patient or surrogate, examination of patient, evaluation of patient's response to treatment, discussions with primary provider, discussions with consultants and development of treatment plan with patient or surrogate    MEDICATIONS ORDERED IN ED: Medications  furosemide (LASIX) injection 40 mg (has no administration in time range)  ondansetron (ZOFRAN) injection 4 mg (4 mg Intravenous Given 03/06/24 0818)  labetalol (NORMODYNE) injection 5 mg (5 mg Intravenous Given 03/06/24 0817)     IMPRESSION / MDM / ASSESSMENT AND PLAN / ED COURSE  I reviewed the triage vital signs and the nursing notes.                              Differential diagnosis includes, but is not limited to, Asthma, copd, CHF, pna, ptx, malignancy, Pe, anemia  Patient presenting to the ER for evaluation of symptoms as described above.  Based on symptoms, risk factors and considered above differential, this presenting complaint could reflect a potentially life-threatening illness therefore the patient will be placed on continuous pulse oximetry and telemetry for monitoring.  Laboratory evaluation will be sent to evaluate for the above complaints.  Patient critically ill-appearing in respiratory distress.  She is on CPAP and was transitioned to BiPAP but complaining of some nausea, therefore will order IV Zofran and will trial heated high flow nasal cannula for respiratory support.    Clinical Course as of 03/06/24 0856  Sat Mar 06, 2024  0808 Patient is quite hypertensive.  She is already got nitro on board.  Will give IV labetalol. [PR]  0854 Chest x-ray on my review and interpretation concerning for pulmonary edema.  She has been more comfortable on heated high flow.   Troponin mildly elevated likely secondary demand ischemia in the setting of hypoxia.  She not febrile.  Mild leukocytosis also likely secondary to hypoxia.  Considered pneumonia but lack of fever and given her hypertension and findings concerning for Carte heart failure feel that CHF is more likely.  Blood pressures improved.  Will order Lasix. [PR]  [phone] Patient does appear stable and appropriate for admission to the hospital.  Will consult hospitalist for admission. [PR]    Clinical Course User Index [PR] [name], MD     FINAL CLINICAL IMPRESSION(S) /  ED DIAGNOSES   Final diagnoses:  Acute respiratory failure with hypoxia (HCC)     Rx / DC Orders   ED Discharge Orders     None        Note:  This document was prepared using Dragon voice recognition software and may include unintentional dictation errors.    [name], MD 03/06/24 [phone]

## 2024-03-06 NOTE — ED Notes (Signed)
 Neighbor at bedside. Pt gave verbal permission to this RN to discuss medical treatment and information with neighbor.

## 2024-03-06 NOTE — ED Notes (Signed)
 Pt spoke to neighbor, Kathyrn Sheriff [phone]).

## 2024-03-06 NOTE — ED Triage Notes (Signed)
 Pt BIB [facility] from home c/o SOB. Pt wears a CPAP at night and was recently hospitalized for the flu. 70% on RA, Hypotensive at 290/80 per EMS. Pt reports not wearing o2 at home. EMS gave  2 albuterol, 1 duoneb, 125 solumedrol, 1 inch Nitro and 2 mag.   AXO

## 2024-03-07 ENCOUNTER — Observation Stay

## 2024-03-07 DIAGNOSIS — I161 Hypertensive emergency: Secondary | ICD-10-CM | POA: Diagnosis present

## 2024-03-07 DIAGNOSIS — I10 Essential (primary) hypertension: Secondary | ICD-10-CM

## 2024-03-07 DIAGNOSIS — I2510 Atherosclerotic heart disease of native coronary artery without angina pectoris: Secondary | ICD-10-CM

## 2024-03-07 DIAGNOSIS — Z90710 Acquired absence of both cervix and uterus: Secondary | ICD-10-CM | POA: Diagnosis not present

## 2024-03-07 DIAGNOSIS — J9601 Acute respiratory failure with hypoxia: Secondary | ICD-10-CM

## 2024-03-07 DIAGNOSIS — Z96651 Presence of right artificial knee joint: Secondary | ICD-10-CM | POA: Diagnosis present

## 2024-03-07 DIAGNOSIS — G4733 Obstructive sleep apnea (adult) (pediatric): Secondary | ICD-10-CM | POA: Diagnosis present

## 2024-03-07 DIAGNOSIS — I5033 Acute on chronic diastolic (congestive) heart failure: Secondary | ICD-10-CM | POA: Diagnosis present

## 2024-03-07 DIAGNOSIS — H9190 Unspecified hearing loss, unspecified ear: Secondary | ICD-10-CM | POA: Diagnosis present

## 2024-03-07 DIAGNOSIS — Z79899 Other long term (current) drug therapy: Secondary | ICD-10-CM | POA: Diagnosis not present

## 2024-03-07 DIAGNOSIS — D72829 Elevated white blood cell count, unspecified: Secondary | ICD-10-CM

## 2024-03-07 DIAGNOSIS — E785 Hyperlipidemia, unspecified: Secondary | ICD-10-CM | POA: Diagnosis present

## 2024-03-07 DIAGNOSIS — Z888 Allergy status to other drugs, medicaments and biological substances status: Secondary | ICD-10-CM | POA: Diagnosis not present

## 2024-03-07 DIAGNOSIS — Z885 Allergy status to narcotic agent status: Secondary | ICD-10-CM | POA: Diagnosis not present

## 2024-03-07 DIAGNOSIS — I2489 Other forms of acute ischemic heart disease: Secondary | ICD-10-CM | POA: Diagnosis present

## 2024-03-07 DIAGNOSIS — G20A1 Parkinson's disease without dyskinesia, without mention of fluctuations: Secondary | ICD-10-CM | POA: Diagnosis present

## 2024-03-07 DIAGNOSIS — D7589 Other specified diseases of blood and blood-forming organs: Secondary | ICD-10-CM | POA: Diagnosis present

## 2024-03-07 DIAGNOSIS — I739 Peripheral vascular disease, unspecified: Secondary | ICD-10-CM | POA: Diagnosis present

## 2024-03-07 DIAGNOSIS — J9621 Acute and chronic respiratory failure with hypoxia: Secondary | ICD-10-CM | POA: Diagnosis present

## 2024-03-07 DIAGNOSIS — I4510 Unspecified right bundle-branch block: Secondary | ICD-10-CM | POA: Diagnosis present

## 2024-03-07 DIAGNOSIS — I509 Heart failure, unspecified: Secondary | ICD-10-CM | POA: Insufficient documentation

## 2024-03-07 DIAGNOSIS — Z66 Do not resuscitate: Secondary | ICD-10-CM | POA: Diagnosis present

## 2024-03-07 DIAGNOSIS — Z96643 Presence of artificial hip joint, bilateral: Secondary | ICD-10-CM | POA: Diagnosis present

## 2024-03-07 DIAGNOSIS — I110 Hypertensive heart disease with heart failure: Secondary | ICD-10-CM | POA: Diagnosis present

## 2024-03-07 DIAGNOSIS — Z882 Allergy status to sulfonamides status: Secondary | ICD-10-CM | POA: Diagnosis not present

## 2024-03-07 DIAGNOSIS — Z955 Presence of coronary angioplasty implant and graft: Secondary | ICD-10-CM | POA: Diagnosis not present

## 2024-03-07 DIAGNOSIS — Z8249 Family history of ischemic heart disease and other diseases of the circulatory system: Secondary | ICD-10-CM | POA: Diagnosis not present

## 2024-03-07 LAB — CBC
HCT: 35 % — ABNORMAL LOW (ref 36.0–46.0)
Hemoglobin: 11.5 g/dL — ABNORMAL LOW (ref 12.0–15.0)
MCH: 34.5 pg — ABNORMAL HIGH (ref 26.0–34.0)
MCHC: 32.9 g/dL (ref 30.0–36.0)
MCV: 105.1 fL — ABNORMAL HIGH (ref 80.0–100.0)
Platelets: 204 10*3/uL (ref 150–400)
RBC: 3.33 MIL/uL — ABNORMAL LOW (ref 3.87–5.11)
RDW: 14.1 % (ref 11.5–15.5)
WBC: 9.3 10*3/uL (ref 4.0–10.5)
nRBC: 0 % (ref 0.0–0.2)

## 2024-03-07 LAB — RESP PANEL BY RT-PCR (RSV, FLU A&B, COVID)  RVPGX2
Influenza A by PCR: NEGATIVE
Influenza B by PCR: NEGATIVE
Resp Syncytial Virus by PCR: NEGATIVE
SARS Coronavirus 2 by RT PCR: NEGATIVE

## 2024-03-07 LAB — BASIC METABOLIC PANEL
Anion gap: 6 (ref 5–15)
BUN: 21 mg/dL (ref 8–23)
CO2: 29 mmol/L (ref 22–32)
Calcium: 8.4 mg/dL — ABNORMAL LOW (ref 8.9–10.3)
Chloride: 104 mmol/L (ref 98–111)
Creatinine, Ser: 0.99 mg/dL (ref 0.44–1.00)
GFR, Estimated: 52 mL/min — ABNORMAL LOW (ref >60)
Glucose, Bld: 131 mg/dL — ABNORMAL HIGH (ref 70–99)
Potassium: 4.5 mmol/L (ref 3.5–5.1)
Sodium: 139 mmol/L (ref 135–145)

## 2024-03-07 LAB — PROCALCITONIN: Procalcitonin: 0.16 ng/mL

## 2024-03-07 LAB — MRSA NEXT GEN BY PCR, NASAL: MRSA by PCR Next Gen: NOT DETECTED

## 2024-03-07 NOTE — Assessment & Plan Note (Signed)
 Likely reactive with acute hypoxic failure.  No concern of any infection at this time.  No need for antibiotics Improved. -Continue to monitor

## 2024-03-07 NOTE — Assessment & Plan Note (Signed)
 Likely due to acute on chronic HFpEF.  Elevated BNP and chest x-ray with concern of pulmonary edema, which seems improving on repeat chest x-ray this morning. Initially required BiPAP followed by heated high flow and now weaned to 4 L of oxygen.  No baseline oxygen use Patient has recent influenza A, MRSA PCR and repeat respiratory panel negative, less likely pneumonia. -Continue supplemental oxygen-wean as tolerated

## 2024-03-07 NOTE — TOC Initial Note (Signed)
 Transition of Care [facility]) - Initial/Assessment Note    Patient Details  Name: Sheila Long MRN: [id number] Date of Birth: 1924-04-28  [facility]) CM/SW Contact:    Liliana Cline, LCSW Phone Number: 03/07/2024, 1:08 PM  Clinical Narrative:                 Patient is known to [facility] for recent discharge to [facility] for STR. Per MD, patient should be medically ready to return to SNF in 1-2 days. CSW requested PT/OT evals. CSW left VM for Dabe at [facility] - awaiting response.  Expected Discharge Plan: Skilled Nursing Facility Barriers to Discharge: Continued Medical Work up   Patient Goals and CMS Choice            Expected Discharge Plan and Services                                              Prior Living Arrangements/Services                       Activities of Daily Living      Permission Sought/Granted                  Emotional Assessment              Admission diagnosis:  CHF (congestive heart failure) (HCC) [I50.9] Acute CHF (congestive heart failure) (HCC) [I50.9] Patient Active Problem List   Diagnosis Date Noted   Acute CHF (congestive heart failure) (HCC) 03/07/2024   Leukocytosis 03/07/2024   CHF (congestive heart failure) (HCC) 03/06/2024   Acute hypoxic respiratory failure (HCC) 02/13/2024   Fall 01/06/2023   Multiple fractures 01/06/2023   COVID-19 virus infection 08/28/2022   Essential hypertension 08/28/2022   Acute kidney injury superimposed on chronic kidney disease (HCC) 08/28/2022   GERD with esophagitis 08/28/2022   Generalized weakness    UTI (urinary tract infection) 08/27/2022   Acute on chronic congestive heart failure (HCC)    Hospice care    Acute heart failure with preserved ejection fraction (HCC) 06/02/2022   Obesity (BMI 30-39.9) 06/02/2022   Stage 3a chronic kidney disease (CKD) (HCC) 06/01/2022   Atypical chest pain 06/01/2022   Acute on chronic anemia  04/21/2022   Parkinson's disease (HCC) 04/21/2022   Leg swelling 04/21/2022   Dizziness 01/15/2021   Carpal tunnel syndrome 12/11/2020   PND (post-nasal drip)    Stenosis of right carotid artery    Acute kidney injury superimposed on CKD (HCC)    Elevated troponin 11/18/2020   [name] 11/17/2020   Back pain without sciatica 11/07/2020   Numbness in both hands 11/07/2020   Numbness 10/05/2019   Axillary abscess 11/12/2018   Status post total replacement of both hips 10/04/2018   Prediabetes 10/30/2017   Acute on chronic diastolic CHF (congestive heart failure) (HCC) 10/29/2017   Fatigue 07/29/2017   Mood disorder (HCC) 07/29/2017   Constipation 06/26/2017   S/P total knee arthroplasty 06/23/2017   Pulmonary hypertension associated with sarcoidosis (HCC) 02/03/2017   Lower extremity edema 11/28/2016   Familial tremor 09/24/2016   Hypertension    Allergic rhinitis due to pollen    CAD (coronary artery disease)    Aortic atherosclerosis (HCC)    GERD (gastroesophageal reflux disease)    PAD (peripheral artery disease) (HCC)  Osteoarthritis of both knees    Obstructive sleep apnea    Neuropathy    Dyslipidemia 11/19/2013   Thyroid nodule 11/19/2013   PCP:  Enid Baas, MD Pharmacy:   [facility] [phone] [facility] - [place], [place] - [address] [place] [address] Phone: [phone] Fax: [phone]  [facility] [address] [place] [address] Phone: [phone] Fax: [phone]     Social Drivers of Health (SDOH) Social History: SDOH Screenings   Food Insecurity: No Food Insecurity (02/14/2024)  Housing: Low Risk  (02/14/2024)  Transportation Needs: No Transportation Needs (02/14/2024)  Utilities: Not At Risk (02/14/2024)  Alcohol Screen: Low Risk  (10/11/2021)  Depression (PHQ2-9): Low Risk  (10/11/2021)  Financial Resource Strain: Low Risk  (09/02/2023)   Received from [facility]  Physical Activity: Inactive (10/11/2021)  Social Connections: Moderately Integrated (02/14/2024)  Stress: No Stress Concern Present (10/11/2021)  Tobacco Use: Low Risk  (02/14/2024)   SDOH Interventions:     Readmission Risk Interventions     No data to display

## 2024-03-07 NOTE — [place] FL2 (Signed)
 [place] MEDICAID FL2 LEVEL OF CARE FORM     IDENTIFICATION  Patient Name: Sheila Long Birthdate: 12/22/1924 Sex: female Admission Date (Current Location): 03/06/2024  [facility] and [place] Number:  Chiropodist and Address:  [facility], [address] [place], [place] [address]      Provider Number: [id number]  Attending Physician Name and Address:  Arnetha Courser, MD  Relative Name and Phone Number:  Malena Catholic (Sister)  [phone] [facility])    Current Level of Care: Hospital Recommended Level of Care: Skilled Nursing Facility Prior Approval Number:    Date Approved/Denied:   PASRR Number: [id number]  Discharge Plan:      Current Diagnoses: Patient Active Problem List   Diagnosis Date Noted   Acute CHF (congestive heart failure) (HCC) 03/07/2024   Leukocytosis 03/07/2024   CHF (congestive heart failure) (HCC) 03/06/2024   Acute hypoxic respiratory failure (HCC) 02/13/2024   Fall 01/06/2023   Multiple fractures 01/06/2023   COVID-19 virus infection 08/28/2022   Essential hypertension 08/28/2022   Acute kidney injury superimposed on chronic kidney disease (HCC) 08/28/2022   GERD with esophagitis 08/28/2022   Generalized weakness    UTI (urinary tract infection) 08/27/2022   Acute on chronic congestive heart failure (HCC)    Hospice care    Acute heart failure with preserved ejection fraction (HCC) 06/02/2022   Obesity (BMI 30-39.9) 06/02/2022   Stage 3a chronic kidney disease (CKD) (HCC) 06/01/2022   Atypical chest pain 06/01/2022   Acute on chronic anemia 04/21/2022   Parkinson's disease (HCC) 04/21/2022   Leg swelling 04/21/2022   Dizziness 01/15/2021   Carpal tunnel syndrome 12/11/2020   PND (post-nasal drip)    Stenosis of right carotid artery    Acute kidney injury superimposed on CKD (HCC)    Elevated troponin 11/18/2020   [name] 11/17/2020   Back pain without sciatica 11/07/2020   Numbness  in both hands 11/07/2020   Numbness 10/05/2019   Axillary abscess 11/12/2018   Status post total replacement of both hips 10/04/2018   Prediabetes 10/30/2017   Acute on chronic diastolic CHF (congestive heart failure) (HCC) 10/29/2017   Fatigue 07/29/2017   Mood disorder (HCC) 07/29/2017   Constipation 06/26/2017   S/P total knee arthroplasty 06/23/2017   Pulmonary hypertension associated with sarcoidosis (HCC) 02/03/2017   Lower extremity edema 11/28/2016   Familial tremor 09/24/2016   Hypertension    Allergic rhinitis due to pollen    CAD (coronary artery disease)    Aortic atherosclerosis (HCC)    GERD (gastroesophageal reflux disease)    PAD (peripheral artery disease) (HCC)    Osteoarthritis of both knees    Obstructive sleep apnea    Neuropathy    Dyslipidemia 11/19/2013   Thyroid nodule 11/19/2013    Orientation RESPIRATION BLADDER Height & Weight     Self, Situation, Time, Place  O2 (4 liters) External catheter Weight: 199 lb 8.3 oz (90.5 kg) Height:  5\' 4"  (162.6 cm)  BEHAVIORAL SYMPTOMS/MOOD NEUROLOGICAL BOWEL NUTRITION STATUS      Continent Diet (heart)  AMBULATORY STATUS COMMUNICATION OF NEEDS Skin   Limited Assist Verbally Normal                       Personal Care Assistance Level of Assistance  Bathing, Feeding, Dressing Bathing Assistance: Limited assistance Feeding assistance: Limited assistance Dressing Assistance: Limited assistance     Functional Limitations Info  Hearing, Sight Sight Info: Impaired (glasses) Hearing Info: Impaired (  hard of hearing)      SPECIAL CARE FACTORS FREQUENCY  PT (By licensed PT), OT (By licensed OT)     PT Frequency: 5 times per week OT Frequency: 5 times per week            Contractures      Additional Factors Info  Code Status, Allergies Code Status Info: Do not attempt resuscitation (DNR) PRE-ARREST INTERVENTIONS DESIRED Allergies Info: Tramadol, Sulfa Antibiotics, Zocor (Simvastatin)      Isolation Precautions Info: airborne/contact     Current Medications (03/07/2024):  This is the current hospital active medication list Current Facility-Administered Medications  Medication Dose Route Frequency Provider Last Rate Last Admin   acetaminophen (TYLENOL) tablet 650 mg  650 mg Oral Q4H PRN Emeline General, MD       carbidopa-levodopa (SINEMET IR) 25-100 MG per tablet immediate release 1 tablet  1 tablet Oral QHS Mikey College T, MD   1 tablet at 03/06/24 2221   escitalopram (LEXAPRO) tablet 5 mg  5 mg Oral Daily Mikey College T, MD   5 mg at 03/07/24 0940   furosemide (LASIX) injection 40 mg  40 mg Intravenous BID Mikey College T, MD   40 mg at 03/07/24 0755   guaiFENesin (MUCINEX) 12 hr tablet 1,200 mg  1,200 mg Oral BID Mikey College T, MD   1,200 mg at 03/07/24 4132   heparin injection 5,000 Units  5,000 Units Subcutaneous Q12H Mikey College T, MD   5,000 Units at 03/07/24 0935   ipratropium-albuterol (DUONEB) 0.5-2.5 (3) MG/3ML nebulizer solution 3 mL  3 mL Nebulization Q6H Mikey College T, MD   3 mL at 03/07/24 1426   latanoprost (XALATAN) 0.005 % ophthalmic solution 1 drop  1 drop Both Eyes QHS Mikey College T, MD   1 drop at 03/06/24 2221   lisinopril (ZESTRIL) tablet 20 mg  20 mg Oral Daily Mikey College T, MD   20 mg at 03/07/24 4401   metoprolol succinate (TOPROL-XL) 24 hr tablet 100 mg  100 mg Oral Daily Mikey College T, MD   100 mg at 03/07/24 0941   ondansetron (ZOFRAN) injection 4 mg  4 mg Intravenous Q6H PRN Mikey College T, MD       sodium chloride flush (NS) 0.9 % injection 3 mL  3 mL Intravenous Q12H Mikey College T, MD   3 mL at 03/07/24 0957   sodium chloride flush (NS) 0.9 % injection 3 mL  3 mL Intravenous PRN Emeline General, MD       Current Outpatient Medications  Medication Sig Dispense Refill   carbidopa-levodopa (SINEMET IR) 25-100 MG tablet Take 1 tablet by mouth at bedtime for 7 days. 7 tablet 0   escitalopram (LEXAPRO) 5 MG tablet Take 1 tablet (5 mg total) by mouth daily for 7  days. 7 tablet 0   ferrous sulfate 325 (65 FE) MG tablet Take 1 tablet (325 mg total) by mouth 2 (two) times daily for 7 days. 14 tablet 0   furosemide (LASIX) 20 MG tablet Take 1 tablet (20 mg total) by mouth every other day for 7 days. 4 tablet 0   glycopyrrolate (ROBINUL) 1 MG tablet Take 0.5 mg by mouth daily as needed.     latanoprost (XALATAN) 0.005 % ophthalmic solution Place 1 drop into both eyes at bedtime for 7 days. 2.5 mL 0   lisinopril (ZESTRIL) 20 MG tablet Take 1 tablet (20 mg total) by mouth daily for 7 days. 7 tablet  0   metoprolol succinate (TOPROL-XL) 100 MG 24 hr tablet Take 1 tablet (100 mg total) by mouth daily for 7 days. Take with or immediately following a meal. 7 tablet 0   pantoprazole (PROTONIX) 40 MG tablet Take 1 tablet (40 mg total) by mouth 2 (two) times daily before a meal for 7 days. (Patient not taking: Reported on 03/06/2024) 14 tablet 0     Discharge Medications: Please see discharge summary for a list of discharge medications.  Relevant Imaging Results:  Relevant Lab Results:   Additional Information SS #: [id number]  Ifeanyi Mickelson E Jammie Troup, LCSW

## 2024-03-07 NOTE — Assessment & Plan Note (Signed)
 Echocardiogram done in June 2023 with low normal EF and grade 1 diastolic dysfunction. Repeat echocardiogram ordered-pending -Continue with IV diuresis -Strict intake and output -Daily weight and BMP

## 2024-03-07 NOTE — Plan of Care (Signed)

## 2024-03-07 NOTE — Assessment & Plan Note (Signed)
 Elevated blood pressure on presentation.  Has been improved. -Continue to monitor -Continue home antihypertensives

## 2024-03-07 NOTE — ED Notes (Signed)
 Pt has called out twice to ask about the plan. Attempted to explain plan to pt. Pt is on 40L HHFNC and getting lasix. Attempted to explain this to pt. Told pt to ask hospitalist more questions when they come.

## 2024-03-07 NOTE — Assessment & Plan Note (Signed)
 Continue home Sinemet.

## 2024-03-07 NOTE — ED Notes (Signed)
 Spoke with RT who said to removed HFNC and place pt on 4L [place]. Also pt placed on bedpan.

## 2024-03-07 NOTE — Evaluation (Signed)
 Physical Therapy Evaluation Patient Details Name: Sheila Long MRN: [id number] DOB: [birth year] Today's Date: 03/07/2024  History of Present Illness  Pt admitted for CHF and pending respiratory workup. Recent admission for similar symptoms including cough and SOB. PMH inclues Parkinson's dx, CAD, GERD, neuropathy, and sarcoidosis.  Clinical Impression  Pt is a pleasant 89 year old female who was admitted for CHF. Pt performs bed mobility with mod assist, transfers with mod assist, and ambulation with min assist and HHA. Anticipate improved technique with AD, will bring for subsequent session. Pt demonstrates deficits with strength/mobility/balance. All mobility performed on 4L with quick desat with exeriton and increased WOB. Pt very HOH and difficulty communicating. Would ideally benefit from co-treat with OT for energy conservation. Currently isn't at baseline level. Would benefit from skilled PT to address above deficits and promote optimal return to PLOF. Pt will continue to receive skilled PT services while admitted and will defer to TOC/care team for updates regarding disposition planning.         If plan is discharge home, recommend the following: A little help with walking and/or transfers;A little help with bathing/dressing/bathroom;Assistance with feeding;Direct supervision/assist for financial management;Assistance with cooking/housework;Assist for transportation;Help with stairs or ramp for entrance;Direct supervision/assist for medications management   Can travel by private vehicle   No    Equipment Recommendations None recommended by PT  Recommendations for Other Services       Functional Status Assessment Patient has had a recent decline in their functional status and demonstrates the ability to make significant improvements in function in a reasonable and predictable amount of time.     Precautions / Restrictions Precautions Precautions: Fall Recall of  Precautions/Restrictions: Impaired Restrictions Weight Bearing Restrictions Per Provider Order: No      Mobility  Bed Mobility Overal bed mobility: Needs Assistance Bed Mobility: Supine to Sit, Sit to Supine     Supine to sit: Mod assist Sit to supine: Mod assist   General bed mobility comments: needs assist for trunkal elevation. Once seated at EOB, upright posture noted    Transfers Overall transfer level: Needs assistance Equipment used: 1 person hand held assist Transfers: Sit to/from Stand Sit to Stand: Mod assist           General transfer comment: RW not available. HHA given. Pt with bracing against EOB and labored breathing with upright posture. All mobility performed on 4L of O2 with sats decreasing to 82% with exertion.    Ambulation/Gait Ambulation/Gait assistance: Min assist Gait Distance (Feet): 2 Feet Assistive device: 1 person hand held assist Gait Pattern/deviations: Step-to pattern       General Gait Details: able to take side steps up towards HOB. Further distance limited due to increased WOB and O2 sats. Once seated, sats improved to 97%  Stairs            Wheelchair Mobility     Tilt Bed    Modified Rankin (Stroke Patients Only)       Balance Overall balance assessment: Needs assistance Sitting-balance support: No upper extremity supported, Feet supported Sitting balance-Leahy Scale: Fair     Standing balance support: Single extremity supported Standing balance-Leahy Scale: Poor                               Pertinent Vitals/Pain Pain Assessment Pain Assessment: No/denies pain    Home Living Family/patient expects to be discharged to:: Skilled nursing facility  Additional Comments: pt with HOH and difficulties understanding questioning with covid iso on. Limited history obtained. Pt reports she has caregivers during daytime, however needs more assistance at night. She reports she has  MOW services.    Prior Function Prior Level of Function : Needs assist             Mobility Comments: was previously ambulatory with walker.       Extremity/Trunk Assessment   Upper Extremity Assessment Upper Extremity Assessment: Generalized weakness (B LE grossly 3+/5)    Lower Extremity Assessment Lower Extremity Assessment: Generalized weakness (B LE grossly 3+/5)       Communication   Communication Communication: Impaired Factors Affecting Communication: Hearing impaired    Cognition Arousal: Alert Behavior During Therapy: WFL for tasks assessed/performed                           PT - Cognition Comments: difficult to determine due to difficulty with communication with covid iso donned Following commands: Intact       Cueing Cueing Techniques: Verbal cues, Gestural cues     General Comments      Exercises     Assessment/Plan    PT Assessment Patient needs continued PT services  PT Problem List Decreased strength;Decreased activity tolerance;Decreased balance;Decreased mobility;Decreased safety awareness;Decreased cognition       PT Treatment Interventions DME instruction;Functional mobility training;Therapeutic activities;Therapeutic exercise;Balance training;Gait training;Neuromuscular re-education;Cognitive remediation;Patient/family education    PT Goals (Current goals can be found in the Care Plan section)  Acute Rehab PT Goals Patient Stated Goal: to get stronger PT Goal Formulation: With patient Time For Goal Achievement: 03/21/24 Potential to Achieve Goals: Good    Frequency Min 2X/week     Co-evaluation               AM-PAC PT "6 Clicks" Mobility  Outcome Measure Help needed turning from your back to your side while in a flat bed without using bedrails?: A Lot Help needed moving from lying on your back to sitting on the side of a flat bed without using bedrails?: A Lot Help needed moving to and from a bed to a  chair (including a wheelchair)?: A Lot Help needed standing up from a chair using your arms (e.g., wheelchair or bedside chair)?: A Lot Help needed to walk in hospital room?: A Lot Help needed climbing 3-5 steps with a railing? : Total 6 Click Score: 11    End of Session Equipment Utilized During Treatment: Oxygen Activity Tolerance: Patient limited by fatigue Patient left: in bed;with call bell/phone within reach;with bed alarm set Nurse Communication: Mobility status;Precautions;Other (comment) PT Visit Diagnosis: Other abnormalities of gait and mobility (R26.89);Muscle weakness (generalized) (M62.81);Difficulty in walking, not elsewhere classified (R26.2)    Time: 1610-9604 PT Time Calculation (min) (ACUTE ONLY): 16 min   Charges:   PT Evaluation $PT Eval Low Complexity: 1 Low   PT General Charges $$ ACUTE PT VISIT: 1 Visit         Elizabeth Palau, PT, DPT, GCS [phone]   Zala Degrasse 03/07/2024, 2:41 PM

## 2024-03-07 NOTE — Progress Notes (Signed)
 Progress Note   [name] ZOX:[id number] DOB: 1924-05-08 DOA: 03/06/2024     0 DOS: the patient was seen and examined on 03/07/2024   Brief hospital course: Taken from H&P.   Sheila Long is a 89 y.o. female with medical history significant of Parkinson's disease, chronic HFpEF, CAD, GERD, neuropathy, sarcoidosis, chronic ambulatory impairment, presented with worsening of cough and shortness of breath.   Patient was recently hospitalized for influenza A pneumonia and stabilized and discharged home.  This time, patient started to develop productive cough with clear phlegm about 3 to 4 days ago, gradually getting worse and started develop increasing exertional dyspnea.  On presentation patient was found to have elevated blood pressure and initially placed on BiPAP due to increased work of breathing.  Labs with leukocytosis at 15.3, VBG 7.28/61/56.  Chest x-ray with bilateral pulmonary congestion.  Patient received 1 dose of labetalol and started on IV diuresis.  3/9: Vital stable, she was on 40 L with heated high flow and saturating 100%-able to wean to 4 L pretty quickly.  No baseline oxygen use. BNP 358, troponin elevated which peaked at 334-likely demand ischemia.  Leukocytosis resolved.  Macrocytosis.  Procalcitonin at 0.16, MRSA PCR negative, respiratory panel negative.  Echocardiogram pended Will continue with IV diuresis as labs seems stable  Assessment and Plan: * Acute hypoxic respiratory failure (HCC) Likely due to acute on chronic HFpEF.  Elevated BNP and chest x-ray with concern of pulmonary edema, which seems improving on repeat chest x-ray this morning. Initially required BiPAP followed by heated high flow and now weaned to 4 L of oxygen.  No baseline oxygen use Patient has recent influenza A, MRSA PCR and repeat respiratory panel negative, less likely pneumonia. -Continue supplemental oxygen-wean as tolerated  Acute on chronic congestive heart failure  (HCC) Echocardiogram done in June 2023 with low normal EF and grade 1 diastolic dysfunction. Repeat echocardiogram ordered-pending -Continue with IV diuresis -Strict intake and output -Daily weight and BMP  Essential hypertension Elevated blood pressure on presentation.  Has been improved. -Continue to monitor -Continue home antihypertensives  CAD (coronary artery disease) No chest pain and troponin peaked at 334, likely due to demand ischemia.  ACS ruled out -Continue to monitor  Leukocytosis Likely reactive with acute hypoxic failure.  No concern of any infection at this time.  No need for antibiotics Improved. -Continue to monitor  Parkinson's disease (HCC) -Continue home Sinemet   Subjective: Hard of hearing lady.  Denies any chest pain.  Does not use oxygen at baseline.  She was trying to talk while eating and requiring multiple reminders.  Physical Exam: Vitals:   03/07/24 1030 03/07/24 1134 03/07/24 1200 03/07/24 1215  BP: (!) 110/52 (!) 113/50 (!) 98/47   Pulse: 67 64 68   Resp: (!) 22 (!) 21 20   Temp:    98.3 F (36.8 C)  TempSrc:    Oral  SpO2: 99% 99% 100%   Weight:      Height:       General.  Frail, hard of hearing elderly lady, in no acute distress. Pulmonary.  Lungs clear bilaterally, normal respiratory effort. CV.  Regular rate and rhythm, no JVD, rub or murmur. Abdomen.  Soft, nontender, nondistended, BS positive. CNS.  Alert and oriented .  No focal neurologic deficit. Extremities.  No edema, no cyanosis, pulses intact and symmetrical.  Data Reviewed: Prior data reviewed  Family Communication: Unable to reach contact listed in her chart  Disposition: Status is: Inpatient  Remains inpatient appropriate because: Severity of illness  Planned Discharge Destination: Likely go back to SNF  DVT prophylaxis.  Subcu heparin Time spent: 50 minutes  This record has been created using Conservation officer, historic buildings. Errors have been sought and  corrected,but may not always be located. Such creation errors do not reflect on the standard of care.   Author: Arnetha Courser, MD 03/07/2024 12:50 PM  For on call review [email or web address].

## 2024-03-07 NOTE — Assessment & Plan Note (Signed)
 No chest pain and troponin peaked at 334, likely due to demand ischemia.  ACS ruled out -Continue to monitor

## 2024-03-07 NOTE — Hospital Course (Addendum)
 Taken from H&P.   Sheila Long is a 89 y.o. female with medical history significant of Parkinson's disease, chronic HFpEF, CAD, GERD, neuropathy, sarcoidosis, chronic ambulatory impairment, presented with worsening of cough and shortness of breath.   Patient was recently hospitalized for influenza A pneumonia and stabilized and discharged to SNF.  This time, patient started to develop productive cough with clear phlegm about 3 to 4 days ago, gradually getting worse and started develop increasing exertional dyspnea.  On presentation patient was found to have elevated blood pressure and initially placed on BiPAP due to increased work of breathing.  Labs with leukocytosis at 15.3, VBG 7.28/61/56.  Chest x-ray with bilateral pulmonary congestion.  Patient received 1 dose of labetalol and started on IV diuresis.  3/9: Vital stable, she was on 40 L with heated high flow and saturating 100%-able to wean to 4 L pretty quickly.  No baseline oxygen use. BNP 358, troponin elevated which peaked at 334-likely demand ischemia.  Leukocytosis resolved.  Macrocytosis.  Procalcitonin at 0.16, MRSA PCR negative, respiratory panel negative.  Echocardiogram pended Will continue with IV diuresis as labs seems stable.  3/10: Hemodynamically stable, able to wean back to room air.  Slight increase in creatinine today so holding further IV Lasix and we can start her on p.o. diuretic from tomorrow.  Pending echocardiogram.  Pending insurance authorization for going back to rehab.  3/11: Hemodynamically stable.  Echocardiogram with normal EF and grade 2 diastolic dysfunction.  No other significant abnormality.  Renal function back to baseline, started on low-dose torsemide. Pended insurance authorization for rehab.  3/12: Remained hemodynamically stable, insurance denied rehab even with peer to peer stating that she is at baseline and already use her Medicare days.  Apparently she has refused going to assisted living  as she does not want to give of her home.  Talk with niece and they are coming up with some plan to help her as she lives alone.  Ordered [facility] and hoping that she can go home by tomorrow.  3/13: Patient remained hemodynamically stable.  [facility] were arranged and patient is being discharged home with family. She was also provided with private pay home care agencies information with the family can contact to hire someone to provide extra care whatever needed after getting home health services through Medicare.  Her home Lasix was discontinued and instead she will take low-dose home torsemide.  She will current the rest of her home medications and need to have a close follow-up with her providers for further management.

## 2024-03-07 NOTE — ED Notes (Signed)
 Hospitalist was at bedside with pt.

## 2024-03-08 ENCOUNTER — Inpatient Hospital Stay
Admit: 2024-03-08 | Discharge: 2024-03-08 | Disposition: A | Discharge status: Home / Self Care | Attending: Internal Medicine

## 2024-03-08 DIAGNOSIS — I5033 Acute on chronic diastolic (congestive) heart failure: Secondary | ICD-10-CM | POA: Diagnosis not present

## 2024-03-08 DIAGNOSIS — I2510 Atherosclerotic heart disease of native coronary artery without angina pectoris: Secondary | ICD-10-CM | POA: Diagnosis not present

## 2024-03-08 DIAGNOSIS — J9601 Acute respiratory failure with hypoxia: Secondary | ICD-10-CM | POA: Diagnosis not present

## 2024-03-08 DIAGNOSIS — I10 Essential (primary) hypertension: Secondary | ICD-10-CM | POA: Diagnosis not present

## 2024-03-08 LAB — BASIC METABOLIC PANEL
Anion gap: 6 (ref 5–15)
BUN: 27 mg/dL — ABNORMAL HIGH (ref 8–23)
CO2: 30 mmol/L (ref 22–32)
Calcium: 8.6 mg/dL — ABNORMAL LOW (ref 8.9–10.3)
Chloride: 104 mmol/L (ref 98–111)
Creatinine, Ser: 1.06 mg/dL — ABNORMAL HIGH (ref 0.44–1.00)
GFR, Estimated: 48 mL/min — ABNORMAL LOW (ref >60)
Glucose, Bld: 105 mg/dL — ABNORMAL HIGH (ref 70–99)
Potassium: 4.1 mmol/L (ref 3.5–5.1)
Sodium: 140 mmol/L (ref 135–145)

## 2024-03-08 LAB — CBC
HCT: 32.9 % — ABNORMAL LOW (ref 36.0–46.0)
Hemoglobin: 10.9 g/dL — ABNORMAL LOW (ref 12.0–15.0)
MCH: 34.5 pg — ABNORMAL HIGH (ref 26.0–34.0)
MCHC: 33.1 g/dL (ref 30.0–36.0)
MCV: 104.1 fL — ABNORMAL HIGH (ref 80.0–100.0)
Platelets: 199 10*3/uL (ref 150–400)
RBC: 3.16 MIL/uL — ABNORMAL LOW (ref 3.87–5.11)
RDW: 14.2 % (ref 11.5–15.5)
WBC: 9 10*3/uL (ref 4.0–10.5)
nRBC: 0 % (ref 0.0–0.2)

## 2024-03-08 MED ORDER — IPRATROPIUM-ALBUTEROL 0.5-2.5 (3) MG/3ML IN SOLN
3.0000 mL | Freq: Two times a day (BID) | RESPIRATORY_TRACT | Status: DC
  Administered 2024-03-08 – 2024-03-10 (×4): 3 mL via RESPIRATORY_TRACT
  Filled 2024-03-08 (×4): qty 3

## 2024-03-08 MED ORDER — HALOPERIDOL LACTATE 5 MG/ML IJ SOLN
1.0000 mg | Freq: Four times a day (QID) | INTRAMUSCULAR | Status: DC | PRN
  Administered 2024-03-08: 2 mg via INTRAMUSCULAR
  Filled 2024-03-08: qty 1

## 2024-03-08 MED ORDER — DOCUSATE SODIUM 100 MG PO CAPS
100.0000 mg | ORAL_CAPSULE | Freq: Two times a day (BID) | ORAL | Status: DC | PRN
  Administered 2024-03-08 – 2024-03-09 (×2): 100 mg via ORAL
  Filled 2024-03-08 (×2): qty 1

## 2024-03-08 MED ORDER — FLEET ENEMA RE ENEM
1.0000 | ENEMA | Freq: Every day | RECTAL | Status: DC | PRN
  Administered 2024-03-11: 1 via RECTAL

## 2024-03-08 NOTE — Assessment & Plan Note (Signed)
 Likely due to acute on chronic HFpEF.  Elevated BNP and chest x-ray with concern of pulmonary edema, which seems improving on repeat chest x-ray this morning. Initially required BiPAP followed by heated high flow and now weaned back to room air.  No baseline oxygen use Patient has recent influenza A, MRSA PCR and repeat respiratory panel negative, less likely pneumonia. -Weaned to room air.

## 2024-03-08 NOTE — TOC Progression Note (Signed)
 Transition of Care [facility]) - Progression Note    Patient Details  Name: Candi Profit MRN: [id number] Date of Birth: June 30, 1924  [facility]) CM/SW Contact  Truddie Hidden, RN Phone Number: 03/08/2024, 11:27 AM  Clinical Narrative:    Sherron Monday with Dabe, Admissions Coordinator from [employer]. Patient can return today pending auth.   Spoke with Tammy from [facility] to start auth.     Expected Discharge Plan: Skilled Nursing Facility Barriers to Discharge: Continued Medical Work up  Expected Discharge Plan and Services                                               Social Determinants of Health (SDOH) Interventions SDOH Screenings   Food Insecurity: No Food Insecurity (03/07/2024)  Housing: Low Risk  (03/07/2024)  Transportation Needs: No Transportation Needs (03/07/2024)  Utilities: Not At Risk (03/07/2024)  Alcohol Screen: Low Risk  (10/11/2021)  Depression (PHQ2-9): Low Risk  (10/11/2021)  Financial Resource Strain: Low Risk  (09/02/2023)   Received from [facility]  Physical Activity: Inactive (10/11/2021)  Social Connections: Moderately Integrated (03/07/2024)  Stress: No Stress Concern Present (10/11/2021)  Tobacco Use: Low Risk  (02/14/2024)    Readmission Risk Interventions     No data to display

## 2024-03-08 NOTE — Progress Notes (Signed)
 Progress Note   [name] DGU:[id number] DOB: December 20, 1924 DOA: 03/06/2024     1 DOS: the patient was seen and examined on 03/08/2024   Brief hospital course: Taken from H&P.   Syana Degraffenreid is a 89 y.o. female with medical history significant of Parkinson's disease, chronic HFpEF, CAD, GERD, neuropathy, sarcoidosis, chronic ambulatory impairment, presented with worsening of cough and shortness of breath.   Patient was recently hospitalized for influenza A pneumonia and stabilized and discharged to SNF.  This time, patient started to develop productive cough with clear phlegm about 3 to 4 days ago, gradually getting worse and started develop increasing exertional dyspnea.  On presentation patient was found to have elevated blood pressure and initially placed on BiPAP due to increased work of breathing.  Labs with leukocytosis at 15.3, VBG 7.28/61/56.  Chest x-ray with bilateral pulmonary congestion.  Patient received 1 dose of labetalol and started on IV diuresis.  3/9: Vital stable, she was on 40 L with heated high flow and saturating 100%-able to wean to 4 L pretty quickly.  No baseline oxygen use. BNP 358, troponin elevated which peaked at 334-likely demand ischemia.  Leukocytosis resolved.  Macrocytosis.  Procalcitonin at 0.16, MRSA PCR negative, respiratory panel negative.  Echocardiogram pended Will continue with IV diuresis as labs seems stable.  3/10: Hemodynamically stable, able to wean back to room air.  Slight increase in creatinine today so holding further IV Lasix and we can start her on p.o. diuretic from tomorrow.  Pending echocardiogram.  Pending insurance authorization for going back to rehab.  Assessment and Plan: * Acute hypoxic respiratory failure (HCC) Likely due to acute on chronic HFpEF.  Elevated BNP and chest x-ray with concern of pulmonary edema, which seems improving on repeat chest x-ray this morning. Initially required BiPAP followed by  heated high flow and now weaned back to room air.  No baseline oxygen use Patient has recent influenza A, MRSA PCR and repeat respiratory panel negative, less likely pneumonia. -Continue supplemental oxygen-wean as tolerated  Acute on chronic congestive heart failure (HCC) Echocardiogram done in June 2023 with low normal EF and grade 1 diastolic dysfunction. Repeat echocardiogram ordered-pending -Holding further IV diuresis as clinically appears euvolemic and increasing creatinine -Strict intake and output -Daily weight and BMP  Essential hypertension Elevated blood pressure on presentation.  Has been improved. -Continue to monitor -Continue home antihypertensives  CAD (coronary artery disease) No chest pain and troponin peaked at 334, likely due to demand ischemia.  ACS ruled out -Continue to monitor  Leukocytosis Likely reactive with acute hypoxic failure.  No concern of any infection at this time.  No need for antibiotics Improved. -Continue to monitor  Parkinson's disease (HCC) -Continue home Sinemet   Subjective: Patient was seen and examined today.  No new concern.  Very hard of hearing elderly lady.  Physical Exam: Vitals:   03/08/24 0729 03/08/24 0806 03/08/24 0902 03/08/24 1109  BP: (!) 130/58  (!) 130/58 (!) 134/58  Pulse: 73  73 67  Resp: 18   18  Temp: 97.6 F (36.4 C)   98.1 F (36.7 C)  TempSrc: Oral     SpO2: 100% 99% 93% 100%  Weight:      Height:       General.  Frail and hard of hearing elderly lady, in no acute distress. Pulmonary.  Lungs clear bilaterally, normal respiratory effort. CV.  Regular rate and rhythm, no JVD, rub or murmur. Abdomen.  Soft, nontender, nondistended, BS positive. CNS.  Alert and oriented .  No focal neurologic deficit. Extremities.  No edema, no cyanosis, pulses intact and symmetrical.  Data Reviewed: Prior data reviewed  Family Communication:   Disposition: Status is: Inpatient Remains inpatient appropriate  because: Severity of illness  Planned Discharge Destination: Likely go back to SNF-pending insurance authorization now  DVT prophylaxis.  Subcu heparin Time spent: 45 minutes  This record has been created using Conservation officer, historic buildings. Errors have been sought and corrected,but may not always be located. Such creation errors do not reflect on the standard of care.   Author: Arnetha Courser, MD 03/08/2024 2:20 PM  For on call review [email or web address].

## 2024-03-08 NOTE — Evaluation (Signed)
 Occupational Therapy Evaluation Patient Details Name: Sheila Long MRN: [id number] DOB: 10/12/1924 Today's Date: 03/08/2024   History of Present Illness   Pt is a 89 yo female admitted for CHF and Flu A. Recent admission for similar symptoms including cough and SOB. PMH inclues Parkinson's dx, CAD, GERD, neuropathy, and sarcoidosis.     Clinical Impressions Pt was seen for OT evaluation this date. PTA, per chart review 1 year ago pt ws MOD I, however recent hospitalization ~1 month ago noted pt unable to provide any PLOF/history d/t cognitive deficits. Pt reports today that she lived at home alone with family checking in, but was mostly IND. Chart also mentions a paid caregiver at home that recently had surgery and has been unable to assist. Pt reports she gets meals on wheels and has life alert necklace on.  Pt presents to acute OT demonstrating impaired ADL performance and functional mobility 2/2 weakness, balance deficits, and low activity tolerance. Pt alert and oriented to person and time, states place as hospital. Pt currently requires Max A for bed mobility, CGA for STS from EOB to RW, CGA for in room mobility to the bathroom and back to recliner. Mod A for toilet transfer, Max A for LB dressing to don mesh panties, CGA for anterior hygiene in standing. Verb cues needed for safety and pt is HOH. Placed on RA for mobility during session with drop to 89%, placed back on 1L with improvement to 93-95%.  Pt would benefit from skilled OT services to address noted impairments and functional limitations (see below for any additional details) in order to maximize safety and independence while minimizing falls risk and caregiver burden. Do anticipate the need for follow up OT services upon acute hospital DC.      If plan is discharge home, recommend the following:   A little help with walking and/or transfers;A little help with bathing/dressing/bathroom;Assistance with  cooking/housework;Assistance with feeding;Direct supervision/assist for medications management;Direct supervision/assist for financial management;Assist for transportation;Help with stairs or ramp for entrance;Supervision due to cognitive status     Functional Status Assessment   Patient has had a recent decline in their functional status and demonstrates the ability to make significant improvements in function in a reasonable and predictable amount of time.     Equipment Recommendations   Other (comment) (defer)     Recommendations for Other Services         Precautions/Restrictions   Precautions Precautions: Fall Recall of Precautions/Restrictions: Impaired Restrictions Weight Bearing Restrictions Per Provider Order: No     Mobility Bed Mobility Overal bed mobility: Needs Assistance Bed Mobility: Supine to Sit     Supine to sit: Max assist, HOB elevated     General bed mobility comments: Max A to reach EOB    Transfers Overall transfer level: Needs assistance Equipment used: Rolling walker (2 wheels) Transfers: Sit to/from Stand Sit to Stand: Contact guard assist           General transfer comment: CGA for STS from EOB to RW and CGA for in room mobility ~15-20 feet using RW on RA dropped to 89%, placed back on 1L and back up to 93-95%      Balance Overall balance assessment: Needs assistance Sitting-balance support: No upper extremity supported, Feet supported Sitting balance-Leahy Scale: Good Sitting balance - Comments: steady reaching within BOS   Standing balance support: Single extremity supported Standing balance-Leahy Scale: Fair Standing balance comment: RW use and CGA  ADL either performed or assessed with clinical judgement   ADL Overall ADL's : Needs assistance/impaired Eating/Feeding: Supervision/ safety;Set up;Sitting Eating/Feeding Details (indicate cue type and reason): in recliner, all  containers opened and food cut up by nurse Grooming: Wash/dry hands;Contact guard assist;Standing Grooming Details (indicate cue type and reason): at RW at sink with cueing for location of soap             Lower Body Dressing: Maximal assistance Lower Body Dressing Details (indicate cue type and reason): to don mesh panties following toileting Toilet Transfer: Rolling walker (2 wheels);Regular Toilet;Grab bars;Moderate assistance Toilet Transfer Details (indicate cue type and reason): Mod A for STS from low toilet using grab bar Toileting- Clothing Manipulation and Hygiene: Contact guard assist;Sit to/from stand Toileting - Clothing Manipulation Details (indicate cue type and reason): anterior hygiene in standing     Functional mobility during ADLs: Contact guard assist;Rolling walker (2 wheels)       Vision         Perception         Praxis         Pertinent Vitals/Pain Pain Assessment Pain Assessment: No/denies pain Pain Intervention(s): Monitored during session     Extremity/Trunk Assessment Upper Extremity Assessment Upper Extremity Assessment: Generalized weakness   Lower Extremity Assessment Lower Extremity Assessment: Generalized weakness       Communication Communication Communication: Impaired Factors Affecting Communication: Hearing impaired   Cognition Arousal: Alert Behavior During Therapy: WFL for tasks assessed/performed               OT - Cognition Comments: states her name, that she is in the hospital and that it is March 2025; unable to provide much history due HOH                 Following commands: Intact Following commands impaired: Follows one step commands with increased time     Cueing  General Comments   Cueing Techniques: Verbal cues;Gestural cues  on RA dropped to 89%, needed 1L to improve to 93-95%   Exercises Other Exercises Other Exercises: Edu on role of OT in acute setting.   Shoulder Instructions       Home Living Family/patient expects to be discharged to:: Skilled nursing facility                                 Additional Comments: pt HOH, hard to obtain history but states she lives alone with family who can check in-has life alert necklace on. Per previous admission pt reports she had caregivers during daytime, however needs more assistance at night. Caregiver with recent surgery and not available? She reports she has MOW services/gets meals on wheels      Prior Functioning/Environment Prior Level of Function : Needs assist       Physical Assist : Mobility (physical);ADLs (physical) Mobility (physical): Bed mobility;Transfers;Gait ADLs (physical): Feeding;Grooming;Bathing;Dressing;Toileting;IADLs Mobility Comments: was previously ambulatory with walker. ADLs Comments: Pt mentiones caregiver that is currently not available to assist due to having surgery    OT Problem List: Decreased strength;Decreased activity tolerance;Impaired balance (sitting and/or standing);Decreased cognition;Decreased knowledge of use of DME or AE   OT Treatment/Interventions: Self-care/ADL training;Therapeutic exercise;Energy conservation;DME and/or AE instruction;Therapeutic activities;Balance training;Patient/family education      OT Goals(Current goals can be found in the care plan section)   Acute Rehab OT Goals Patient Stated Goal: get better OT Goal Formulation: With patient Time For  Goal Achievement: 03/22/24 Potential to Achieve Goals: Good ADL Goals Pt Will Perform Lower Body Bathing: with contact guard assist;sitting/lateral leans;sit to/from stand Pt Will Perform Lower Body Dressing: with contact guard assist;sitting/lateral leans;sit to/from stand Pt Will Transfer to Toilet: ambulating;regular height toilet;with supervision Pt Will Perform Toileting - Clothing Manipulation and hygiene: with modified independence;sitting/lateral leans;sit to/from stand Additional ADL  Goal #1: Pt will demo/verbalize implementation of 1 learned ECS to use during ADL performance 2/2 trials to prevent overexertion.   OT Frequency:  Min 2X/week    Co-evaluation              AM-PAC OT "6 Clicks" Daily Activity     Outcome Measure Help from another person eating meals?: None Help from another person taking care of personal grooming?: A Little Help from another person toileting, which includes using toliet, bedpan, or urinal?: A Little Help from another person bathing (including washing, rinsing, drying)?: A Lot Help from another person to put on and taking off regular upper body clothing?: A Little Help from another person to put on and taking off regular lower body clothing?: A Lot 6 Click Score: 17   End of Session Equipment Utilized During Treatment: Rolling walker (2 wheels);Oxygen Nurse Communication: Mobility status  Activity Tolerance: Patient tolerated treatment well Patient left: with call bell/phone within reach;in chair;with chair alarm set  OT Visit Diagnosis: Unsteadiness on feet (R26.81);Muscle weakness (generalized) (M62.81);Other abnormalities of gait and mobility (R26.89)                Time: 2130-8657 OT Time Calculation (min): 28 min Charges:  OT General Charges $OT Visit: 1 Visit OT Evaluation $OT Eval Moderate Complexity: 1 Mod OT Treatments $Self Care/Home Management : 8-22 mins Koya Hunger, OTR/L 03/08/24, 9:44 AM  Constance Goltz 03/08/2024, 9:38 AM

## 2024-03-08 NOTE — Progress Notes (Signed)
*  PRELIMINARY RESULTS* Echocardiogram 2D Echocardiogram has been performed.  Sheila Long 03/08/2024, 8:04 AM

## 2024-03-08 NOTE — Assessment & Plan Note (Signed)
 Echocardiogram done in June 2023 with low normal EF and grade 1 diastolic dysfunction. Repeat echocardiogram ordered-pending -Holding further IV diuresis as clinically appears euvolemic and increasing creatinine -Strict intake and output -Daily weight and BMP

## 2024-03-08 NOTE — Plan of Care (Signed)
  Problem: Education: Goal: Knowledge of General Education information will improve Description: Including pain rating scale, medication(s)/side effects and non-pharmacologic comfort measures Outcome: Progressing   Problem: Health Behavior/Discharge Planning: Goal: Ability to manage health-related needs will improve Outcome: Progressing   Problem: Nutrition: Goal: Adequate nutrition will be maintained Outcome: Progressing   Problem: Elimination: Goal: Will not experience complications related to urinary retention Outcome: Progressing   

## 2024-03-08 NOTE — Assessment & Plan Note (Signed)
 Likely reactive with acute hypoxic failure.  No concern of any infection at this time.  No need for antibiotics Improved. -Continue to monitor

## 2024-03-08 NOTE — Assessment & Plan Note (Signed)
 Elevated blood pressure on presentation.  Has been improved. -Continue to monitor -Continue home antihypertensives

## 2024-03-09 DIAGNOSIS — J9601 Acute respiratory failure with hypoxia: Secondary | ICD-10-CM | POA: Diagnosis not present

## 2024-03-09 DIAGNOSIS — I5033 Acute on chronic diastolic (congestive) heart failure: Secondary | ICD-10-CM | POA: Diagnosis not present

## 2024-03-09 DIAGNOSIS — I10 Essential (primary) hypertension: Secondary | ICD-10-CM | POA: Diagnosis not present

## 2024-03-09 DIAGNOSIS — I2510 Atherosclerotic heart disease of native coronary artery without angina pectoris: Secondary | ICD-10-CM | POA: Diagnosis not present

## 2024-03-09 LAB — ECHOCARDIOGRAM COMPLETE
AR max vel: 1.67 cm2
AV Area VTI: 2 cm2
AV Area mean vel: 1.72 cm2
AV Mean grad: 4 mmHg
AV Peak grad: 7.1 mmHg
Ao pk vel: 1.33 m/s
Area-P 1/2: 3.81 cm2
Height: 64 in
MV M vel: 6.12 m/s
MV Peak grad: 149.8 mmHg
MV VTI: 1.48 cm2
Radius: 0.4 cm
S' Lateral: 2.8 cm
Weight: 3192.26 [oz_av]

## 2024-03-09 LAB — BASIC METABOLIC PANEL
Anion gap: 6 (ref 5–15)
BUN: 22 mg/dL (ref 8–23)
CO2: 29 mmol/L (ref 22–32)
Calcium: 8.4 mg/dL — ABNORMAL LOW (ref 8.9–10.3)
Chloride: 102 mmol/L (ref 98–111)
Creatinine, Ser: 0.92 mg/dL (ref 0.44–1.00)
GFR, Estimated: 57 mL/min — ABNORMAL LOW (ref >60)
Glucose, Bld: 98 mg/dL (ref 70–99)
Potassium: 3.9 mmol/L (ref 3.5–5.1)
Sodium: 137 mmol/L (ref 135–145)

## 2024-03-09 MED ORDER — POLYETHYLENE GLYCOL 3350 17 G PO PACK
17.0000 g | PACK | Freq: Every day | ORAL | Status: DC
  Administered 2024-03-09 – 2024-03-11 (×3): 17 g via ORAL
  Filled 2024-03-09 (×3): qty 1

## 2024-03-09 MED ORDER — TORSEMIDE 20 MG PO TABS
20.0000 mg | ORAL_TABLET | Freq: Every day | ORAL | Status: DC
  Administered 2024-03-09 – 2024-03-11 (×3): 20 mg via ORAL
  Filled 2024-03-09 (×3): qty 1

## 2024-03-09 NOTE — Progress Notes (Signed)
 Heart Failure Navigator Progress Note  Assessed for Heart & Vascular TOC clinic readiness.  Patient does not meet criteria due to current admission for Respiratory Distress and will be returning to a SNF after discharge.   Navigator available for reassessment of patient but will sign off at this time.  Roxy Horseman, RN, BSN [facility] Heart Failure Navigator Secure Chat Only

## 2024-03-09 NOTE — TOC Progression Note (Signed)
 Transition of Care [facility]) - Progression Note    Patient Details  Name: Sheila Long MRN: [id number] Date of Birth: 1924/08/13  Transition of Care [facility]) CM/SW Contact  Truddie Hidden, RN Phone Number: 03/09/2024, 11:20 AM  Clinical Narrative:    Micah Flesher still pending for [employer].  MD notified.    Expected Discharge Plan: Skilled Nursing Facility Barriers to Discharge: Continued Medical Work up  Expected Discharge Plan and Services                                               Social Determinants of Health (SDOH) Interventions SDOH Screenings   Food Insecurity: No Food Insecurity (03/07/2024)  Housing: Low Risk  (03/07/2024)  Transportation Needs: No Transportation Needs (03/07/2024)  Utilities: Not At Risk (03/07/2024)  Alcohol Screen: Low Risk  (10/11/2021)  Depression (PHQ2-9): Low Risk  (10/11/2021)  Financial Resource Strain: Low Risk  (09/02/2023)   Received from [facility]  Physical Activity: Inactive (10/11/2021)  Social Connections: Moderately Integrated (03/07/2024)  Stress: No Stress Concern Present (10/11/2021)  Tobacco Use: Low Risk  (02/14/2024)    Readmission Risk Interventions     No data to display

## 2024-03-09 NOTE — Progress Notes (Signed)
 Progress Note   [name] ZOX:[id number] DOB: February 11, 1924 DOA: 03/06/2024     2 DOS: the patient was seen and examined on 03/09/2024   Brief hospital course: Taken from H&P.   Sheila Long is a 89 y.o. female with medical history significant of Parkinson's disease, chronic HFpEF, CAD, GERD, neuropathy, sarcoidosis, chronic ambulatory impairment, presented with worsening of cough and shortness of breath.   Patient was recently hospitalized for influenza A pneumonia and stabilized and discharged to SNF.  This time, patient started to develop productive cough with clear phlegm about 3 to 4 days ago, gradually getting worse and started develop increasing exertional dyspnea.  On presentation patient was found to have elevated blood pressure and initially placed on BiPAP due to increased work of breathing.  Labs with leukocytosis at 15.3, VBG 7.28/61/56.  Chest x-ray with bilateral pulmonary congestion.  Patient received 1 dose of labetalol and started on IV diuresis.  3/9: Vital stable, she was on 40 L with heated high flow and saturating 100%-able to wean to 4 L pretty quickly.  No baseline oxygen use. BNP 358, troponin elevated which peaked at 334-likely demand ischemia.  Leukocytosis resolved.  Macrocytosis.  Procalcitonin at 0.16, MRSA PCR negative, respiratory panel negative.  Echocardiogram pended Will continue with IV diuresis as labs seems stable.  3/10: Hemodynamically stable, able to wean back to room air.  Slight increase in creatinine today so holding further IV Lasix and we can start her on p.o. diuretic from tomorrow.  Pending echocardiogram.  Pending insurance authorization for going back to rehab.  3/11: Hemodynamically stable.  Echocardiogram with normal EF and grade 2 diastolic dysfunction.  No other significant abnormality.  Renal function back to baseline, started on low-dose torsemide. [employer] authorization for rehab.  Assessment and Plan: *  Acute hypoxic respiratory failure (HCC) Likely due to acute on chronic HFpEF.  Elevated BNP and chest x-ray with concern of pulmonary edema, which seems improving on repeat chest x-ray this morning. Initially required BiPAP followed by heated high flow and now weaned back to room air.  No baseline oxygen use Patient has recent influenza A, MRSA PCR and repeat respiratory panel negative, less likely pneumonia. -Weaned to room air.  Acute on chronic congestive heart failure (HCC) Echocardiogram done in June 2023 with low normal EF and grade 1 diastolic dysfunction. Repeat Echo with normal EF and and grade 2 diastolic dysfunction IV diuresis was held yesterday due to increasing creatinine which improved today so starting on low-dose torsemide daily -Strict intake and output -Daily weight and BMP  Essential hypertension Elevated blood pressure on presentation.  Has been improved. -Continue to monitor -Continue home antihypertensives  CAD (coronary artery disease) No chest pain and troponin peaked at 334, likely due to demand ischemia.  ACS ruled out -Continue to monitor  Leukocytosis Likely reactive with acute hypoxic failure.  No concern of any infection at this time.  No need for antibiotics Improved. -Continue to monitor  Parkinson's disease (HCC) -Continue home Sinemet   Subjective: Patient was sitting in chair comfortably when seen today.  No new medical concern.  She was talking about her misplaced hearing aid.  Told nurse to try finding it.  Physical Exam: Vitals:   03/09/24 0823 03/09/24 0850 03/09/24 0855 03/09/24 1149  BP: (!) 133/56 (!) 133/56  (!) 113/58  Pulse: 70 70  68  Resp: 18   20  Temp: 98.3 F (36.8 C)   (!) 97.4 F (36.3 C)  TempSrc: Oral  SpO2: 95%  94% 100%  Weight:      Height:       General.  Very hard of hearing elderly lady, in no acute distress. Pulmonary.  Lungs clear bilaterally, normal respiratory effort. CV.  Regular rate and rhythm, no  JVD, rub or murmur. Abdomen.  Soft, nontender, nondistended, BS positive. CNS.  Alert and oriented .  No focal neurologic deficit. Extremities.  No edema, no cyanosis, pulses intact and symmetrical.  Data Reviewed: Prior data reviewed  Family Communication: Discussed with niece on phone  Disposition: Status is: Inpatient Remains inpatient appropriate because: Severity of illness  Planned Discharge Destination: Likely go back to SNF-pending insurance authorization now  DVT prophylaxis.  Subcu heparin Time spent: 44 minutes  This record has been created using Conservation officer, historic buildings. Errors have been sought and corrected,but may not always be located. Such creation errors do not reflect on the standard of care.   Author: Arnetha Courser, MD 03/09/2024 2:22 PM  For on call review [email or web address].

## 2024-03-09 NOTE — Plan of Care (Signed)

## 2024-03-09 NOTE — Progress Notes (Signed)
 Physical Therapy Treatment Patient Details Name: Sheila Long MRN: [id number] DOB: [birth year] Today's Date: 03/09/2024   History of Present Illness Pt is a 89 yo female admitted for CHF and Flu A. Recent admission for similar symptoms including cough and SOB. PMH inclues Parkinson's dx, CAD, GERD, neuropathy, and sarcoidosis.    PT Comments  Patient seated EOB on arrival and wanting to ambulate to bathroom. Ambulated to bathroom with RW and CGA then ambulated 120' in hallway with RW and CGA. SpO2 >95% on RA throughout with 3/4 DOE at end of ambulation. Discharge plan remains appropriate.     If plan is discharge home, recommend the following: A little help with walking and/or transfers;A little help with bathing/dressing/bathroom;Assistance with feeding;Direct supervision/assist for financial management;Assistance with cooking/housework;Assist for transportation;Help with stairs or ramp for entrance;Direct supervision/assist for medications management   Can travel by private vehicle     No  Equipment Recommendations  None recommended by PT    Recommendations for Other Services       Precautions / Restrictions Precautions Precautions: Fall Recall of Precautions/Restrictions: Impaired Restrictions Weight Bearing Restrictions Per Provider Order: No     Mobility  Bed Mobility Overal bed mobility: Needs Assistance Bed Mobility: Sit to Supine       Sit to supine: Mod assist   General bed mobility comments: modA for LE management and repositioning to [facility]    Transfers Overall transfer level: Needs assistance Equipment used: Rolling Zuhayr Deeney (2 wheels) Transfers: Sit to/from Stand Sit to Stand: Contact guard assist                Ambulation/Gait Ambulation/Gait assistance: Contact guard assist Gait Distance (Feet): 15 Feet (+120') Assistive device: Rolling Cienna Dumais (2 wheels) Gait Pattern/deviations: Step-to pattern Gait velocity: decreased     General Gait  Details: on RA, spO2 >95% throughout with 3/4 DOE   Stairs             Wheelchair Mobility     Tilt Bed    Modified Rankin (Stroke Patients Only)       Balance Overall balance assessment: Needs assistance Sitting-balance support: No upper extremity supported, Feet supported Sitting balance-Leahy Scale: Good     Standing balance support: Bilateral upper extremity supported, Reliant on assistive device for balance Standing balance-Leahy Scale: Fair                              Hotel manager: Impaired Factors Affecting Communication: Hearing impaired  Cognition Arousal: Alert Behavior During Therapy: WFL for tasks assessed/performed   PT - Cognitive impairments: No family/caregiver present to determine baseline                         Following commands: Intact Following commands impaired: Follows one step commands with increased time    Cueing    Exercises      General Comments        Pertinent Vitals/Pain Pain Assessment Pain Assessment: No/denies pain    Home Living                          Prior Function            PT Goals (current goals can now be found in the care plan section) Acute Rehab PT Goals Patient Stated Goal: to get stronger PT Goal Formulation: With patient Time For Goal Achievement: 03/21/24 Potential to  Achieve Goals: Good Progress towards PT goals: Progressing toward goals    Frequency    Min 2X/week      PT Plan      Co-evaluation              AM-PAC PT "6 Clicks" Mobility   Outcome Measure  Help needed turning from your back to your side while in a flat bed without using bedrails?: A Lot Help needed moving from lying on your back to sitting on the side of a flat bed without using bedrails?: A Lot Help needed moving to and from a bed to a chair (including a wheelchair)?: A Little Help needed standing up from a chair using your arms (e.g.,  wheelchair or bedside chair)?: A Little Help needed to walk in hospital room?: A Little Help needed climbing 3-5 steps with a railing? : Total 6 Click Score: 14    End of Session   Activity Tolerance: Patient tolerated treatment well Patient left: in bed;with call bell/phone within reach;with bed alarm set Nurse Communication: Mobility status;Precautions;Other (comment) PT Visit Diagnosis: Other abnormalities of gait and mobility (R26.89);Muscle weakness (generalized) (M62.81);Difficulty in walking, not elsewhere classified (R26.2)     Time: 1610-9604 PT Time Calculation (min) (ACUTE ONLY): 15 min  Charges:    $Therapeutic Activity: 8-22 mins PT General Charges $$ ACUTE PT VISIT: 1 Visit                     Maylon Peppers, PT, DPT Physical Therapist - [facility]  [facility]    Theodis Kinsel A Moyses Pavey 03/09/2024, 3:36 PM

## 2024-03-09 NOTE — Plan of Care (Signed)
°  Problem: Health Behavior/Discharge Planning: °Goal: Ability to manage health-related needs will improve °Outcome: Progressing °  °Problem: Clinical Measurements: °Goal: Ability to maintain clinical measurements within normal limits will improve °Outcome: Progressing °  °Problem: Clinical Measurements: °Goal: Respiratory complications will improve °Outcome: Progressing °  °Problem: Clinical Measurements: °Goal: Cardiovascular complication will be avoided °Outcome: Progressing °  °

## 2024-03-10 DIAGNOSIS — I10 Essential (primary) hypertension: Secondary | ICD-10-CM | POA: Diagnosis not present

## 2024-03-10 DIAGNOSIS — J9601 Acute respiratory failure with hypoxia: Secondary | ICD-10-CM | POA: Diagnosis not present

## 2024-03-10 DIAGNOSIS — I2510 Atherosclerotic heart disease of native coronary artery without angina pectoris: Secondary | ICD-10-CM | POA: Diagnosis not present

## 2024-03-10 DIAGNOSIS — I5033 Acute on chronic diastolic (congestive) heart failure: Secondary | ICD-10-CM | POA: Diagnosis not present

## 2024-03-10 LAB — BASIC METABOLIC PANEL
Anion gap: 7 (ref 5–15)
BUN: 20 mg/dL (ref 8–23)
CO2: 29 mmol/L (ref 22–32)
Calcium: 8.6 mg/dL — ABNORMAL LOW (ref 8.9–10.3)
Chloride: 103 mmol/L (ref 98–111)
Creatinine, Ser: 0.93 mg/dL (ref 0.44–1.00)
GFR, Estimated: 56 mL/min — ABNORMAL LOW (ref >60)
Glucose, Bld: 100 mg/dL — ABNORMAL HIGH (ref 70–99)
Potassium: 3.8 mmol/L (ref 3.5–5.1)
Sodium: 139 mmol/L (ref 135–145)

## 2024-03-10 LAB — CBC
HCT: 31.2 % — ABNORMAL LOW (ref 36.0–46.0)
Hemoglobin: 10.6 g/dL — ABNORMAL LOW (ref 12.0–15.0)
MCH: 34.4 pg — ABNORMAL HIGH (ref 26.0–34.0)
MCHC: 34 g/dL (ref 30.0–36.0)
MCV: 101.3 fL — ABNORMAL HIGH (ref 80.0–100.0)
Platelets: 203 10*3/uL (ref 150–400)
RBC: 3.08 MIL/uL — ABNORMAL LOW (ref 3.87–5.11)
RDW: 14.1 % (ref 11.5–15.5)
WBC: 6.2 10*3/uL (ref 4.0–10.5)
nRBC: 0 % (ref 0.0–0.2)

## 2024-03-10 MED ORDER — IPRATROPIUM-ALBUTEROL 0.5-2.5 (3) MG/3ML IN SOLN: 3.0000 mL | Freq: Four times a day (QID) | RESPIRATORY_TRACT | Status: DC | PRN

## 2024-03-10 NOTE — TOC Progression Note (Signed)
 Transition of Care [facility]) - Progression Note    Patient Details  Name: Sheila Long MRN: [id number] Date of Birth: 1924/12/03  [facility]) CM/SW Contact  Garret Reddish, RN Phone Number: 03/10/2024, 3:50 PM  Clinical Narrative:    Chart reviewed.  Peer to Peer has been completed by provider and SNF has been declined.  I have spoken with patient's niece Leona Carry.  I have discussed why SNF referral was declined by the insurance.  I have made Bonita Quin aware that patient is walking 135 feet with CGA with RW.    I have spoken with Bonita Quin about Long-term care placement, Assisted-Living placement options, in home private duty care.  I have encouraged Bonita Quin to apply for Medicaid at DSS to see if patient would qualify for Medicaid to assist with the cost of long-term care.  I have explained the difference between STR, Long-term care, Assisted-Living, [facility] services, and private duty nursing.    Bonita Quin reports that she and her mother plan on coming to the hospital on tomorrow to speak with Mrs. Clinton Sawyer about discharge.    TOC will continue to follow for discharge planning.     Expected Discharge Plan: Skilled Nursing Facility Barriers to Discharge: Continued Medical Work up  Expected Discharge Plan and Services                                               Social Determinants of Health (SDOH) Interventions SDOH Screenings   Food Insecurity: No Food Insecurity (03/07/2024)  Housing: Low Risk  (03/07/2024)  Transportation Needs: No Transportation Needs (03/07/2024)  Utilities: Not At Risk (03/07/2024)  Alcohol Screen: Low Risk  (10/11/2021)  Depression (PHQ2-9): Low Risk  (10/11/2021)  Financial Resource Strain: Low Risk  (09/02/2023)   Received from [facility]  Physical Activity: Inactive (10/11/2021)  Social Connections: Moderately Integrated (03/07/2024)  Stress: No Stress Concern Present (10/11/2021)  Tobacco Use: Low Risk  (02/14/2024)     Readmission Risk Interventions     No data to display

## 2024-03-10 NOTE — Progress Notes (Signed)
 Progress Note   [name] MVH:[id number] DOB: 1924-11-26 DOA: 03/06/2024     3 DOS: the patient was seen and examined on 03/10/2024   Brief hospital course: Taken from H&P.   Kyndell Zeiser is a 89 y.o. female with medical history significant of Parkinson's disease, chronic HFpEF, CAD, GERD, neuropathy, sarcoidosis, chronic ambulatory impairment, presented with worsening of cough and shortness of breath.   Patient was recently hospitalized for influenza A pneumonia and stabilized and discharged to SNF.  This time, patient started to develop productive cough with clear phlegm about 3 to 4 days ago, gradually getting worse and started develop increasing exertional dyspnea.  On presentation patient was found to have elevated blood pressure and initially placed on BiPAP due to increased work of breathing.  Labs with leukocytosis at 15.3, VBG 7.28/61/56.  Chest x-ray with bilateral pulmonary congestion.  Patient received 1 dose of labetalol and started on IV diuresis.  3/9: Vital stable, she was on 40 L with heated high flow and saturating 100%-able to wean to 4 L pretty quickly.  No baseline oxygen use. BNP 358, troponin elevated which peaked at 334-likely demand ischemia.  Leukocytosis resolved.  Macrocytosis.  Procalcitonin at 0.16, MRSA PCR negative, respiratory panel negative.  Echocardiogram pended Will continue with IV diuresis as labs seems stable.  3/10: Hemodynamically stable, able to wean back to room air.  Slight increase in creatinine today so holding further IV Lasix and we can start her on p.o. diuretic from tomorrow.  Pending echocardiogram.  Pending insurance authorization for going back to rehab.  3/11: Hemodynamically stable.  Echocardiogram with normal EF and grade 2 diastolic dysfunction.  No other significant abnormality.  Renal function back to baseline, started on low-dose torsemide. Pended insurance authorization for rehab.  3/12: Remained  hemodynamically stable, insurance denied rehab even with peer to peer stating that she is at baseline and already use her Medicare days.  Apparently she has refused going to assisted living as she does not want to give of her home.  Talk with niece and they are coming up with some plan to help her as she lives alone.  Ordered maximum home health services and hoping that she can go home by tomorrow.  Assessment and Plan: * Acute hypoxic respiratory failure (HCC) Likely due to acute on chronic HFpEF.  Elevated BNP and chest x-ray with concern of pulmonary edema, which seems improving on repeat chest x-ray this morning. Initially required BiPAP followed by heated high flow and now weaned back to room air.  No baseline oxygen use Patient has recent influenza A, MRSA PCR and repeat respiratory panel negative, less likely pneumonia. -Weaned to room air.  Acute on chronic congestive heart failure (HCC) Echocardiogram done in June 2023 with low normal EF and grade 1 diastolic dysfunction. Repeat Echo with normal EF and and grade 2 diastolic dysfunction IV diuresis was held yesterday due to increasing creatinine which improved today so starting on low-dose torsemide daily -Strict intake and output -Daily weight and BMP  Essential hypertension Elevated blood pressure on presentation.  Has been improved. -Continue to monitor -Continue home antihypertensives  CAD (coronary artery disease) No chest pain and troponin peaked at 334, likely due to demand ischemia.  ACS ruled out -Continue to monitor  Leukocytosis Likely reactive with acute hypoxic failure.  No concern of any infection at this time.  No need for antibiotics Improved. -Continue to monitor  Parkinson's disease (HCC) -Continue home Sinemet   Subjective: Patient was seen and  examined today.  No new concern.  She wants to go home.  Physical Exam: Vitals:   03/10/24 0443 03/10/24 0458 03/10/24 0737 03/10/24 0901  BP:  (!) 142/58 (!)  147/61   Pulse:  74 76   Resp:  18 17   Temp:  98 F (36.7 C) 98.7 F (37.1 C)   TempSrc:      SpO2:  96% 91% 94%  Weight: 88 kg     Height:       General.  Frail and very hard of hearing elderly lady, in no acute distress. Pulmonary.  Lungs clear bilaterally, normal respiratory effort. CV.  Regular rate and rhythm, no JVD, rub or murmur. Abdomen.  Soft, nontender, nondistended, BS positive. CNS.  Alert and oriented .  No focal neurologic deficit. Extremities.  No edema, no cyanosis, pulses intact and symmetrical.  Data Reviewed: Prior data reviewed  Family Communication: Discussed with niece on phone  Disposition: Status is: Inpatient Remains inpatient appropriate because: Severity of illness  Planned Discharge Destination: Likely go back to SNF-pending insurance authorization now  DVT prophylaxis.  Subcu heparin Time spent: 45 minutes  This record has been created using Conservation officer, historic buildings. Errors have been sought and corrected,but may not always be located. Such creation errors do not reflect on the standard of care.   Author: Arnetha Courser, MD 03/10/2024 1:42 PM  For on call review [email or web address].

## 2024-03-10 NOTE — Plan of Care (Signed)
  Problem: Education: Goal: Knowledge of General Education information will improve Description: Including pain rating scale, medication(s)/side effects and non-pharmacologic comfort measures Outcome: Progressing   Problem: Health Behavior/Discharge Planning: Goal: Ability to manage health-related needs will improve Outcome: Progressing   Problem: Clinical Measurements: Goal: Ability to maintain clinical measurements within normal limits will improve Outcome: Progressing Goal: Will remain free from infection Outcome: Progressing Goal: Diagnostic test results will improve Outcome: Progressing Goal: Respiratory complications will improve Outcome: Progressing Goal: Cardiovascular complication will be avoided Outcome: Progressing   Problem: Activity: Goal: Risk for activity intolerance will decrease Outcome: Progressing   Problem: Coping: Goal: Level of anxiety will decrease Outcome: Progressing   Problem: Elimination: Goal: Will not experience complications related to bowel motility Outcome: Progressing Goal: Will not experience complications related to urinary retention Outcome: Progressing   Problem: Pain Managment: Goal: General experience of comfort will improve and/or be controlled Outcome: Progressing

## 2024-03-10 NOTE — Progress Notes (Signed)
 Occupational Therapy Treatment Patient Details Name: Sheila Long MRN: [id number] DOB: August 18, 1924 Today's Date: 03/10/2024   History of present illness Pt is a 89 yo female admitted for CHF and Flu A. Recent admission for similar symptoms including cough and SOB. PMH inclues Parkinson's dx, CAD, GERD, neuropathy, and sarcoidosis.   OT comments  Pt is supine in bed on arrival. Pleasant and agreeable to OT session. She endorses pain to her R lateral thigh region that is minimal. Pt performed bed mobility with CGA via HHA, Min/CGA for STS from EOB to RW with increased  time/effort, CGA for ~100 feet of mobility with no LOB, slow pace. Mod A for toilet transfer from low toilet, CGA for anterior peri-care and hand hygiene in standing. Pt required CGA for STS from recliner.  Pt left in recliner with all needs in place and will cont to require skilled acute OT services to maximize her safety and IND to return to PLOF.       If plan is discharge home, recommend the following:  A little help with walking and/or transfers;A little help with bathing/dressing/bathroom;Assistance with cooking/housework;Assistance with feeding;Direct supervision/assist for medications management;Direct supervision/assist for financial management;Assist for transportation;Help with stairs or ramp for entrance;Supervision due to cognitive status   Equipment Recommendations  Other (comment) (defer)    Recommendations for Other Services      Precautions / Restrictions Precautions Precautions: Fall Recall of Precautions/Restrictions: Impaired Restrictions Weight Bearing Restrictions Per Provider Order: No       Mobility Bed Mobility Overal bed mobility: Needs Assistance Bed Mobility: Supine to Sit     Supine to sit: Contact guard, HOB elevated, Used rails     General bed mobility comments: HHA but pt pulling on therpist as if it were bed rail    Transfers Overall transfer level: Needs  assistance Equipment used: Rolling walker (2 wheels) Transfers: Sit to/from Stand Sit to Stand: Contact guard assist, Mod assist           General transfer comment: CGA with increased time/effort to stand from EOB at lowest height, Mod A from low toilet; CGA for ~100 feet mobility using RW with sp02 96% with return to recliner     Balance Overall balance assessment: Needs assistance Sitting-balance support: No upper extremity supported, Feet supported Sitting balance-Leahy Scale: Good Sitting balance - Comments: able to reach down to adjust socks with no LOB while seated EOB   Standing balance support: Bilateral upper extremity supported, Reliant on assistive device for balance Standing balance-Leahy Scale: Fair Standing balance comment: RW use and CGA                           ADL either performed or assessed with clinical judgement   ADL Overall ADL's : Needs assistance/impaired     Grooming: Wash/dry hands;Contact guard assist;Standing Grooming Details (indicate cue type and reason): at RW at sink with cueing for location of soap                 Toilet Transfer: Rolling walker (2 wheels);Regular Toilet;Grab bars;Moderate assistance Toilet Transfer Details (indicate cue type and reason): Mod A for STS from low toilet using grab bar Toileting- Clothing Manipulation and Hygiene: Contact guard assist;Sit to/from stand Toileting - Clothing Manipulation Details (indicate cue type and reason): anterior hygiene in standing            Extremity/Trunk Assessment  Vision       Perception     Praxis     Communication Communication Communication: Impaired Factors Affecting Communication: Hearing impaired   Cognition Arousal: Alert Behavior During Therapy: WFL for tasks assessed/performed                                 Following commands: Intact Following commands impaired: Follows one step commands with increased time       Cueing      Exercises      Shoulder Instructions       General Comments 96% on RA after mobility    Pertinent Vitals/ Pain       Pain Assessment Pain Assessment: Faces Faces Pain Scale: Hurts a little bit Pain Location: R lateral distal thigh Pain Descriptors / Indicators: Discomfort Pain Intervention(s): Monitored during session, Repositioned  Home Living                                          Prior Functioning/Environment              Frequency  Min 2X/week        Progress Toward Goals  OT Goals(current goals can now be found in the care plan section)  Progress towards OT goals: Progressing toward goals  Acute Rehab OT Goals Patient Stated Goal: get better OT Goal Formulation: With patient Time For Goal Achievement: 03/22/24 Potential to Achieve Goals: Good  Plan      Co-evaluation                 AM-PAC OT "6 Clicks" Daily Activity     Outcome Measure   Help from another person eating meals?: None Help from another person taking care of personal grooming?: A Little Help from another person toileting, which includes using toliet, bedpan, or urinal?: A Little Help from another person bathing (including washing, rinsing, drying)?: A Lot Help from another person to put on and taking off regular upper body clothing?: A Little Help from another person to put on and taking off regular lower body clothing?: A Lot 6 Click Score: 17    End of Session Equipment Utilized During Treatment: Rolling walker (2 wheels)  OT Visit Diagnosis: Unsteadiness on feet (R26.81);Muscle weakness (generalized) (M62.81);Other abnormalities of gait and mobility (R26.89)   Activity Tolerance Patient tolerated treatment well   Patient Left with call bell/phone within reach;in chair;with chair alarm set   Nurse Communication Mobility status        Time: 1610-9604 OT Time Calculation (min): 29 min  Charges: OT General Charges $OT Visit:  1 Visit OT Treatments $Self Care/Home Management : 8-22 mins $Therapeutic Activity: 8-22 mins  Sailor Haughn, OTR/L  03/10/24, 3:20 PM  Labrisha Wuellner E Shyna Duignan 03/10/2024, 3:18 PM

## 2024-03-10 NOTE — TOC Progression Note (Signed)
 Transition of Care [facility]) - Progression Note    Patient Details  Name: Sheila Long MRN: [id number] Date of Birth: 04-Dec-1924  [facility] CM/SW Contact  Garret Reddish, RN Phone Number: 03/10/2024, 9:11 AM  Clinical Narrative:    Chart reviewed.  Spoke with Junious Dresser, Nurse reviewer at [facility].  She informs me that patient has been denied for SNF placement.  She informs me that the insurance company will offer a peer to peer.  Peer to peer information is :  Dr. Logan Bores [phone] Peer to peer to be completed by 12 noon today.    Insurance company has also denied ambulance transport as patient is mobile and walking 135 feet CGA.    I have made Dr. Nelson Chimes of the above information.    TOC will continue to follow for discharge planning.     Expected Discharge Plan: Skilled Nursing Facility Barriers to Discharge: Continued Medical Work up  Expected Discharge Plan and Services                                               Social Determinants of Health (SDOH) Interventions SDOH Screenings   Food Insecurity: No Food Insecurity (03/07/2024)  Housing: Low Risk  (03/07/2024)  Transportation Needs: No Transportation Needs (03/07/2024)  Utilities: Not At Risk (03/07/2024)  Alcohol Screen: Low Risk  (10/11/2021)  Depression (PHQ2-9): Low Risk  (10/11/2021)  Financial Resource Strain: Low Risk  (09/02/2023)   Received from [facility]  Physical Activity: Inactive (10/11/2021)  Social Connections: Moderately Integrated (03/07/2024)  Stress: No Stress Concern Present (10/11/2021)  Tobacco Use: Low Risk  (02/14/2024)    Readmission Risk Interventions     No data to display

## 2024-03-11 ENCOUNTER — Other Ambulatory Visit: Payer: Self-pay

## 2024-03-11 DIAGNOSIS — J9601 Acute respiratory failure with hypoxia: Secondary | ICD-10-CM | POA: Diagnosis not present

## 2024-03-11 DIAGNOSIS — I10 Essential (primary) hypertension: Secondary | ICD-10-CM | POA: Diagnosis not present

## 2024-03-11 DIAGNOSIS — I5033 Acute on chronic diastolic (congestive) heart failure: Secondary | ICD-10-CM | POA: Diagnosis not present

## 2024-03-11 DIAGNOSIS — D72829 Elevated white blood cell count, unspecified: Secondary | ICD-10-CM | POA: Diagnosis not present

## 2024-03-11 MED ORDER — TORSEMIDE 20 MG PO TABS
20.0000 mg | ORAL_TABLET | Freq: Every day | ORAL | 1 refills | Status: DC
  Filled 2024-03-11: qty 30, 30d supply, fill #0

## 2024-03-11 MED ORDER — POLYETHYLENE GLYCOL 3350 17 GM/SCOOP PO POWD
17.0000 g | Freq: Every day | ORAL | 0 refills | Status: AC | PRN
Start: 2024-03-11 — End: ?
  Filled 2024-03-11: qty 238, 14d supply, fill #0

## 2024-03-11 MED ORDER — DOCUSATE SODIUM 100 MG PO CAPS
100.0000 mg | ORAL_CAPSULE | Freq: Two times a day (BID) | ORAL | 0 refills | Status: DC
  Filled 2024-03-11: qty 60, 30d supply, fill #0

## 2024-03-11 NOTE — TOC Progression Note (Signed)
 Transition of Care [facility]) - Progression Note    Patient Details  Name: Sheila Long MRN: [id number] Date of Birth: March 30, 1924  Transition of Care [facility]) CM/SW Contact  Erin Sons, [place] Phone Number: 03/11/2024, 1:15 PM  Clinical Narrative:     CSW met with pt's sister and niece bedside. Discussed Home services available; they are agreeable to CSW arranging [facility] PT/OT/RN/Aide. Pt has a walker at home. They would like a 3in 1 ordered. Discussed private duty care which they are interested in. CSW sent referral to [facility]; they will follow up with family to discuss arrangement of private duty care services.   HH arranged with [facility] 3in1 ordered from [employer].  Referral faxed to Care patrol  Expected Discharge Plan: Home w Home Health Services Barriers to Discharge: No Barriers Identified                     Social Determinants of Health (SDOH) Interventions SDOH Screenings   Food Insecurity: No Food Insecurity (03/07/2024)  Housing: Low Risk  (03/07/2024)  Transportation Needs: No Transportation Needs (03/07/2024)  Utilities: Not At Risk (03/07/2024)  Alcohol Screen: Low Risk  (10/11/2021)  Depression (PHQ2-9): Low Risk  (10/11/2021)  Financial Resource Strain: Low Risk  (09/02/2023)   Received from [facility]  Physical Activity: Inactive (10/11/2021)  Social Connections: Moderately Integrated (03/07/2024)  Stress: No Stress Concern Present (10/11/2021)  Tobacco Use: Low Risk  (02/14/2024)    Readmission Risk Interventions     No data to display

## 2024-03-11 NOTE — Discharge Summary (Signed)
 Physician Discharge Summary   [name] MRN: [id number] DOB: [birth year]  Admit date:     03/06/2024  Discharge date: 03/11/24  Discharge Physician: Arnetha Courser   PCP: Enid Baas, MD   Recommendations at discharge:  Please obtain CBC and BMP on follow-up Follow-up with primary care provider within a week  Discharge Diagnoses: Principal Problem:   Acute hypoxic respiratory failure (HCC) Active Problems:   Acute on chronic congestive heart failure [facility])   Essential hypertension   CAD (coronary artery disease)   Leukocytosis   Parkinson's disease [name])   Hospital Course: Taken from H&P.   Sheila Long is a 89 y.o. female with medical history significant of Parkinson's disease, chronic HFpEF, CAD, GERD, neuropathy, sarcoidosis, chronic ambulatory impairment, presented with worsening of cough and shortness of breath.   Patient was recently hospitalized for influenza A pneumonia and stabilized and discharged to SNF.  This time, patient started to develop productive cough with clear phlegm about 3 to 4 days ago, gradually getting worse and started develop increasing exertional dyspnea.  On presentation patient was found to have elevated blood pressure and initially placed on BiPAP due to increased work of breathing.  Labs with leukocytosis at 15.3, VBG 7.28/61/56.  Chest x-ray with bilateral pulmonary congestion.  Patient received 1 dose of labetalol and started on IV diuresis.  3/9: Vital stable, she was on 40 L with heated high flow and saturating 100%-able to wean to 4 L pretty quickly.  No baseline oxygen use. BNP 358, troponin elevated which peaked at 334-likely demand ischemia.  Leukocytosis resolved.  Macrocytosis.  Procalcitonin at 0.16, MRSA PCR negative, respiratory panel negative.  Echocardiogram pended Will continue with IV diuresis as labs seems stable.  3/10: Hemodynamically stable, able to wean back to room air.  Slight increase in  creatinine today so holding further IV Lasix and we can start her on p.o. diuretic from tomorrow.  Pending echocardiogram.  Pending insurance authorization for going back to rehab.  3/11: Hemodynamically stable.  Echocardiogram with normal EF and grade 2 diastolic dysfunction.  No other significant abnormality.  Renal function back to baseline, started on low-dose torsemide. Pended insurance authorization for rehab.  3/12: Remained hemodynamically stable, insurance denied rehab even with peer to peer stating that she is at baseline and already use her Medicare days.  Apparently she has refused going to assisted living as she does not want to give of her home.  Talk with niece and they are coming up with some plan to help her as she lives alone.  Ordered [facility] and hoping that she can go home by tomorrow.  3/13: Patient remained hemodynamically stable.  [facility] were arranged and patient is being discharged home with family. She was also provided with private pay home care agencies information with the family can contact to hire someone to provide extra care whatever needed after getting home health services through Medicare.  Her home Lasix was discontinued and instead she will take low-dose home torsemide.  She will current the rest of her home medications and need to have a close follow-up with her providers for further management.  Assessment and Plan: * Acute hypoxic respiratory failure (HCC) Likely due to acute on chronic HFpEF.  Elevated BNP and chest x-ray with concern of pulmonary edema, which seems improving on repeat chest x-ray this morning. Initially required BiPAP followed by heated high flow and now weaned back to room air.  No baseline oxygen use Patient  has recent influenza A, MRSA PCR and repeat respiratory panel negative, less likely pneumonia. -Weaned to room air.  Acute on chronic congestive heart failure (HCC) Echocardiogram done in  June 2023 with low normal EF and grade 1 diastolic dysfunction. Repeat Echo with normal EF and and grade 2 diastolic dysfunction IV diuresis was held yesterday due to increasing creatinine which improved today so starting on low-dose torsemide daily -Strict intake and output -Daily weight and BMP  Essential hypertension Elevated blood pressure on presentation.  Has been improved. -Continue to monitor -Continue home antihypertensives  CAD (coronary artery disease) No chest pain and troponin peaked at 334, likely due to demand ischemia.  ACS ruled out -Continue to monitor  Leukocytosis Likely reactive with acute hypoxic failure.  No concern of any infection at this time.  No need for antibiotics Improved. -Continue to monitor  Parkinson's disease (HCC) -Continue home Sinemet   Consultants: None Procedures performed: None Disposition: Home health Diet recommendation:  Discharge Diet Orders (From admission, onward)     Start     Ordered   03/11/24 0000  Diet - low sodium heart healthy        03/11/24 1346           Cardiac diet DISCHARGE MEDICATION: Allergies as of 03/11/2024       Reactions   Tramadol Other (See Comments)   CONFUSION   Sulfa Antibiotics    Patient does not recall   Zocor [simvastatin] Other (See Comments)   INTOLERANCE-MYALGIAS        Medication List     STOP taking these medications    furosemide 20 MG tablet Commonly known as: LASIX   glycopyrrolate 1 MG tablet Commonly known as: ROBINUL   pantoprazole 40 MG tablet Commonly known as: PROTONIX       TAKE these medications    carbidopa-levodopa 25-100 MG tablet Commonly known as: SINEMET IR Take 1 tablet by mouth at bedtime for 7 days.   docusate sodium 100 MG capsule Commonly known as: COLACE Take 1 capsule (100 mg total) by mouth 2 (two) times daily.   escitalopram 5 MG tablet Commonly known as: LEXAPRO Take 1 tablet (5 mg total) by mouth daily for 7 days.   FeroSul  325 (65 Fe) MG tablet Generic drug: ferrous sulfate Take 1 tablet (325 mg total) by mouth 2 (two) times daily for 7 days.   latanoprost 0.005 % ophthalmic solution Commonly known as: XALATAN Place 1 drop into both eyes at bedtime for 7 days.   lisinopril 20 MG tablet Commonly known as: ZESTRIL Take 1 tablet (20 mg total) by mouth daily for 7 days.   metoprolol succinate 100 MG 24 hr tablet Commonly known as: TOPROL-XL Take 1 tablet (100 mg total) by mouth daily for 7 days. Take with or immediately following a meal.   polyethylene glycol 17 g packet Commonly known as: MiraLax Take 17 g by mouth daily as needed for mild constipation or moderate constipation.   torsemide 20 MG tablet Commonly known as: DEMADEX Take 1 tablet (20 mg total) by mouth daily. Start taking on: March 12, 2024               Durable Medical Equipment  (From admission, onward)           Start     Ordered   03/11/24 1341  For home use only DME 3 n 1  Once        03/11/24 1340  Follow-up Information     [facility] Follow up.   Specialty: Home Health Services Why: Home Health PT/OT/RN/Aide arranged with Angela Burke information: [address] [place] [address] [phone]         [employer] Call.   Why: [employer] can assist with arranging private duty care at home or assisted living facilities. Contact information: [phone]        Enid Baas, MD Follow up.   Specialty: Internal Medicine Why: Hospital follow up Contact information: [address] [place] [address] [phone]         Enid Baas, MD .   Specialty: Internal Medicine Contact information: [address] [place] [place] [address] [phone]                Discharge Exam: Ceasar Mons Weights   03/09/24 0500 03/10/24 0443 03/11/24 0354  Weight: 85.8 kg 88 kg 84.9 kg   General.  Frail and hard of hearing elderly  lady, in no acute distress. Pulmonary.  Lungs clear bilaterally, normal respiratory effort. CV.  Regular rate and rhythm, no JVD, rub or murmur. Abdomen.  Soft, nontender, nondistended, BS positive. CNS.  Alert and oriented .  No focal neurologic deficit. Extremities.  No edema, no cyanosis, pulses intact and symmetrical.   Condition at discharge: stable  The results of significant diagnostics from this hospitalization (including imaging, microbiology, ancillary and laboratory) are listed below for reference.   Imaging Studies: ECHOCARDIOGRAM COMPLETE Result Date: 03/09/2024    ECHOCARDIOGRAM REPORT   Patient Name:   CHAELYN BUNYAN Date of Exam: 03/08/2024 Medical Rec #:  [id number]            Height:       64.0 in Accession #:    [id number]           Weight:       189.4 lb Date of Birth:  [birth year]            BSA:          1.912 m Patient Age:    89 years             BP:           130/58 mmHg Patient Gender: F                    HR:           73 bpm. Exam Location:  [facility] Procedure: 2D Echo, Cardiac Doppler and Color Doppler (Both Spectral and Color            Flow Doppler were utilized during procedure). Indications:     CHF-acute diastolic I50.31  History:         Patient has prior history of Echocardiogram examinations, most                  recent 06/02/2022. CHF; Risk Factors:Hypertension.  Sonographer:     Cristela Blue Referring Phys:  [id number] Emeline General Diagnosing Phys: Rozell Searing Custovic IMPRESSIONS  1. Left ventricular ejection fraction, by estimation, is 60 to 65%. The left ventricle has normal function. The left ventricle has no regional wall motion abnormalities. Left ventricular diastolic parameters are consistent with Grade II diastolic dysfunction (pseudonormalization).  2. Right ventricular systolic function is normal. The right ventricular size is normal.  3. Left atrial size was mildly dilated.  4. The mitral valve is degenerative. Severe mitral valve regurgitation. No evidence of  mitral stenosis.  5. Tricuspid valve regurgitation is mild to moderate.  6. The aortic valve is normal in structure. Aortic valve regurgitation is mild. No aortic stenosis is present.  7. The inferior vena cava is normal in size with greater than 50% respiratory variability, suggesting right atrial pressure of 3 mmHg. FINDINGS  Left Ventricle: Left ventricular ejection fraction, by estimation, is 60 to 65%. The left ventricle has normal function. The left ventricle has no regional wall motion abnormalities. The left ventricular internal cavity size was normal in size. There is  no left ventricular hypertrophy. Left ventricular diastolic parameters are consistent with Grade II diastolic dysfunction (pseudonormalization). Right Ventricle: The right ventricular size is normal. No increase in right ventricular wall thickness. Right ventricular systolic function is normal. Left Atrium: Left atrial size was mildly dilated. Right Atrium: Right atrial size was normal in size. Pericardium: There is no evidence of pericardial effusion. Mitral Valve: The mitral valve is degenerative in appearance. Severe mitral valve regurgitation. No evidence of mitral valve stenosis. MV peak gradient, 9.0 mmHg. The mean mitral valve gradient is 4.0 mmHg. Tricuspid Valve: The tricuspid valve is normal in structure. Tricuspid valve regurgitation is mild to moderate. Aortic Valve: The aortic valve is normal in structure. Aortic valve regurgitation is mild. No aortic stenosis is present. Aortic valve mean gradient measures 4.0 mmHg. Aortic valve peak gradient measures 7.1 mmHg. Aortic valve area, by VTI measures 2.00 cm. Pulmonic Valve: The pulmonic valve was normal in structure. Pulmonic valve regurgitation is mild. Aorta: The aortic root is normal in size and structure. Venous: The inferior vena cava is normal in size with greater than 50% respiratory variability, suggesting right atrial pressure of 3 mmHg. IAS/Shunts: No atrial level shunt  detected by color flow Doppler.  LEFT VENTRICLE PLAX 2D LVIDd:         5.00 cm   Diastology LVIDs:         2.80 cm   LV e' medial:    6.53 cm/s LV PW:         1.40 cm   LV E/e' medial:  18.8 LV IVS:        1.30 cm   LV e' lateral:   11.10 cm/s LVOT diam:     1.90 cm   LV E/e' lateral: 11.1 LV SV:         56 LV SV Index:   29 LVOT Area:     2.84 cm  RIGHT VENTRICLE RV Basal diam:  3.40 cm RV Mid diam:    2.70 cm RV S prime:     15.00 cm/s TAPSE (M-mode): 2.0 cm LEFT ATRIUM             Index        RIGHT ATRIUM           Index LA diam:        3.60 cm 1.88 cm/m   RA Area:     15.90 cm LA Vol (A2C):   49.4 ml 25.84 ml/m  RA Volume:   36.50 ml  19.09 ml/m LA Vol (A4C):   51.1 ml 26.73 ml/m LA Biplane Vol: 52.8 ml 27.62 ml/m  AORTIC VALVE                    PULMONIC VALVE AV Area (Vmax):    1.67 cm     PR End Diast Vel: 4.24 msec AV Area (Vmean):   1.72 cm AV Area (VTI):     2.00 cm AV  Vmax:           133.00 cm/s AV Vmean:          89.750 cm/s AV VTI:            0.278 m AV Peak Grad:      7.1 mmHg AV Mean Grad:      4.0 mmHg LVOT Vmax:         78.20 cm/s LVOT Vmean:        54.300 cm/s LVOT VTI:          0.196 m LVOT/AV VTI ratio: 0.71  AORTA Ao Root diam: 3.10 cm MITRAL VALVE                  TRICUSPID VALVE MV Area (PHT): 3.81 cm       TR Peak grad:   38.9 mmHg MV Area VTI:   1.48 cm       TR Vmax:        312.00 cm/s MV Peak grad:  9.0 mmHg MV Mean grad:  4.0 mmHg       SHUNTS MV Vmax:       1.50 m/s       Systemic VTI:  0.20 m MV Vmean:      89.2 cm/s      Systemic Diam: 1.90 cm MV Decel Time: 199 msec MR Peak grad:    149.8 mmHg MR Mean grad:    96.0 mmHg MR Vmax:         612.00 cm/s MR Vmean:        465.0 cm/s MR PISA:         1.01 cm MR PISA Eff ROA: 5 mm MR PISA Radius:  0.40 cm MV E velocity: 123.00 cm/s MV A velocity: 89.10 cm/s MV E/A ratio:  1.38 Designer, multimedia signed by Clotilde Dieter Signature Date/Time: 03/09/2024/8:39:06 AM    Final    DG Chest 1 View Result Date:  03/07/2024 CLINICAL DATA:  02725 CHF (congestive heart failure) (HCC) 36644 EXAM: CHEST  1 VIEW COMPARISON:  Prior chest x-ray 03/06/2024 FINDINGS: Stable cardiomegaly. Atherosclerotic calcifications throughout the transverse aorta. Persistent haziness of the left lung base consistent with a small chronic effusion and associated atelectasis. Background pulmonary vascular congestion is present without overt edema. Improved aeration in the right lung base compared to yesterday. No pneumothorax. No acute osseous abnormality. IMPRESSION: 1. Overall improved aeration with decreasing pulmonary edema. 2. Small left pleural effusion with associated left basilar atelectasis. Electronically Signed   By: Malachy Moan M.D.   On: 03/07/2024 08:08   DG Chest Portable 1 View Result Date: 03/06/2024 CLINICAL DATA:  resp distress EXAM: PORTABLE CHEST 1 VIEW COMPARISON:  February 12, 2024 FINDINGS: The cardiomediastinal silhouette is unchanged in contour. Small LEFT pleural effusion. Trace RIGHT pleural effusion. No pneumothorax. Increased peribronchial cuffing and bibasilar predominant interstitial opacities. LEFT retrocardiac heterogeneous airspace opacity. Atherosclerotic calcifications. Chronic appearing deformity of the RIGHT acromioclavicular joint. IMPRESSION: 1. Small LEFT and trace RIGHT pleural effusions. 2. Increased peribronchial cuffing and bibasilar predominant interstitial opacities. Differential considerations include pulmonary edema versus atypical infection. 3. LEFT retrocardiac heterogeneous airspace opacity could reflect atelectasis versus infection. Electronically Signed   By: Meda Klinefelter M.D.   On: 03/06/2024 08:31   DG Chest 2 View Result Date: 02/12/2024 CLINICAL DATA:  [name].  Cough and congestion. EXAM: CHEST - 2 VIEW COMPARISON:  Chest radiograph dated 01/06/2023. FINDINGS: Stable mild cardiomegaly. Aortic atherosclerosis. No focal consolidation, pleural effusion,  or  pneumothorax. No acute osseous abnormality. IMPRESSION: 1. No acute cardiopulmonary findings. 2. Stable mild cardiomegaly. Electronically Signed   By: Hart Robinsons M.D.   On: 02/12/2024 09:36    Microbiology: Results for orders placed or performed during the hospital encounter of 03/06/24  MRSA Next Gen by PCR, Nasal     Status: None   Collection Time: 03/07/24  9:33 AM   Specimen: Nasal Mucosa; Nasal Swab  Result Value Ref Range Status   MRSA by PCR Next Gen NOT DETECTED NOT DETECTED Final    Comment: (NOTE) The GeneXpert MRSA Assay (FDA approved for NASAL specimens only), is one component of a comprehensive MRSA colonization surveillance program. It is not intended to diagnose MRSA infection nor to guide or monitor treatment for MRSA infections. Test performance is not FDA approved in patients less than 42 years old. Performed at [facility], [address] [place], [place] [address]   Resp panel by RT-PCR (RSV, Flu A&B, Covid) Nasal Mucosa     Status: None   Collection Time: 03/07/24  9:33 AM   Specimen: Nasal Mucosa; Nasal Swab  Result Value Ref Range Status   SARS Coronavirus 2 by RT PCR NEGATIVE NEGATIVE Final    Comment: (NOTE) SARS-CoV-2 target nucleic acids are NOT DETECTED.  The SARS-CoV-2 RNA is generally detectable in upper respiratory specimens during the acute phase of infection. The lowest concentration of SARS-CoV-2 viral copies this assay can detect is 138 copies/mL. A negative result does not preclude SARS-Cov-2 infection and should not be used as the sole basis for treatment or other patient management decisions. A negative result may occur with  improper specimen collection/handling, submission of specimen other than nasopharyngeal swab, presence of viral mutation(s) within the areas targeted by this assay, and inadequate number of viral copies(<138 copies/mL). A negative result must be combined with clinical observations, patient history, and  epidemiological information. The expected result is Negative.  Fact Sheet for Patients:  BloggerCourse.com  Fact Sheet for Healthcare Providers:  SeriousBroker.it  This test is no t yet approved or cleared by the [place] FDA and  has been authorized for detection and/or diagnosis of SARS-CoV-2 by FDA under an Emergency Use Authorization (EUA). This EUA will remain  in effect (meaning this test can be used) for the duration of the COVID-19 declaration under Section 564(b)(1) of the Act, 21 U.S.C.section 360bbb-3(b)(1), unless the authorization is terminated  or revoked sooner.       Influenza A by PCR NEGATIVE NEGATIVE Final   Influenza B by PCR NEGATIVE NEGATIVE Final    Comment: (NOTE) The Xpert Xpress SARS-CoV-2/FLU/RSV plus assay is intended as an aid in the diagnosis of influenza from Nasopharyngeal swab specimens and should not be used as a sole basis for treatment. Nasal washings and aspirates are unacceptable for Xpert Xpress SARS-CoV-2/FLU/RSV testing.  Fact Sheet for Patients: BloggerCourse.com  Fact Sheet for Healthcare Providers: SeriousBroker.it  This test is not yet approved or cleared by the [place] FDA and has been authorized for detection and/or diagnosis of SARS-CoV-2 by FDA under an Emergency Use Authorization (EUA). This EUA will remain in effect (meaning this test can be used) for the duration of the COVID-19 declaration under Section 564(b)(1) of the Act, 21 U.S.C. section 360bbb-3(b)(1), unless the authorization is terminated or revoked.     Resp Syncytial Virus by PCR NEGATIVE NEGATIVE Final    Comment: (NOTE) Fact Sheet for Patients: BloggerCourse.com  Fact Sheet for Healthcare Providers: SeriousBroker.it  This  test is not yet approved or cleared by the [place] and has been  authorized for detection and/or diagnosis of SARS-CoV-2 by FDA under an Emergency Use Authorization (EUA). This EUA will remain in effect (meaning this test can be used) for the duration of the COVID-19 declaration under Section 564(b)(1) of the Act, 21 U.S.C. section 360bbb-3(b)(1), unless the authorization is terminated or revoked.  Performed at [facility], [address] [place], [place] [address]     Labs: CBC: Recent Labs  Lab 03/06/24 0813 03/07/24 0500 03/08/24 0751 03/10/24 0624  WBC 15.3* 9.3 9.0 6.2  NEUTROABS 10.9*  --   --   --   HGB 12.6 11.5* 10.9* 10.6*  HCT 38.0 35.0* 32.9* 31.2*  MCV 104.7* 105.1* 104.1* 101.3*  PLT 222 204 199 203   Basic Metabolic Panel: Recent Labs  Lab 03/06/24 0813 03/07/24 0500 03/08/24 0751 03/09/24 0501 03/10/24 0624  NA 138 139 140 137 139  K 3.6 4.5 4.1 3.9 3.8  CL 107 104 104 102 103  CO2 25 29 30 29 29   GLUCOSE 228* 131* 105* 98 100*  BUN 15 21 27* 22 20  CREATININE 0.96 0.99 1.06* 0.92 0.93  CALCIUM 8.4* 8.4* 8.6* 8.4* 8.6*   Liver Function Tests: Recent Labs  Lab 03/06/24 0813  AST 26  ALT 6  ALKPHOS 87  BILITOT 1.1  PROT 6.8  ALBUMIN 3.3*   CBG: No results for input(s): "GLUCAP" in the last 168 hours.  Discharge time spent: greater than 30 minutes.  This record has been created using Conservation officer, historic buildings. Errors have been sought and corrected,but may not always be located. Such creation errors do not reflect on the standard of care.   Signed: Arnetha Courser, MD [facility] 03/11/2024

## 2024-03-11 NOTE — Plan of Care (Signed)

## 2024-03-26 ENCOUNTER — Other Ambulatory Visit (HOSPITAL_COMMUNITY): Payer: Self-pay

## 2024-04-15 ENCOUNTER — Other Ambulatory Visit (HOSPITAL_COMMUNITY): Payer: Self-pay

## 2024-07-07 ENCOUNTER — Other Ambulatory Visit: Payer: Self-pay

## 2024-07-07 MED ORDER — ACETYLCYSTEINE 20 % IN SOLN
4.0000 mL | Freq: Three times a day (TID) | RESPIRATORY_TRACT | 0 refills | Status: AC
  Filled 2024-07-07: qty 480, 40d supply, fill #0

## 2024-07-11 ENCOUNTER — Emergency Department

## 2024-07-11 ENCOUNTER — Emergency Department
Admission: EM | Admit: 2024-07-11 | Discharge: 2024-07-11 | Disposition: A | Discharge status: Home / Self Care | Attending: Emergency Medicine | Admitting: Emergency Medicine

## 2024-07-11 ENCOUNTER — Other Ambulatory Visit: Payer: Self-pay

## 2024-07-11 DIAGNOSIS — J449 Chronic obstructive pulmonary disease, unspecified: Secondary | ICD-10-CM | POA: Insufficient documentation

## 2024-07-11 DIAGNOSIS — I509 Heart failure, unspecified: Secondary | ICD-10-CM | POA: Diagnosis not present

## 2024-07-11 DIAGNOSIS — R4781 Slurred speech: Secondary | ICD-10-CM | POA: Diagnosis not present

## 2024-07-11 DIAGNOSIS — R079 Chest pain, unspecified: Secondary | ICD-10-CM | POA: Insufficient documentation

## 2024-07-11 DIAGNOSIS — H538 Other visual disturbances: Secondary | ICD-10-CM | POA: Diagnosis not present

## 2024-07-11 DIAGNOSIS — I110 Hypertensive heart disease with heart failure: Secondary | ICD-10-CM | POA: Insufficient documentation

## 2024-07-11 DIAGNOSIS — R6889 Other general symptoms and signs: Secondary | ICD-10-CM | POA: Diagnosis present

## 2024-07-11 DIAGNOSIS — B372 Candidiasis of skin and nail: Secondary | ICD-10-CM | POA: Diagnosis not present

## 2024-07-11 DIAGNOSIS — R0602 Shortness of breath: Secondary | ICD-10-CM | POA: Diagnosis present

## 2024-07-11 DIAGNOSIS — R059 Cough, unspecified: Secondary | ICD-10-CM | POA: Diagnosis present

## 2024-07-11 DIAGNOSIS — R058 Other specified cough: Secondary | ICD-10-CM

## 2024-07-11 LAB — COMPREHENSIVE METABOLIC PANEL WITH GFR
ALT: <5 U/L (ref 0–44)
AST: 14 U/L — ABNORMAL LOW (ref 15–41)
Albumin: 3.8 g/dL (ref 3.5–5.0)
Alkaline Phosphatase: 94 U/L (ref 38–126)
Anion gap: 7 (ref 5–15)
BUN: 18 mg/dL (ref 8–23)
CO2: 24 mmol/L (ref 22–32)
Calcium: 8.9 mg/dL (ref 8.9–10.3)
Chloride: 108 mmol/L (ref 98–111)
Creatinine, Ser: 0.88 mg/dL (ref 0.44–1.00)
GFR, Estimated: 59 mL/min — ABNORMAL LOW (ref >60)
Glucose, Bld: 120 mg/dL — ABNORMAL HIGH (ref 70–99)
Potassium: 4 mmol/L (ref 3.5–5.1)
Sodium: 139 mmol/L (ref 135–145)
Total Bilirubin: 0.9 mg/dL (ref 0.0–1.2)
Total Protein: 6.7 g/dL (ref 6.5–8.1)

## 2024-07-11 LAB — DIFFERENTIAL
Abs Immature Granulocytes: 0.01 K/uL (ref 0.00–0.07)
Basophils Absolute: 0 K/uL (ref 0.0–0.1)
Basophils Relative: 0 %
Eosinophils Absolute: 0.5 K/uL (ref 0.0–0.5)
Eosinophils Relative: 7 %
Immature Granulocytes: 0 %
Lymphocytes Relative: 27 %
Lymphs Abs: 1.7 K/uL (ref 0.7–4.0)
Monocytes Absolute: 0.5 K/uL (ref 0.1–1.0)
Monocytes Relative: 8 %
Neutro Abs: 3.5 K/uL (ref 1.7–7.7)
Neutrophils Relative %: 58 %

## 2024-07-11 LAB — CBC
HCT: 36 % (ref 36.0–46.0)
Hemoglobin: 12.1 g/dL (ref 12.0–15.0)
MCH: 33.7 pg (ref 26.0–34.0)
MCHC: 33.6 g/dL (ref 30.0–36.0)
MCV: 100.3 fL — ABNORMAL HIGH (ref 80.0–100.0)
Platelets: 168 K/uL (ref 150–400)
RBC: 3.59 MIL/uL — ABNORMAL LOW (ref 3.87–5.11)
RDW: 14.6 % (ref 11.5–15.5)
WBC: 6.1 K/uL (ref 4.0–10.5)
nRBC: 0 % (ref 0.0–0.2)

## 2024-07-11 LAB — APTT: aPTT: 31 s (ref 24–36)

## 2024-07-11 LAB — TROPONIN I (HIGH SENSITIVITY)
Troponin I (High Sensitivity): 7 ng/L (ref <18)
Troponin I (High Sensitivity): 8 ng/L (ref <18)

## 2024-07-11 LAB — LACTIC ACID, PLASMA
Lactic Acid, Venous: 1.1 mmol/L (ref 0.5–1.9)
Lactic Acid, Venous: 1.1 mmol/L (ref 0.5–1.9)

## 2024-07-11 LAB — PROTIME-INR
INR: 1 (ref 0.8–1.2)
Prothrombin Time: 14.3 s (ref 11.4–15.2)

## 2024-07-11 LAB — ETHANOL: Alcohol, Ethyl (B): <15 mg/dL (ref <15)

## 2024-07-11 MED ORDER — NYSTATIN 100000 UNIT/GM EX POWD: 1.0000 | Freq: Three times a day (TID) | CUTANEOUS | 0 refills | Status: AC

## 2024-07-11 MED ORDER — SODIUM CHLORIDE 0.9% FLUSH
3.0000 mL | Freq: Once | INTRAVENOUS | Status: AC
  Administered 2024-07-11: 3 mL via INTRAVENOUS

## 2024-07-11 MED ORDER — PREDNISONE 10 MG (21) PO TBPK: ORAL_TABLET | ORAL | 0 refills | Status: DC

## 2024-07-11 MED ORDER — AZITHROMYCIN 250 MG PO TABS: ORAL_TABLET | ORAL | 0 refills | Status: AC

## 2024-07-11 NOTE — ED Triage Notes (Signed)
 Pt to ed from home via POV with her neighbor for CP x 2 days with deep  cough and mucus production. Pt also has had some slurred speech and blurred vision over the last week. Pt is caox4, in no acute distress in triage. As placing EKG leads pt has significant infection smell and drainage and redness under her left breast.

## 2024-07-11 NOTE — ED Notes (Signed)
First set of cultures sent down 

## 2024-07-11 NOTE — ED Provider Notes (Signed)
 [facility] Provider Note   Event Date/Time   First MD Initiated Contact with Patient 07/11/24 1530     (approximate) History  Chest Pain (X 2 days)  HPI Sheila Long is a 89 y.o. female with a past medical history of hypertension, sarcoid, anxiety/depression, CHF, and peripheral vascular disease who presents via private vehicle with her neighbor complaining of of pain under her left breast and a lesion to this area over the past 2 days.  Patient also states that she has had cough and congestion over the last week as well.  Patient is a poor historian and does not know when these lesions started ROS: Patient currently denies any vision changes, tinnitus, difficulty speaking, facial droop, sore throat, chest pain, shortness of breath, abdominal pain, nausea/vomiting/diarrhea, dysuria, or weakness/numbness/paresthesias in any extremity   Physical Exam  Triage Vital Signs: ED Triage Vitals [07/11/24 1510]  Encounter Vitals Group     BP 120/80     Girls Systolic BP Percentile      Girls Diastolic BP Percentile      Boys Systolic BP Percentile      Boys Diastolic BP Percentile      Pulse Rate 73     Resp 16     Temp 98.9 F (37.2 C)     Temp Source Oral     SpO2 95 %     Weight      Height 5' 4 (1.626 m)     Head Circumference      Peak Flow      Pain Score 0     Pain Loc      Pain Education      Exclude from Growth Chart    Most recent vital signs: Vitals:   07/11/24 1510 07/11/24 1630  BP: 120/80 (!) 159/67  Pulse: 73 65  Resp: 16 (!) 32  Temp: 98.9 F (37.2 C)   SpO2: 95% 100%   General: Awake, oriented x4. CV:  Good peripheral perfusion. Resp:  Normal effort. Abd:  No distention. Other:  Elderly obese African-American female erythema and tenderness to palpation under the left breast as well as the left inguinal crease.  Resting comfortably in no acute distress ED Results / Procedures / Treatments  Labs (all labs ordered are  listed, but only abnormal results are displayed) Labs Reviewed  CBC - Abnormal; Notable for the following components:      Result Value   RBC 3.59 (*)    MCV 100.3 (*)    All other components within normal limits  COMPREHENSIVE METABOLIC PANEL WITH GFR - Abnormal; Notable for the following components:   Glucose, Bld 120 (*)    AST 14 (*)    GFR, Estimated 59 (*)    All other components within normal limits  PROTIME-INR  APTT  DIFFERENTIAL  ETHANOL  LACTIC ACID, PLASMA  LACTIC ACID, PLASMA  TROPONIN I (HIGH SENSITIVITY)  TROPONIN I (HIGH SENSITIVITY)   EKG ED ECG REPORT I, Artist MARLA Kerns, the attending physician, personally viewed and interpreted this ECG. Date: 07/11/2024 EKG Time: 1507 Rate: 67 Rhythm: normal sinus rhythm QRS Axis: normal Intervals: normal ST/T Wave abnormalities: normal Narrative Interpretation: no evidence of acute ischemia RADIOLOGY ED MD interpretation: 2 view chest x-ray interpreted by me shows no evidence of acute abnormalities including no pneumonia, pneumothorax, or widened mediastinum - All radiology independently interpreted and agree with radiology assessment Official radiology report(s): CT HEAD WO CONTRAST Result Date: 07/11/2024 EXAM: CT HEAD  WITHOUT 07/11/2024 05:09:00 PM TECHNIQUE: CT of the head was performed without the administration of intravenous contrast. Automated exposure control, iterative reconstruction, and/or weight based adjustment of the mA/kV was utilized to reduce the radiation dose to as low as reasonably achievable. COMPARISON: 01/06/2023 CLINICAL HISTORY: Neuro deficit, acute, stroke suspected. Patient presented with chest pain, deep cough, mucus production, slurred speech, and blurred vision. FINDINGS: BRAIN AND VENTRICLES: No acute intracranial hemorrhage. No mass effect or midline shift. No extra-axial fluid collection. Gray-white differentiation is maintained. No hydrocephalus. Stable background of mild chronic small vessel  disease and generalized volume loss. ORBITS: No acute abnormality. SINUSES AND MASTOIDS: No acute abnormality. SOFT TISSUES AND SKULL: No acute skull fracture. No acute soft tissue abnormality. IMPRESSION: 1. No acute intracranial abnormality. 2. Stable background of mild chronic small vessel disease and generalized volume loss. Electronically signed by: Ryan Chess MD 07/11/2024 05:19 PM EDT RP Workstation: [id number]   DG Chest 2 View Result Date: 07/11/2024 CLINICAL DATA:  Cough, chest pain EXAM: CHEST - 2 VIEW COMPARISON:  March 07, 2024 FINDINGS: No focal consolidation.  No pleural effusions.  No pneumothorax. Unchanged mildly enlarged cardiomediastinal silhouette. No acute osseous findings. IMPRESSION: No acute airspace disease.  Unchanged cardiomegaly. Electronically Signed   By: Michaeline Blanch M.D.   On: 07/11/2024 15:41   PROCEDURES: Critical Care performed: No Procedures MEDICATIONS ORDERED IN ED: Medications  sodium chloride  flush (NS) 0.9 % injection 3 mL (3 mLs Intravenous Given 07/11/24 1520)   IMPRESSION / MDM / ASSESSMENT AND PLAN / ED COURSE  I reviewed the triage vital signs and the nursing notes.                             The patient is on the cardiac monitor to evaluate for evidence of arrhythmia and/or significant heart rate changes. Patient's presentation is most consistent with acute presentation with potential threat to life or bodily function. The patient is suffering from shortness of breath, but the immediate cause is not apparent.  Patient does have history of COPD upon chart review and with productive cough this may be an element of COPD exacerbation  Potential causes considered include, but are not limited to, asthma or COPD, congestive heart failure, pulmonary embolism, pneumothorax, coronary syndrome, pneumonia, and pleural effusion.  Despite the evaluation including history, exam, and testing, the cause of the shortness of breath remains unclear. However,  during the ED stay, patient's condition improved, and at the time of discharge the shortness of breath is resolved, they are feeling well, and want to go home. Rx: Nystatin , prednisone , azithromycin  Patient will be discharged with strict return precautions and advice to follow up with primary MD within 24 hours for further evaluation.   FINAL CLINICAL IMPRESSION(S) / ED DIAGNOSES   Final diagnoses:  Chest pain, unspecified type  Productive cough  [name]  Yeast infection of the skin   Rx / DC Orders   ED Discharge Orders          Ordered    predniSONE  (STERAPRED UNI-PAK 21 TAB) 10 MG (21) TBPK tablet        07/11/24 1728    azithromycin  (ZITHROMAX  Z-PAK) 250 MG tablet        07/11/24 1728    nystatin  (MYCOSTATIN /NYSTOP ) powder  3 times daily        07/11/24 1728    Ambulatory referral to Cardiology       Comments: If you  have not heard from the Cardiology office within the next 72 hours please call [phone].   07/11/24 1728           Note:  This document was prepared using Dragon voice recognition software and may include unintentional dictation errors.   Teyana Pierron K, MD 07/11/24 [phone]

## 2024-07-17 ENCOUNTER — Other Ambulatory Visit: Payer: Self-pay

## 2024-07-17 ENCOUNTER — Inpatient Hospital Stay
Admission: EM | Admit: 2024-07-17 | Discharge: 2024-07-20 | DRG: 291 | Disposition: A | Discharge status: Home Health | Attending: Internal Medicine | Admitting: Internal Medicine

## 2024-07-17 ENCOUNTER — Emergency Department

## 2024-07-17 ENCOUNTER — Inpatient Hospital Stay

## 2024-07-17 DIAGNOSIS — I5033 Acute on chronic diastolic (congestive) heart failure: Secondary | ICD-10-CM | POA: Diagnosis present

## 2024-07-17 DIAGNOSIS — Z66 Do not resuscitate: Secondary | ICD-10-CM | POA: Diagnosis present

## 2024-07-17 DIAGNOSIS — G20A1 Parkinson's disease without dyskinesia, without mention of fluctuations: Secondary | ICD-10-CM | POA: Diagnosis present

## 2024-07-17 DIAGNOSIS — T502X5A Adverse effect of carbonic-anhydrase inhibitors, benzothiadiazides and other diuretics, initial encounter: Secondary | ICD-10-CM | POA: Diagnosis present

## 2024-07-17 DIAGNOSIS — Z515 Encounter for palliative care: Secondary | ICD-10-CM | POA: Diagnosis not present

## 2024-07-17 DIAGNOSIS — Z96643 Presence of artificial hip joint, bilateral: Secondary | ICD-10-CM | POA: Diagnosis present

## 2024-07-17 DIAGNOSIS — I160 Hypertensive urgency: Secondary | ICD-10-CM | POA: Diagnosis present

## 2024-07-17 DIAGNOSIS — Z885 Allergy status to narcotic agent status: Secondary | ICD-10-CM

## 2024-07-17 DIAGNOSIS — G4733 Obstructive sleep apnea (adult) (pediatric): Secondary | ICD-10-CM | POA: Diagnosis present

## 2024-07-17 DIAGNOSIS — Z7951 Long term (current) use of inhaled steroids: Secondary | ICD-10-CM

## 2024-07-17 DIAGNOSIS — Z6832 Body mass index (BMI) 32.0-32.9, adult: Secondary | ICD-10-CM

## 2024-07-17 DIAGNOSIS — G629 Polyneuropathy, unspecified: Secondary | ICD-10-CM | POA: Diagnosis present

## 2024-07-17 DIAGNOSIS — K219 Gastro-esophageal reflux disease without esophagitis: Secondary | ICD-10-CM | POA: Diagnosis present

## 2024-07-17 DIAGNOSIS — D7589 Other specified diseases of blood and blood-forming organs: Secondary | ICD-10-CM | POA: Diagnosis present

## 2024-07-17 DIAGNOSIS — Z9861 Coronary angioplasty status: Secondary | ICD-10-CM

## 2024-07-17 DIAGNOSIS — D869 Sarcoidosis, unspecified: Secondary | ICD-10-CM | POA: Diagnosis present

## 2024-07-17 DIAGNOSIS — E66811 Obesity, class 1: Secondary | ICD-10-CM | POA: Diagnosis present

## 2024-07-17 DIAGNOSIS — I110 Hypertensive heart disease with heart failure: Principal | ICD-10-CM | POA: Diagnosis present

## 2024-07-17 DIAGNOSIS — Z888 Allergy status to other drugs, medicaments and biological substances status: Secondary | ICD-10-CM

## 2024-07-17 DIAGNOSIS — I2510 Atherosclerotic heart disease of native coronary artery without angina pectoris: Secondary | ICD-10-CM | POA: Diagnosis present

## 2024-07-17 DIAGNOSIS — I700 Atherosclerosis of aorta: Secondary | ICD-10-CM | POA: Diagnosis present

## 2024-07-17 DIAGNOSIS — R0600 Dyspnea, unspecified: Secondary | ICD-10-CM | POA: Diagnosis present

## 2024-07-17 DIAGNOSIS — R0602 Shortness of breath: Secondary | ICD-10-CM | POA: Diagnosis not present

## 2024-07-17 DIAGNOSIS — Z96651 Presence of right artificial knee joint: Secondary | ICD-10-CM | POA: Diagnosis present

## 2024-07-17 DIAGNOSIS — Z882 Allergy status to sulfonamides status: Secondary | ICD-10-CM

## 2024-07-17 DIAGNOSIS — Z79899 Other long term (current) drug therapy: Secondary | ICD-10-CM | POA: Diagnosis not present

## 2024-07-17 DIAGNOSIS — I4510 Unspecified right bundle-branch block: Secondary | ICD-10-CM | POA: Diagnosis present

## 2024-07-17 DIAGNOSIS — E538 Deficiency of other specified B group vitamins: Secondary | ICD-10-CM | POA: Diagnosis present

## 2024-07-17 DIAGNOSIS — E876 Hypokalemia: Secondary | ICD-10-CM | POA: Diagnosis present

## 2024-07-17 DIAGNOSIS — J9811 Atelectasis: Secondary | ICD-10-CM | POA: Diagnosis present

## 2024-07-17 DIAGNOSIS — J45901 Unspecified asthma with (acute) exacerbation: Secondary | ICD-10-CM | POA: Diagnosis present

## 2024-07-17 DIAGNOSIS — Z8249 Family history of ischemic heart disease and other diseases of the circulatory system: Secondary | ICD-10-CM

## 2024-07-17 DIAGNOSIS — Z7902 Long term (current) use of antithrombotics/antiplatelets: Secondary | ICD-10-CM

## 2024-07-17 DIAGNOSIS — I739 Peripheral vascular disease, unspecified: Secondary | ICD-10-CM | POA: Diagnosis present

## 2024-07-17 DIAGNOSIS — J069 Acute upper respiratory infection, unspecified: Secondary | ICD-10-CM | POA: Diagnosis present

## 2024-07-17 DIAGNOSIS — Z800 Family history of malignant neoplasm of digestive organs: Secondary | ICD-10-CM

## 2024-07-17 DIAGNOSIS — R609 Edema, unspecified: Secondary | ICD-10-CM | POA: Insufficient documentation

## 2024-07-17 DIAGNOSIS — I10 Essential (primary) hypertension: Secondary | ICD-10-CM | POA: Diagnosis present

## 2024-07-17 DIAGNOSIS — T17800A Unspecified foreign body in other parts of respiratory tract causing asphyxiation, initial encounter: Secondary | ICD-10-CM

## 2024-07-17 DIAGNOSIS — I77810 Thoracic aortic ectasia: Secondary | ICD-10-CM | POA: Diagnosis present

## 2024-07-17 LAB — BLOOD GAS, VENOUS

## 2024-07-17 LAB — CBC
HCT: 38.5 % (ref 36.0–46.0)
Hemoglobin: 12.9 g/dL (ref 12.0–15.0)
MCH: 33.6 pg (ref 26.0–34.0)
MCHC: 33.5 g/dL (ref 30.0–36.0)
MCV: 100.3 fL — ABNORMAL HIGH (ref 80.0–100.0)
Platelets: 189 K/uL (ref 150–400)
RBC: 3.84 MIL/uL — ABNORMAL LOW (ref 3.87–5.11)
RDW: 14.6 % (ref 11.5–15.5)
WBC: 8.3 K/uL (ref 4.0–10.5)
nRBC: 0 % (ref 0.0–0.2)

## 2024-07-17 LAB — BASIC METABOLIC PANEL WITH GFR
Anion gap: 8 (ref 5–15)
BUN: 20 mg/dL (ref 8–23)
CO2: 27 mmol/L (ref 22–32)
Calcium: 8.7 mg/dL — ABNORMAL LOW (ref 8.9–10.3)
Chloride: 106 mmol/L (ref 98–111)
Creatinine, Ser: 1 mg/dL (ref 0.44–1.00)
GFR, Estimated: 51 mL/min — ABNORMAL LOW (ref >60)
Glucose, Bld: 158 mg/dL — ABNORMAL HIGH (ref 70–99)
Potassium: 4.3 mmol/L (ref 3.5–5.1)
Sodium: 141 mmol/L (ref 135–145)

## 2024-07-17 LAB — TROPONIN I (HIGH SENSITIVITY)
Troponin I (High Sensitivity): 11 ng/L (ref <18)
Troponin I (High Sensitivity): 10 ng/L (ref <18)

## 2024-07-17 LAB — BRAIN NATRIURETIC PEPTIDE: B Natriuretic Peptide: 384.4 pg/mL — ABNORMAL HIGH (ref 0.0–100.0)

## 2024-07-17 MED ORDER — ENOXAPARIN SODIUM 40 MG/0.4ML IJ SOSY
40.0000 mg | PREFILLED_SYRINGE | INTRAMUSCULAR | Status: DC
  Administered 2024-07-17 – 2024-07-18 (×2): 40 mg via SUBCUTANEOUS
  Filled 2024-07-17 (×2): qty 0.4

## 2024-07-17 MED ORDER — ONDANSETRON HCL 4 MG PO TABS: 4.0000 mg | ORAL_TABLET | Freq: Four times a day (QID) | ORAL | Status: DC | PRN

## 2024-07-17 MED ORDER — METOPROLOL SUCCINATE ER 100 MG PO TB24
100.0000 mg | ORAL_TABLET | Freq: Every day | ORAL | Status: DC
  Administered 2024-07-18 – 2024-07-20 (×3): 100 mg via ORAL
  Filled 2024-07-17 (×3): qty 1

## 2024-07-17 MED ORDER — CARBIDOPA-LEVODOPA 25-100 MG PO TABS
1.0000 | ORAL_TABLET | Freq: Every day | ORAL | Status: DC
  Filled 2024-07-17: qty 1

## 2024-07-17 MED ORDER — FUROSEMIDE 10 MG/ML IJ SOLN
40.0000 mg | Freq: Once | INTRAMUSCULAR | Status: AC
  Administered 2024-07-17: 40 mg via INTRAVENOUS
  Filled 2024-07-17: qty 4

## 2024-07-17 MED ORDER — IOHEXOL 350 MG/ML SOLN
75.0000 mL | Freq: Once | INTRAVENOUS | Status: AC | PRN
  Administered 2024-07-17: 75 mL via INTRAVENOUS

## 2024-07-17 MED ORDER — FUROSEMIDE 10 MG/ML IJ SOLN
40.0000 mg | Freq: Two times a day (BID) | INTRAMUSCULAR | Status: DC
  Administered 2024-07-18 – 2024-07-20 (×6): 40 mg via INTRAVENOUS
  Filled 2024-07-17 (×6): qty 4

## 2024-07-17 MED ORDER — UMECLIDINIUM-VILANTEROL 62.5-25 MCG/ACT IN AEPB
1.0000 | INHALATION_SPRAY | Freq: Every day | RESPIRATORY_TRACT | Status: DC
  Administered 2024-07-18 – 2024-07-20 (×3): 1 via RESPIRATORY_TRACT
  Filled 2024-07-17: qty 14

## 2024-07-17 MED ORDER — FOLIC ACID 1 MG PO TABS
1.0000 mg | ORAL_TABLET | Freq: Every day | ORAL | Status: DC
  Administered 2024-07-17 – 2024-07-20 (×4): 1 mg via ORAL
  Filled 2024-07-17 (×4): qty 1

## 2024-07-17 MED ORDER — SENNOSIDES-DOCUSATE SODIUM 8.6-50 MG PO TABS: 1.0000 | ORAL_TABLET | Freq: Every evening | ORAL | Status: DC | PRN

## 2024-07-17 MED ORDER — CLOPIDOGREL BISULFATE 75 MG PO TABS
75.0000 mg | ORAL_TABLET | Freq: Every day | ORAL | Status: DC
  Administered 2024-07-17 – 2024-07-20 (×4): 75 mg via ORAL
  Filled 2024-07-17 (×4): qty 1

## 2024-07-17 MED ORDER — IPRATROPIUM-ALBUTEROL 0.5-2.5 (3) MG/3ML IN SOLN
6.0000 mL | Freq: Once | RESPIRATORY_TRACT | Status: AC
  Administered 2024-07-17: 6 mL via RESPIRATORY_TRACT
  Filled 2024-07-17: qty 6

## 2024-07-17 MED ORDER — LATANOPROST 0.005 % OP SOLN
1.0000 [drp] | Freq: Every day | OPHTHALMIC | Status: DC
  Administered 2024-07-17 – 2024-07-19 (×3): 1 [drp] via OPHTHALMIC
  Filled 2024-07-17: qty 2.5

## 2024-07-17 MED ORDER — CARBIDOPA-LEVODOPA 25-100 MG PO TABS
1.0000 | ORAL_TABLET | Freq: Two times a day (BID) | ORAL | Status: DC
  Administered 2024-07-17 – 2024-07-20 (×6): 1 via ORAL
  Filled 2024-07-17 (×7): qty 1

## 2024-07-17 MED ORDER — ONDANSETRON HCL 4 MG/2ML IJ SOLN: 4.0000 mg | Freq: Four times a day (QID) | INTRAMUSCULAR | Status: DC | PRN

## 2024-07-17 MED ORDER — LISINOPRIL 20 MG PO TABS
20.0000 mg | ORAL_TABLET | Freq: Every day | ORAL | Status: DC
  Administered 2024-07-18 – 2024-07-20 (×3): 20 mg via ORAL
  Filled 2024-07-17 (×3): qty 1

## 2024-07-17 MED ORDER — ACETAMINOPHEN 325 MG PO TABS: 650.0000 mg | ORAL_TABLET | Freq: Four times a day (QID) | ORAL | Status: DC | PRN

## 2024-07-17 MED ORDER — ALBUTEROL SULFATE (2.5 MG/3ML) 0.083% IN NEBU: 2.5000 mg | INHALATION_SOLUTION | RESPIRATORY_TRACT | Status: DC | PRN

## 2024-07-17 MED ORDER — PREDNISONE 20 MG PO TABS
40.0000 mg | ORAL_TABLET | Freq: Every day | ORAL | Status: DC
  Administered 2024-07-18 – 2024-07-20 (×3): 40 mg via ORAL
  Filled 2024-07-17 (×3): qty 2

## 2024-07-17 MED ORDER — ACETAMINOPHEN 650 MG RE SUPP
650.0000 mg | Freq: Four times a day (QID) | RECTAL | Status: DC | PRN
Start: 2024-07-17 — End: 2024-07-20

## 2024-07-17 MED ORDER — ESCITALOPRAM OXALATE 10 MG PO TABS
5.0000 mg | ORAL_TABLET | Freq: Every day | ORAL | Status: DC
  Administered 2024-07-18 – 2024-07-20 (×3): 5 mg via ORAL
  Filled 2024-07-17 (×3): qty 1

## 2024-07-17 MED ORDER — IPRATROPIUM-ALBUTEROL 0.5-2.5 (3) MG/3ML IN SOLN
3.0000 mL | Freq: Four times a day (QID) | RESPIRATORY_TRACT | Status: DC
  Administered 2024-07-17 – 2024-07-18 (×3): 3 mL via RESPIRATORY_TRACT
  Filled 2024-07-17 (×3): qty 3

## 2024-07-17 NOTE — Assessment & Plan Note (Signed)
 Sarcoidosis No new lymphadenopathy on her CT pulmonary angiogram do not believe this is an acute sarcoid exacerbation

## 2024-07-17 NOTE — Assessment & Plan Note (Signed)
 Hypocalcemia Normal when corrected for the patient's low albumin

## 2024-07-17 NOTE — ED Provider Notes (Signed)
 [facility] Provider Note    Event Date/Time   First MD Initiated Contact with Patient 07/17/24 1548     (approximate)   History   [name]   HPI  Sheila Long is a 89 y.o. female who presents to the ED for evaluation of [name]   Review of medical DC summary from March.  History of Parkinson's disease, diastolic CHF, CAD, sarcoidosis, ambulatory dysfunction admitted for hypoxic failure associated with CHF and influenza A  Patient presents to the ED for evaluation of shortness of breath, congestion and feeling sensation that she has mucus in her throat that she cannot move.  Denies fevers or acutely worsening cough, no pain or pressure to her chest.  No syncope, dizziness or falls.  No abdominal pain or emesis.   Physical Exam   Triage Vital Signs: ED Triage Vitals  Encounter Vitals Group     BP 07/17/24 1542 (!) 172/71     Girls Systolic BP Percentile --      Girls Diastolic BP Percentile --      Boys Systolic BP Percentile --      Boys Diastolic BP Percentile --      Pulse Rate 07/17/24 1545 83     Resp 07/17/24 1542 (!) 22     Temp 07/17/24 1542 98.4 F (36.9 C)     Temp Source 07/17/24 1542 Oral     SpO2 07/17/24 1545 95 %     Weight 07/17/24 1543 187 lb 6.3 oz (85 kg)     Height 07/17/24 1543 5' 4 (1.626 m)     Head Circumference --      Peak Flow --      Pain Score 07/17/24 1543 0     Pain Loc --      Pain Education --      Exclude from Growth Chart --     Most recent vital signs: Vitals:   07/17/24 1542 07/17/24 1545  BP: (!) 172/71   Pulse:  83  Resp: (!) 22   Temp: 98.4 F (36.9 C)   SpO2:  95%    General: Awake, no distress.  CV:  Good peripheral perfusion.  Resp:  Tachypneic to the mid/upper 20s.  No distress. Abd:  No distention.  MSK:  No deformity noted.  Neuro:  No focal deficits appreciated. Other:     ED Results / Procedures / Treatments   Labs (all labs ordered are  listed, but only abnormal results are displayed) Labs Reviewed  BASIC METABOLIC PANEL WITH GFR - Abnormal; Notable for the following components:      Result Value   Glucose, Bld 158 (*)    Calcium 8.7 (*)    GFR, Estimated 51 (*)    All other components within normal limits  CBC - Abnormal; Notable for the following components:   RBC 3.84 (*)    MCV 100.3 (*)    All other components within normal limits  BRAIN NATRIURETIC PEPTIDE - Abnormal; Notable for the following components:   B Natriuretic Peptide 384.4 (*)    All other components within normal limits  TROPONIN I (HIGH SENSITIVITY)  TROPONIN I (HIGH SENSITIVITY)    EKG Sinus rhythm with a rate of 73 bpm.  Normal axis, right bundle, no STEMI  RADIOLOGY CXR interpreted by me without evidence of acute cardiopulmonary pathology.  Official radiology report(s): CT Angio Chest PE W and/or Wo Contrast Result Date: 07/17/2024 CLINICAL DATA:  History of  sarcoidosis. Short of breath with chest congestion and pain. EXAM: CT ANGIOGRAPHY CHEST WITH CONTRAST TECHNIQUE: Multidetector CT imaging of the chest was performed using the standard protocol during bolus administration of intravenous contrast. Multiplanar CT image reconstructions and MIPs were obtained to evaluate the vascular anatomy. RADIATION DOSE REDUCTION: This exam was performed according to the departmental dose-optimization program which includes automated exposure control, adjustment of the mA and/or kV according to patient size and/or use of iterative reconstruction technique. CONTRAST:  75mL OMNIPAQUE  IOHEXOL  350 MG/ML SOLN COMPARISON:  01/06/2023. FINDINGS: Cardiovascular: Pulmonary arteries to the proximal segmental level are well opacified. There is no evidence of a pulmonary embolism. Heart is normal in size. No pericardial effusion. Aorta is minimally opacified. Ascending aorta measures 4 cm in diameter. No convincing dissection. Stable atherosclerosis. Mediastinum/Nodes: No  neck base, mediastinal or hilar masses. No enlarged lymph nodes. Trachea and esophagus are unremarkable. Moderate hiatal hernia. Lungs/Pleura: Secretions/mucus plugging noted in lower lobe bronchi, left greater than right. Trachea and bronchi are partly flattened consistent with the exam being expiratory in phase. There are linear and patchy opacities in the left lower lobe consistent with atelectasis, associated with volume loss. Remainder of the lungs is essentially clear. Trace left pleural effusion. No right pleural effusion. No pneumothorax. Upper Abdomen: No acute findings.  Aortic atherosclerosis. Musculoskeletal: No fracture or acute finding. No bone lesion. Skeletal structures are demineralized. No chest wall mass. Review of the MIP images confirms the above findings. IMPRESSION: 1. No evidence of a pulmonary embolism. 2. Excretion/mucous plugging partly occluding lower lobe bronchi, greater on the left, on the left associated with atelectasis. No convincing pneumonia. No pulmonary edema. 3. Dilated ascending thoracic aorta to 4 cm. Recommend annual imaging followup by CTA or MRA. This recommendation follows 2010 ACCF/AHA/AATS/ACR/ASA/SCA/SCAI/SIR/STS/SVM Guidelines for the Diagnosis and Management of Patients with Thoracic Aortic Disease. Circulation. 2010; 121: Z733-z630. Aortic aneurysm NOS (ICD10-I71.9) 4. Aortic atherosclerosis. Aortic Atherosclerosis (ICD10-I70.0). Electronically Signed   By: Alm Parkins M.D.   On: 07/17/2024 18:29   DG Chest Portable 1 View Result Date: 07/17/2024 CLINICAL DATA:  [name].  Cough. EXAM: PORTABLE CHEST 1 VIEW COMPARISON:  07/11/2024.  CT 01/06/2023 FINDINGS: Cardiomegaly is stable. Retrocardiac hiatal hernia. Dense aortic atherosclerosis. Minor atelectasis at the left lung base. No confluent airspace disease. No pulmonary edema, pleural effusion or pneumothorax. IMPRESSION: 1. Stable cardiomegaly. No acute findings. 2. Hiatal hernia. Electronically  Signed   By: Andrea Gasman M.D.   On: 07/17/2024 16:33    PROCEDURES and INTERVENTIONS:  .1-3 Lead EKG Interpretation  Performed by: Claudene Rover, MD Authorized by: Claudene Rover, MD     Interpretation: normal     ECG rate:  80   ECG rate assessment: normal     Rhythm: sinus rhythm     Ectopy: none     Conduction: normal     Medications  furosemide  (LASIX ) injection 40 mg (has no administration in time range)  ipratropium-albuterol  (DUONEB) 0.5-2.5 (3) MG/3ML nebulizer solution 6 mL (6 mLs Nebulization Given 07/17/24 1743)  iohexol  (OMNIPAQUE ) 350 MG/ML injection 75 mL (75 mLs Intravenous Contrast Given 07/17/24 1720)     IMPRESSION / MDM / ASSESSMENT AND PLAN / ED COURSE  I reviewed the triage vital signs and the nursing notes.  Differential diagnosis includes, but is not limited to, ACS, PTX, PNA, muscle strain/spasm, PE, dissection, anxiety, pleural effusion, CHF exacerbation  {Patient presents with symptoms of an acute illness or injury that is potentially life-threatening.  Patient with  Parkinson's, diastolic CHF and sarcoid presents with shortness of breath with evidence of CHF exacerbation and mucous plugging contributing to her dyspnea.  She is tachypneic and symptomatic but not hypoxic.  2 negative troponins, normal CBC and metabolic panel.  BNP is elevated.  CTA chest with mucous plugging but no infiltrate or PE.  Minimal improvement with breathing treatments.  Will start IV diuresis.  Suspect she would benefit from admission to facilitate respiratory therapy and mucolytic therapy.  Will consult with hospitalist      FINAL CLINICAL IMPRESSION(S) / ED DIAGNOSES   Final diagnoses:  SOB (shortness of breath)     Rx / DC Orders   ED Discharge Orders     None        Note:  This document was prepared using Dragon voice recognition software and may include unintentional dictation errors.   Claudene Rover, MD 07/17/24 [phone]

## 2024-07-17 NOTE — Assessment & Plan Note (Signed)
 Hypertension Hypertensive urgency without any chest pain or headache systolic blood pressure in the 170s likely secondary to her acute hospitalization acute distress Continue lisinopril  20 mg daily and metoprolol  100 mg extended release The patient's dyspnea does not appear beta-blocker related

## 2024-07-17 NOTE — Assessment & Plan Note (Signed)
 Upper respiratory tract symptoms Respiratory viral panel ordered

## 2024-07-17 NOTE — Assessment & Plan Note (Addendum)
 Mucous plugging Ordered chest PT Pulmonology consultation keep in view bronchoscopy if this conservative measure does not resolve the issue Holding off inhaled mucolytics given absence of evidence in acute patients

## 2024-07-17 NOTE — Assessment & Plan Note (Signed)
 Macrocytosis Patient's MCV is 100.3 Ordered B12 and folic acid 

## 2024-07-17 NOTE — Assessment & Plan Note (Signed)
  Acute diastolic heart failure Patient presents with elevated brain atretic peptide of 384 new onset dependent edema and orthopnea Is on outpatient p.o. Lasix  as per last outpatient note Grade 2 diastolic dysfunction as per the patient's last echo in March Will continue diuresis with 40 mg IV twice daily and follow weights and clinical examination

## 2024-07-17 NOTE — Assessment & Plan Note (Signed)
 Asymmetric dependent edema Although the patient has an acute diastolic heart failure exacerbation she has not acentric metric edema which is atypical for her ordering duplex Doppler to exclude a coexisting DVT although PE negative on her CT pulmonary angiogram

## 2024-07-17 NOTE — Assessment & Plan Note (Signed)
 Obstructive sleep apnea Continue nocturnal CPAP

## 2024-07-17 NOTE — Assessment & Plan Note (Signed)
 Aortic atherosclerosis and dilatation Incidental finding asymptomatic for outpatient follow-up

## 2024-07-17 NOTE — Assessment & Plan Note (Signed)
 Dyspnea Saturating 95% on room air without any evidence of acute respiratory failure Likely multifactorial with mucous plugging, asthma exacerbation (wheezing, noted in her last outpatient visit and she is on Trelegy as outpatient), acute diastolic heart failure exacerbation, CT pulmonary angiogram was negative for any evidence of pneumonia or widespread pulmonary edema but did reveal lower lobe mucous plugging and atelectasis possibly (possibly predisposed by her PD) Plan: For acute asthma exacerbation we will start p.o. prednisone  as well as continue LABA LAMA with scheduled and then as needed albuterol , chest physiotherapy for her mucous plugging with pulmonology consultation if these conservative measures do not result in resolution of her mucous plug, acute diastolic heart failure management as per below, continuous pulse oximetry, VBG ordered to exclude any uncompensated CO2 retention although no encephalopathy currently

## 2024-07-17 NOTE — ED Notes (Signed)
 CCMD called and verified patient on cardiac tele

## 2024-07-17 NOTE — Assessment & Plan Note (Signed)
 Parkinson's disease Has ambulatory dysfunction at baseline due to this continue her carbidopa  levodopa  25/100 as twice daily which was confirmed with her last outpatient primary care visit note in care anywhere

## 2024-07-17 NOTE — ED Triage Notes (Signed)
 Pt states she is here fr SOB and congestion pt states seen for same on 7/13 and states nothing really has changed since then. Pt states productive cough with white sputum.

## 2024-07-17 NOTE — Assessment & Plan Note (Signed)
 Coronary artery disease Chest pain-free continue Plavix  75 mg as outpatient troponins were negative ECG without any acute ischemic changes

## 2024-07-17 NOTE — H&P (Addendum)
 History and Physical    [name] FMW:[id number] DOB: December 26, 1924 DOA: 07/17/2024 DOS: the patient was seen and examined on 07/17/2024 PCP: [name], MD  Patient coming from: Home  Chief Complaint:  Chief Complaint  Patient presents with   [name]   HPI:  89 year old female with a past medical history of Parkinson's disease chronic diastolic heart failure, coronary artery disease, gastroesophageal reflux disease, sarcoidosis and ambulatory dysfunction who presented with 2 weeks of dyspnea orthopnea and increasing dependent edema the patient denies any chills but does report upper respiratory symptoms.  The patient received bronchodilators and 1 dose of Lasix  in the emergency department.  Independent review interpretation of the patient's twelve-lead ECG today she has a sinus rhythm with ventricular rate in the low 70s with right bundle branch block QTc is 458  Past medical record review of the patient's last echocardiogram was March 2025 grade 2 diastolic dysfunction  Case was discussed with emergency department provider and the patient was subsequently admitted into the hospital  Past medical records reviewed and summarized the patient has a primary care note at [facility] from 07/06/2024 in care anywhere: At that time the patient was on carbidopa  levodopa  25 100 twice daily, clopidogrel  75 mg daily was on Trelegy Lasix  20 mg daily by mouth and latanoprost  eyedrops which is slightly different than our pharmacy reconciliation here have updated the patient's medications appropriately it was a Medicare wellness visit noted to have asthma,was ordered a CPAP machine at that time as well.  Review of Systems:  Constitutional: Denies Weight Loss, Fever, Chills or Night Sweats Respiratory: Reports [name], Cough,  Wheezing, but no hemoptysis Cardiovascular: Denies Chest Pain or Palpitations But reports Paroxsymal Nocturnal Dyspnea,   Edema Gastrointestinal: Denies Nausea, Vomiting, Diarrhea, Hematemesis, Melena Genitourinary: Denies hematuria, dysuria, hesitancy, incontinence All other systems were reviewed and are negative  Past Medical History:  Diagnosis Date   Allergic rhinitis due to pollen    Anxiety    Aortic atherosclerosis (HCC)    CHF (congestive heart failure) (HCC)    Coronary artery disease    Stent in 2000's (DC)   Dyspnea    GERD (gastroesophageal reflux disease)    Hard of hearing    Hypertension    Neuropathy    Obstructive sleep apnea    Osteoarthritis of both knees    Peripheral vascular disease (HCC)    Sarcoidosis    Thyroid  nodule    Past Surgical History:  Procedure Laterality Date   ABDOMINAL HYSTERECTOMY     BIOPSY THYROID   2014   Dr Milissa   BREAST EXCISIONAL BIOPSY Left 1980s   surgical bx neg   BREAST EXCISIONAL BIOPSY Left 1980s   benign   CARDIOVASCULAR STRESS TEST  05/12/2012   with dobutamine---negative. Normal LV function   CORONARY ANGIOPLASTY  2006   ESOPHAGOGASTRODUODENOSCOPY (EGD) WITH PROPOFOL  N/A 04/03/2020   Procedure: ESOPHAGOGASTRODUODENOSCOPY (EGD) WITH PROPOFOL ;  Surgeon: Therisa Bi, MD;  Location: [facility] ENDOSCOPY;  Service: Gastroenterology;  Laterality: N/A;   JOINT REPLACEMENT Left 2000   hip   JOINT REPLACEMENT Right 2007   hip   KNEE ARTHROPLASTY Right 06/23/2017   Procedure: COMPUTER ASSISTED TOTAL KNEE ARTHROPLASTY;  Surgeon: Mardee Lynwood SQUIBB, MD;  Location: [facility] ORS;  Service: Orthopedics;  Laterality: Right;   Social History:  reports that she has never smoked. She has never used smokeless tobacco. She reports that she does not drink alcohol and does not use drugs.  Allergies  Allergen  Reactions   Tramadol  Other (See Comments)    CONFUSION   Sulfa Antibiotics     Patient does not recall   Zocor [Simvastatin] Other (See Comments)    INTOLERANCE-MYALGIAS     Family History  Problem Relation Age of Onset   Cancer Sister 76       unknown -  groin area   Heart attack Brother    Cancer Sister 69       pancreatic    Heart attack Sister    Heart attack Brother     Prior to Admission medications   Medication Sig Start Date End Date Taking? Authorizing Provider  acetylcysteine  (MUCOMYST ) 20 % nebulizer solution Inhale 4 mLs into the lungs 3 (three) times daily Use in your nebulizer machine 07/07/24     carbidopa -levodopa  (SINEMET  IR) 25-100 MG tablet Take 1 tablet by mouth at bedtime for 7 days. 02/18/24 04/22/24  Trudy Anthony HERO, MD  docusate sodium  (COLACE) 100 MG capsule Take 1 capsule (100 mg total) by mouth 2 (two) times daily. 03/11/24   Amin, Sumayya, MD  escitalopram  (LEXAPRO ) 5 MG tablet Take 1 tablet (5 mg total) by mouth daily for 7 days. 02/18/24 04/22/25  Trudy Anthony HERO, MD  ferrous sulfate  325 (65 FE) MG tablet Take 1 tablet (325 mg total) by mouth 2 (two) times daily for 7 days. 02/18/24 04/22/25  Trudy Anthony HERO, MD  lisinopril  (ZESTRIL ) 20 MG tablet Take 1 tablet (20 mg total) by mouth daily for 7 days. 02/19/24 04/22/25  Trudy Anthony HERO, MD  metoprolol  succinate (TOPROL -XL) 100 MG 24 hr tablet Take 1 tablet (100 mg total) by mouth daily for 7 days. Take with or immediately following a meal. 02/19/24 04/22/25  Trudy Anthony HERO, MD  nystatin  (MYCOSTATIN /NYSTOP ) powder Apply 1 Application topically 3 (three) times daily. 07/11/24   Bradler, Evan K, MD  polyethylene glycol powder (GLYCOLAX /MIRALAX ) 17 GM/SCOOP powder Take 17 g by mouth daily as needed for mild constipation or moderate constipation. 03/11/24   Caleen Qualia, MD  predniSONE  (STERAPRED UNI-PAK 21 TAB) 10 MG (21) TBPK tablet As directed on packaging 07/11/24   Bradler, Evan K, MD  torsemide  (DEMADEX ) 20 MG tablet Take 1 tablet (20 mg total) by mouth daily. 03/12/24   Caleen Qualia, MD    Physical Exam: Vitals:   07/17/24 1830 07/17/24 1900 07/17/24 1930 07/17/24 1942  BP: (!) 170/62 (!) 171/64 (!) 168/77   Pulse: 75 78 77   Resp: (!) 22 (!) 25 20    Temp:    98.4 F (36.9 C)  TempSrc:    Oral  SpO2: 100% 98% 99%   Weight:      Height:      Patient seen approximately 7:20 PM with RN Dorothyann in room 14 in the emergency department Constitutional:  Vital Signs as per [facility] than three noted] No Acute Distress Eyes:  Pink Conjunctiva and no Ptosis Neck:     Trachea Midline, Neck Symmetric             Thyroid  without tenderness, palpable masses or nodules Respiratory:   Respiratory Intermittently Labored but No Use of Respiratory Muscles,No  Intercostal Retractions           Lungs with occasional widespread wheezes and reduced lung sounds at bases but no crackles Cardiovascular:   Heart Auscultated: Regular Regular without any added sounds or murmurs            Pitting edema bilateral very asymmetrical right much greater  than left patient of note has scar from right sided knee replacement Gastrointestinal:  Abdomen soft and nontender without palpable masses, guarding or rebound  No Palpable Splenomegaly or Hepatomegaly Psychiatric:  Patient Orientated to Time, Place and Person Patient with appropriate mood and affect Recent and Remote Memory Intact  Data Reviewed: Labs, Radiology, ECG as detailed in HPI and A/P   Assessment and Plan: * Acute on chronic diastolic (congestive) heart failure (HCC)  Acute diastolic heart failure Patient presents with elevated brain atretic peptide of 384 new onset dependent edema and orthopnea Is on outpatient p.o. Lasix  as per last outpatient note Grade 2 diastolic dysfunction as per the patient's last echo in March Will continue diuresis with 40 mg IV twice daily and follow weights and clinical examination  Dyspnea Dyspnea Saturating 95% on room air without any evidence of acute respiratory failure Likely multifactorial with mucous plugging, asthma exacerbation (wheezing, noted in her last outpatient visit and she is on Trelegy as outpatient), acute diastolic heart failure  exacerbation, CT pulmonary angiogram was negative for any evidence of pneumonia or widespread pulmonary edema but did reveal lower lobe mucous plugging and atelectasis possibly (possibly predisposed by her PD) Plan: For acute asthma exacerbation we will start p.o. prednisone  as well as continue LABA LAMA with scheduled and then as needed albuterol , chest physiotherapy for her mucous plugging with pulmonology consultation if these conservative measures do not result in resolution of her mucous plug, acute diastolic heart failure management as per below, continuous pulse oximetry, VBG ordered to exclude any uncompensated CO2 retention although no encephalopathy currently  Atelectasis Mucous plugging Ordered chest PT Pulmonology consultation keep in view bronchoscopy if this conservative measure does not resolve the issue Holding off inhaled mucolytics given absence of evidence in acute patients Incentive Spirometry  HTN (hypertension) Hypertension Hypertensive urgency without any chest pain or headache systolic blood pressure in the 170s likely secondary to her acute hospitalization acute distress Continue lisinopril  20 mg daily and metoprolol  100 mg extended release The patient's dyspnea does not appear beta-blocker related  CAD (coronary artery disease) Coronary artery disease Chest pain-free continue Plavix  75 mg as outpatient troponins were negative ECG without any acute ischemic changes  Parkinson disease (HCC) Parkinson's disease Has ambulatory dysfunction at baseline due to this continue her carbidopa  levodopa  25/100 as twice daily which was confirmed with her last outpatient primary care visit note in care anywhere   OSA (obstructive sleep apnea) Obstructive sleep apnea Continue nocturnal CPAP   Dependent edema Asymmetric dependent edema Although the patient has an acute diastolic heart failure exacerbation she has not acentric metric edema which is atypical for her ordering  duplex Doppler to exclude a coexisting DVT although PE negative on her CT pulmonary angiogram   Macrocytosis Macrocytosis Patient's MCV is 100.3 Ordered B12 and folic acid   Hypocalcemia Hypocalcemia Normal when corrected for the patient's low albumin   Sarcoidosis Sarcoidosis No new lymphadenopathy on her CT pulmonary angiogram do not believe this is an acute sarcoid exacerbation  URTI (acute upper respiratory infection) Upper respiratory tract symptoms Respiratory viral panel ordered   Aortic atherosclerosis (HCC) Aortic atherosclerosis and dilatation Incidental finding asymptomatic for outpatient follow-up   Enoxaparin  DVT PPx    Advance Care Planning:   Code Status: Full Code   Consults: Nil  Family Communication:  Of note the patient's CODE STATUS was DNR her last 2 admissions I do not see any advance care planning paperwork and the patient was very clear she was full code  when I spoke to her this evening patient's family member pulled Malcolm did not answer when I called, classical answering machine did not appear to be immobile, accordingly placed the patient as full code she is a independent community dwelling 89 year old and had decision-making capacity  Severity of Illness: The appropriate patient status for this patient is INPATIENT. Inpatient status is judged to be reasonable and necessary in order to provide the required intensity of service to ensure the patient's safety. The patient's presenting symptoms, physical exam findings, and initial radiographic and laboratory data in the context of their chronic comorbidities is felt to place them at high risk for further clinical deterioration. Furthermore, it is not anticipated that the patient will be medically stable for discharge from the hospital within 2 midnights of admission.   * I certify that at the point of admission it is my clinical judgment that the patient will require inpatient hospital care spanning  beyond 2 midnights from the point of admission due to high intensity of service, high risk for further deterioration and high frequency of surveillance required.*  S/T Ms. Malcolm sister @8 :23 PM called back, discussed code status and will re-visit with sister when visits tomorrow.  Author: Prentice JAYSON Lowenstein, MD 07/17/2024 8:11 PM  For on call review [email or web address].

## 2024-07-18 DIAGNOSIS — I5033 Acute on chronic diastolic (congestive) heart failure: Secondary | ICD-10-CM

## 2024-07-18 DIAGNOSIS — Z515 Encounter for palliative care: Secondary | ICD-10-CM

## 2024-07-18 LAB — VITAMIN B12: Vitamin B-12: 291 pg/mL (ref 180–914)

## 2024-07-18 LAB — CBC
HCT: 37.7 % (ref 36.0–46.0)
Hemoglobin: 12.8 g/dL (ref 12.0–15.0)
MCH: 33.4 pg (ref 26.0–34.0)
MCHC: 34 g/dL (ref 30.0–36.0)
MCV: 98.4 fL (ref 80.0–100.0)
Platelets: 180 K/uL (ref 150–400)
RBC: 3.83 MIL/uL — ABNORMAL LOW (ref 3.87–5.11)
RDW: 14.6 % (ref 11.5–15.5)
WBC: 8.1 K/uL (ref 4.0–10.5)
nRBC: 0 % (ref 0.0–0.2)

## 2024-07-18 LAB — RESPIRATORY PANEL BY PCR

## 2024-07-18 LAB — COMPREHENSIVE METABOLIC PANEL WITH GFR
ALT: <5 U/L (ref 0–44)
AST: 12 U/L — ABNORMAL LOW (ref 15–41)
Albumin: 3.6 g/dL (ref 3.5–5.0)
Alkaline Phosphatase: 92 U/L (ref 38–126)
Anion gap: 10 (ref 5–15)
BUN: 17 mg/dL (ref 8–23)
CO2: 28 mmol/L (ref 22–32)
Calcium: 8.5 mg/dL — ABNORMAL LOW (ref 8.9–10.3)
Chloride: 104 mmol/L (ref 98–111)
Creatinine, Ser: 0.93 mg/dL (ref 0.44–1.00)
GFR, Estimated: 56 mL/min — ABNORMAL LOW (ref >60)
Glucose, Bld: 120 mg/dL — ABNORMAL HIGH (ref 70–99)
Potassium: 3.4 mmol/L — ABNORMAL LOW (ref 3.5–5.1)
Sodium: 142 mmol/L (ref 135–145)
Total Bilirubin: 0.9 mg/dL (ref 0.0–1.2)
Total Protein: 6.7 g/dL (ref 6.5–8.1)

## 2024-07-18 LAB — HEMOGLOBIN A1C
Hgb A1c MFr Bld: 5.7 % — ABNORMAL HIGH (ref 4.8–5.6)
Mean Plasma Glucose: 116.89 mg/dL

## 2024-07-18 LAB — FOLATE: Folate: 21.3 ng/mL (ref >5.9)

## 2024-07-18 LAB — HIV ANTIBODY (ROUTINE TESTING W REFLEX): HIV Screen 4th Generation wRfx: NONREACTIVE

## 2024-07-18 LAB — VITAMIN D 25 HYDROXY (VIT D DEFICIENCY, FRACTURES): Vit D, 25-Hydroxy: 90.27 ng/mL (ref 30–100)

## 2024-07-18 LAB — MAGNESIUM: Magnesium: 1.9 mg/dL (ref 1.7–2.4)

## 2024-07-18 LAB — PHOSPHORUS: Phosphorus: 4 mg/dL (ref 2.5–4.6)

## 2024-07-18 MED ORDER — VITAMIN B-12 1000 MCG PO TABS
1000.0000 ug | ORAL_TABLET | Freq: Every day | ORAL | Status: DC
  Administered 2024-07-18 – 2024-07-20 (×3): 1000 ug via ORAL
  Filled 2024-07-18 (×3): qty 1

## 2024-07-18 MED ORDER — POTASSIUM CHLORIDE CRYS ER 20 MEQ PO TBCR
40.0000 meq | EXTENDED_RELEASE_TABLET | Freq: Once | ORAL | Status: AC
  Administered 2024-07-18: 40 meq via ORAL
  Filled 2024-07-18: qty 2

## 2024-07-18 MED ORDER — ORAL CARE MOUTH RINSE: 15.0000 mL | OROMUCOSAL | Status: DC | PRN

## 2024-07-18 MED ORDER — GUAIFENESIN-DM 100-10 MG/5ML PO SYRP
5.0000 mL | ORAL_SOLUTION | ORAL | Status: DC | PRN
  Administered 2024-07-18: 5 mL via ORAL
  Filled 2024-07-18: qty 10

## 2024-07-18 MED ORDER — GUAIFENESIN-DM 100-10 MG/5ML PO SYRP
5.0000 mL | ORAL_SOLUTION | Freq: Four times a day (QID) | ORAL | Status: DC | PRN
  Administered 2024-07-18 – 2024-07-19 (×2): 5 mL via ORAL
  Filled 2024-07-18 (×2): qty 10

## 2024-07-18 MED ORDER — GUAIFENESIN ER 600 MG PO TB12
600.0000 mg | ORAL_TABLET | Freq: Two times a day (BID) | ORAL | Status: DC
  Administered 2024-07-18 – 2024-07-20 (×5): 600 mg via ORAL
  Filled 2024-07-18 (×5): qty 1

## 2024-07-18 NOTE — Progress Notes (Signed)
 Physical Therapy Evaluation Patient Details Name: Sheila Long MRN: [id number] DOB: 1924-06-23 Today's Date: 07/18/2024  History of Present Illness  Pt is a 89 y/o female admitted secondary to SOB and found to have acute on chronic CHF. PMH including but not limited to COPD, Parkinson's disease chronic diastolic heart failure, coronary artery disease, gastroesophageal reflux disease, sarcoidosis and ambulatory dysfunction.  Clinical Impression  Pt presented supine in bed with HOB elevated, awake and willing to participate in therapy session. Prior to admission, pt reported that she could ambulate within her home independently and required assistance for ADLs/IALDs. Pt reported that family members and a caregiver assist her. She stated that she is alone during the day for a few hours and completely alone at night. At the time of evaluation, pt was able to complete bed mobility (rolling bilateral several times for pericare and bed linen change) with min A and completed a full sit-up to long sit in bed with CGA. Pt then adamant about eating breakfast prior to transferring OOB. Based on pt's performance and uncertainty of support at home, PT currently recommending pt d/c to SNF for short-term rehab to maximize her safety and independence with functional mobility prior to returning home. Pt would continue to benefit from skilled physical therapy services at this time while admitted and after d/c to address the below listed limitations in order to improve overall safety and independence with functional mobility.       If plan is discharge home, recommend the following: A lot of help with walking and/or transfers;Two people to help with walking and/or transfers;A lot of help with bathing/dressing/bathroom;Assistance with cooking/housework;Assist for transportation   Can travel by private vehicle   No    Equipment Recommendations Other (comment) (defer to next venue of care)  Recommendations for  Other Services       Functional Status Assessment Patient has had a recent decline in their functional status and demonstrates the ability to make significant improvements in function in a reasonable and predictable amount of time.     Precautions / Restrictions Precautions Precautions: Fall Recall of Precautions/Restrictions: Impaired Restrictions Weight Bearing Restrictions Per Provider Order: No      Mobility  Bed Mobility Overal bed mobility: Needs Assistance Bed Mobility: Rolling, Sit to Supine Rolling: Min assist, Used rails     Sit to supine: Contact guard assist, Used rails   General bed mobility comments: pt's bed was saturated in urine upon PT arrival with NT present in room. pt able to roll bilaterally several times for pericare and linen change with significantly increased time and effort, min A and use of bed rails. pt was also able to complete supine to sit (long sitting in bed) with CGA for safety    Transfers                   General transfer comment: pt deferring at this time as her breakfast tray was present and she wanted to wait until after she ate    Ambulation/Gait                  Stairs            Wheelchair Mobility     Tilt Bed    Modified Rankin (Stroke Patients Only)       Balance Overall balance assessment: Needs assistance Sitting-balance support: Feet supported Sitting balance-Leahy Scale: Fair  Pertinent Vitals/Pain Pain Assessment Pain Assessment: No/denies pain    Home Living Family/patient expects to be discharged to:: Private residence Living Arrangements: Alone Available Help at Discharge: Friend(s);Personal care attendant;Available PRN/intermittently;Other (Comment) (has family and a caregiver/aide with her most of the time. pt reporting she is alone a few hours per day and alone at night.)               Additional Comments: pt VERY HOH  and difficult to obtain history information with no family or caregivers present on eval    Prior Function Prior Level of Function : Needs assist             Mobility Comments: was previously ambulatory with walker. ADLs Comments: pt requires assistance from her caregiver for bathing, pt stating she can dress herself     Extremity/Trunk Assessment   Upper Extremity Assessment Upper Extremity Assessment: Defer to OT evaluation    Lower Extremity Assessment Lower Extremity Assessment: Difficult to assess due to impaired cognition;Generalized weakness       Communication   Communication Communication: Impaired Factors Affecting Communication: Hearing impaired    Cognition Arousal: Alert Behavior During Therapy: Flat affect, Restless   PT - Cognitive impairments: Difficult to assess Difficult to assess due to: Hard of hearing/deaf (extremely HOH)                     PT - Cognition Comments: pt restless throughout and requiring VCs for safety with functional mobility; very HOH and difficult to fully assess Following commands: Impaired Following commands impaired: Follows one step commands with increased time     Cueing Cueing Techniques: Verbal cues, Gestural cues     General Comments      Exercises     Assessment/Plan    PT Assessment Patient needs continued PT services  PT Problem List Decreased strength;Decreased range of motion;Decreased activity tolerance;Decreased balance;Decreased mobility;Decreased coordination;Decreased cognition;Decreased knowledge of use of DME;Decreased safety awareness;Decreased knowledge of precautions       PT Treatment Interventions DME instruction;Gait training;Stair training;Functional mobility training;Therapeutic activities;Therapeutic exercise;Balance training;Neuromuscular re-education;Patient/family education    PT Goals (Current goals can be found in the Care Plan section)  Acute Rehab PT Goals Patient Stated  Goal: to eat her breakfast PT Goal Formulation: With patient Time For Goal Achievement: 08/01/24 Potential to Achieve Goals: Fair    Frequency Min 3X/week     Co-evaluation               AM-PAC PT 6 Clicks Mobility  Outcome Measure Help needed turning from your back to your side while in a flat bed without using bedrails?: A Little Help needed moving from lying on your back to sitting on the side of a flat bed without using bedrails?: A Little Help needed moving to and from a bed to a chair (including a wheelchair)?: A Lot Help needed standing up from a chair using your arms (e.g., wheelchair or bedside chair)?: A Lot Help needed to walk in hospital room?: A Lot Help needed climbing 3-5 steps with a railing? : Total 6 Click Score: 13    End of Session   Activity Tolerance: Patient tolerated treatment well Patient left: in bed;with call bell/phone within reach;with bed alarm set;Other (comment) (RN present in room) Nurse Communication: Mobility status PT Visit Diagnosis: Other abnormalities of gait and mobility (R26.89)    Time: 9142-9079 PT Time Calculation (min) (ACUTE ONLY): 23 min   Charges:   PT Evaluation $PT Eval Moderate  Complexity: 1 Mod PT Treatments $Therapeutic Activity: 8-22 mins PT General Charges $$ ACUTE PT VISIT: 1 Visit         Delon DELENA KLEIN, DPT  Acute Rehabilitation Services Office [phone]   Delon HERO Vennela Jutte 07/18/2024, 10:37 AM

## 2024-07-18 NOTE — Evaluation (Signed)
 Occupational Therapy Evaluation Patient Details Name: Sheila Long MRN: [id number] DOB: 01-Dec-1924 Today's Date: 07/18/2024   History of Present Illness   Pt is a 89 y/o female admitted secondary to SOB and found to have acute on chronic CHF. PMH including but not limited to COPD, Parkinson's disease chronic diastolic heart failure, coronary artery disease, gastroesophageal reflux disease, sarcoidosis and ambulatory dysfunction.     Clinical Impressions Pt was seen for OT evaluation this date. PTA, pt reported that she could ambulate within her home independently and required assistance for ADLs/IALDs. Pt reported that family members and a caregiver assist her. She stated that she is alone during the day for a few hours and completely alone at night at her preference stating I'm not paying someone to watch me sleep.  Pt presents to acute OT demonstrating impaired ADL performance and functional mobility 2/2 mild weakness and low activity tolerance. Pt currently requires CGA for bed mobility tasks with increased time/effort. CGA for STS from EOB to RW and for SPT to [facility]. Pt on 2L 02 via [place] on entry with sp02 98%, placed on RA for session with lowest reading of 89% after ambulation and toileting tasks. Pt required SBA for ambulation ~20 ft within the room using RW with no LOB noted. She demo all aspects of toileting with supervision. Pt reports she feels she would do best on DC if she returns home with continued caregiver assist. Placed back on 1L 02 via [place] at end of session with sp02 >90% upon OT exit.  Pt would benefit from skilled OT services to address noted impairments and functional limitations to maximize safety and independence while minimizing falls risk and caregiver burden. Recommend HHOT on DC to continue maximizing her strength and endurance.      If plan is discharge home, recommend the following:   A little help with walking and/or transfers;A little help with  bathing/dressing/bathroom;Help with stairs or ramp for entrance     Functional Status Assessment   Patient has had a recent decline in their functional status and demonstrates the ability to make significant improvements in function in a reasonable and predictable amount of time.     Equipment Recommendations   None recommended by OT (has needed DME)     Recommendations for Other Services         Precautions/Restrictions   Precautions Precautions: Fall Recall of Precautions/Restrictions: Impaired Restrictions Weight Bearing Restrictions Per Provider Order: No     Mobility Bed Mobility Overal bed mobility: Needs Assistance Bed Mobility: Supine to Sit     Supine to sit: Used rails, HOB elevated, Contact guard     General bed mobility comments: increased time and effort to reach EOB, no physical assist provided with HOB elevated    Transfers Overall transfer level: Needs assistance Equipment used: Rolling walker (2 wheels) Transfers: Sit to/from Stand Sit to Stand: Contact guard assist, Supervision           General transfer comment: initially CGA progressing to supervision from [facility] to RW and ambulated ~16-20 ft using RW with SBA      Balance Overall balance assessment: Needs assistance Sitting-balance support: Feet supported Sitting balance-Leahy Scale: Good     Standing balance support: During functional activity, Single extremity supported, Reliant on assistive device for balance Standing balance-Leahy Scale: Fair Standing balance comment: RW use, SBA for ambulation, no LOB throughout  ADL either performed or assessed with clinical judgement   ADL Overall ADL's : Needs assistance/impaired                         Toilet Transfer: Contact guard assist;BSC/3in1;Rolling walker (2 wheels);Stand-pivot   Toileting- Architect and Hygiene: Supervision/safety;Sit to/from stand;Sitting/lateral  lean Toileting - Clothing Manipulation Details (indicate cue type and reason): from [facility]     Functional mobility during ADLs: Contact guard assist;Rolling walker (2 wheels)       Vision         Perception         Praxis         Pertinent Vitals/Pain Pain Assessment Pain Assessment: No/denies pain     Extremity/Trunk Assessment Upper Extremity Assessment Upper Extremity Assessment: Overall WFL for tasks assessed   Lower Extremity Assessment Lower Extremity Assessment: Generalized weakness       Communication Communication Communication: Impaired Factors Affecting Communication: Hearing impaired   Cognition Arousal: Alert Behavior During Therapy: WFL for tasks assessed/performed                                 Following commands: Impaired Following commands impaired: Only follows one step commands consistently     Cueing  General Comments   Cueing Techniques: Verbal cues;Gestural cues  on 2L on entry with sp02 98%, removed for session and dropped to 89% on RA with toileting and ambulation tasks; placed back on 1L with nurse aware sp02 97%   Exercises Other Exercises Other Exercises: Edu on role of OT in acute setting.   Shoulder Instructions      Home Living Family/patient expects to be discharged to:: Private residence Living Arrangements: Alone Available Help at Discharge: Friend(s);Personal care attendant;Available PRN/intermittently;Other (Comment) (has family and a caregiver/aide with her most of the time. pt reporting she is alone a few hours per day and alone at night.)                             Additional Comments: pt VERY HOH and difficult to obtain history information with no family or caregivers present on eval      Prior Functioning/Environment Prior Level of Function : Needs assist             Mobility Comments: was previously ambulatory with walker. ADLs Comments: pt requires assistance from her  caregiver for bathing, pt stating she can dress herself    OT Problem List: Decreased strength;Decreased activity tolerance   OT Treatment/Interventions: Self-care/ADL training;Therapeutic exercise;Patient/family education;Balance training;Energy conservation;Therapeutic activities      OT Goals(Current goals can be found in the care plan section)   Acute Rehab OT Goals Patient Stated Goal: go home OT Goal Formulation: With patient Time For Goal Achievement: 08/01/24 Potential to Achieve Goals: Good ADL Goals Pt Will Perform Lower Body Bathing: sitting/lateral leans;sit to/from stand;with min assist Pt Will Perform Lower Body Dressing: with min assist;sit to/from stand;sitting/lateral leans Pt Will Transfer to Toilet: with supervision;regular height toilet;ambulating   OT Frequency:  Min 2X/week    Co-evaluation              AM-PAC OT 6 Clicks Daily Activity     Outcome Measure Help from another person eating meals?: None Help from another person taking care of personal grooming?: None Help from another person toileting, which includes using toliet, bedpan,  or urinal?: A Little Help from another person bathing (including washing, rinsing, drying)?: A Little Help from another person to put on and taking off regular upper body clothing?: None Help from another person to put on and taking off regular lower body clothing?: A Little 6 Click Score: 21   End of Session Equipment Utilized During Treatment: Rolling walker (2 wheels) Nurse Communication: Mobility status  Activity Tolerance: Patient tolerated treatment well Patient left: in bed;with call bell/phone within reach;with bed alarm set  OT Visit Diagnosis: Other abnormalities of gait and mobility (R26.89)                Time: 8882-8857 OT Time Calculation (min): 25 min Charges:  OT General Charges $OT Visit: 1 Visit OT Evaluation $OT Eval Moderate Complexity: 1 Mod OT Treatments $Self Care/Home Management :  8-22 mins Nahia Nissan, OTR/L 07/18/24, 1:44 PM  Findley Vi E Jerre Diguglielmo 07/18/2024, 1:40 PM

## 2024-07-18 NOTE — Consult Note (Signed)
 Palliative Medicine  Name: Sheila Long Date: 07/18/2024 MRN: [id number]  DOB: May 28, 1924  Patient Care Team: Sheila Bail, MD as PCP - General (Internal Medicine) Sheila Louanne MATSU, MD (General Surgery) Sheila Sigrid HERO, MD (Internal Medicine)    REASON FOR CONSULTATION: Sheila Long is a 89 y.o. female with multiple medical problems including Parkinson's, diastolic dysfunction with history of CHF, CAD, who was admitted the hospital on 07/17/2024 with CHF exacerbation.  Palliative care was consulted to address goals.  SOCIAL HISTORY:     reports that she has never smoked. She has never used smokeless tobacco. She reports that she does not drink alcohol and does not use drugs.  Patient is widowed.  She has no children.  She lives at home alone but has daytime caregivers.  Her neighbor, Sheila Long, is her primary caregiver. 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  ADVANCE DIRECTIVES:  Not on file  CODE STATUS: DNR  PAST MEDICAL HISTORY: Past Medical History:  Diagnosis Date   Allergic rhinitis due to pollen    Anxiety    Aortic atherosclerosis (HCC)    CHF (congestive heart failure) (HCC)    Coronary artery disease    Stent in 2000's ([place])   Dyspnea    GERD (gastroesophageal reflux disease)    Hard of hearing    Hypertension    Neuropathy    Obstructive sleep apnea    Osteoarthritis of both knees    Peripheral vascular disease (HCC)    Sarcoidosis    Thyroid  nodule     PAST SURGICAL HISTORY:  Past Surgical History:  Procedure Laterality Date   ABDOMINAL HYSTERECTOMY     BIOPSY THYROID   2014   Dr Milissa   BREAST EXCISIONAL BIOPSY Left 1980s   surgical bx neg   BREAST EXCISIONAL BIOPSY Left 1980s   benign   CARDIOVASCULAR STRESS TEST  05/12/2012   with dobutamine---negative. Normal LV function   CORONARY ANGIOPLASTY  2006    ESOPHAGOGASTRODUODENOSCOPY (EGD) WITH PROPOFOL  N/A 04/03/2020   Procedure: ESOPHAGOGASTRODUODENOSCOPY (EGD) WITH PROPOFOL ;  Surgeon: Therisa Bi, MD;  Location[facility];  Service: Gastroenterology;  Laterality: N/A;   JOINT REPLACEMENT Left 2000   hip   JOINT REPLACEMENT Right 2007   hip   KNEE ARTHROPLASTY Right 06/23/2017   Procedure: COMPUTER ASSISTED TOTAL KNEE ARTHROPLASTY;  Surgeon: Mardee Lynwood SQUIBB, MD;  Location: [facility] ORS;  Service: Orthopedics;  Laterality: Right;    HEMATOLOGY/ONCOLOGY HISTORY:  Oncology History   No history exists.    ALLERGIES:  is allergic to tramadol , sulfa antibiotics, and zocor [simvastatin].  MEDICATIONS:  Current Facility-Administered Medications  Medication Dose Route Frequency Provider Last Rate Last Admin   acetaminophen  (TYLENOL ) tablet 650 mg  650 mg Oral Q6H PRN Core, Prentice BROCKS, MD       Or   acetaminophen  (TYLENOL ) suppository 650 mg  650 mg Rectal Q6H PRN Core, Prentice BROCKS, MD       albuterol  (PROVENTIL ) (2.5 MG/3ML) 0.083% nebulizer solution 2.5 mg  2.5 mg Nebulization Q2H PRN Core, Prentice BROCKS, MD       carbidopa -levodopa  (SINEMET  IR) 25-100 MG per tablet immediate release 1 tablet  1 tablet Oral BID Core, Prentice BROCKS, MD   1 tablet at 07/18/24 9090   clopidogrel  (PLAVIX ) tablet 75 mg  75 mg Oral Daily Core, Prentice BROCKS, MD   75 mg at 07/18/24 9090   cyanocobalamin  (VITAMIN  B12) tablet 1,000 mcg  1,000 mcg Oral Daily Von Bellis, MD   1,000 mcg at 07/18/24 9081   enoxaparin  (LOVENOX ) injection 40 mg  40 mg Subcutaneous Q24H Core, Prentice BROCKS, MD   40 mg at 07/17/24 2223   escitalopram  (LEXAPRO ) tablet 5 mg  5 mg Oral Daily Core, Prentice BROCKS, MD   5 mg at 07/18/24 9091   folic acid  (FOLVITE ) tablet 1 mg  1 mg Oral Daily Core, Prentice BROCKS, MD   1 mg at 07/18/24 9090   furosemide  (LASIX ) injection 40 mg  40 mg Intravenous Q12H Core, Prentice BROCKS, MD   40 mg at 07/18/24 1323   guaiFENesin  (MUCINEX ) 12 hr tablet 600 mg  600 mg Oral BID Von Bellis, MD   600 mg at  07/18/24 9090   guaiFENesin -dextromethorphan  (ROBITUSSIN DM) 100-10 MG/5ML syrup 5 mL  5 mL Oral Q6H PRN Von Bellis, MD       latanoprost  (XALATAN ) 0.005 % ophthalmic solution 1 drop  1 drop Both Eyes QHS Core, Prentice BROCKS, MD   1 drop at 07/17/24 2224   lisinopril  (ZESTRIL ) tablet 20 mg  20 mg Oral Daily Core, Prentice BROCKS, MD   20 mg at 07/18/24 9090   metoprolol  succinate (TOPROL -XL) 24 hr tablet 100 mg  100 mg Oral Daily Core, Prentice BROCKS, MD   100 mg at 07/18/24 0908   ondansetron  (ZOFRAN ) tablet 4 mg  4 mg Oral Q6H PRN Core, Prentice BROCKS, MD       Or   ondansetron  (ZOFRAN ) injection 4 mg  4 mg Intravenous Q6H PRN Core, Prentice BROCKS, MD       Oral care mouth rinse  15 mL Mouth Rinse PRN Jesus America, NP       potassium chloride  SA (KLOR-CON  M) CR tablet 40 mEq  40 mEq Oral Once Von Bellis, MD       predniSONE  (DELTASONE ) tablet 40 mg  40 mg Oral Q breakfast Core, Prentice BROCKS, MD   40 mg at 07/18/24 9090   senna-docusate (Senokot-S) tablet 1 tablet  1 tablet Oral QHS PRN Core, Prentice BROCKS, MD       umeclidinium-vilanterol (ANORO ELLIPTA ) 62.5-25 MCG/ACT 1 puff  1 puff Inhalation Daily Core, Prentice BROCKS, MD   1 puff at 07/18/24 0911    VITAL SIGNS: BP 109/61 (BP Location: Left Arm)   Pulse 70   Temp 98.3 F (36.8 C) (Oral)   Resp 16   Ht 5' 4 (1.626 m)   Wt 187 lb 6.3 oz (85 kg)   SpO2 99%   BMI 32.17 kg/m  Filed Weights   07/17/24 1543  Weight: 187 lb 6.3 oz (85 kg)    Estimated body mass index is 32.17 kg/m as calculated from the following:   Height as of this encounter: 5' 4 (1.626 m).   Weight as of this encounter: 187 lb 6.3 oz (85 kg).  LABS: CBC:    Component Value Date/Time   WBC 8.1 07/18/2024 0425   HGB 12.8 07/18/2024 0425   HGB 15.1 03/23/2015 0842   HCT 37.7 07/18/2024 0425   HCT 46.0 03/23/2015 0842   PLT 180 07/18/2024 0425   PLT 157 03/23/2015 0842   MCV 98.4 07/18/2024 0425   MCV 100 03/23/2015 0842   NEUTROABS 3.5 07/11/2024 1514   LYMPHSABS 1.7 07/11/2024 1514    MONOABS 0.5 07/11/2024 1514   EOSABS 0.5 07/11/2024 1514   BASOSABS 0.0 07/11/2024 1514   Comprehensive Metabolic Panel:  Component Value Date/Time   NA 142 07/18/2024 0425   NA 141 03/23/2015 0842   K 3.4 (L) 07/18/2024 0425   K 4.0 03/23/2015 0842   CL 104 07/18/2024 0425   CL 107 03/23/2015 0842   CO2 28 07/18/2024 0425   CO2 26 03/23/2015 0842   BUN 17 07/18/2024 0425   BUN 17 03/23/2015 0842   CREATININE 0.93 07/18/2024 0425   CREATININE 1.05 (H) 03/23/2015 0842   GLUCOSE 120 (H) 07/18/2024 0425   GLUCOSE 126 (H) 03/23/2015 0842   CALCIUM 8.5 (L) 07/18/2024 0425   CALCIUM 9.2 03/23/2015 0842   AST 12 (L) 07/18/2024 0425   AST 26 03/23/2015 0842   ALT <5 07/18/2024 0425   ALT 18 03/23/2015 0842   ALKPHOS 92 07/18/2024 0425   ALKPHOS 78 03/23/2015 0842   BILITOT 0.9 07/18/2024 0425   BILITOT 1.1 03/23/2015 0842   PROT 6.7 07/18/2024 0425   PROT 7.1 03/23/2015 0842   ALBUMIN 3.6 07/18/2024 0425   ALBUMIN 4.2 03/23/2015 0842    RADIOGRAPHIC STUDIES: US  Venous Img Lower Bilateral (DVT) Result Date: 07/17/2024 CLINICAL DATA:  Lower extremity swelling. EXAM: BILATERAL LOWER EXTREMITY VENOUS DOPPLER ULTRASOUND TECHNIQUE: Gray-scale sonography with compression, as well as color and duplex ultrasound, were performed to evaluate the deep venous system(s) from the level of the common femoral vein through the popliteal and proximal calf veins. COMPARISON:  None Available. FINDINGS: VENOUS Normal compressibility of the common femoral, superficial femoral, and popliteal veins, as well as the visualized calf veins. Visualized portions of profunda femoral vein and great saphenous vein unremarkable. No filling defects to suggest DVT on grayscale or color Doppler imaging. Doppler waveforms show normal direction of venous flow, normal respiratory plasticity and response to augmentation. Limited views of the contralateral common femoral vein are unremarkable. OTHER None. Limitations:  Somewhat limited visualization of the calf veins. IMPRESSION: No evidence of a deep venous thrombosis of either lower extremity. Electronically Signed   By: Alm Parkins M.D.   On: 07/17/2024 20:47   CT Angio Chest PE W and/or Wo Contrast Result Date: 07/17/2024 CLINICAL DATA:  History of sarcoidosis. Short of breath with chest congestion and pain. EXAM: CT ANGIOGRAPHY CHEST WITH CONTRAST TECHNIQUE: Multidetector CT imaging of the chest was performed using the standard protocol during bolus administration of intravenous contrast. Multiplanar CT image reconstructions and MIPs were obtained to evaluate the vascular anatomy. RADIATION DOSE REDUCTION: This exam was performed according to the departmental dose-optimization program which includes automated exposure control, adjustment of the mA and/or kV according to patient size and/or use of iterative reconstruction technique. CONTRAST:  75mL OMNIPAQUE  IOHEXOL  350 MG/ML SOLN COMPARISON:  01/06/2023. FINDINGS: Cardiovascular: Pulmonary arteries to the proximal segmental level are well opacified. There is no evidence of a pulmonary embolism. Heart is normal in size. No pericardial effusion. Aorta is minimally opacified. Ascending aorta measures 4 cm in diameter. No convincing dissection. Stable atherosclerosis. Mediastinum/Nodes: No neck base, mediastinal or hilar masses. No enlarged lymph nodes. Trachea and esophagus are unremarkable. Moderate hiatal hernia. Lungs/Pleura: Secretions/mucus plugging noted in lower lobe bronchi, left greater than right. Trachea and bronchi are partly flattened consistent with the exam being expiratory in phase. There are linear and patchy opacities in the left lower lobe consistent with atelectasis, associated with volume loss. Remainder of the lungs is essentially clear. Trace left pleural effusion. No right pleural effusion. No pneumothorax. Upper Abdomen: No acute findings.  Aortic atherosclerosis. Musculoskeletal: No fracture or  acute finding. No bone  lesion. Skeletal structures are demineralized. No chest wall mass. Review of the MIP images confirms the above findings. IMPRESSION: 1. No evidence of a pulmonary embolism. 2. Excretion/mucous plugging partly occluding lower lobe bronchi, greater on the left, on the left associated with atelectasis. No convincing pneumonia. No pulmonary edema. 3. Dilated ascending thoracic aorta to 4 cm. Recommend annual imaging followup by CTA or MRA. This recommendation follows 2010 ACCF/AHA/AATS/ACR/ASA/SCA/SCAI/SIR/STS/SVM Guidelines for the Diagnosis and Management of Patients with Thoracic Aortic Disease. Circulation. 2010; 121: Z733-z630. Aortic aneurysm NOS (ICD10-I71.9) 4. Aortic atherosclerosis. Aortic Atherosclerosis (ICD10-I70.0). Electronically Signed   By: Alm Parkins M.D.   On: 07/17/2024 18:29   DG Chest Portable 1 View Result Date: 07/17/2024 CLINICAL DATA:  [name].  Cough. EXAM: PORTABLE CHEST 1 VIEW COMPARISON:  07/11/2024.  CT 01/06/2023 FINDINGS: Cardiomegaly is stable. Retrocardiac hiatal hernia. Dense aortic atherosclerosis. Minor atelectasis at the left lung base. No confluent airspace disease. No pulmonary edema, pleural effusion or pneumothorax. IMPRESSION: 1. Stable cardiomegaly. No acute findings. 2. Hiatal hernia. Electronically Signed   By: Andrea Gasman M.D.   On: 07/17/2024 16:33   CT HEAD WO CONTRAST Result Date: 07/11/2024 EXAM: CT HEAD WITHOUT 07/11/2024 05:09:00 PM TECHNIQUE: CT of the head was performed without the administration of intravenous contrast. Automated exposure control, iterative reconstruction, and/or weight based adjustment of the mA/kV was utilized to reduce the radiation dose to as low as reasonably achievable. COMPARISON: 01/06/2023 CLINICAL HISTORY: Neuro deficit, acute, stroke suspected. Patient presented with chest pain, deep cough, mucus production, slurred speech, and blurred vision. FINDINGS: BRAIN AND VENTRICLES: No acute  intracranial hemorrhage. No mass effect or midline shift. No extra-axial fluid collection. Gray-white differentiation is maintained. No hydrocephalus. Stable background of mild chronic small vessel disease and generalized volume loss. ORBITS: No acute abnormality. SINUSES AND MASTOIDS: No acute abnormality. SOFT TISSUES AND SKULL: No acute skull fracture. No acute soft tissue abnormality. IMPRESSION: 1. No acute intracranial abnormality. 2. Stable background of mild chronic small vessel disease and generalized volume loss. Electronically signed by: Ryan Chess MD 07/11/2024 05:19 PM EDT RP Workstation: [id number]   DG Chest 2 View Result Date: 07/11/2024 CLINICAL DATA:  Cough, chest pain EXAM: CHEST - 2 VIEW COMPARISON:  March 07, 2024 FINDINGS: No focal consolidation.  No pleural effusions.  No pneumothorax. Unchanged mildly enlarged cardiomediastinal silhouette. No acute osseous findings. IMPRESSION: No acute airspace disease.  Unchanged cardiomegaly. Electronically Signed   By: Michaeline Blanch M.D.   On: 07/11/2024 15:41    PERFORMANCE STATUS (ECOG) : 2 - Symptomatic, <50% confined to bed  Review of Systems Unless otherwise noted, a complete review of systems is negative.  Physical Exam General: NAD Cardiovascular: regular rate and rhythm Pulmonary: clear ant fields Abdomen: soft, nontender, + bowel sounds GU: no suprapubic tenderness Extremities: no edema, no joint deformities Skin: no rashes Neurological: Weakness but otherwise nonfocal  IMPRESSION: Patient with history of Parkinson's, diastolic dysfunction, who was admitted with CHF.  Today, patient reports that she is feeling improved.  States that at baseline, she lives at home and has hired caregivers during the day.  Patient also has Meals on Wheels.  She says that her neighbor, Sheila Long, is her primary caregiver who watches after her.  Patient says she ambulates with use of a walker but is dependent on others for most of her  care.  Patient has no children and is a widow.  She says that she raised her niece and nephew.  She says her  niece, Rock Penton, is her power of attorney.  I tried to call Rock but was unable to reach her.  I did speak with patient's neighbor, Burdell Whitaker.  Per Ms. Whitaker, she is patient's primary caregiver.  She intends to take patient home at time of discharge.  Patient has been considering possibly transitioning to an assisted living facility at some point in the future.  I did recommend community palliative care following at time of discharge.  Patient verbalized agreement with current scope of treatment.  We discussed CODE STATUS.  Patient states clearly that she would not want to be resuscitated or have her life prolonged artificially on machines.  She says that when it is my time, it is my time.  Sheila Long confirms the patient has a DNR order at home on the refrigerator.  Will change CODE STATUS to reflect patient's wishes  PLAN: - Continue current scope of treatment - DNR/DNI - Recommend community palliative care at time of discharge   Time Total: 45 minutes  Visit consisted of counseling and education dealing with the complex and emotionally intense issues of symptom management and palliative care in the setting of serious and potentially life-threatening illness.Greater than 50%  of this time was spent counseling and coordinating care related to the above assessment and plan.  Signed by: Fonda Mower, PhD, NP-C

## 2024-07-18 NOTE — Plan of Care (Signed)

## 2024-07-18 NOTE — Progress Notes (Signed)
 [facility] Progress Note  [name]    FMW:[id number]  DOA: 07/17/2024     Date of Service: the patient was seen and examined on 07/18/2024  Chief Complaint  Patient presents with   [name]   Brief hospital course: 89 year old female with PMH of Parkinson's disease, chronic dHF, CAD, GERD, sarcoidosis and ambulatory dysfunction who presented with 2 weeks of dyspnea orthopnea and increasing dependent edema the patient denies any chills but does report upper respiratory symptoms.   The patient received bronchodilators and 1 dose of Lasix  in the emergency department.   EKG: sinus rhythm with ventricular rate in the low 70s with right bundle branch block QTc is 458   Past medical record review of the patient's last echocardiogram was March 2025 grade 2 diastolic dysfunction   Case was discussed with emergency department provider and the patient was subsequently admitted into the hospital    Assessment and Plan:  # Acute on chronic diastolic heart failure exacerbation Presented with dyspnea on exertion and lower extremity edema CTA negative for PE and pneumonia.  Mucous plugs in the lower lobe bronchi, no significant findings for pneumonia or pulmonary edema Negative lower extremity duplex for DVT -Mucinex  extremity gram p.o. twice daily -Lasix  40 mg IV twice daily Monitor renal functions and urine output  # Asthma exacerbation secondary to diastolic heart failure Continue supplemental O2 inhalation prn  Patient was started on prednisone  40 mg p.o. daily, which has been continued Continue inhalers F/u RVP panel, still pending   # HTN, CAD -Lisinopril  20 mg p.o. daily - Toprol -XL 100 mg p.o. daily - Plavix  75 mg daily   # Hypokalemia, mild, secondary to diuresis Potassium repleted. Monitor and replete as needed with  # Macrocytosis # Vitamin B12 level 291, goal >400, started oral supplement. Follow-up PCP to repeat vitamin B12 level  after 3 to 6 months.  # Parkinson disease, continue Sinemet   # Aortic atherosclerosis  CTA: Dilated ascending thoracic aorta to 4 cm. Recommend annual Imaging.  Incidental finding asymptomatic for outpatient follow-up   Sarcoidosis No new lymphadenopathy on her CT pulmonary angiogram do not believe this is an acute sarcoid exacerbation   Body mass index is 32.17 kg/m.  Interventions:  Diet: Heart healthy diet, fluid restriction 1.5 L/day DVT Prophylaxis: Subcutaneous Lovenox    Advance goals of care discussion: Full code  Family Communication: family was not present at bedside, at the time of interview.  The pt provided permission to discuss medical plan with the family. Opportunity was given to ask question and all questions were answered satisfactorily.   Disposition:  Pt is from Home, admitted with CHF, still on IV lasix , which precludes a safe discharge. Discharge to Home, when stable, may need 1-2 more days to improve.  Subjective: No significant events overnight.  Patient's breathing is getting better, denied any worsening of symptoms, no chest pain or palpitations.  Physical Exam: General: NAD, lying comfortably, Mild SOB Appear in no distress, affect appropriate Eyes: PERRLA ENT: Oral Mucosa Clear, moist  Neck: no JVD,  Cardiovascular: S1 and S2 Present, no Murmur,  Respiratory: Equal air entry bilaterally, mild crackles bilaterally, no wheezes Abdomen: Bowel Sound present, Soft and no tenderness,  Skin: no rashes Extremities: 2+ Pedal edema, no calf tenderness Neurologic: without any new focal findings Gait not checked due to patient safety concerns  Vitals:   07/17/24 2352 07/18/24 0419 07/18/24 0430 07/18/24 0753  BP: (!) 118/58 128/86    Pulse: 82 74  Resp: 18 14 (!) 26   Temp: 98.6 F (37 C)     TempSrc: Oral     SpO2: 100% 99% 94% 99%  Weight:      Height:        Intake/Output Summary (Last 24 hours) at 07/18/2024 0802 Last data filed at  07/18/2024 0700 Gross per 24 hour  Intake 360 ml  Output 300 ml  Net 60 ml   Filed Weights   07/17/24 1543  Weight: 85 kg    Data Reviewed: I have personally reviewed and interpreted daily labs, tele strips, imagings as discussed above. I reviewed all nursing notes, pharmacy notes, vitals, pertinent old records I have discussed plan of care as described above with RN and patient/family.  CBC: Recent Labs  Lab 07/11/24 1514 07/17/24 1545 07/18/24 0425  WBC 6.1 8.3 8.1  NEUTROABS 3.5  --   --   HGB 12.1 12.9 12.8  HCT 36.0 38.5 37.7  MCV 100.3* 100.3* 98.4  PLT 168 189 180   Basic Metabolic Panel: Recent Labs  Lab 07/11/24 1514 07/17/24 1618 07/18/24 0425  NA 139 141 142  K 4.0 4.3 3.4*  CL 108 106 104  CO2 24 27 28   GLUCOSE 120* 158* 120*  BUN 18 20 17   CREATININE 0.88 1.00 0.93  CALCIUM 8.9 8.7* 8.5*    Studies: US  Venous Img Lower Bilateral (DVT) Result Date: 07/17/2024 CLINICAL DATA:  Lower extremity swelling. EXAM: BILATERAL LOWER EXTREMITY VENOUS DOPPLER ULTRASOUND TECHNIQUE: Gray-scale sonography with compression, as well as color and duplex ultrasound, were performed to evaluate the deep venous system(s) from the level of the common femoral vein through the popliteal and proximal calf veins. COMPARISON:  None Available. FINDINGS: VENOUS Normal compressibility of the common femoral, superficial femoral, and popliteal veins, as well as the visualized calf veins. Visualized portions of profunda femoral vein and great saphenous vein unremarkable. No filling defects to suggest DVT on grayscale or color Doppler imaging. Doppler waveforms show normal direction of venous flow, normal respiratory plasticity and response to augmentation. Limited views of the contralateral common femoral vein are unremarkable. OTHER None. Limitations: Somewhat limited visualization of the calf veins. IMPRESSION: No evidence of a deep venous thrombosis of either lower extremity. Electronically  Signed   By: Alm Parkins M.D.   On: 07/17/2024 20:47   CT Angio Chest PE W and/or Wo Contrast Result Date: 07/17/2024 CLINICAL DATA:  History of sarcoidosis. Short of breath with chest congestion and pain. EXAM: CT ANGIOGRAPHY CHEST WITH CONTRAST TECHNIQUE: Multidetector CT imaging of the chest was performed using the standard protocol during bolus administration of intravenous contrast. Multiplanar CT image reconstructions and MIPs were obtained to evaluate the vascular anatomy. RADIATION DOSE REDUCTION: This exam was performed according to the departmental dose-optimization program which includes automated exposure control, adjustment of the mA and/or kV according to patient size and/or use of iterative reconstruction technique. CONTRAST:  75mL OMNIPAQUE  IOHEXOL  350 MG/ML SOLN COMPARISON:  01/06/2023. FINDINGS: Cardiovascular: Pulmonary arteries to the proximal segmental level are well opacified. There is no evidence of a pulmonary embolism. Heart is normal in size. No pericardial effusion. Aorta is minimally opacified. Ascending aorta measures 4 cm in diameter. No convincing dissection. Stable atherosclerosis. Mediastinum/Nodes: No neck base, mediastinal or hilar masses. No enlarged lymph nodes. Trachea and esophagus are unremarkable. Moderate hiatal hernia. Lungs/Pleura: Secretions/mucus plugging noted in lower lobe bronchi, left greater than right. Trachea and bronchi are partly flattened consistent with the exam being expiratory in phase. There  are linear and patchy opacities in the left lower lobe consistent with atelectasis, associated with volume loss. Remainder of the lungs is essentially clear. Trace left pleural effusion. No right pleural effusion. No pneumothorax. Upper Abdomen: No acute findings.  Aortic atherosclerosis. Musculoskeletal: No fracture or acute finding. No bone lesion. Skeletal structures are demineralized. No chest wall mass. Review of the MIP images confirms the above findings.  IMPRESSION: 1. No evidence of a pulmonary embolism. 2. Excretion/mucous plugging partly occluding lower lobe bronchi, greater on the left, on the left associated with atelectasis. No convincing pneumonia. No pulmonary edema. 3. Dilated ascending thoracic aorta to 4 cm. Recommend annual imaging followup by CTA or MRA. This recommendation follows 2010 ACCF/AHA/AATS/ACR/ASA/SCA/SCAI/SIR/STS/SVM Guidelines for the Diagnosis and Management of Patients with Thoracic Aortic Disease. Circulation. 2010; 121: Z733-z630. Aortic aneurysm NOS (ICD10-I71.9) 4. Aortic atherosclerosis. Aortic Atherosclerosis (ICD10-I70.0). Electronically Signed   By: Alm Parkins M.D.   On: 07/17/2024 18:29   DG Chest Portable 1 View Result Date: 07/17/2024 CLINICAL DATA:  [name].  Cough. EXAM: PORTABLE CHEST 1 VIEW COMPARISON:  07/11/2024.  CT 01/06/2023 FINDINGS: Cardiomegaly is stable. Retrocardiac hiatal hernia. Dense aortic atherosclerosis. Minor atelectasis at the left lung base. No confluent airspace disease. No pulmonary edema, pleural effusion or pneumothorax. IMPRESSION: 1. Stable cardiomegaly. No acute findings. 2. Hiatal hernia. Electronically Signed   By: Andrea Gasman M.D.   On: 07/17/2024 16:33    Scheduled Meds:  carbidopa -levodopa   1 tablet Oral BID   clopidogrel   75 mg Oral Daily   enoxaparin  (LOVENOX ) injection  40 mg Subcutaneous Q24H   escitalopram   5 mg Oral Daily   folic acid   1 mg Oral Daily   furosemide   40 mg Intravenous Q12H   latanoprost   1 drop Both Eyes QHS   lisinopril   20 mg Oral Daily   metoprolol  succinate  100 mg Oral Daily   predniSONE   40 mg Oral Q breakfast   umeclidinium-vilanterol  1 puff Inhalation Daily   Continuous Infusions: PRN Meds: acetaminophen  **OR** acetaminophen , albuterol , guaiFENesin -dextromethorphan , ondansetron  **OR** ondansetron  (ZOFRAN ) IV, mouth rinse, senna-docusate  Time spent: 55 minutes  Author: ELVAN SOR. MD [facility] 07/18/2024 8:02  AM  To reach On-call, see care teams to locate the attending and reach out to them via [email or web address]. If 7PM-7AM, please contact night-coverage If you still have difficulty reaching the attending provider, please page the [facility] (Director on Call) for [facility] on amion for assistance.

## 2024-07-19 DIAGNOSIS — I5033 Acute on chronic diastolic (congestive) heart failure: Secondary | ICD-10-CM | POA: Diagnosis not present

## 2024-07-19 LAB — BLOOD GAS, VENOUS
Bicarbonate: 31.6 mmol/L — ABNORMAL HIGH (ref 20.0–28.0)
O2 Saturation: 28 mmol/L — AB (ref 0.0–2.0)
Patient temperature: 37
Patient temperature: 37
pCO2, Ven: 56 mmHg (ref 44–60)
pH, Ven: 7.36 (ref 7.25–7.43)
pO2, Ven: <31.6 mmol/L — CL (ref 32–45)

## 2024-07-19 LAB — CBC
HCT: 39.6 % (ref 36.0–46.0)
Hemoglobin: 13.2 g/dL (ref 12.0–15.0)
MCH: 33.4 pg (ref 26.0–34.0)
MCHC: 33.3 g/dL (ref 30.0–36.0)
MCV: 100.3 fL — ABNORMAL HIGH (ref 80.0–100.0)
Platelets: 194 K/uL (ref 150–400)
RBC: 3.95 MIL/uL (ref 3.87–5.11)
RDW: 14.9 % (ref 11.5–15.5)
WBC: 10.3 K/uL (ref 4.0–10.5)
nRBC: 0 % (ref 0.0–0.2)

## 2024-07-19 LAB — BASIC METABOLIC PANEL WITH GFR
Anion gap: 12 (ref 5–15)
BUN: 32 mg/dL — ABNORMAL HIGH (ref 8–23)
CO2: 29 mmol/L (ref 22–32)
Calcium: 8.8 mg/dL — ABNORMAL LOW (ref 8.9–10.3)
Chloride: 99 mmol/L (ref 98–111)
Creatinine, Ser: 1.23 mg/dL — ABNORMAL HIGH (ref 0.44–1.00)
GFR, Estimated: 40 mL/min — ABNORMAL LOW (ref >60)
Glucose, Bld: 128 mg/dL — ABNORMAL HIGH (ref 70–99)
Potassium: 4.2 mmol/L (ref 3.5–5.1)
Sodium: 140 mmol/L (ref 135–145)

## 2024-07-19 LAB — MAGNESIUM: Magnesium: 2.1 mg/dL (ref 1.7–2.4)

## 2024-07-19 LAB — PHOSPHORUS: Phosphorus: 3.9 mg/dL (ref 2.5–4.6)

## 2024-07-19 MED ORDER — ADULT MULTIVITAMIN W/MINERALS CH
1.0000 | ORAL_TABLET | Freq: Every day | ORAL | Status: DC
  Administered 2024-07-20: 1 via ORAL
  Filled 2024-07-19: qty 1

## 2024-07-19 MED ORDER — ENOXAPARIN SODIUM 30 MG/0.3ML IJ SOSY
30.0000 mg | PREFILLED_SYRINGE | INTRAMUSCULAR | Status: DC
  Administered 2024-07-19: 30 mg via SUBCUTANEOUS
  Filled 2024-07-19: qty 0.3

## 2024-07-19 MED ORDER — ENSURE PLUS HIGH PROTEIN PO LIQD
237.0000 mL | Freq: Two times a day (BID) | ORAL | Status: DC
  Administered 2024-07-20: 237 mL via ORAL

## 2024-07-19 NOTE — Care Management Important Message (Signed)
 Important Message  Patient Details  Name: Sheila Long MRN: [id number] Date of Birth: October 28, 1924   Important Message Given:  Yes - Medicare IM     Rojelio [name] 07/19/2024, 12:50 PM

## 2024-07-19 NOTE — Progress Notes (Addendum)
 PROGRESS NOTE  Sheila Long FMW:[id number] DOB: [birth year] DOA: 07/17/2024 PCP: [name], MD   LOS: 2 days   Brief narrative:  Patient is a 89 year old female with PMH of Parkinson's disease, chronic dHF, CAD, GERD, sarcoidosis and ambulatory dysfunction presented to hospital with dyspnea orthopnea and increasing lower extremity edema for 2 weeks prior to presentation.  Patient was then admitted hospital for further evaluation and treatment.      Assessment/Plan: Principal Problem:   Acute on chronic diastolic (congestive) heart failure (HCC) Active Problems:   Dyspnea   HTN (hypertension)   Atelectasis   CAD (coronary artery disease)   OSA (obstructive sleep apnea)   Parkinson disease (HCC)   Aortic atherosclerosis (HCC)   URTI (acute upper respiratory infection)   Sarcoidosis   Hypocalcemia   Macrocytosis   Dependent edema   Palliative care encounter   Acute on chronic diastolic heart failure exacerbation Patient presenting with dyspnea on exertion and lower extremity edema and shortness of breath.  CT of the chest was negative for PE and pneumonia.  Mucous plugs in the lower lobe bronchi were noted.  Duplex ultrasound of the lower extremities without any DVT.  On Lasix  40 mg IV twice daily, low-salt diet, CHF protocol.  Patient is negative balance for 140 mL.  Will continue to monitor closely.  Will put the patient on strict intake and output charting Daily weights.  On nasal cannula oxygen.  Will continue to wean off oxygen as able.  Currently on 1 L of oxygen by nasal cannula and complains of cough and congestion.  Asthma exacerbation secondary to diastolic heart failure. Continue inhalers and prednisone .  Respiratory viral panel was negative.  Continue incentive spirometry Mucinex .   Essential HTN, CAD Continue lisinopril  Toprol  and Plavix    Hypokalemia,  Improved after replacement.  Latest potassium of 4.2.  Continue to monitor.   Macrocytosis likely  secondary to vitamin B-12 deficiency. Vitamin B12 level 291, goal >400, started oral supplement. Follow-up PCP to repeat vitamin B12 level after 3 to 6 months.   Parkinson disease, continue Sinemet  by PT OT and recommend skilled nursing facility placement.   Aortic atherosclerosis  CTA: Dilated ascending thoracic aorta to 4 cm. Recommend annual Imaging.  nIncidental finding asymptomatic for outpatient follow-up    Sarcoidosis No new lymphadenopathy on her CT pulmonary angiogram.  No new acute flare.    Grade 1 obesity.Body mass index is 32.17 kg/m.  Would benefit from ongoing weight loss as outpatient.  Debility, deconditioning, Parkinson's disease, advanced age, palliative care has been consulted.  Patient is DNR/DNI.  Plan is community palliative care on discharge.  Physical therapy has seen the patient and recommended skilled nursing facility placement.   DVT prophylaxis: enoxaparin  (LOVENOX ) injection 40 mg Start: 07/17/24 2200 SCDs Start: 07/17/24 1932   Disposition: Skilled nursing facility as per PT OT  Status is: Inpatient Remains inpatient appropriate because: Pending clinical improvement, IV diuretics, compensated heart failure, need for rehabilitation    Code Status:     Code Status: Limited: Do not attempt resuscitation (DNR) -DNR-LIMITED -Do Not Intubate/DNI   Family Communication: None at bedside.  I tried to reach out to the patient's niece Rock on the phone but was unable to reach her. Unable to reach sister as well.  Consultants: Palliative care  Procedures: None  Anti-infectives:  None  Anti-infectives (From admission, onward)    None      Subjective: Today, patient was seen and examined at bedside.  Patient states that she  has some congestion and feels like phlegm in her throat during suction.  Still requiring oxygen.  Feels weak and deconditioned.  Objective: Vitals:   07/19/24 0345 07/19/24 0745  BP: (!) 153/60 (!) 159/70  Pulse: 64 64   Resp: 18 14  Temp: 97.7 F (36.5 C) (!) 97.5 F (36.4 C)  SpO2: 100% 100%    Intake/Output Summary (Last 24 hours) at 07/19/2024 1059 Last data filed at 07/19/2024 0900 Gross per 24 hour  Intake 600 ml  Output 800 ml  Net -200 ml   Filed Weights   07/17/24 1543  Weight: 85 kg   Body mass index is 32.17 kg/m.   Physical Exam:  GENERAL: Patient is alert awake and Communicative, oriented, elderly female, obese built, not in obvious distress.  On nasal oxygen HENT: No scleral pallor or icterus. Pupils equally reactive to light. Oral mucosa is moist NECK: is supple, no gross swelling noted. CHEST: Decreased breath sounds bilaterally.  Coarse breath sounds noted. CVS: S1 and S2 heard, no murmur. Regular rate and rhythm.  ABDOMEN: Soft, non-tender, bowel sounds are present. EXTREMITIES: Bilateral lower extremity pitting edema noted CNS: Cranial nerves are intact.  Generalized weakness noted.  Moves extremities. SKIN: warm and dry without rashes.  Data Review: I have personally reviewed the following laboratory data and studies,  CBC: Recent Labs  Lab 07/17/24 1545 07/18/24 0425 07/19/24 0428  WBC 8.3 8.1 10.3  HGB 12.9 12.8 13.2  HCT 38.5 37.7 39.6  MCV 100.3* 98.4 100.3*  PLT 189 180 194   Basic Metabolic Panel: Recent Labs  Lab 07/17/24 1618 07/18/24 0425 07/18/24 0824 07/19/24 0428  NA 141 142  --  140  K 4.3 3.4*  --  4.2  CL 106 104  --  99  CO2 27 28  --  29  GLUCOSE 158* 120*  --  128*  BUN 20 17  --  32*  CREATININE 1.00 0.93  --  1.23*  CALCIUM 8.7* 8.5*  --  8.8*  MG  --   --  1.9 2.1  PHOS  --   --  4.0 3.9   Liver Function Tests: Recent Labs  Lab 07/18/24 0425  AST 12*  ALT <5  ALKPHOS 92  BILITOT 0.9  PROT 6.7  ALBUMIN 3.6   No results for input(s): LIPASE, AMYLASE in the last 168 hours. No results for input(s): AMMONIA in the last 168 hours. Cardiac Enzymes: No results for input(s): CKTOTAL, CKMB, CKMBINDEX, TROPONINI  in the last 168 hours. BNP (last 3 results) Recent Labs    02/13/24 0329 03/06/24 0813 07/17/24 1618  BNP 239.9* 358.1* 384.4*    ProBNP (last 3 results) No results for input(s): PROBNP in the last 8760 hours.  CBG: No results for input(s): GLUCAP in the last 168 hours. Recent Results (from the past 240 hours)  Respiratory (~20 pathogens) panel by PCR     Status: None   Collection Time: 07/18/24  3:05 PM   Specimen: Nasopharyngeal Swab; Respiratory  Result Value Ref Range Status   Adenovirus NOT DETECTED NOT DETECTED Final   Coronavirus 229E NOT DETECTED NOT DETECTED Final    Comment: (NOTE) The Coronavirus on the Respiratory Panel, DOES NOT test for the novel  Coronavirus (2019 nCoV)    Coronavirus HKU1 NOT DETECTED NOT DETECTED Final   Coronavirus NL63 NOT DETECTED NOT DETECTED Final   Coronavirus OC43 NOT DETECTED NOT DETECTED Final   Metapneumovirus NOT DETECTED NOT DETECTED Final   Rhinovirus /  Enterovirus NOT DETECTED NOT DETECTED Final   Influenza A NOT DETECTED NOT DETECTED Final   Influenza B NOT DETECTED NOT DETECTED Final   Parainfluenza Virus 1 NOT DETECTED NOT DETECTED Final   Parainfluenza Virus 2 NOT DETECTED NOT DETECTED Final   Parainfluenza Virus 3 NOT DETECTED NOT DETECTED Final   Parainfluenza Virus 4 NOT DETECTED NOT DETECTED Final   Respiratory Syncytial Virus NOT DETECTED NOT DETECTED Final   Bordetella pertussis NOT DETECTED NOT DETECTED Final   Bordetella Parapertussis NOT DETECTED NOT DETECTED Final   Chlamydophila pneumoniae NOT DETECTED NOT DETECTED Final   Mycoplasma pneumoniae NOT DETECTED NOT DETECTED Final    Comment: Performed at [facility], [address] [place], [place] [address]     Studies: US  Venous Img Lower Bilateral (DVT) Result Date: 07/17/2024 CLINICAL DATA:  Lower extremity swelling. EXAM: BILATERAL LOWER EXTREMITY VENOUS DOPPLER ULTRASOUND TECHNIQUE: Gray-scale sonography with compression, as well as color and  duplex ultrasound, were performed to evaluate the deep venous system(s) from the level of the common femoral vein through the popliteal and proximal calf veins. COMPARISON:  None Available. FINDINGS: VENOUS Normal compressibility of the common femoral, superficial femoral, and popliteal veins, as well as the visualized calf veins. Visualized portions of profunda femoral vein and great saphenous vein unremarkable. No filling defects to suggest DVT on grayscale or color Doppler imaging. Doppler waveforms show normal direction of venous flow, normal respiratory plasticity and response to augmentation. Limited views of the contralateral common femoral vein are unremarkable. OTHER None. Limitations: Somewhat limited visualization of the calf veins. IMPRESSION: No evidence of a deep venous thrombosis of either lower extremity. Electronically Signed   By: Alm Parkins M.D.   On: 07/17/2024 20:47   CT Angio Chest PE W and/or Wo Contrast Result Date: 07/17/2024 CLINICAL DATA:  History of sarcoidosis. Short of breath with chest congestion and pain. EXAM: CT ANGIOGRAPHY CHEST WITH CONTRAST TECHNIQUE: Multidetector CT imaging of the chest was performed using the standard protocol during bolus administration of intravenous contrast. Multiplanar CT image reconstructions and MIPs were obtained to evaluate the vascular anatomy. RADIATION DOSE REDUCTION: This exam was performed according to the departmental dose-optimization program which includes automated exposure control, adjustment of the mA and/or kV according to patient size and/or use of iterative reconstruction technique. CONTRAST:  75mL OMNIPAQUE  IOHEXOL  350 MG/ML SOLN COMPARISON:  01/06/2023. FINDINGS: Cardiovascular: Pulmonary arteries to the proximal segmental level are well opacified. There is no evidence of a pulmonary embolism. Heart is normal in size. No pericardial effusion. Aorta is minimally opacified. Ascending aorta measures 4 cm in diameter. No convincing  dissection. Stable atherosclerosis. Mediastinum/Nodes: No neck base, mediastinal or hilar masses. No enlarged lymph nodes. Trachea and esophagus are unremarkable. Moderate hiatal hernia. Lungs/Pleura: Secretions/mucus plugging noted in lower lobe bronchi, left greater than right. Trachea and bronchi are partly flattened consistent with the exam being expiratory in phase. There are linear and patchy opacities in the left lower lobe consistent with atelectasis, associated with volume loss. Remainder of the lungs is essentially clear. Trace left pleural effusion. No right pleural effusion. No pneumothorax. Upper Abdomen: No acute findings.  Aortic atherosclerosis. Musculoskeletal: No fracture or acute finding. No bone lesion. Skeletal structures are demineralized. No chest wall mass. Review of the MIP images confirms the above findings. IMPRESSION: 1. No evidence of a pulmonary embolism. 2. Excretion/mucous plugging partly occluding lower lobe bronchi, greater on the left, on the left associated with atelectasis. No convincing pneumonia. No pulmonary edema.  3. Dilated ascending thoracic aorta to 4 cm. Recommend annual imaging followup by CTA or MRA. This recommendation follows 2010 ACCF/AHA/AATS/ACR/ASA/SCA/SCAI/SIR/STS/SVM Guidelines for the Diagnosis and Management of Patients with Thoracic Aortic Disease. Circulation. 2010; 121: Z733-z630. Aortic aneurysm NOS (ICD10-I71.9) 4. Aortic atherosclerosis. Aortic Atherosclerosis (ICD10-I70.0). Electronically Signed   By: Alm Parkins M.D.   On: 07/17/2024 18:29   DG Chest Portable 1 View Result Date: 07/17/2024 CLINICAL DATA:  [name].  Cough. EXAM: PORTABLE CHEST 1 VIEW COMPARISON:  07/11/2024.  CT 01/06/2023 FINDINGS: Cardiomegaly is stable. Retrocardiac hiatal hernia. Dense aortic atherosclerosis. Minor atelectasis at the left lung base. No confluent airspace disease. No pulmonary edema, pleural effusion or pneumothorax. IMPRESSION: 1. Stable  cardiomegaly. No acute findings. 2. Hiatal hernia. Electronically Signed   By: Andrea Gasman M.D.   On: 07/17/2024 16:33      Vernal Alstrom, MD  [facility] 07/19/2024  If 7PM-7AM, please contact night-coverage

## 2024-07-19 NOTE — Progress Notes (Signed)
 Occupational Therapy Treatment Patient Details Name: Sheila Long MRN: [id number] DOB: June 18, 1924 Today's Date: 07/19/2024   History of present illness Pt is a 89 y/o female admitted secondary to SOB and found to have acute on chronic CHF. PMH including but not limited to COPD, Parkinson's disease chronic diastolic heart failure, coronary artery disease, gastroesophageal reflux disease, sarcoidosis and ambulatory dysfunction.   OT comments  Pt seen for OT tx. Pt received in the recliner and agreeable to session. RN requested OT to evaluate safety with medication administration. Pills prepared ahead of time. Pt had saved some juice to take with her pills. Pt endorses recent difficulty with meds 2/2 mucus resulting in pills getting caught in her throat. Pt trialed juice and applesauce, noting the applesauce worked much better. Intermittent VC required for safety (eg., pt attempted to talk several times while pills were still in her mouth, took 2 pills at once.) Pt educated in safety and need for supv/assist for medication at home. Pt verbalizes understanding, endorses this is available to her. She also notes assist for meals, cleaning, bathing x2/wk. Pt required supv for transfers from recliner and BSC over toilet. Pt requested MAX A for pericare after BM, citing she typically has assist for pericare after BMs at home 2/2 difficulty reaching and concern over thoroughness. Pt using 2L O2 throughout session. Denied SOB. Progressing towards goals and continues to benefit. One of her caregivers present at end of the session and educated in safety with medications and verbalizes ability to provide assist with medications upon discharge.       If plan is discharge home, recommend the following:  A little help with walking and/or transfers;A little help with bathing/dressing/bathroom;Help with stairs or ramp for entrance   Equipment Recommendations  None recommended by OT (has needed DME)     Recommendations for Other Services      Precautions / Restrictions Precautions Precautions: Fall Recall of Precautions/Restrictions: Impaired Restrictions Weight Bearing Restrictions Per Provider Order: No       Mobility Bed Mobility               General bed mobility comments: NT, in recliner    Transfers Overall transfer level: Needs assistance Equipment used: Rolling walker (2 wheels) Transfers: Sit to/from Stand Sit to Stand: Supervision                 Balance Overall balance assessment: Needs assistance Sitting-balance support: Feet supported Sitting balance-Leahy Scale: Good     Standing balance support: During functional activity, Reliant on assistive device for balance, Bilateral upper extremity supported Standing balance-Leahy Scale: Fair                             ADL either performed or assessed with clinical judgement   ADL Overall ADL's : Needs assistance/impaired     Grooming: Sitting;Set up                   Toilet Transfer: Contact guard assist;BSC/3in1;Rolling walker (2 wheels);Regular Teacher, adult education Details (indicate cue type and reason): BSC over toilet, VC for positioning prior to sitting Toileting- Clothing Manipulation and Hygiene: Sit to/from stand;Sitting/lateral lean;Maximal assistance Toileting - Clothing Manipulation Details (indicate cue type and reason): Pt declines to attempt pericare 2/2 history of poor thoroughness with BMs. MAX A provided. Pt reports at baseline she only has a BM if someone is there to assist wtih pericare.     Functional mobility during  ADLs: Contact guard assist;Rolling walker (2 wheels)      Extremity/Trunk Assessment              Vision       Perception     Praxis     Communication Communication Communication: Impaired Factors Affecting Communication: Hearing impaired   Cognition Arousal: Alert Behavior During Therapy: WFL for tasks  assessed/performed Cognition: No family/caregiver present to determine baseline             OT - Cognition Comments: Pt demonstrating very mild safety awareness deficits                   Following commands impaired: Only follows one step commands consistently      Cueing   Cueing Techniques: Verbal cues, Gestural cues  Exercises Other Exercises Other Exercises: Pt educated in medication safety while assessing pt's safety with medications (RN requesting OT assess)    Shoulder Instructions       General Comments      Pertinent Vitals/ Pain       Pain Assessment Pain Assessment: No/denies pain  Home Living                                          Prior Functioning/Environment              Frequency  Min 2X/week        Progress Toward Goals  OT Goals(current goals can now be found in the care plan section)  Progress towards OT goals: Progressing toward goals  Acute Rehab OT Goals Patient Stated Goal: go home OT Goal Formulation: With patient Time For Goal Achievement: 08/01/24 Potential to Achieve Goals: Good  Plan      Co-evaluation                 AM-PAC OT 6 Clicks Daily Activity     Outcome Measure   Help from another person eating meals?: None Help from another person taking care of personal grooming?: None Help from another person toileting, which includes using toliet, bedpan, or urinal?: A Little Help from another person bathing (including washing, rinsing, drying)?: A Little Help from another person to put on and taking off regular upper body clothing?: None Help from another person to put on and taking off regular lower body clothing?: A Little 6 Click Score: 21    End of Session Equipment Utilized During Treatment: Rolling walker (2 wheels);Oxygen  OT Visit Diagnosis: Other abnormalities of gait and mobility (R26.89)   Activity Tolerance Patient tolerated treatment well   Patient Left in  chair;with call bell/phone within reach;with chair alarm set   Nurse Communication Mobility status;Other (comment) (pills)        Time: 8978-8886 OT Time Calculation (min): 52 min  Charges: OT General Charges $OT Visit: 1 Visit OT Treatments $Self Care/Home Management : 38-52 mins  Warren SAUNDERS., MPH, MS, OTR/L ascom [phone] 07/19/24, 1:16 PM

## 2024-07-19 NOTE — Progress Notes (Addendum)
 Physical Therapy Treatment Patient Details Name: Sheila Long MRN: [id number] DOB: [birth year] Today's Date: 07/19/2024   History of Present Illness Pt is a 89 y/o female admitted secondary to SOB and found to have acute on chronic CHF. PMH including but not limited to COPD, Parkinson's disease chronic diastolic heart failure, coronary artery disease, gastroesophageal reflux disease, sarcoidosis and ambulatory dysfunction.    PT Comments  Patient alert, agreeable to PT, oriented to self, place, situation, denied pain. With extra time, able to get to EOB with CGA, use of bed rails. Sit <> stand with supervision, prior to PT placement of RW. Able to ambulate ~61ft with chair follow, CGA-supervision with RW. Improved cadence and gait velocity over distance. Pt endorsed bilateral knee stiffness but no LOB noted. Pt up in recliner with needs in reach. The patient would benefit from further skilled PT intervention to continue to progress towards goals.     If plan is discharge home, recommend the following: Assistance with cooking/housework;Assist for transportation;A little help with walking and/or transfers;A little help with bathing/dressing/bathroom   Can travel by private vehicle     No  Equipment Recommendations  BSC/3in1 (TBD)    Recommendations for Other Services       Precautions / Restrictions Precautions Precautions: Fall Recall of Precautions/Restrictions: Impaired Restrictions Weight Bearing Restrictions Per Provider Order: No     Mobility  Bed Mobility Overal bed mobility: Needs Assistance Bed Mobility: Supine to Sit Rolling: Contact guard assist, Used rails         General bed mobility comments: extra time and effort but able to do without physical assistance. stated she has a bedrail at home    Transfers Overall transfer level: Needs assistance Equipment used: Rolling walker (2 wheels) Transfers: Sit to/from Stand Sit to Stand: Supervision                 Ambulation/Gait Ambulation/Gait assistance: Supervision, Contact guard assist Gait Distance (Feet): 65 Feet Assistive device: Rolling walker (2 wheels)         General Gait Details: close chair follow. pt with improved cadence and gait speed over distance, referenced her knees were stiff   Stairs             Wheelchair Mobility     Tilt Bed    Modified Rankin (Stroke Patients Only)       Balance Overall balance assessment: Needs assistance Sitting-balance support: Feet supported Sitting balance-Leahy Scale: Good     Standing balance support: During functional activity, Single extremity supported, Reliant on assistive device for balance Standing balance-Leahy Scale: Fair                              Musician Factors Affecting Communication: Hearing impaired  Cognition Arousal: Alert Behavior During Therapy: WFL for tasks assessed/performed                           PT - Cognition Comments: pt oriented to self, month/year, place, situation Following commands: Intact      Cueing Cueing Techniques: Verbal cues, Gestural cues  Exercises      General Comments        Pertinent Vitals/Pain Pain Assessment Pain Assessment: No/denies pain    Home Living                          Prior Function  PT Goals (current goals can now be found in the care plan section) Progress towards PT goals: Progressing toward goals    Frequency    Min 3X/week      PT Plan      Co-evaluation              AM-PAC PT 6 Clicks Mobility   Outcome Measure  Help needed turning from your back to your side while in a flat bed without using bedrails?: A Little Help needed moving from lying on your back to sitting on the side of a flat bed without using bedrails?: A Little Help needed moving to and from a bed to a chair (including a wheelchair)?: A Lot Help needed standing up from a chair  using your arms (e.g., wheelchair or bedside chair)?: A Lot Help needed to walk in hospital room?: A Lot Help needed climbing 3-5 steps with a railing? : Total 6 Click Score: 13    End of Session   Activity Tolerance: Patient tolerated treatment well Patient left: with call bell/phone within reach;in chair;with chair alarm set Nurse Communication: Mobility status PT Visit Diagnosis: Other abnormalities of gait and mobility (R26.89)     Time: 9085-9067 PT Time Calculation (min) (ACUTE ONLY): 18 min  Charges:    $Therapeutic Activity: 8-22 mins PT General Charges $$ ACUTE PT VISIT: 1 Visit                    Doyal Shams PT, DPT 12:03 PM,07/19/24

## 2024-07-19 NOTE — Plan of Care (Signed)

## 2024-07-20 DIAGNOSIS — I5033 Acute on chronic diastolic (congestive) heart failure: Secondary | ICD-10-CM | POA: Diagnosis not present

## 2024-07-20 LAB — BASIC METABOLIC PANEL WITH GFR
Anion gap: 14 (ref 5–15)
BUN: 44 mg/dL — ABNORMAL HIGH (ref 8–23)
CO2: 29 mmol/L (ref 22–32)
Calcium: 8.9 mg/dL (ref 8.9–10.3)
Chloride: 98 mmol/L (ref 98–111)
Creatinine, Ser: 1.38 mg/dL — ABNORMAL HIGH (ref 0.44–1.00)
GFR, Estimated: 35 mL/min — ABNORMAL LOW (ref >60)
Glucose, Bld: 122 mg/dL — ABNORMAL HIGH (ref 70–99)
Potassium: 4.4 mmol/L (ref 3.5–5.1)
Sodium: 141 mmol/L (ref 135–145)

## 2024-07-20 LAB — CBC
HCT: 38.3 % (ref 36.0–46.0)
Hemoglobin: 12.6 g/dL (ref 12.0–15.0)
MCH: 33.5 pg (ref 26.0–34.0)
MCHC: 32.9 g/dL (ref 30.0–36.0)
MCV: 101.9 fL — ABNORMAL HIGH (ref 80.0–100.0)
Platelets: 202 K/uL (ref 150–400)
RBC: 3.76 MIL/uL — ABNORMAL LOW (ref 3.87–5.11)
RDW: 14.7 % (ref 11.5–15.5)
WBC: 10.2 K/uL (ref 4.0–10.5)
nRBC: 0 % (ref 0.0–0.2)

## 2024-07-20 LAB — MAGNESIUM: Magnesium: 2 mg/dL (ref 1.7–2.4)

## 2024-07-20 MED ORDER — FOLIC ACID 1 MG PO TABS: 1.0000 mg | ORAL_TABLET | Freq: Every day | ORAL | 0 refills | Status: AC

## 2024-07-20 MED ORDER — GUAIFENESIN-DM 100-10 MG/5ML PO SYRP: 5.0000 mL | ORAL_SOLUTION | Freq: Four times a day (QID) | ORAL | 0 refills | Status: AC | PRN

## 2024-07-20 MED ORDER — PREDNISONE 20 MG PO TABS: 40.0000 mg | ORAL_TABLET | Freq: Every day | ORAL | 0 refills | Status: DC

## 2024-07-20 MED ORDER — FUROSEMIDE 20 MG PO TABS: 40.0000 mg | ORAL_TABLET | Freq: Every day | ORAL | 2 refills | Status: AC

## 2024-07-20 MED ORDER — CYANOCOBALAMIN 1000 MCG PO TABS: 1000.0000 ug | ORAL_TABLET | Freq: Every day | ORAL | 0 refills | Status: AC

## 2024-07-20 MED ORDER — FUROSEMIDE 20 MG PO TABS
40.0000 mg | ORAL_TABLET | Freq: Every day | ORAL | 2 refills | Status: DC
Start: 2024-07-20 — End: 2024-07-20

## 2024-07-20 MED ORDER — ADULT MULTIVITAMIN W/MINERALS CH: 1.0000 | ORAL_TABLET | Freq: Every day | ORAL | Status: AC

## 2024-07-20 NOTE — Progress Notes (Signed)
 Physical Therapy Treatment Patient Details Name: Sheila Long MRN: [id number] DOB: 05-27-1924 Today's Date: 07/20/2024   History of Present Illness Pt is a 89 y/o female admitted secondary to SOB and found to have acute on chronic CHF. PMH including but not limited to COPD, Parkinson's disease chronic diastolic heart failure, coronary artery disease, gastroesophageal reflux disease, sarcoidosis and ambulatory dysfunction.    PT Comments  Pt ready for session.  Stated she had been up prior but recently back to bed.  She is able to get to EOB with inc time and bed features with verbal cues.  Steady in sitting.  Stands and is able to walk 100' x 1 and 60' x 1 with RW and increased speed today vs notes yesterday.  No LOB or buckling but would benefit from +1 at home for general safety and cues.  Very small BM in BSC before returning to bed with mod a x 1 for LE's.  Overall improved today.   If plan is discharge home, recommend the following: Assistance with cooking/housework;Assist for transportation;A little help with walking and/or transfers;A little help with bathing/dressing/bathroom   Can travel by private vehicle        Equipment Recommendations  BSC/3in1    Recommendations for Other Services       Precautions / Restrictions Precautions Precautions: Fall Recall of Precautions/Restrictions: Impaired Restrictions Weight Bearing Restrictions Per Provider Order: No     Mobility  Bed Mobility Overal bed mobility: Needs Assistance Bed Mobility: Supine to Sit     Supine to sit: HOB elevated, Used rails Sit to supine: Mod assist   General bed mobility comments: assist for LE's into bed Patient Response: Cooperative  Transfers Overall transfer level: Needs assistance Equipment used: Rolling walker (2 wheels) Transfers: Sit to/from Stand Sit to Stand: Supervision, Contact guard assist                Ambulation/Gait Ambulation/Gait assistance: Supervision,  Contact guard assist Gait Distance (Feet): 100 Feet Assistive device: Rolling walker (2 wheels) Gait Pattern/deviations: Step-through pattern, Trunk flexed Gait velocity: improved today but still a bit slow     General Gait Details: 100', 41' with RW and heavy lean on walker.  Cues to correct as she drifts R frequently   Stairs             Wheelchair Mobility     Tilt Bed Tilt Bed Patient Response: Cooperative  Modified Rankin (Stroke Patients Only)       Balance Overall balance assessment: Needs assistance Sitting-balance support: Feet supported Sitting balance-Leahy Scale: Good     Standing balance support: During functional activity, Reliant on assistive device for balance, Bilateral upper extremity supported Standing balance-Leahy Scale: Fair Standing balance comment: RW use, SBA for ambulation, no LOB throughout                            Communication Communication Communication: Impaired Factors Affecting Communication: Hearing impaired  Cognition Arousal: Alert Behavior During Therapy: WFL for tasks assessed/performed   PT - Cognitive impairments: Difficult to assess Difficult to assess due to: Hard of hearing/deaf                       Following commands: Intact Following commands impaired: Only follows one step commands consistently, Follows one step commands with increased time    Cueing Cueing Techniques: Verbal cues, Gestural cues  Exercises      General Comments  Pertinent Vitals/Pain Pain Assessment Pain Assessment: No/denies pain    Home Living                          Prior Function            PT Goals (current goals can now be found in the care plan section) Progress towards PT goals: Progressing toward goals    Frequency    Min 3X/week      PT Plan      Co-evaluation              AM-PAC PT 6 Clicks Mobility   Outcome Measure  Help needed turning from your back to  your side while in a flat bed without using bedrails?: A Little Help needed moving from lying on your back to sitting on the side of a flat bed without using bedrails?: A Little Help needed moving to and from a bed to a chair (including a wheelchair)?: A Little Help needed standing up from a chair using your arms (e.g., wheelchair or bedside chair)?: A Little Help needed to walk in hospital room?: A Little Help needed climbing 3-5 steps with a railing? : A Lot 6 Click Score: 17    End of Session Equipment Utilized During Treatment: Gait belt;Oxygen Activity Tolerance: Patient tolerated treatment well Patient left: in bed;with bed alarm set;with call bell/phone within reach Nurse Communication: Mobility status PT Visit Diagnosis: Other abnormalities of gait and mobility (R26.89)     Time: 8897-8872 PT Time Calculation (min) (ACUTE ONLY): 25 min  Charges:    $Gait Training: 23-37 mins PT General Charges $$ ACUTE PT VISIT: 1 Visit                   Lauraine Gills, PTA 07/20/24, 11:40 AM

## 2024-07-20 NOTE — Plan of Care (Signed)

## 2024-07-20 NOTE — TOC Transition Note (Signed)
 Transition of Care [facility]) - Discharge Note   Patient Details  Name: Sheila Long MRN: [id number] Date of Birth: 04-16-1924  Transition of [facility] CM/SW Contact:  Maalik Pinn C Zyrion Coey, RN Phone Number: 07/20/2024, 1:03 PM   Clinical Narrative:    Spoke with patient at bedside regarding discharge plan. Patient stated she has a friend that assist her at home Brudell. Patient stated she was already working with [facility] before admission. She was advised HH would contact her to schedule the appointment for [facility].   Spoke with [employer] from [place]. Darleene stated patient is not currently active. He stated patient has declined services the last three times they have attempted. Darleene is agreeable to referral again.   Attempt to reach patient's sister Algene. No answer. Left a message.   Contacted Brudell, patient's neighbor. She will come and transport patient home.  Retrieved call from patient's sister, Algene. She was advised patient was arranged with HH. RNCM advised patient was previously arranged with [facility] but had refused.  Algene stated to call Brudell  RNCM contat Brudell. She stated patient was active with [facility] prior to admission. Brudell request for [facility] to contact her to schedule resumption of care appointment  Attempt to reach Shaun from [facility]. No answer. Left a message regarding patient dishcarge   Darleene from [place] contacted and updated that patient is active with [facility].  TOC signing off.            Patient Goals and CMS Choice            Discharge Placement                       Discharge Plan and Services Additional resources added to the After Visit Summary for                                       Social Drivers of Health (SDOH) Interventions SDOH Screenings   Food Insecurity: No Food Insecurity (07/18/2024)  Housing: Low Risk  (07/18/2024)  Transportation Needs: No Transportation Needs (07/18/2024)  Utilities: Not At  Risk (07/18/2024)  Alcohol Screen: Low Risk  (10/11/2021)  Depression (PHQ2-9): Low Risk  (10/11/2021)  Financial Resource Strain: Low Risk  (07/06/2024)   Received from [facility]  Physical Activity: Inactive (10/11/2021)  Social Connections: Moderately Integrated (07/18/2024)  Stress: No Stress Concern Present (10/11/2021)  Tobacco Use: Low Risk  (07/17/2024)     Readmission Risk Interventions     No data to display

## 2024-07-20 NOTE — Discharge Summary (Signed)
 Physician Discharge Summary  Sheila Long FMW:[id number] DOB: 12/16/1924 DOA: 07/17/2024  PCP: Sheila Bail, MD  Admit date: 07/17/2024 Discharge date: 07/20/2024  Admitted From: Home  Discharge disposition: Home with home health   Recommendations for Outpatient Follow-Up:   Follow up with your primary care provider in one week.  Check CBC, BMP, magnesium  in the next visit Please check Vitamin D  and B12 level in 3 to 6 months. Recommend annual follow-up for aortic atherosclerotic changes with dilatation. Patient would benefit from community palliative care follow-up as outpatient.   Discharge Diagnosis:   Principal Problem:   Acute on chronic diastolic (congestive) heart failure (HCC) Active Problems:   Dyspnea   HTN (hypertension)   Atelectasis   CAD (coronary artery disease)   OSA (obstructive sleep apnea)   Parkinson disease (HCC)   Aortic atherosclerosis (HCC)   URTI (acute upper respiratory infection)   Sarcoidosis   Hypocalcemia   Macrocytosis   Dependent edema   Palliative care encounter   Discharge Condition: Improved.  Diet recommendation: Low sodium, heart healthy.  1500 mL fluid restriction.  Wound care: None.  Code status: DNR   History of Present Illness:   Patient is a 89 year old female with PMH of Parkinson's disease, chronic dHF, CAD, GERD, sarcoidosis and ambulatory dysfunction presented to hospital with dyspnea orthopnea and increasing lower extremity edema for 2 weeks prior to presentation.  Patient was then admitted hospital for further evaluation and treatment.       Hospital Course:   Following conditions were addressed during hospitalization as listed below,  Acute on chronic diastolic heart failure exacerbation Patient presenting with dyspnea on exertion and lower extremity edema and shortness of breath.  CT of the chest was negative for PE and pneumonia.  Mucous plugs in the lower lobe bronchi were noted.  Duplex  ultrasound of the lower extremities without any DVT.  During hospitalization patient received IV Lasix  low-salt diet and CHF protocol with improvement in her symptoms..  Patient will be revised to 1500 mL fluid restriction with low-salt diet and 40 mg of Lasix  daily on discharge.  Asthma exacerbation  Continue inhalers and prednisone .  Continue prednisone  for next 3 days.  Respiratory viral panel was negative.  Continue incentive spirometry Mucinex  on discharge.   Essential HTN, CAD Continue lisinopril  Toprol  and Plavix .  Blood pressure remained stable.   Hypokalemia,  Improved after replacement.  Latest potassium of 4.4.  Monitor as outpatient.   Macrocytosis likely secondary to vitamin B-12 deficiency. Vitamin B12 level 291, goal >400, started oral supplement. Follow-up PCP to repeat vitamin B12 level after 3 to 6 months.   Parkinson disease, continue Sinemet . PT OT initially recommend skilled nursing facility placement but at this time recommendation is home health with family support..   Aortic atherosclerosis on CTA.  Dilated ascending thoracic aorta to 4 cm. Recommend annual Imaging. Incidental finding asymptomatic for outpatient follow-up    Sarcoidosis No new lymphadenopathy on her CT pulmonary angiogram.  No new acute flare.     Grade 1 obesity.Body mass index is 32.17 kg/m.  Would benefit from ongoing weight loss as outpatient.   Debility, deconditioning, Parkinson's disease, advanced age, palliative care was consulted, patient is DNR/DNI.  Plan is community palliative care on discharge.  Current plan is home with home health on discharge.  Disposition.  At this time, patient is stable for disposition home with home health.  Medical Consultants:   Palliative care  Procedures:    None Subjective:   Today,  patient was seen and examined at bedside.  Complains of mild cough but overall feels better.  Wants to go home.  Denies any pain nausea vomiting fever or  chills.  Discharge Exam:   Vitals:   07/20/24 1421 07/20/24 1430  BP:    Pulse:  60  Resp:    Temp:    SpO2: 95% 100%   Vitals:   07/20/24 0827 07/20/24 1155 07/20/24 1421 07/20/24 1430  BP: (!) 148/64 (!) 123/52    Pulse: (!) 58   60  Resp:      Temp: 97.7 F (36.5 C) 97.8 F (36.6 C)    TempSrc:  Oral    SpO2: 100% 100% 95% 100%  Weight:      Height:       Body mass index is 31.56 kg/m.  General: Alert awake, not in obvious distress, elderly female, obese built HENT: pupils equally reacting to light,  No scleral pallor or icterus noted. Oral mucosa is moist.  Chest:   Diminished breath sounds bilaterally.  Coarse breath sounds noted CVS: S1 &S2 heard. No murmur.  Regular rate and rhythm. Abdomen: Soft, nontender, nondistended.  Bowel sounds are heard.   Extremities: No cyanosis, clubbing bilateral lower extremity edema..  Peripheral pulses are palpable. Psych: Alert, awake and Communicative. CNS:  No cranial nerve deficits.  No visible extremities, generalized weakness noted Skin: Warm and dry.  No rashes noted.  The results of significant diagnostics from this hospitalization (including imaging, microbiology, ancillary and laboratory) are listed below for reference.     Diagnostic Studies:   US  Venous Img Lower Bilateral (DVT) Result Date: 07/17/2024 CLINICAL DATA:  Lower extremity swelling. EXAM: BILATERAL LOWER EXTREMITY VENOUS DOPPLER ULTRASOUND TECHNIQUE: Gray-scale sonography with compression, as well as color and duplex ultrasound, were performed to evaluate the deep venous system(s) from the level of the common femoral vein through the popliteal and proximal calf veins. COMPARISON:  None Available. FINDINGS: VENOUS Normal compressibility of the common femoral, superficial femoral, and popliteal veins, as well as the visualized calf veins. Visualized portions of profunda femoral vein and great saphenous vein unremarkable. No filling defects to suggest DVT on  grayscale or color Doppler imaging. Doppler waveforms show normal direction of venous flow, normal respiratory plasticity and response to augmentation. Limited views of the contralateral common femoral vein are unremarkable. OTHER None. Limitations: Somewhat limited visualization of the calf veins. IMPRESSION: No evidence of a deep venous thrombosis of either lower extremity. Electronically Signed   By: Alm Parkins M.D.   On: 07/17/2024 20:47   CT Angio Chest PE W and/or Wo Contrast Result Date: 07/17/2024 CLINICAL DATA:  History of sarcoidosis. Short of breath with chest congestion and pain. EXAM: CT ANGIOGRAPHY CHEST WITH CONTRAST TECHNIQUE: Multidetector CT imaging of the chest was performed using the standard protocol during bolus administration of intravenous contrast. Multiplanar CT image reconstructions and MIPs were obtained to evaluate the vascular anatomy. RADIATION DOSE REDUCTION: This exam was performed according to the departmental dose-optimization program which includes automated exposure control, adjustment of the mA and/or kV according to patient size and/or use of iterative reconstruction technique. CONTRAST:  75mL OMNIPAQUE  IOHEXOL  350 MG/ML SOLN COMPARISON:  01/06/2023. FINDINGS: Cardiovascular: Pulmonary arteries to the proximal segmental level are well opacified. There is no evidence of a pulmonary embolism. Heart is normal in size. No pericardial effusion. Aorta is minimally opacified. Ascending aorta measures 4 cm in diameter. No convincing dissection. Stable atherosclerosis. Mediastinum/Nodes: No neck base, mediastinal or hilar masses.  No enlarged lymph nodes. Trachea and esophagus are unremarkable. Moderate hiatal hernia. Lungs/Pleura: Secretions/mucus plugging noted in lower lobe bronchi, left greater than right. Trachea and bronchi are partly flattened consistent with the exam being expiratory in phase. There are linear and patchy opacities in the left lower lobe consistent with  atelectasis, associated with volume loss. Remainder of the lungs is essentially clear. Trace left pleural effusion. No right pleural effusion. No pneumothorax. Upper Abdomen: No acute findings.  Aortic atherosclerosis. Musculoskeletal: No fracture or acute finding. No bone lesion. Skeletal structures are demineralized. No chest wall mass. Review of the MIP images confirms the above findings. IMPRESSION: 1. No evidence of a pulmonary embolism. 2. Excretion/mucous plugging partly occluding lower lobe bronchi, greater on the left, on the left associated with atelectasis. No convincing pneumonia. No pulmonary edema. 3. Dilated ascending thoracic aorta to 4 cm. Recommend annual imaging followup by CTA or MRA. This recommendation follows 2010 ACCF/AHA/AATS/ACR/ASA/SCA/SCAI/SIR/STS/SVM Guidelines for the Diagnosis and Management of Patients with Thoracic Aortic Disease. Circulation. 2010; 121: Z733-z630. Aortic aneurysm NOS (ICD10-I71.9) 4. Aortic atherosclerosis. Aortic Atherosclerosis (ICD10-I70.0). Electronically Signed   By: Alm Parkins M.D.   On: 07/17/2024 18:29   DG Chest Portable 1 View Result Date: 07/17/2024 CLINICAL DATA:  [name].  Cough. EXAM: PORTABLE CHEST 1 VIEW COMPARISON:  07/11/2024.  CT 01/06/2023 FINDINGS: Cardiomegaly is stable. Retrocardiac hiatal hernia. Dense aortic atherosclerosis. Minor atelectasis at the left lung base. No confluent airspace disease. No pulmonary edema, pleural effusion or pneumothorax. IMPRESSION: 1. Stable cardiomegaly. No acute findings. 2. Hiatal hernia. Electronically Signed   By: Andrea Gasman M.D.   On: 07/17/2024 16:33     Labs:   Basic Metabolic Panel: Recent Labs  Lab 07/17/24 1618 07/18/24 0425 07/18/24 0824 07/19/24 0428 07/20/24 0429  NA 141 142  --  140 141  K 4.3 3.4*  --  4.2 4.4  CL 106 104  --  99 98  CO2 27 28  --  29 29  GLUCOSE 158* 120*  --  128* 122*  BUN 20 17  --  32* 44*  CREATININE 1.00 0.93  --  1.23* 1.38*   CALCIUM 8.7* 8.5*  --  8.8* 8.9  MG  --   --  1.9 2.1 2.0  PHOS  --   --  4.0 3.9  --    GFR Estimated Creatinine Clearance: 23.8 mL/min (A) (by C-G formula based on SCr of 1.38 mg/dL (H)). Liver Function Tests: Recent Labs  Lab 07/18/24 0425  AST 12*  ALT <5  ALKPHOS 92  BILITOT 0.9  PROT 6.7  ALBUMIN 3.6   No results for input(s): LIPASE, AMYLASE in the last 168 hours. No results for input(s): AMMONIA in the last 168 hours. Coagulation profile No results for input(s): INR, PROTIME in the last 168 hours.  CBC: Recent Labs  Lab 07/17/24 1545 07/18/24 0425 07/19/24 0428 07/20/24 0429  WBC 8.3 8.1 10.3 10.2  HGB 12.9 12.8 13.2 12.6  HCT 38.5 37.7 39.6 38.3  MCV 100.3* 98.4 100.3* 101.9*  PLT 189 180 194 202   Cardiac Enzymes: No results for input(s): CKTOTAL, CKMB, CKMBINDEX, TROPONINI in the last 168 hours. BNP: Invalid input(s): POCBNP CBG: No results for input(s): GLUCAP in the last 168 hours. D-Dimer No results for input(s): DDIMER in the last 72 hours. Hgb A1c Recent Labs    07/17/24 2148  HGBA1C 5.7*   Lipid Profile No results for input(s): CHOL, HDL, LDLCALC, TRIG, CHOLHDL, LDLDIRECT in the  last 72 hours. Thyroid  function studies No results for input(s): TSH, T4TOTAL, T3FREE, THYROIDAB in the last 72 hours.  Invalid input(s): FREET3 Anemia work up [employer]    07/17/24 2148 07/18/24 0824  VITAMINB12 291  --   FOLATE  --  21.3   Microbiology Recent Results (from the past 240 hours)  Respiratory (~20 pathogens) panel by PCR     Status: None   Collection Time: 07/18/24  3:05 PM   Specimen: Nasopharyngeal Swab; Respiratory  Result Value Ref Range Status   Adenovirus NOT DETECTED NOT DETECTED Final   Coronavirus 229E NOT DETECTED NOT DETECTED Final    Comment: (NOTE) The Coronavirus on the Respiratory Panel, DOES NOT test for the novel  Coronavirus (2019 nCoV)    Coronavirus HKU1 NOT DETECTED  NOT DETECTED Final   Coronavirus NL63 NOT DETECTED NOT DETECTED Final   Coronavirus OC43 NOT DETECTED NOT DETECTED Final   Metapneumovirus NOT DETECTED NOT DETECTED Final   Rhinovirus / Enterovirus NOT DETECTED NOT DETECTED Final   Influenza A NOT DETECTED NOT DETECTED Final   Influenza B NOT DETECTED NOT DETECTED Final   Parainfluenza Virus 1 NOT DETECTED NOT DETECTED Final   Parainfluenza Virus 2 NOT DETECTED NOT DETECTED Final   Parainfluenza Virus 3 NOT DETECTED NOT DETECTED Final   Parainfluenza Virus 4 NOT DETECTED NOT DETECTED Final   Respiratory Syncytial Virus NOT DETECTED NOT DETECTED Final   Bordetella pertussis NOT DETECTED NOT DETECTED Final   Bordetella Parapertussis NOT DETECTED NOT DETECTED Final   Chlamydophila pneumoniae NOT DETECTED NOT DETECTED Final   Mycoplasma pneumoniae NOT DETECTED NOT DETECTED Final    Comment: Performed at [facility], [address], [place], [place] [address]     Discharge Instructions:   Discharge Instructions     (HEART FAILURE PATIENTS) Call MD:  Anytime you have any of the following symptoms: 1) 3 pound weight gain in 24 hours or 5 pounds in 1 week 2) shortness of breath, with or without a dry hacking cough 3) swelling in the hands, feet or stomach 4) if you have to sleep on extra pillows at night in order to breathe.   Complete by: As directed    Avoid straining   Complete by: As directed    Diet - low sodium heart healthy   Complete by: As directed    Discharge instructions   Complete by: As directed    Follow-up with your primary care provider in 1 week.  Check blood work at that time.  Seek medical attention for worsening symptoms.   Heart Failure patients record your daily weight using the same scale at the same time of day   Complete by: As directed    Increase activity slowly   Complete by: As directed    STOP any activity that causes chest pain, shortness of breath, dizziness, sweating, or exessive weakness    Complete by: As directed       Allergies as of 07/20/2024       Reactions   Tramadol  Other (See Comments)   CONFUSION   Sulfa Antibiotics    Patient does not recall   Zocor [simvastatin] Other (See Comments)   INTOLERANCE-MYALGIAS        Medication List     STOP taking these medications    docusate sodium  100 MG capsule Commonly known as: COLACE   FeroSul 325 (65 Fe) MG tablet Generic drug: ferrous sulfate    predniSONE  10 MG (21) Tbpk tablet Commonly  known as: STERAPRED UNI-PAK 21 TAB Replaced by: predniSONE  20 MG tablet   torsemide  20 MG tablet Commonly known as: DEMADEX        TAKE these medications    acetylcysteine  20 % nebulizer solution Commonly known as: MUCOMYST  Inhale 4 mLs into the lungs 3 (three) times daily Use in your nebulizer machine   carbidopa -levodopa  25-100 MG tablet Commonly known as: SINEMET  IR Take 1 tablet by mouth at bedtime for 7 days. What changed: when to take this   clopidogrel  75 MG tablet Commonly known as: PLAVIX  Take 75 mg by mouth daily.   cyanocobalamin  1000 MCG tablet Take 1 tablet (1,000 mcg total) by mouth daily. Start taking on: July 21, 2024   escitalopram  5 MG tablet Commonly known as: LEXAPRO  Take 1 tablet (5 mg total) by mouth daily for 7 days.   folic acid  1 MG tablet Commonly known as: FOLVITE  Take 1 tablet (1 mg total) by mouth daily. Start taking on: July 21, 2024   furosemide  20 MG tablet Commonly known as: LASIX  Take 2 tablets (40 mg total) by mouth daily. What changed:  how much to take additional instructions   glycopyrrolate 1 MG tablet Commonly known as: ROBINUL Take 1 mg by mouth daily as needed ((drooling)).   guaiFENesin -dextromethorphan  100-10 MG/5ML syrup Commonly known as: ROBITUSSIN DM Take 5 mLs by mouth every 6 (six) hours as needed for cough.   ipratropium 0.03 % nasal spray Commonly known as: ATROVENT Place 1-2 sprays into both nostrils 3 (three) times daily as needed for  rhinitis.   ipratropium-albuterol  0.5-2.5 (3) MG/3ML Soln Commonly known as: DUONEB Inhale 3 mLs into the lungs every 6 (six) hours as needed ((wheezing)).   latanoprost  0.005 % ophthalmic solution Commonly known as: XALATAN  Place 1 drop into both eyes at bedtime.   lisinopril  20 MG tablet Commonly known as: ZESTRIL  Take 1 tablet (20 mg total) by mouth daily for 7 days.   metoprolol  succinate 50 MG 24 hr tablet Commonly known as: TOPROL -XL Take 50 mg by mouth daily. What changed: Another medication with the same name was removed. Continue taking this medication, and follow the directions you see here.   multivitamin with minerals Tabs tablet Take 1 tablet by mouth daily. Start taking on: July 21, 2024   nystatin  powder Commonly known as: MYCOSTATIN /NYSTOP  Apply 1 Application topically 3 (three) times daily.   polyethylene glycol powder 17 GM/SCOOP powder Commonly known as: GLYCOLAX /MIRALAX  Take 17 g by mouth daily as needed for mild constipation or moderate constipation.   predniSONE  20 MG tablet Commonly known as: DELTASONE  Take 2 tablets (40 mg total) by mouth daily with breakfast. Start taking on: July 21, 2024 Replaces: predniSONE  10 MG (21) Tbpk tablet   senna 8.6 MG Tabs tablet Commonly known as: SENOKOT Take 2 tablets by mouth 2 (two) times daily. for constipation may crush,mix with water and give sublingually if needed   Trelegy Ellipta 100-62.5-25 MCG/ACT Aepb Generic drug: Fluticasone -Umeclidin-Vilant Inhale 1 puff into the lungs daily.   Vitamin D  (Ergocalciferol ) 1.25 MG (50000 UNIT) Caps capsule Commonly known as: DRISDOL  Take 50,000 Units by mouth once a week.        Follow-up Information     Sheila Bail, MD Follow up in 1 week(s).   Specialty: Internal Medicine Contact information: [address] [place] [address] [phone]                  Time coordinating discharge: 39 minutes  Signed:  Clancey Welton  Angla Delahunt  [facility] 07/20/2024, 2:41 PM

## 2024-07-23 ENCOUNTER — Ambulatory Visit: Admitting: Physician Assistant

## 2024-07-29 NOTE — Progress Notes (Signed)
 Pt was poor historian so etoh ordered

## 2024-07-29 NOTE — Progress Notes (Signed)
 History of Present Illness:   Sheila Long is a 89 y.o. female here for   Verbally consented to the use of AI for note-taking.   Chief Complaint  Patient presents with  . Hospital Follow Up    Chest pain     History of Present Illness Sheila Long is a 89 year old female who presents for hospital follow up  She was recently hospitalized for shortness of breath, which has improved but remains intermittent, particularly with changes in weather and  walking long distances. Heat and humidity seem to exacerbate her symptoms. During her hospital stay, a CT scan of the chest showed mucus plugs in the lower lobes but no DVT's or pneumonia. She experiences a sore throat from trying to cough up the thick mucus and uses a nasal spray for nostril issues. Minimal coughing has been noted in the last three days.  She is on a low sodium diet and receives meals from [employer], which she describes as having no seasoning. She is on a fluid restriction of 1.5 liters per day and takes Lasix , alternating between 20 mg and 40 mg doses, affecting her urination frequency. She has lost approximately 8 pounds since April.  She has a history of falls, with a significant fall occurring in January of the previous year. She lives alone and receives home health visits once or twice a week. She uses a walker for mobility and requires assistance with some daily activities.   She experiences anxiety and has been on escitalopram  in the past, which she stopped taking. Anxiety has been worse over the last few weeks.    US  Venous Img Lower Bilateral (DVT) Result Date: 07/17/2024 CLINICAL DATA: Lower extremity swelling. EXAM: BILATERAL LOWER EXTREMITY VENOUS DOPPLER ULTRASOUND TECHNIQUE: Gray-scale sonography with compression, as well as color and duplex ultrasound, were performed to evaluate the deep venous system(s) from the level of the common femoral vein through the popliteal and proximal calf veins.  COMPARISON: None Available. FINDINGS: VENOUS Normal compressibility of the common femoral, superficial femoral, and popliteal veins, as well as the visualized calf veins. Visualized portions of profunda femoral vein and great saphenous vein unremarkable. No filling defects to suggest DVT on grayscale or color Doppler imaging. Doppler waveforms show normal direction of venous flow, normal respiratory plasticity and response to augmentation. Limited views of the contralateral common femoral vein are unremarkable. OTHER None. Limitations: Somewhat limited visualization of the calf veins. IMPRESSION: No evidence of a deep venous thrombosis of either lower extremity. Electronically Signed By: Alm Parkins M.D. On: 07/17/2024 20:47   CT Angio Chest PE W and/or Wo Contrast Result Date: 07/17/2024 CLINICAL DATA: History of sarcoidosis. Short of breath with chest congestion and pain. EXAM: CT ANGIOGRAPHY CHEST WITH CONTRAST TECHNIQUE: Multidetector CT imaging of the chest was performed using the standard protocol during bolus administration of intravenous contrast. Multiplanar CT image reconstructions and MIPs were obtained to evaluate the vascular anatomy. RADIATION DOSE REDUCTION: This exam was performed according to the departmental dose-optimization program which includes automated exposure control, adjustment of the mA and/or kV according to patient size and/or use of iterative reconstruction technique. CONTRAST: 75mL OMNIPAQUE  IOHEXOL  350 MG/ML SOLN COMPARISON: 01/06/2023. FINDINGS: Cardiovascular: Pulmonary arteries to the proximal segmental level are well opacified. There is no evidence of a pulmonary embolism. Heart is normal in size. No pericardial effusion. Aorta is minimally opacified. Ascending aorta measures 4 cm in diameter. No convincing dissection. Stable atherosclerosis. Mediastinum/Nodes: No neck base, mediastinal or hilar masses. No  enlarged lymph nodes. Trachea and esophagus are unremarkable. Moderate  hiatal hernia. Lungs/Pleura: Secretions/mucus plugging noted in lower lobe bronchi, left greater than right. Trachea and bronchi are partly flattened consistent with the exam being expiratory in phase. There are linear and patchy opacities in the left lower lobe consistent with atelectasis, associated with volume loss. Remainder of the lungs is essentially clear. Trace left pleural effusion. No right pleural effusion. No pneumothorax. Upper Abdomen: No acute findings. Aortic atherosclerosis. Musculoskeletal: No fracture or acute finding. No bone lesion. Skeletal structures are demineralized. No chest wall mass. Review of the MIP images confirms the above findings. IMPRESSION: 1. No evidence of a pulmonary embolism. 2. Excretion/mucous plugging partly occluding lower lobe bronchi, greater on the left, on the left associated with atelectasis. No convincing pneumonia. No pulmonary edema. 3. Dilated ascending thoracic aorta to 4 cm. Recommend annual imaging followup by CTA or MRA. This recommendation follows 2010 ACCF/AHA/AATS/ACR/ASA/SCA/SCAI/SIR/STS/SVM Guidelines for the Diagnosis and Management of Patients with Thoracic Aortic Disease. Circulation. 2010; 121: Z733-z630. Aortic aneurysm NOS (ICD10-I71.9) 4. Aortic atherosclerosis. Aortic Atherosclerosis (ICD10-I70.0). Electronically Signed By: Alm Parkins M.D. On: 07/17/2024 18:29   DG Chest Portable 1 View Result Date: 07/17/2024 CLINICAL DATA: [name]. Cough. EXAM: PORTABLE CHEST 1 VIEW COMPARISON: 07/11/2024. CT 01/06/2023 FINDINGS: Cardiomegaly is stable. Retrocardiac hiatal hernia. Dense aortic atherosclerosis. Minor atelectasis at the left lung base. No confluent airspace disease. No pulmonary edema, pleural effusion or pneumothorax. IMPRESSION: 1. Stable cardiomegaly. No acute findings. 2. Hiatal hernia. Electronically Signed By: Andrea Gasman M.D. On: 07/17/2024 16:33    Past Medical History:   Past Medical History:  Diagnosis Date  .  Allergic rhinitis due to allergen   . Asthma without status asthmaticus (HHS-HCC)   . Coronary artery disease 2006   s/p angioplasty in 2006  . GERD (gastroesophageal reflux disease)   . Hypercholesteremia   . Hypertension   . Moderate aortic regurgitation    Normal cardiac evaluation in 2011  . Peripheral neuropathy, idiopathic   . Pulmonary hypertension (CMS/HHS-HCC)   . Rhinitis, chronic   . Sarcoidosis   . Sleep apnea    CPAP since 2013    Past Surgical History:   Past Surgical History:  Procedure Laterality Date  . Right total hip arthroplasty  2000   Dr Virgilio, [place]  . Left total hip arthroplasty  2007   Dr Medford Sharps, [facility]  . Right total knee arthroplasty using computer-assisted navigation  06/23/2017   Dr Mardee    Allergies:   Allergies  Allergen Reactions  . Simvastatin Muscle Pain  . Sulfa (Sulfonamide Antibiotics) Other (See Comments)    Pt doesn't remember reaction Allergy testing years ago  . Tramadol  Other (See Comments)    CONFUSION    Current Medications:   Prior to Admission medications  Medication Sig Taking? Last Dose  acetaminophen  (TYLENOL ) 500 MG tablet Take by mouth Yes Taking  carbidopa -levodopa  (SINEMET ) 25-100 mg tablet TAKE 1 TABLET BY MOUTH TWICE A DAY Yes Taking  clopidogreL  (PLAVIX ) 75 mg tablet TAKE 1 TABLET BY MOUTH EVERY DAY Yes Taking  ergocalciferol , vitamin D2, 1,250 mcg (50,000 unit) capsule TAKE 1 CAPSULE (50,000 UNITS TOTAL) BY MOUTH EVERY 7 (SEVEN) DAYS Yes Taking  fluticasone -umeclidinium-vilanterol (TRELEGY ELLIPTA) 100-62.5-25 mcg inhaler INHALE 1 PUFF INTO THE LUNGS ONCE DAILY. Yes Taking  FUROsemide  (LASIX ) 20 MG tablet TAKE 1 TABLET (20 MG TOTAL) BY MOUTH ONCE DAILY MAY TAKE EXTRA 20MG  FOR SWELLING Yes Taking  glycopyrrolate (ROBINUL) 1 mg tablet Take  1 tablet (1 mg total) by mouth once daily as needed (drooling) Yes Taking  ipratropium (ATROVENT) 21 mcg (0.03 %) nasal spray  Yes Taking   ipratropium-albuteroL  (DUO-NEB) nebulizer solution TAKE 3 MLS BY NEBULIZATION 4 (FOUR) TIMES DAILY AS NEEDED FOR WHEEZING Yes Taking  latanoprost  (XALATAN ) 0.005 % ophthalmic solution INSTILL 1 DROP BOTH EYES AT BEDTIME Yes Taking  metoprolol  SUCCinate (TOPROL -XL) 50 MG XL tablet Take 1 tablet (50 mg total) by mouth once daily Yes Taking  sennosides (SENNA) 8.6 mg tablet take 2 tablets by mouth twice a day Yes Taking  sertraline  (ZOLOFT ) 25 MG tablet Take 1 tablet (25 mg total) by mouth once daily      Family History:   Family History  Problem Relation Name Age of Onset  . Diabetes type II Mother    . Heart failure Sister    . Heart failure Brother    . Diabetes type II Sister      Social History:   Social History   Socioeconomic History  . Marital status: Widowed  . Number of children: 0  . Years of education: 58  Occupational History  . Occupation: Retired  Tobacco Use  . Smoking status: Never  . Smokeless tobacco: Never  Vaping Use  . Vaping status: Never Used  Substance and Sexual Activity  . Alcohol use: No    Alcohol/week: 0.0 standard drinks of alcohol  . Drug use: No    Comment: Prior cocaine and marijuana  . Sexual activity: Not Currently    Partners: Male  Social History Narrative   The patient lived in [place] for [place] years moved to [place] about 7 years ago to help care for an elderly parent. She has not significant environmental exposures. She is retired at this time.    Social Drivers of Corporate investment banker Strain: Low Risk  (07/29/2024)   Overall Financial Resource Strain (CARDIA)   . Difficulty of Paying Living Expenses: Not hard at all  Food Insecurity: No Food Insecurity (07/29/2024)   Hunger Vital Sign   . Worried About Programme researcher, broadcasting/film/video in the Last Year: Never true   . Ran Out of Food in the Last Year: Never true  Transportation Needs: No Transportation Needs (07/29/2024)   PRAPARE - Transportation   . Lack of Transportation  (Medical): No   . Lack of Transportation (Non-Medical): No  Physical Activity: Inactive (10/11/2021)   [facility]   Exercise Vital Sign   . On average, how many days per week do you engage in moderate to strenuous exercise (like a brisk walk)?: 0 days   . On average, how many minutes do you engage in exercise at this level?: 0 min  Stress: No Stress Concern Present (10/11/2021)   Received from [facility] - Occupational Stress Questionnaire   . Feeling of Stress : Only a little  Social Connections: Moderately Integrated (07/18/2024)   Received from [facility]   Social Connection and Isolation Panel   . In a typical week, how many times do you talk on the phone with family, friends, or neighbors?: More than three times a week   . How often do you get together with friends or relatives?: More than three times a week   . How often do you attend church or religious services?: More than 4 times per year   . How often do you attend meetings of the clubs or organizations  you belong to?: More than 4 times per year   . Are you married, widowed, divorced, separated, never married, or living with a partner?: Widowed  Housing Stability: Low Risk  (07/29/2024)   Housing Stability Vital Sign   . Unable to Pay for Housing in the Last Year: No   . Number of Times Moved in the Last Year: 0   . Homeless in the Last Year: No    Review of Systems:   A 10 point review of systems is negative, except for the pertinent positives and negatives detailed in the HPI.  Vitals:   Vitals:   07/29/24 1413  BP: 124/82  Pulse: 66  SpO2: 97%  Weight: 84.8 kg (187 lb)  Height: 162.6 cm (5' 4)     Body mass index is 32.1 kg/m.  Physical Exam:   Physical Exam Vitals and nursing note reviewed.  Constitutional:      General: She is not in acute distress.    Appearance: Normal appearance. She is not ill-appearing, toxic-appearing or diaphoretic.   HENT:     Head: Normocephalic and atraumatic.     Right Ear: External ear normal.     Left Ear: External ear normal.  Eyes:     General:        Right eye: No discharge.        Left eye: No discharge.     Conjunctiva/sclera: Conjunctivae normal.  Cardiovascular:     Rate and Rhythm: Normal rate and regular rhythm.     Pulses: Normal pulses.     Heart sounds: Normal heart sounds. No murmur heard.    No friction rub. No gallop.  Pulmonary:     Effort: Pulmonary effort is normal. No respiratory distress.     Breath sounds: Normal breath sounds. No stridor. No wheezing, rhonchi or rales.  Chest:     Chest wall: No tenderness.  Skin:    General: Skin is warm and dry.     Capillary Refill: Capillary refill takes less than 2 seconds.  Neurological:     Mental Status: She is alert.     Assessment and Plan:   Results for orders placed or performed in visit on 07/29/24  Comprehensive Metabolic Panel (CMP)  Result Value Ref Range   Glucose 84 70 - 110 mg/dL   Sodium 860 863 - 854 mmol/L   Potassium 4.4 3.6 - 5.1 mmol/L   Chloride 107 97 - 109 mmol/L   Carbon Dioxide (CO2) 26.4 22.0 - 32.0 mmol/L   Urea Nitrogen (BUN) 22 7 - 25 mg/dL   Creatinine 1.0 0.6 - 1.1 mg/dL   Glomerular Filtration Rate (eGFR) 51 (L) >60 mL/min/1.73sq m   Calcium 8.9 8.7 - 10.3 mg/dL   AST  13 8 - 39 U/L   ALT  4 (L) 5 - 38 U/L   Alk Phos (alkaline Phosphatase) 106 (H) 34 - 104 U/L   Albumin 3.9 3.5 - 4.8 g/dL   Bilirubin, Total 0.7 0.3 - 1.2 mg/dL   Protein, Total 6.7 6.1 - 7.9 g/dL   A/G Ratio 1.4 1.0 - 5.0 gm/dL  Magnesium   Result Value Ref Range   Magnesium  2.0 1.8 - 2.5 mg/dL  B-type natriuretic peptide (BNP)  Result Value Ref Range   BNP (Brain Natriuretic Peptide) 137.0 (H) <=100.0 pg/mL  ECG 12-lead  Result Value Ref Range   Vent Rate (bpm) 66    PR Interval (msec) 160    QRS Interval (msec) 124  QT Interval (msec) 424    QTc (msec) 444     Diagnoses and all orders for this  visit:  Chest pain at rest -     ECG 12-lead  Screening for tuberculosis -     QuantiFERON-TB Gold Plus - [facility]  Primary hypertension  Coronary artery disease involving native coronary artery of native heart without angina pectoris  Pulmonary hypertension associated with sarcoidosis (CMS/HHS-HCC)  Chronic diastolic heart failure (CMS/HHS-HCC) -     CBC w/auto Differential (5 Part) -     Comprehensive Metabolic Panel (CMP) -     Magnesium  -     B-type natriuretic peptide (BNP); Future  Hypertensive heart and chronic kidney disease with heart failure and stage 1 through stage 4 chronic kidney disease, or unspecified chronic kidney disease (CMS/HHS-HCC) -     CBC w/auto Differential (5 Part) -     Comprehensive Metabolic Panel (CMP) -     Magnesium   [name] -     CBC w/auto Differential (5 Part) -     Comprehensive Metabolic Panel (CMP) -     Magnesium  -     B-type natriuretic peptide (BNP); Future  Dyslipidemia, unspecified  Other orders -     sertraline  (ZOLOFT ) 25 MG tablet; Take 1 tablet (25 mg total) by mouth once daily    Assessment & Plan [name] with mucus plugging Intermittent shortness of breath with mucus plugging in the lower lobes. No evidence of blood clots or pneumonia on recent chest CT. Oxygen saturation is excellent today at 97%. - Encourage deep breathing exercises using incentive spirometer - Advise to angle herself up with two pillows when lying down to facilitate mucus drainage  Volume overload on diuretics and fluid restriction Volume overload managed with Lasix  and fluid restriction to 1.5 liters per day. Recent weight loss of approximately 8 pounds, likely fluid loss. BNP was elevated at 380, indicating fluid overload. - Check BNP and kidney function today - Continue Lasix  as prescribed - Maintain fluid restriction to 1.5 liters per day  Impaired mobility and need for assistance with activities of daily living Impaired  mobility requiring assistance with activities of daily living. Physical therapy notes indicate need for significant help with travel, sit to stand, and other daily activities. Home health visits are in place but limited. Discussion about potential rehab placement and insurance coverage challenges. - Fill out FL2 form for potential rehab placement - Check labs today including TB test for rehab requirements  Anxiety disorder Anxiety disorder not well managed with current medication (escitalopram ). - Prescribe Zoloft  (sertraline ) 25 mg, start with 12.5 mg if needed  Pre-diabetes A1c is 5.7, indicating pre-diabetes.   Follow up She will keep previously scheduled appointment with PCP, sooner if needed   This note has been created using automated tools and reviewed for accuracy by provider.  Patient received an After Visit Summary    Attestation Statement:   I personally performed the service, non-incident to. (WP)   GLENDA MACARIO HADDOCK, NP

## 2024-07-30 NOTE — Telephone Encounter (Signed)
 Spoke with patient.

## 2024-08-17 NOTE — Progress Notes (Deleted)
  Cardiology Office Note   Date:  08/17/2024  ID:  Keymora Murin, DOB [birth year], MRN [id number] PCP: [name], MD  [facility] Providers Cardiologist:  None { Click to update primary MD,subspecialty MD or APP then REFRESH:1}    History of Present Illness Shirin Echeverry is a 89 y.o. female PMH TAA, aortic atherosclerosis, reported HFpEF, PAD, HLD, HTN who presents for further evaluation and management of shortness of breath and chest pain.  Patient was hospitalized 06/2024 for several days and treated for a HFpEF exacerbation.  ***.  Last LDL 147 08/2023.  Relevant CVD History - TAA measuring 4 cm on CTPA 06/2024; severe aortic atherosclerosis present - TTE 02/2024 LVEF 60 to 65%, grade 2 diastolic dysfunction, mildly dilated LA, severe MR, mild to moderate TR - moderately reduced ABI/TBI of LLE 09/2021   ROS: Pt denies any chest discomfort, jaw pain, arm pain, palpitations, syncope, presyncope, orthopnea, PND, or LE edema.  Studies Reviewed I have independently reviewed the patient's ECG, ***.  Physical Exam VS:  There were no vitals taken for this visit.       Wt Readings from Last 3 Encounters:  07/20/24 183 lb 13.8 oz (83.4 kg)  03/11/24 187 lb 2.7 oz (84.9 kg)  02/13/24 184 lb 15.5 oz (83.9 kg)    GEN: No acute distress. NECK: No JVD; No carotid bruits. CARDIAC: ***RRR, no murmurs, rubs, gallops. RESPIRATORY:  Clear to auscultation. EXTREMITIES:  Warm and well-perfused. No edema.  ASSESSMENT AND PLAN DOE Chest discomfort HFpEF Severe MR PAD TAA Aortic atherosclerosis        {Are you ordering a CV Procedure (e.g. stress test, cath, DCCV, TEE, etc)?   Press F2        :[id number]}  Dispo: ***  Signed, Caron Poser, MD

## 2024-08-19 ENCOUNTER — Ambulatory Visit

## 2024-09-27 NOTE — Progress Notes (Unsigned)
 Cardiology Office Note   Date:  09/28/2024  ID:  Sheila Long, DOB 1924-01-19, MRN [id number] PCP: [name], MD  [facility] Providers Cardiologist:  Caron Poser, MD     History of Present Illness Sheila Long is a 89 y.o. female PMH HTN, HLD, Parkinson disease who presents for further evaluation and management of DOE.  Patient reports exertional DOE and intermittent chest discomfort.  She also notes intermittent leg swelling.  She is still able to do basic things around the house, but is certainly limited.  She had an echo back in 03/18/2024 at an outside facility that showed severe MR.  We discussed that if she does indeed have severe valvular disease, she is almost certainly not a candidate for any surgical invention, and may not be a great candidate for MitraClip.  Relevant CVD History -CTPA 06/2024 without PE, dilated ascending aorta 4 cm, aortic atherosclerosis, CAC -TTE 02/2024 normal LV function with grade 2 diastolic dysfunction, severe MR, mild to moderate TR, mild AI. -TTE 06/02/2022 normal LV function with grade 1 diastolic dysfunction, mild MR, trivial AI   ROS: Pt denies any jaw pain, arm pain, palpitations, syncope, presyncope.  Studies Reviewed I have independently reviewed the patient's ECG, prior cardiac testing, recent blood work, previous medical record.  Physical Exam VS:  BP 108/62 (BP Location: Left Arm, Patient Position: Sitting, Cuff Size: Normal)   Pulse 72   Ht 5' 4 (1.626 m)   Wt 189 lb (85.7 kg)   SpO2 96%   BMI 32.44 kg/m        Wt Readings from Last 3 Encounters:  09/28/24 189 lb (85.7 kg)  07/20/24 183 lb 13.8 oz (83.4 kg)  03/11/24 187 lb 2.7 oz (84.9 kg)    GEN: No acute distress. NECK: No JVD; No carotid bruits. CARDIAC: RRR, no murmurs, rubs, gallops. RESPIRATORY:  Clear to auscultation. EXTREMITIES:  Warm and well-perfused.  +1 edema bilaterally.  ASSESSMENT AND PLAN Chest discomfort DOE Severe  MR Patient presents with NYHA II-III symptoms and a prior echocardiogram 02/2024 with severe MR.  Based on the report, she did not have dilated chambers or reduced LV function.  She has trace edema in office today, but is warm and perfusing.  She does not have any rales on exam.  Given her age/frailty/multi morbidity, if she does indeed have severe MR, I do not know if she would be a great candidate for intervention (certainly not surgical).  Plan: - Repeat echocardiogram to see if she really does have severe MR and any signs of dilated chamber size, reduced LV function, etc. - Since she does not have any rales, we will plan to keep her Lasix  dose as is.  Continue with 40 mg daily.  I advised her to take daily weights and call the office for a weight gain greater than 3 pounds in 1 day or 5 pounds in 1 week at which point we would increase her Lasix  dose.  Will need to do this gently given her age and frailty - If she does have severe MR and wants to see our structural team for a consult, we will facilitate.  I did discuss with her that she may or may not be a candidate for MitraClip given her age. - If she does not have severe MR, then we will continue with HFpEF treatments.  We can add SGLT2 inhibitor to her regimen if that is indeed the case if she is amenable.  TAA Aortic atherosclerosis Mildly dilated  TAA at 4 cm from CT scan 06/2024.  Her blood pressure is well-controlled.  Given her age, we will continue with conservative management.       Dispo: RTC 3 months or sooner as needed  Signed, Caron Poser, MD

## 2024-09-28 ENCOUNTER — Ambulatory Visit

## 2024-09-28 VITALS — BP 108/62 | HR 72 | Ht 64.0 in | Wt 189.0 lb

## 2024-09-28 DIAGNOSIS — R0609 Other forms of dyspnea: Secondary | ICD-10-CM

## 2024-09-28 DIAGNOSIS — R079 Chest pain, unspecified: Secondary | ICD-10-CM | POA: Diagnosis not present

## 2024-09-28 DIAGNOSIS — I340 Nonrheumatic mitral (valve) insufficiency: Secondary | ICD-10-CM | POA: Diagnosis not present

## 2024-09-28 NOTE — Patient Instructions (Addendum)
 Medication Instructions:   Your physician recommends that you continue on your current medications as directed. Please refer to the Current Medication list given to you today.   Weigh yourself daily first thing in the morning after going to restroom; with same amount of clothing (or no clothing) :  If you have swelling in legs or shortness of breath accompanied by a weight gain of 3 pounds or more over night - Take an extra Lasix  40 mg dose (total 80 mg)  OR If you have swelling in legs or shortness of breath accompanied by a weight gain of 5 pounds or more in a week - Take an extra Lasix  40 mg dose (total 80 mg)   *If you need a refill on your cardiac medications before your next appointment, please call your pharmacy*  Lab Work: No labs ordered today  If you have labs (blood work) drawn today and your tests are completely normal, you will receive your results only by: MyChart Message (if you have MyChart) OR A paper copy in the mail If you have any lab test that is abnormal or we need to change your treatment, we will call you to review the results.  Testing/Procedures: Your physician has requested that you have an echocardiogram. Echocardiography is a painless test that uses sound waves to create images of your heart. It provides your doctor with information about the size and shape of your heart and how well your heart's chambers and valves are working.   You may receive an ultrasound enhancing agent through an IV if needed to better visualize your heart during the echo. This procedure takes approximately one hour.  There are no restrictions for this procedure.  This will take place at [address] [facility], [place] [address]  Please note: We ask at that you not bring children with you during ultrasound (echo/ vascular) testing. Due to room size and safety concerns, children are not allowed in the ultrasound rooms during exams. Our front office staff cannot  provide observation of children in our lobby area while testing is being conducted. An adult accompanying a patient to their appointment will only be allowed in the ultrasound room at the discretion of the ultrasound technician under special circumstances. We apologize for any inconvenience.   Follow-Up: At [facility], you and your health needs are our priority.  As part of our continuing mission to provide you with exceptional heart care, our providers are all part of one team.  This team includes your primary Cardiologist (physician) and Advanced Practice Providers or APPs (Physician Assistants and Nurse Practitioners) who all work together to provide you with the care you need, when you need it.  Your next appointment:   3 month(s)  Provider:  Caron Poser, MD   We recommend signing up for the patient portal called MyChart.  Sign up information is provided on this After Visit Summary.  MyChart is used to connect with patients for Virtual Visits (Telemedicine).  Patients are able to view lab/test results, encounter notes, upcoming appointments, etc.  Non-urgent messages can be sent to your provider as well.   To learn more about what you can do with MyChart, go to [email or web address].

## 2024-11-11 ENCOUNTER — Ambulatory Visit

## 2024-11-11 DIAGNOSIS — I340 Nonrheumatic mitral (valve) insufficiency: Secondary | ICD-10-CM

## 2024-11-11 DIAGNOSIS — R0609 Other forms of dyspnea: Secondary | ICD-10-CM

## 2024-11-11 LAB — ECHOCARDIOGRAM COMPLETE
AR max vel: 2.42 cm2
AV Area VTI: 2.59 cm2
AV Area mean vel: 2.37 cm2
AV Mean grad: 2.5 mmHg
AV Peak grad: 4.9 mmHg
AV Vena cont: 0.4 cm
Ao pk vel: 1.11 m/s
Area-P 1/2: 3.31 cm2
MV M vel: 5.53 m/s
MV Peak grad: 122.5 mmHg
MV VTI: 2.09 cm2
Radius: 0.47 cm
S' Lateral: 2.6 cm

## 2024-11-16 ENCOUNTER — Ambulatory Visit: Payer: Self-pay

## 2024-12-20 ENCOUNTER — Ambulatory Visit

## 2024-12-20 VITALS — BP 165/81 | HR 65 | Ht 64.0 in | Wt 182.6 lb

## 2024-12-20 DIAGNOSIS — I700 Atherosclerosis of aorta: Secondary | ICD-10-CM

## 2024-12-20 DIAGNOSIS — I340 Nonrheumatic mitral (valve) insufficiency: Secondary | ICD-10-CM | POA: Diagnosis not present

## 2024-12-20 DIAGNOSIS — I5032 Chronic diastolic (congestive) heart failure: Secondary | ICD-10-CM | POA: Diagnosis not present

## 2024-12-20 DIAGNOSIS — I7121 Aneurysm of the ascending aorta, without rupture: Secondary | ICD-10-CM

## 2024-12-20 NOTE — Progress Notes (Signed)
" °  Cardiology Office Note   Date:  12/20/2024  ID:  Sheila Long, DOB [birth year], MRN [id number] PCP: Sheila Bail, MD  [facility] Providers Cardiologist:  Sheila Poser, MD     History of Present Illness Sheila Long is a 89 y.o. female PMH HTN, HLD, Parkinson disease who presents for further evaluation and management of DOE.  Patient reports exertional DOE and intermittent chest discomfort.  She also notes intermittent leg swelling.  She is still able to do basic things around the house, but is certainly limited.  She had an echo back in 03/18/2024 at an outside facility that showed severe MR.  We discussed that if she does indeed have severe valvular disease, she is almost certainly not a candidate for any surgical invention, and may not be a great candidate for MitraClip.  Interval history: Since last visit, patient is doing about the same.  She has not required any increases in diuretic or any hospitalizations.  She occasionally has lower extremity edema which goes away with her Lasix .  She denies any other symptoms today.  Relevant CVD History -TTE 10/2024 LVEF 55 to 60%, grade 1 diastolic dysfunction, moderate to severe MR with bileaflet prolapse, mild AR -CTPA 06/2024 without PE, dilated ascending aorta 4 cm, aortic atherosclerosis, CAC -TTE 02/2024 normal LV function with grade 2 diastolic dysfunction, severe MR, mild to moderate TR, mild AI. -TTE 06/02/2022 normal LV function with grade 1 diastolic dysfunction, mild MR, trivial AI   ROS: Pt denies any jaw pain, arm pain, palpitations, syncope, presyncope.  Studies Reviewed I have independently reviewed the patient's ECG, prior cardiac testing, recent blood work, previous medical record.  Physical Exam VS:  BP (!) 165/81 (BP Location: Left Arm, Patient Position: Sitting, Cuff Size: Normal)   Pulse 65 Comment: 70 oximeter  Ht 5' 4 (1.626 m)   Wt 182 lb 9.6 oz (82.8 kg)   SpO2 98%   BMI 31.34 kg/m         Wt Readings from Last 3 Encounters:  12/20/24 182 lb 9.6 oz (82.8 kg)  09/28/24 189 lb (85.7 kg)  07/20/24 183 lb 13.8 oz (83.4 kg)    GEN: No acute distress. NECK: No JVD; No carotid bruits. CARDIAC: RRR, no murmurs, rubs, gallops. RESPIRATORY:  Clear to auscultation. EXTREMITIES:  Warm and well-perfused.  No edema  ASSESSMENT AND PLAN Chest discomfort DOE Moderate-Severe MR Patient presents with NYHA II-III symptoms and a echocardiogram 10/2024 with mod-severe MR. LV function and end-systolic diameter are normal. She does not have any rales or edema on exam.  Given her age/frailty/multi morbidity, I do do not think she is a good candidate for intervention (certainly not surgical) should she develop severe MR with symptoms, reduced EF, etc.  Plan: - Since she does not have any rales, we will plan to keep her Lasix  dose as is.  Continue with 40 mg daily.  I advised her to take daily weights and call the office for a weight gain greater than 3 pounds in 1 day or 5 pounds in 1 week at which point we would increase her Lasix  dose.   TAA Aortic atherosclerosis Mildly dilated TAA at 4 cm from CT scan 06/2024.  Her blood pressure is well-controlled.  Given her age, we will continue with conservative management/surveillance.       Dispo: RTC 6 months or sooner as needed  Signed, Sheila Poser, MD  "

## 2024-12-20 NOTE — Patient Instructions (Signed)
 Medication Instructions:  Your physician recommends that you continue on your current medications as directed. Please refer to the Current Medication list given to you today.   *If you need a refill on your cardiac medications before your next appointment, please call your pharmacy*  Lab Work: None ordered at this time   Follow-Up: At [facility], you and your health needs are our priority.  As part of our continuing mission to provide you with exceptional heart care, our providers are all part of one team.  This team includes your primary Cardiologist (physician) and Advanced Practice Providers or APPs (Physician Assistants and Nurse Practitioners) who all work together to provide you with the care you need, when you need it.  Your next appointment:   6 month(s)  Provider:   You may see Caron Poser, MD or one of the following Advanced Practice Providers on your designated Care Team:   Lonni Meager, NP Lesley Maffucci, PA-C Bernardino Bring, PA-C Cadence Vader, PA-C Tylene Lunch, NP Barnie Hila, NP    We recommend signing up for the patient portal called MyChart.  Sign up information is provided on this After Visit Summary.  MyChart is used to connect with patients for Virtual Visits (Telemedicine).  Patients are able to view lab/test results, encounter notes, upcoming appointments, etc.  Non-urgent messages can be sent to your provider as well.   To learn more about what you can do with MyChart, go to [email or web address].
